# Patient Record
Sex: Male | Born: 1940 | Race: White | Hispanic: No | Marital: Single | Smoking: Former smoker
Health system: Southern US, Community
[De-identification: ages and names within clinical notes are randomized; demographics above are authoritative.]

## PROBLEM LIST (undated history)

## (undated) DIAGNOSIS — I219 Acute myocardial infarction, unspecified: Secondary | ICD-10-CM

## (undated) DIAGNOSIS — L814 Other melanin hyperpigmentation: Secondary | ICD-10-CM

## (undated) DIAGNOSIS — J189 Pneumonia, unspecified organism: Secondary | ICD-10-CM

## (undated) DIAGNOSIS — F32A Depression, unspecified: Secondary | ICD-10-CM

## (undated) DIAGNOSIS — I6529 Occlusion and stenosis of unspecified carotid artery: Secondary | ICD-10-CM

## (undated) DIAGNOSIS — I517 Cardiomegaly: Secondary | ICD-10-CM

## (undated) DIAGNOSIS — F329 Major depressive disorder, single episode, unspecified: Secondary | ICD-10-CM

## (undated) DIAGNOSIS — E785 Hyperlipidemia, unspecified: Secondary | ICD-10-CM

## (undated) DIAGNOSIS — I2510 Atherosclerotic heart disease of native coronary artery without angina pectoris: Secondary | ICD-10-CM

## (undated) DIAGNOSIS — D649 Anemia, unspecified: Secondary | ICD-10-CM

## (undated) DIAGNOSIS — R29898 Other symptoms and signs involving the musculoskeletal system: Secondary | ICD-10-CM

## (undated) HISTORY — PX: ABDOMINAL AORTAGRAM: SHX5706

## (undated) HISTORY — PX: COLONOSCOPY: SHX174

## (undated) HISTORY — DX: Occlusion and stenosis of unspecified carotid artery: I65.29

## (undated) HISTORY — DX: Hyperlipidemia, unspecified: E78.5

## (undated) HISTORY — DX: Acute myocardial infarction, unspecified: I21.9

---

## 1997-09-07 ENCOUNTER — Inpatient Hospital Stay (HOSPITAL_COMMUNITY): Admission: EM | Admit: 1997-09-07 | Discharge: 1997-09-11 | Payer: Self-pay | Admitting: Emergency Medicine

## 1997-09-19 ENCOUNTER — Ambulatory Visit (HOSPITAL_COMMUNITY): Admission: RE | Admit: 1997-09-19 | Discharge: 1997-09-19 | Payer: Self-pay | Admitting: Gastroenterology

## 1998-11-09 ENCOUNTER — Ambulatory Visit: Admission: RE | Admit: 1998-11-09 | Discharge: 1998-11-09 | Payer: Self-pay | Admitting: Pulmonary Disease

## 1998-11-14 ENCOUNTER — Ambulatory Visit (HOSPITAL_COMMUNITY): Admission: RE | Admit: 1998-11-14 | Discharge: 1998-11-14 | Payer: Self-pay | Admitting: Cardiology

## 1998-11-14 ENCOUNTER — Encounter: Payer: Self-pay | Admitting: Cardiology

## 1999-01-19 ENCOUNTER — Ambulatory Visit (HOSPITAL_COMMUNITY): Admission: RE | Admit: 1999-01-19 | Discharge: 1999-01-19 | Payer: Self-pay | Admitting: Neurosurgery

## 1999-01-19 ENCOUNTER — Encounter: Payer: Self-pay | Admitting: Specialist

## 1999-10-14 ENCOUNTER — Ambulatory Visit (HOSPITAL_COMMUNITY): Admission: RE | Admit: 1999-10-14 | Discharge: 1999-10-14 | Payer: Self-pay | Admitting: Urology

## 2006-12-09 ENCOUNTER — Emergency Department (HOSPITAL_COMMUNITY): Admission: EM | Admit: 2006-12-09 | Discharge: 2006-12-09 | Payer: Self-pay | Admitting: Emergency Medicine

## 2010-03-18 ENCOUNTER — Encounter: Payer: Self-pay | Admitting: Internal Medicine

## 2010-07-12 NOTE — Op Note (Signed)
[place]  Patient:    Daniel Tyler, Daniel Tyler                       MRN: [id number] Proc. Date: 10/14/99 Adm. Date:  [id number] Disc. Date: [id number] Attending:  Liborio Nixon                           Operative Report  PREOPERATIVE DIAGNOSIS:  Phimosis.  POSTOPERATIVE DIAGNOSIS:  Phimosis.  PROCEDURE:  Circumcision.  SURGEON:  Bertram Millard. Dahlstedt, [name]  ANESTHESIA:  MAC with local.  BRIEF HISTORY:  70 year old male with recurrent balanitis and resultant phimosis.  He cannot retract his foreskin now and has significant difficulty with pain and irritation.  The patient was referred to my office and I recommended circumcision as primary treatment for his phimosis and recurrent balanitis.  Risks and complications as well as alternatives to this procedure were discussed.  The patient desires to proceed.  DESCRIPTION OF PROCEDURE:  The patient was administered IV sedation and dorsal penile block using 20 cc of 50/50 solution of 1% Plain Lidocaine and .5% Plain Marcaine was introduced.  An excellent block was achieved.  A dorsal slit was performed to be able to retract his foreskin.  Proximal and distal circumcising incisions were made on the patients foreskin and the foreskin removed.  Small bleeders were taken care of with electrocautery.  A U-stitch was placed in the ventral aspect of the circumcision reapproximating the ventral incision.  Quadrant sutures of 4-0 chromic were also placed in simple interrupted fashion.  Between these, the 4-0 chromic was run in a simple running fashion to reapproximate the incision.  Adequate hemostasis was achieved.  A dressing of Vaseline gauze, Kerlix and Coban was placed.  The patient tolerated the procedure well.  Estimated blood loss was 10 cc.  He was taken to the post anesthesia care unit in stable condition. DD:  10/14/99 TD:  10/14/99 Job: [id number]

## 2010-08-20 ENCOUNTER — Other Ambulatory Visit: Payer: Self-pay | Admitting: Family Medicine

## 2010-08-20 DIAGNOSIS — R55 Syncope and collapse: Secondary | ICD-10-CM

## 2010-12-04 LAB — DIFFERENTIAL
Basophils Absolute: 0.1
Basophils Relative: 1
Eosinophils Absolute: 0.2
Eosinophils Relative: 2
Monocytes Absolute: 0.8 — ABNORMAL HIGH
Neutro Abs: 9.9 — ABNORMAL HIGH

## 2010-12-04 LAB — I-STAT 8, (EC8 V) (CONVERTED LAB)
Bicarbonate: 21.5
Glucose, Bld: 218 — ABNORMAL HIGH
Hemoglobin: 15
Operator id: 270111
Sodium: 136
TCO2: 23

## 2010-12-04 LAB — POCT I-STAT CREATININE
Creatinine, Ser: 0.8
Operator id: 270111

## 2010-12-04 LAB — CBC
Hemoglobin: 13.7
MCHC: 34.4
RDW: 13.9

## 2011-02-25 DIAGNOSIS — I219 Acute myocardial infarction, unspecified: Secondary | ICD-10-CM

## 2011-02-25 DIAGNOSIS — J189 Pneumonia, unspecified organism: Secondary | ICD-10-CM

## 2011-02-25 HISTORY — PX: CARDIAC CATHETERIZATION: SHX172

## 2011-02-25 HISTORY — DX: Pneumonia, unspecified organism: J18.9

## 2011-02-25 HISTORY — DX: Acute myocardial infarction, unspecified: I21.9

## 2011-04-14 ENCOUNTER — Other Ambulatory Visit: Payer: Self-pay

## 2011-04-14 ENCOUNTER — Encounter (HOSPITAL_COMMUNITY): Payer: Self-pay

## 2011-04-14 ENCOUNTER — Emergency Department (HOSPITAL_COMMUNITY): Payer: Medicare Other

## 2011-04-14 ENCOUNTER — Inpatient Hospital Stay (HOSPITAL_COMMUNITY)
Admission: EM | Admit: 2011-04-14 | Discharge: 2011-04-26 | DRG: 871 | Disposition: A | Discharge status: Skilled Nursing Facility | Payer: Medicare Other | Attending: Family Medicine | Admitting: Family Medicine

## 2011-04-14 DIAGNOSIS — E46 Unspecified protein-calorie malnutrition: Secondary | ICD-10-CM | POA: Diagnosis present

## 2011-04-14 DIAGNOSIS — R197 Diarrhea, unspecified: Secondary | ICD-10-CM | POA: Diagnosis present

## 2011-04-14 DIAGNOSIS — R296 Repeated falls: Secondary | ICD-10-CM | POA: Diagnosis present

## 2011-04-14 DIAGNOSIS — E876 Hypokalemia: Secondary | ICD-10-CM | POA: Diagnosis present

## 2011-04-14 DIAGNOSIS — I214 Non-ST elevation (NSTEMI) myocardial infarction: Secondary | ICD-10-CM | POA: Diagnosis not present

## 2011-04-14 DIAGNOSIS — I2510 Atherosclerotic heart disease of native coronary artery without angina pectoris: Secondary | ICD-10-CM | POA: Diagnosis present

## 2011-04-14 DIAGNOSIS — Z794 Long term (current) use of insulin: Secondary | ICD-10-CM

## 2011-04-14 DIAGNOSIS — J4489 Other specified chronic obstructive pulmonary disease: Secondary | ICD-10-CM | POA: Diagnosis present

## 2011-04-14 DIAGNOSIS — E119 Type 2 diabetes mellitus without complications: Secondary | ICD-10-CM | POA: Diagnosis present

## 2011-04-14 DIAGNOSIS — Y92009 Unspecified place in unspecified non-institutional (private) residence as the place of occurrence of the external cause: Secondary | ICD-10-CM

## 2011-04-14 DIAGNOSIS — I509 Heart failure, unspecified: Secondary | ICD-10-CM | POA: Diagnosis present

## 2011-04-14 DIAGNOSIS — J690 Pneumonitis due to inhalation of food and vomit: Secondary | ICD-10-CM | POA: Diagnosis present

## 2011-04-14 DIAGNOSIS — D649 Anemia, unspecified: Secondary | ICD-10-CM | POA: Diagnosis present

## 2011-04-14 DIAGNOSIS — J449 Chronic obstructive pulmonary disease, unspecified: Secondary | ICD-10-CM | POA: Diagnosis present

## 2011-04-14 DIAGNOSIS — A419 Sepsis, unspecified organism: Principal | ICD-10-CM | POA: Diagnosis present

## 2011-04-14 DIAGNOSIS — E118 Type 2 diabetes mellitus with unspecified complications: Secondary | ICD-10-CM

## 2011-04-14 DIAGNOSIS — R5381 Other malaise: Secondary | ICD-10-CM | POA: Diagnosis present

## 2011-04-14 DIAGNOSIS — M6282 Rhabdomyolysis: Secondary | ICD-10-CM

## 2011-04-14 DIAGNOSIS — J189 Pneumonia, unspecified organism: Secondary | ICD-10-CM | POA: Diagnosis present

## 2011-04-14 DIAGNOSIS — R531 Weakness: Secondary | ICD-10-CM

## 2011-04-14 DIAGNOSIS — Y998 Other external cause status: Secondary | ICD-10-CM

## 2011-04-14 DIAGNOSIS — R739 Hyperglycemia, unspecified: Secondary | ICD-10-CM

## 2011-04-14 DIAGNOSIS — I2589 Other forms of chronic ischemic heart disease: Secondary | ICD-10-CM | POA: Diagnosis present

## 2011-04-14 DIAGNOSIS — I498 Other specified cardiac arrhythmias: Secondary | ICD-10-CM | POA: Diagnosis present

## 2011-04-14 DIAGNOSIS — J9600 Acute respiratory failure, unspecified whether with hypoxia or hypercapnia: Secondary | ICD-10-CM | POA: Diagnosis present

## 2011-04-14 DIAGNOSIS — R748 Abnormal levels of other serum enzymes: Secondary | ICD-10-CM

## 2011-04-14 DIAGNOSIS — M542 Cervicalgia: Secondary | ICD-10-CM | POA: Diagnosis present

## 2011-04-14 HISTORY — DX: Cardiomegaly: I51.7

## 2011-04-14 LAB — INFLUENZA PANEL BY PCR (TYPE A & B)
H1N1 flu by pcr: NOT DETECTED
Influenza A By PCR: NEGATIVE
Influenza B By PCR: NEGATIVE

## 2011-04-14 LAB — CARDIAC PANEL(CRET KIN+CKTOT+MB+TROPI)
CK, MB: 4.7 ng/mL — ABNORMAL HIGH (ref 0.3–4.0)
Relative Index: 0.4 (ref 0.0–2.5)
Relative Index: 0.3 (ref 0.0–2.5)
Total CK: 1466 U/L — ABNORMAL HIGH (ref 7–232)
Troponin I: 2.15 ng/mL

## 2011-04-14 LAB — BASIC METABOLIC PANEL
BUN: 24 mg/dL — ABNORMAL HIGH (ref 6–23)
CO2: 20 mEq/L (ref 19–32)
Chloride: 90 mEq/L — ABNORMAL LOW (ref 96–112)
Creatinine, Ser: 0.99 mg/dL (ref 0.50–1.35)

## 2011-04-14 LAB — CBC
HCT: 38.9 % — ABNORMAL LOW (ref 39.0–52.0)
HCT: 34.1 % — ABNORMAL LOW (ref 39.0–52.0)
Hemoglobin: 11.5 g/dL — ABNORMAL LOW (ref 13.0–17.0)
MCH: 29.2 pg (ref 26.0–34.0)
MCHC: 33.7 g/dL (ref 30.0–36.0)
MCV: 86.8 fL (ref 78.0–100.0)
MCV: 86.5 fL (ref 78.0–100.0)
Platelets: 266 K/uL (ref 150–400)
RBC: 4.48 MIL/uL (ref 4.22–5.81)
RBC: 3.94 MIL/uL — ABNORMAL LOW (ref 4.22–5.81)
RDW: 13.9 % (ref 11.5–15.5)
WBC: 10.6 10*3/uL — ABNORMAL HIGH (ref 4.0–10.5)
WBC: 9 K/uL (ref 4.0–10.5)

## 2011-04-14 LAB — CREATININE, SERUM
Creatinine, Ser: 0.86 mg/dL (ref 0.50–1.35)
GFR calc Af Amer: >90 mL/min
GFR calc non Af Amer: 86 mL/min — ABNORMAL LOW

## 2011-04-14 LAB — POCT I-STAT 3, ART BLOOD GAS (G3+)
Bicarbonate: 17.7 mEq/L — ABNORMAL LOW (ref 20.0–24.0)
TCO2: 18 mmol/L (ref 0–100)
pCO2 arterial: 27.5 mmHg — ABNORMAL LOW (ref 35.0–45.0)
pH, Arterial: 7.416 (ref 7.350–7.450)
pO2, Arterial: 80 mmHg (ref 80.0–100.0)

## 2011-04-14 LAB — GLUCOSE, CAPILLARY: Glucose-Capillary: 479 mg/dL — ABNORMAL HIGH (ref 70–99)

## 2011-04-14 LAB — URINE MICROSCOPIC-ADD ON

## 2011-04-14 LAB — URINALYSIS, ROUTINE W REFLEX MICROSCOPIC
Glucose, UA: >1000 mg/dL — AB
Protein, ur: 100 mg/dL — AB

## 2011-04-14 LAB — PROCALCITONIN: Procalcitonin: 29.92 ng/mL

## 2011-04-14 MED ORDER — INSULIN GLARGINE 100 UNIT/ML ~~LOC~~ SOLN
5.0000 [IU] | Freq: Every day | SUBCUTANEOUS | Status: DC
  Administered 2011-04-14: 5 [IU] via SUBCUTANEOUS
  Filled 2011-04-14: qty 1

## 2011-04-14 MED ORDER — PIPERACILLIN-TAZOBACTAM 3.375 G IVPB
3.3750 g | Freq: Three times a day (TID) | INTRAVENOUS | Status: DC
  Administered 2011-04-15 – 2011-04-19 (×13): 3.375 g via INTRAVENOUS
  Filled 2011-04-14 (×16): qty 50

## 2011-04-14 MED ORDER — VANCOMYCIN HCL IN DEXTROSE 1-5 GM/200ML-% IV SOLN
1000.0000 mg | Freq: Once | INTRAVENOUS | Status: AC
  Administered 2011-04-14: 1000 mg via INTRAVENOUS
  Filled 2011-04-14: qty 200

## 2011-04-14 MED ORDER — ACETAMINOPHEN 650 MG RE SUPP: 650.0000 mg | Freq: Four times a day (QID) | RECTAL | Status: DC | PRN

## 2011-04-14 MED ORDER — ALBUTEROL SULFATE (5 MG/ML) 0.5% IN NEBU
2.5000 mg | INHALATION_SOLUTION | RESPIRATORY_TRACT | Status: DC | PRN
  Administered 2011-04-15 – 2011-04-21 (×3): 2.5 mg via RESPIRATORY_TRACT
  Filled 2011-04-14 (×3): qty 0.5

## 2011-04-14 MED ORDER — HEPARIN SODIUM (PORCINE) 5000 UNIT/ML IJ SOLN
5000.0000 [IU] | Freq: Three times a day (TID) | INTRAMUSCULAR | Status: DC
  Administered 2011-04-14: 5000 [IU] via SUBCUTANEOUS
  Filled 2011-04-14 (×2): qty 1

## 2011-04-14 MED ORDER — GUAIFENESIN ER 600 MG PO TB12
600.0000 mg | ORAL_TABLET | Freq: Two times a day (BID) | ORAL | Status: DC
  Filled 2011-04-14 (×4): qty 1

## 2011-04-14 MED ORDER — ACETAMINOPHEN 500 MG PO TABS
1000.0000 mg | ORAL_TABLET | ORAL | Status: AC
  Administered 2011-04-14: 1000 mg via ORAL
  Filled 2011-04-14: qty 2

## 2011-04-14 MED ORDER — SODIUM CHLORIDE 0.9 % IV BOLUS (SEPSIS)
1000.0000 mL | Freq: Once | INTRAVENOUS | Status: AC
  Administered 2011-04-14: 1000 mL via INTRAVENOUS

## 2011-04-14 MED ORDER — VANCOMYCIN HCL IN DEXTROSE 1-5 GM/200ML-% IV SOLN
1000.0000 mg | INTRAVENOUS | Status: DC
  Filled 2011-04-14: qty 200

## 2011-04-14 MED ORDER — INSULIN ASPART 100 UNIT/ML ~~LOC~~ SOLN
0.0000 [IU] | Freq: Every day | SUBCUTANEOUS | Status: DC
  Administered 2011-04-14: 9 [IU] via SUBCUTANEOUS
  Filled 2011-04-14: qty 1

## 2011-04-14 MED ORDER — ASPIRIN EC 81 MG PO TBEC
81.0000 mg | DELAYED_RELEASE_TABLET | Freq: Every day | ORAL | Status: DC
  Administered 2011-04-15: 81 mg via ORAL
  Filled 2011-04-14 (×3): qty 1

## 2011-04-14 MED ORDER — ACETAMINOPHEN 325 MG PO TABS: 650.0000 mg | ORAL_TABLET | Freq: Four times a day (QID) | ORAL | Status: DC | PRN

## 2011-04-14 MED ORDER — INSULIN ASPART 100 UNIT/ML ~~LOC~~ SOLN: 0.0000 [IU] | Freq: Three times a day (TID) | SUBCUTANEOUS | Status: DC

## 2011-04-14 MED ORDER — SODIUM CHLORIDE 0.9 % IV SOLN
INTRAVENOUS | Status: DC
  Administered 2011-04-14: 22:00:00 via INTRAVENOUS

## 2011-04-14 MED ORDER — BIOTENE DRY MOUTH MT LIQD
15.0000 mL | Freq: Two times a day (BID) | OROMUCOSAL | Status: DC
  Administered 2011-04-15: 15 mL via OROMUCOSAL

## 2011-04-14 MED ORDER — PIPERACILLIN-TAZOBACTAM 3.375 G IVPB
3.3750 g | Freq: Once | INTRAVENOUS | Status: AC
  Administered 2011-04-14: 3.375 g via INTRAVENOUS
  Filled 2011-04-14: qty 50

## 2011-04-14 NOTE — ED Notes (Signed)
Assumed care for lunch break at this time 

## 2011-04-14 NOTE — Consult Note (Signed)
Cardiology IP Consult  Reason for Consult: elevated troponin Referring Physician: Arline Asp  HPI: Daniel Tyler is a 71 y.o.male with history of DM on oral medications and insulin therapy, COPD noted on CXR, and a history of an "enlarged heart" for unclear reasons but Pt denies history of myocardial infarction, admissions for congestive heart failure, prior cardiac catheterization, or cardiac surgery who presents to the ER with malaise and productive cough with CC of "I have pneumonia."  Pt reports 4-5 days of progressively feeling poorly, +malaise, decreased PO intake/appetite, worsening productive cough of thick yellowish sputum, shortness of breath, and a mechanical fall today 2/2 weakness with inability to get up. Pt denies any recent or remote history of chest pain, no syncope, no palpitations, no PND or LEE.  He reports some mild nausea and episode of emesis.  He reports independence in his activities of daily living but does not appear to be very active at home but does deny any exertional symptoms.  Pt was evaluated in the ER, was tachycardic with borderline low blood pressures concerning for sepsis physiology and was initiated antibiotics for presumed pneumonia and IVF resuscitation with planned admission to step down unit by internal medicine. Cardiology was consulted for mildly elevated troponin.    Past Medical History  Diagnosis Date  . Diabetes mellitus   . Enlarged heart     Family History: no premature CAD  Social History: very remote history of tobacco use (quit smoking 30+ years ago), no alcohol, lives alone  Allergies: No Known Allergies  Current Facility-Administered Medications  Medication Dose Route Frequency Provider Last Rate Last Dose  . 0.9 %  sodium chloride infusion   Intravenous Continuous Clementeen Graham, MD 150 mL/hr at 04/14/11 2157    . acetaminophen (TYLENOL) tablet 650 mg  650 mg Oral Q6H PRN Clementeen Graham, MD       Or  . acetaminophen (TYLENOL) suppository 650 mg   650 mg Rectal Q6H PRN Clementeen Graham, MD      . acetaminophen (TYLENOL) tablet 1,000 mg  1,000 mg Oral To Major Ethelda Chick, MD   1,000 mg at 04/14/11 1824  . albuterol (PROVENTIL) (5 MG/ML) 0.5% nebulizer solution 2.5 mg  2.5 mg Nebulization Q2H PRN Clementeen Graham, MD      . aspirin EC tablet 81 mg  81 mg Oral Daily Clementeen Graham, MD      . guaiFENesin (MUCINEX) 12 hr tablet 600 mg  600 mg Oral BID Clementeen Graham, MD      . heparin injection 5,000 Units  5,000 Units Subcutaneous Q8H Clementeen Graham, MD      . insulin aspart (novoLOG) injection 0-5 Units  0-5 Units Subcutaneous QHS Clementeen Graham, MD      . insulin aspart (novoLOG) injection 0-9 Units  0-9 Units Subcutaneous TID WC Clementeen Graham, MD      . insulin glargine (LANTUS) injection 5 Units  5 Units Subcutaneous QHS Clementeen Graham, MD      . piperacillin-tazobactam (ZOSYN) IVPB 3.375 g  3.375 g Intravenous Once Ethelda Chick, MD   3.375 g at 04/14/11 1825  . sodium chloride 0.9 % bolus 1,000 mL  1,000 mL Intravenous Once Ethelda Chick, MD   1,000 mL at 04/14/11 1813  . sodium chloride 0.9 % bolus 1,000 mL  1,000 mL Intravenous Once Ethelda Chick, MD   1,000 mL at 04/14/11 1943  . vancomycin (VANCOCIN) IVPB 1000 mg/200 mL premix  1,000 mg Intravenous Once Ethelda Chick, MD  1,000 mg at 04/14/11 1903   Current Outpatient Prescriptions  Medication Sig Dispense Refill  . acetaminophen (TYLENOL) 500 MG tablet Take 1,000 mg by mouth every 6 (six) hours as needed. For pain      . aspirin 325 MG tablet Take 975 mg by mouth every 6 (six) hours as needed. For pain      . glipiZIDE (GLUCOTROL) 10 MG tablet Take 10 mg by mouth 2 (two) times daily before a meal.      . insulin glargine (LANTUS) 100 UNIT/ML injection Inject 15 Units into the skin at bedtime.      . metFORMIN (GLUCOPHAGE) 1000 MG tablet Take 1,000 mg by mouth 2 (two) times daily with a meal.        ROS: A full review of systems is obtained and is negative except as noted in the HPI.  Physical  Exam: Blood pressure 108/74, pulse 99, temperature 97.6 F (36.4 C), temperature source Axillary, resp. rate 24, height 5\' 9"  (1.753 m), weight 160 lb (72.576 kg), SpO2 97.00%.  GENERAL: very mild respiratory distress, disheveled WM EYES: Extra ocular movements are intact. There is no lid lag. Sclera is anicteric.  ENT: Oropharynx is clear, edentulous, MMM  NECK: Supple, no evidence of JVD, no carotid bruits  HEART: RRR with frequent ectopy, distant S1, S2, very soft I/VI SM, no S3 or S4, PMI not palpable LUNGS: coarse BS B/L, crackles R>L, + wheezes, dullness to percussion of RLL with decreased tactile fremitus, prolonged expiratory phase and diffusely decreased BS ABDOMEN: Soft, non-tender, and non-distended with hypoactive bowel sounds. There is no hepatosplenomegaly.  EXTREMITIES: No clubbing, cyanosis, or edema; B/L LE muscle atrophy PULSES: Carotids were +2 and equal bilaterally with no bruits. Equal peripheral pulses SKIN: Warm, dry, and intact.  NEUROLOGIC: The patient was oriented to person, place, and time. No overt neurologic deficits were detected.  PSYCH: Normal judgment and insight, mood is appropriate.   Results: Results for orders placed during the hospital encounter of 04/14/11 (from the past 24 hour(s))  CBC     Status: Abnormal   Collection Time   04/14/11  5:48 PM      Component Value Range   WBC 10.6 (*) 4.0 - 10.5 (K/uL)   RBC 4.48  4.22 - 5.81 (MIL/uL)   Hemoglobin 13.6  13.0 - 17.0 (g/dL)   HCT 16.1 (*) 09.6 - 52.0 (%)   MCV 86.8  78.0 - 100.0 (fL)   MCH 30.4  26.0 - 34.0 (pg)   MCHC 35.0  30.0 - 36.0 (g/dL)   RDW 04.5  40.9 - 81.1 (%)   Platelets 316  150 - 400 (K/uL)  BASIC METABOLIC PANEL     Status: Abnormal   Collection Time   04/14/11  5:48 PM      Component Value Range   Sodium 130 (*) 135 - 145 (mEq/L)   Potassium 4.4  3.5 - 5.1 (mEq/L)   Chloride 90 (*) 96 - 112 (mEq/L)   CO2 20  19 - 32 (mEq/L)   Glucose, Bld 485 (*) 70 - 99 (mg/dL)   BUN 24 (*)  6 - 23 (mg/dL)   Creatinine, Ser 9.14  0.50 - 1.35 (mg/dL)   Calcium 78.2  8.4 - 10.5 (mg/dL)   GFR calc non Af Amer 81 (*) >90 (mL/min)   GFR calc Af Amer >90  >90 (mL/min)  LACTIC ACID, PLASMA     Status: Abnormal   Collection Time   04/14/11  6:10 PM  Component Value Range   Lactic Acid, Venous 4.5 (*) 0.5 - 2.2 (mmol/L)  PROCALCITONIN     Status: Normal   Collection Time   04/14/11  6:10 PM      Component Value Range   Procalcitonin 29.92    CARDIAC PANEL(CRET KIN+CKTOT+MB+TROPI)     Status: Abnormal   Collection Time   04/14/11  6:10 PM      Component Value Range   Total CK 1282 (*) 7 - 232 (U/L)   CK, MB 5.5 (*) 0.3 - 4.0 (ng/mL)   Troponin I 1.74 (*) <0.30 (ng/mL)   Relative Index 0.4  0.0 - 2.5   URINALYSIS, ROUTINE W REFLEX MICROSCOPIC     Status: Abnormal   Collection Time   04/14/11  6:33 PM      Component Value Range   Color, Urine YELLOW  YELLOW    APPearance CLEAR  CLEAR    Specific Gravity, Urine 1.030  1.005 - 1.030    pH 5.0  5.0 - 8.0    Glucose, UA >1000 (*) NEGATIVE (mg/dL)   Hgb urine dipstick LARGE (*) NEGATIVE    Bilirubin Urine NEGATIVE  NEGATIVE    Ketones, ur 15 (*) NEGATIVE (mg/dL)   Protein, ur 409 (*) NEGATIVE (mg/dL)   Urobilinogen, UA 0.2  0.0 - 1.0 (mg/dL)   Nitrite NEGATIVE  NEGATIVE    Leukocytes, UA NEGATIVE  NEGATIVE   URINE MICROSCOPIC-ADD ON     Status: Normal   Collection Time   04/14/11  6:33 PM      Component Value Range   Squamous Epithelial / LPF RARE  RARE    RBC / HPF 0-2  <3 (RBC/hpf)   Bacteria, UA RARE  RARE   INFLUENZA PANEL BY PCR     Status: Normal   Collection Time   04/14/11  6:48 PM      Component Value Range   Influenza A By PCR NEGATIVE  NEGATIVE    Influenza B By PCR NEGATIVE  NEGATIVE    H1N1 flu by pcr NOT DETECTED  NOT DETECTED   POCT I-STAT 3, BLOOD GAS (G3+)     Status: Abnormal   Collection Time   04/14/11  9:58 PM      Component Value Range   pH, Arterial 7.416  7.350 - 7.450    pCO2 arterial 27.5  (*) 35.0 - 45.0 (mmHg)   pO2, Arterial 80.0  80.0 - 100.0 (mmHg)   Bicarbonate 17.7 (*) 20.0 - 24.0 (mEq/L)   TCO2 18  0 - 100 (mmol/L)   O2 Saturation 96.0     Acid-base deficit 6.0 (*) 0.0 - 2.0 (mmol/L)   Collection site RADIAL, ALLEN'S TEST ACCEPTABLE     Drawn by RT     Sample type ARTERIAL      CXR: COPD. Right lower lobe infiltrate, suspicious for pneumonia.  EKG: technically poor quality ECG, probable sinus tachycardia with frequent PACs (vs multifocal atrial tachycardia), poor R wave progression (likely 2/2 COPD), nonspecific ST-T changes  Assessment/Plan: 70 y.o.male with history of DM on oral medications and insulin therapy, COPD noted on CXR, and a history of an "enlarged heart" for unclear reasons but Pt denies history of myocardial infarction, admissions for congestive heart failure, prior cardiac catheterization, or cardiac surgery admitted with clinical history of malaise, weakness, decreased PO intake, productive cough of thick yellow sputum, fever of 101.8, CXR with RLL infiltrate consistent with pneumonia with a mild troponin elevation of 1.74 in the absence  of specific ECG changes or chest pain which is NOT consistent with acute coronary syndrome.  Troponin elevation is only indicative of myocardial cell necrosis and is not specific for cardiac ischemia and in this clinical scenario is representative of demand ischemia compounded by mild rhabdomyolysis.  --there is no indication for therapeutic heparin anticoagulation at this time  --reasonable to trend cardiac markers until peak   --would risk stratify with lipid panel and HgbA1C  --repeat ECG to exclude multifocal atrial tachycardia  --would monitor on telemetry during acute pneumonia tx  --would obtain TTE after improvement from pneumonia  --would initiate aspirin 81mg  daily for primary prevention  --agree with step-down unit and medicine admission  --will defer treatment of PNA and noncardiac medical problems to  primary team  --appreciate consult, please call with questions  --please call if clinical status changes    Isahia Hollerbach 04/14/2011, 10:13 PM

## 2011-04-14 NOTE — ED Notes (Signed)
[id number] Ready 

## 2011-04-14 NOTE — ED Notes (Signed)
Pts CBG 479

## 2011-04-14 NOTE — ED Provider Notes (Signed)
History     CSN: [id number]  Arrival date & time 04/14/11  1723   First MD Initiated Contact with Patient 04/14/11 1728      Chief Complaint  Patient presents with  . Weakness    Pt has felt weak, chills, coughing up yellow sputum, fell, down for 4 hours no LOC, pt told EMS he did not have energy to get up.  Pt had CBG of 475, pt has not taken meds today.  Pt was 80% with EMS, severe sore throat, pt does not wear home O2.    (Consider location/radiation/quality/duration/timing/severity/associated sxs/prior treatment) HPI A LEVEL 5 CAVEAT PERTAINS DUE TO URGENT NEED FOR INTERVENTION Patient presents with generalized weakness as well as cough productive of thick sputum, fever and some shortness of breath. Symptoms began approximately 4-5 days ago and has been worsening. He has not been eating and drinking as much as his usual. He fell earlier today while he was trying to put on his pants and states that he felt generally weak causing him to fall. He denies striking his head. He states he has chronic back pain that is no worse after the fall. He does state that he was on the floor at his house for several hours prior to family member arriving and prompting EMS transport to the ED period  There are no other associated systemic symptoms.  Exertion makes symptoms worse, there is nothing that makes them better.   Of note, history is limited due to patient being a poor historian  Past Medical History  Diagnosis Date  . Diabetes mellitus   . Enlarged heart     History reviewed. No pertinent past surgical history.  History reviewed. No pertinent family history.  History  Substance Use Topics  . Smoking status: Never Smoker   . Smokeless tobacco: Not on file  . Alcohol Use: No      Review of Systems ROS reviewed and otherwise negative except for mentioned in HPI  Allergies  Review of patient's allergies indicates no known allergies.  Home Medications  No current outpatient  prescriptions on file.  BP 139/70  Pulse 107  Temp(Src) 98.2 F (36.8 C) (Oral)  Resp 27  Ht 5\' 9"  (1.753 m)  Wt 143 lb 11.8 oz (65.2 kg)  BMI 21.23 kg/m2  SpO2 95% Vitals reviewed Physical Exam Physical Examination: General appearance - alert, ill appearing, and in no acute distress Mental status - alert, oriented to person, place, and time Mouth - MM dry, OP without lesions Chest - coarse rhonchi throughout bilateral lung fields, no increased work of breathing, breath sounds symmetric Heart - normal rate, regular rhythm, normal S1, S2, no murmurs, rubs, clicks or gallops Abdomen - soft, nontender, nondistended, no masses or organomegaly Neurological - alert, oriented, normal speech, moving all extremities well, sensation intact Musculoskeletal - no joint tenderness, deformity or swelling, FROM without pain Back- no midline tenderness of lumbar spine, mild ttp over cervical spine- c-collar in place Extremities - peripheral pulses normal, no pedal edema, no clubbing or cyanosis Skin - normal coloration and turgor, no rashes  ED Course  Procedures (including critical care time)  7:54 PM d/w PCCM, Dr. Delford Field- he will send ICU consultant to evaluate patient in the ED  8:07 PM pt now has a second IV line, 2nd bolus of fluids is running.  Critical care is at bedside now  8:45 PM Critical care has evaluated patient, they state he is stable enough for step down bed- I have discussed  with family practice resident who will see patient for admission.  Pt and family updated at bedside about results and plan for admission   Date: 04/14/2011  Rate: 112  Rhythm: sinus tachycardia  QRS Axis: normal  Intervals: normal borderline prolonged QT  ST/T Wave abnormalities: normal  Conduction Disutrbances:none  Narrative Interpretation:   Old EKG Reviewed: none available  CRITICAL CARE Performed by: Ethelda Chick   Total critical care time: 45  Critical care time was exclusive of  separately billable procedures and treating other patients.  Critical care was necessary to treat or prevent imminent or life-threatening deterioration.  Critical care was time spent personally by me on the following activities: development of treatment plan with patient and/or surrogate as well as nursing, discussions with consultants, evaluation of patient's response to treatment, examination of patient, obtaining history from patient or surrogate, ordering and performing treatments and interventions, ordering and review of laboratory studies, ordering and review of radiographic studies, pulse oximetry and re-evaluation of patient's condition.  Labs Reviewed  CBC - Abnormal; Notable for the following:    WBC 10.6 (*)    HCT 38.9 (*)    All other components within normal limits  BASIC METABOLIC PANEL - Abnormal; Notable for the following:    Sodium 130 (*)    Chloride 90 (*)    Glucose, Bld 485 (*)    BUN 24 (*)    GFR calc non Af Amer 81 (*)    All other components within normal limits  URINALYSIS, ROUTINE W REFLEX MICROSCOPIC - Abnormal; Notable for the following:    Glucose, UA >1000 (*)    Hgb urine dipstick LARGE (*)    Ketones, ur 15 (*)    Protein, ur 100 (*)    All other components within normal limits  LACTIC ACID, PLASMA - Abnormal; Notable for the following:    Lactic Acid, Venous 4.5 (*)    All other components within normal limits  CARDIAC PANEL(CRET KIN+CKTOT+MB+TROPI) - Abnormal; Notable for the following:    Total CK 1282 (*)    CK, MB 5.5 (*)    Troponin I 1.74 (*)    All other components within normal limits  CBC - Abnormal; Notable for the following:    RBC 3.94 (*)    Hemoglobin 11.5 (*)    HCT 34.1 (*)    All other components within normal limits  CREATININE, SERUM - Abnormal; Notable for the following:    GFR calc non Af Amer 86 (*)    All other components within normal limits  CARDIAC PANEL(CRET KIN+CKTOT+MB+TROPI) - Abnormal; Notable for the following:     Total CK 1466 (*)    CK, MB 4.7 (*)    Troponin I 2.15 (*)    All other components within normal limits  POCT I-STAT 3, BLOOD GAS (G3+) - Abnormal; Notable for the following:    pCO2 arterial 27.5 (*)    Bicarbonate 17.7 (*)    Acid-base deficit 6.0 (*)    All other components within normal limits  GLUCOSE, CAPILLARY - Abnormal; Notable for the following:    Glucose-Capillary 479 (*)    All other components within normal limits  PROCALCITONIN  INFLUENZA PANEL BY PCR  URINE MICROSCOPIC-ADD ON  CULTURE, BLOOD (ROUTINE X 2)  CULTURE, BLOOD (ROUTINE X 2)  URINE CULTURE  BLOOD GAS, ARTERIAL  CBC  BASIC METABOLIC PANEL  CARDIAC PANEL(CRET KIN+CKTOT+MB+TROPI)  CARDIAC PANEL(CRET KIN+CKTOT+MB+TROPI)  HEMOGLOBIN A1C  MRSA PCR SCREENING   Ct Head  Wo Contrast  04/14/2011  *RADIOLOGY REPORT*  Clinical Data:  Weakness.  Fall  CT HEAD WITHOUT CONTRAST CT CERVICAL SPINE WITHOUT CONTRAST  Technique:  Multidetector CT imaging of the head and cervical spine was performed following the standard protocol without intravenous contrast.  Multiplanar CT image reconstructions of the cervical spine were also generated.  Comparison:   None  CT HEAD  Findings: Age appropriate atrophy.  Negative for hemorrhage. Negative for acute infarct or mass.  Chronic sinusitis with mucosal thickening in the paranasal sinuses. Calvarium is intact.  IMPRESSION: No acute intracranial abnormality.  Sinusitis.  CT CERVICAL SPINE  Findings: Negative for fracture.  Cervical disc degeneration and moderate to advanced spondylosis at C3-4, C4-5, and C5-6.  Milder spondylosis at C6-7.  Carotid atherosclerotic calcification is present.  IMPRESSION: Negative for fracture.  Original Report Authenticated By: Camelia Phenes, [name]   Ct Cervical Spine Wo Contrast  04/14/2011  *RADIOLOGY REPORT*  Clinical Data:  Weakness.  Fall  CT HEAD WITHOUT CONTRAST CT CERVICAL SPINE WITHOUT CONTRAST  Technique:  Multidetector CT imaging of the head and  cervical spine was performed following the standard protocol without intravenous contrast.  Multiplanar CT image reconstructions of the cervical spine were also generated.  Comparison:   None  CT HEAD  Findings: Age appropriate atrophy.  Negative for hemorrhage. Negative for acute infarct or mass.  Chronic sinusitis with mucosal thickening in the paranasal sinuses. Calvarium is intact.  IMPRESSION: No acute intracranial abnormality.  Sinusitis.  CT CERVICAL SPINE  Findings: Negative for fracture.  Cervical disc degeneration and moderate to advanced spondylosis at C3-4, C4-5, and C5-6.  Milder spondylosis at C6-7.  Carotid atherosclerotic calcification is present.  IMPRESSION: Negative for fracture.  Original Report Authenticated By: Camelia Phenes, [name]   Dg Chest Port 1 View  04/14/2011  *RADIOLOGY REPORT*  Clinical Data: Right-sided chest pain  PORTABLE CHEST - 1 VIEW  Comparison: None.  Findings: Right lower lobe airspace disease, possible pneumonia.  COPD with hyperinflation.  Apical emphysema.  No pleural effusion. Vascularity is normal.  IMPRESSION: COPD.  Right lower lobe infiltrate, suspicious for pneumonia.  Original Report Authenticated By: Camelia Phenes, [name]     1. Pneumonia   2. Severe sepsis   3. Rhabdomyolysis   4. Hyperglycemia   5. Weakness       MDM  Patient presenting with complaint of cough and increasing generalized weakness over the past 4-5 days. He and family number state that symptoms have been gradually worsening. On evaluation today in the ED he was found to be mildly hypoxic and placed on oxygen. An IV was initiated and patient was placed on the monitor. Chest x-ray showed signs of infiltrate consistent with pneumonia. He is mildly elevated white blood cell count. His percussive tenderness and lactate were both significantly elevated. He was started on broad-spectrum antibiotics after blood and urine cultures were obtained. Initially blood pressure was normal however  after observation in the ED began to decrease to a low point systolic blood pressure 95. Pulmonary critical care medicine was consult to do to concern for severe sepsis and they evaluated the patient in the ED period they agreed with management up to this point and the patient was stable enough for step down admission to medicine. Patient was discussed with family practice resident on call who evaluated the patient in the ED and are admitting him for further management.  Pt also had mild increase in CK and other cardiac markers which I  believe is c/w history of fall today with several hour downtime, could also be due to end organ damage from severe sepsis as well.         Ethelda Chick, MD 04/16/11 [phone]

## 2011-04-14 NOTE — ED Notes (Signed)
MD notified about Triponin levels.

## 2011-04-14 NOTE — ED Notes (Signed)
Attempted to call report; RN unable to take report at this time.

## 2011-04-14 NOTE — Progress Notes (Signed)
ANTIBIOTIC CONSULT NOTE - INITIAL  Pharmacy Consult for Vancomycin/Zosyn Indication: pneumonia and rule out sepsis  No Known Allergies  Patient Measurements: Height: 5\' 9"  (175.3 cm) Weight: 160 lb (72.576 kg) IBW/kg (Calculated) : 66.2   Vital Signs: Temp: 98 F (36.7 C) (02/18 2224) Temp src: Oral (02/18 2224) BP: 110/70 mmHg (02/18 2224) Pulse Rate: 97  (02/18 2224) Intake/Output from previous day:   Intake/Output from this shift:    Labs:  [facility] 04/14/11 1748  WBC 10.6*  HGB 13.6  PLT 316  LABCREA --  CREATININE 0.99   Estimated Creatinine Clearance: 69.4 ml/min (by C-G formula based on Cr of 0.99). No results found for this basename: VANCOTROUGH:2,VANCOPEAK:2,VANCORANDOM:2,GENTTROUGH:2,GENTPEAK:2,GENTRANDOM:2,TOBRATROUGH:2,TOBRAPEAK:2,TOBRARND:2,AMIKACINPEAK:2,AMIKACINTROU:2,AMIKACIN:2, in the last 72 hours   Microbiology: No results found for this or any previous visit (from the past 720 hour(s)).  Medical History: Past Medical History  Diagnosis Date  . Diabetes mellitus   . Enlarged heart     Medications:   (Not in a hospital admission) Assessment: 72 y/o male patient admitted with sob/cough/weakness requiring broad spectrum antibiotics for pneumonia and r/o sepsis. Received 1g vanc and zosyn 3.375g in ED.  Goal of Therapy:  Vancomycin trough level 15-20 mcg/ml  Plan:  Vancomycin 1g IV q18h, zosyn 3.375g IV q8h and will monitor renal fxn. Measure antibiotic drug levels at steady state Follow up culture results  Verlene Mayer, PharmD, BCPS Pager [phone] 04/14/2011,10:34 PM

## 2011-04-14 NOTE — H&P (Signed)
Daniel Tyler is an 71 y.o. male.   Chief Complaint: Fall HPI: Patient has experienced 4 days of progressively worsening cough and shortness of breath. Today he fell over at home and was unable to get up. He was able to call a friend who arrived and called the ambulance.  Daniel Tyler notes cough shortness of breath, weakness, vomiting, and fever. He denies any chest pain palpitations edema.  In the emergency room he was found to have a pneumonia and sepsis.  Fluid resuscitation vancomycin and Zosyn were initiated. Critical care was consult at who determined that he would best be served in stepdown with a medicine admission.    Past Medical History  Diagnosis Date  . Diabetes mellitus   . Enlarged heart    otherwise healthy  Past surgical history: Denies  Family history: Noncontributory Social History: Lives alone. His friend Daniel Tyler [phone]) is his medical decision maker should he worsen.  Additionally his neighbor Daniel Tyler [phone]) is also involved in his care. No smoking alcohol or drugs  Allergies: No Known Allergies  Medications Prior to Admission  Medication Dose Route Frequency Provider Last Rate Last Dose  . 0.9 %  sodium chloride infusion   Intravenous Continuous Clementeen Graham, MD      . acetaminophen (TYLENOL) tablet 650 mg  650 mg Oral Q6H PRN Clementeen Graham, MD       Or  . acetaminophen (TYLENOL) suppository 650 mg  650 mg Rectal Q6H PRN Clementeen Graham, MD      . acetaminophen (TYLENOL) tablet 1,000 mg  1,000 mg Oral To Major Ethelda Chick, MD   1,000 mg at 04/14/11 1824  . albuterol (PROVENTIL) (5 MG/ML) 0.5% nebulizer solution 2.5 mg  2.5 mg Nebulization Q2H PRN Clementeen Graham, MD      . aspirin EC tablet 81 mg  81 mg Oral Daily Clementeen Graham, MD      . guaiFENesin (MUCINEX) 12 hr tablet 600 mg  600 mg Oral BID Clementeen Graham, MD      . heparin injection 5,000 Units  5,000 Units Subcutaneous Q8H Clementeen Graham, MD      . piperacillin-tazobactam (ZOSYN) IVPB 3.375 g  3.375 g Intravenous Once Ethelda Chick, MD   3.375 g at 04/14/11 1825  . sodium chloride 0.9 % bolus 1,000 mL  1,000 mL Intravenous Once Ethelda Chick, MD   1,000 mL at 04/14/11 1813  . sodium chloride 0.9 % bolus 1,000 mL  1,000 mL Intravenous Once Ethelda Chick, MD   1,000 mL at 04/14/11 1943  . vancomycin (VANCOCIN) IVPB 1000 mg/200 mL premix  1,000 mg Intravenous Once Ethelda Chick, MD   1,000 mg at 04/14/11 1903   No current outpatient prescriptions on file as of 04/14/2011.     ROS reviewed please see HPI for further details.   Exam:  Blood pressure 125/62, pulse 94, temperature 99.2 F (37.3 C), temperature source Oral, resp. rate 28, height 5\' 9"  (1.753 m), weight 160 lb (72.576 kg), SpO2 98.00%. Gen: Well NAD, short of breath appearing HEENT: EOMI,  MMM Lungs: Increased worker breathing and tachypnea. Rhonchi noted bilaterally Heart: Tachycardic but regular no MRG Abd: NABS, NT, ND Exts: Non edematous BL  LE, warm and well perfused.    Results for orders placed during the hospital encounter of 04/14/11 (from the past 48 hour(s))  CBC     Status: Abnormal   Collection Time   04/14/11  5:48 PM      Component  Value Range Comment   WBC 10.6 (*) 4.0 - 10.5 (K/uL)    RBC 4.48  4.22 - 5.81 (MIL/uL)    Hemoglobin 13.6  13.0 - 17.0 (g/dL)    HCT 40.9 (*) 81.1 - 52.0 (%)    MCV 86.8  78.0 - 100.0 (fL)    MCH 30.4  26.0 - 34.0 (pg)    MCHC 35.0  30.0 - 36.0 (g/dL)    RDW 91.4  78.2 - 95.6 (%)    Platelets 316  150 - 400 (K/uL)   BASIC METABOLIC PANEL     Status: Abnormal   Collection Time   04/14/11  5:48 PM      Component Value Range Comment   Sodium 130 (*) 135 - 145 (mEq/L)    Potassium 4.4  3.5 - 5.1 (mEq/L)    Chloride 90 (*) 96 - 112 (mEq/L)    CO2 20  19 - 32 (mEq/L)    Glucose, Bld 485 (*) 70 - 99 (mg/dL)    BUN 24 (*) 6 - 23 (mg/dL)    Creatinine, Ser 2.13  0.50 - 1.35 (mg/dL)    Calcium 08.6  8.4 - 10.5 (mg/dL)    GFR calc non Af Amer 81 (*) >90 (mL/min)    GFR calc Af Amer >90  >90  (mL/min)   LACTIC ACID, PLASMA     Status: Abnormal   Collection Time   04/14/11  6:10 PM      Component Value Range Comment   Lactic Acid, Venous 4.5 (*) 0.5 - 2.2 (mmol/L)   PROCALCITONIN     Status: Normal   Collection Time   04/14/11  6:10 PM      Component Value Range Comment   Procalcitonin 29.92     CARDIAC PANEL(CRET KIN+CKTOT+MB+TROPI)     Status: Abnormal   Collection Time   04/14/11  6:10 PM      Component Value Range Comment   Total CK 1282 (*) 7 - 232 (U/L)    CK, MB 5.5 (*) 0.3 - 4.0 (ng/mL)    Troponin I 1.74 (*) <0.30 (ng/mL)    Relative Index 0.4  0.0 - 2.5    URINALYSIS, ROUTINE W REFLEX MICROSCOPIC     Status: Abnormal   Collection Time   04/14/11  6:33 PM      Component Value Range Comment   Color, Urine YELLOW  YELLOW     APPearance CLEAR  CLEAR     Specific Gravity, Urine 1.030  1.005 - 1.030     pH 5.0  5.0 - 8.0     Glucose, UA >1000 (*) NEGATIVE (mg/dL)    Hgb urine dipstick LARGE (*) NEGATIVE     Bilirubin Urine NEGATIVE  NEGATIVE     Ketones, ur 15 (*) NEGATIVE (mg/dL)    Protein, ur 578 (*) NEGATIVE (mg/dL)    Urobilinogen, UA 0.2  0.0 - 1.0 (mg/dL)    Nitrite NEGATIVE  NEGATIVE     Leukocytes, UA NEGATIVE  NEGATIVE    URINE MICROSCOPIC-ADD ON     Status: Normal   Collection Time   04/14/11  6:33 PM      Component Value Range Comment   Squamous Epithelial / LPF RARE  RARE     RBC / HPF 0-2  <3 (RBC/hpf)    Bacteria, UA RARE  RARE    INFLUENZA PANEL BY PCR     Status: Normal   Collection Time   04/14/11  6:48 PM  Component Value Range Comment   Influenza A By PCR NEGATIVE  NEGATIVE     Influenza B By PCR NEGATIVE  NEGATIVE     H1N1 flu by pcr NOT DETECTED  NOT DETECTED     Ct Head Wo Contrast No acute intracranial abnormality.  Sinusitis.    Ct Cervical Spine Wo Contrast .  IMPRESSION: Negative for fracture  Dg Chest Port 1 View .  IMPRESSION: COPD.  Right lower lobe infiltrate, suspicious for pneumonia.  Original Report Authenticated  By: Camelia Phenes, [name]    Assessment/Plan 71 year old male with pneumonia and sepsis. 1) sepsis but not septic shock and his blood pressure has normalized with small fluid resuscitation.  Blood and urine cultures obtained and patient started empirically on vancomycin and Zosyn.    Plan to admit to step down status with fluids at a rate of 150 mL per hour while continuing vancomycin and Zosyn.   We'll watch for signs of fluid overload and follow closely tonight.  2) pneumonia: On 4 L nasal cannula with some increased work of breathing and tachypnea.    Plan to obtain arterial blood gas, and continue nasal cannula.  We'll admit to step down as above, well adding   incentive spirometer.  Antibiotics as above  3) elevated troponin at 1.74 with a CK of 1200 and a CK-MB of 5.5.  This is likely noncardiogenic in nature as CK-MB to CK ratio is very low and patient is septic. However I did discuss this with cardiologist who would like to formalize the  consult.    Plan to cycle enzymes tonight and if continues to elevate would consider heparin infusion. We'll follow  4) diabetes: On metformin, glipizide, Lantus 15 units. Plan to hold glipizide and metformin Will decrease Lantus to 5 units. Additionally will add sliding scale insulin.  5) diet: N.p.o. initially as this patient has a moderate risk for requiring intubation tonight. Last meal around 9:00 PM.   6) DVT prophylaxis: Heparin subcutaneously  7) weakness: Likely due to sepsis. Consult physical therapy for evaluation of the morning.  8) CODE STATUS: Full code  Lashawnta Burgert 04/14/2011, 9:39 PM

## 2011-04-14 NOTE — ED Notes (Signed)
Lab and x-ray to bedside.  Tech at bedside for CCUS.

## 2011-04-15 ENCOUNTER — Inpatient Hospital Stay (HOSPITAL_COMMUNITY): Payer: Medicare Other

## 2011-04-15 ENCOUNTER — Other Ambulatory Visit: Payer: Self-pay

## 2011-04-15 ENCOUNTER — Encounter (HOSPITAL_COMMUNITY): Payer: Self-pay | Admitting: *Deleted

## 2011-04-15 DIAGNOSIS — I219 Acute myocardial infarction, unspecified: Secondary | ICD-10-CM

## 2011-04-15 DIAGNOSIS — E1165 Type 2 diabetes mellitus with hyperglycemia: Secondary | ICD-10-CM

## 2011-04-15 DIAGNOSIS — J159 Unspecified bacterial pneumonia: Secondary | ICD-10-CM

## 2011-04-15 DIAGNOSIS — I200 Unstable angina: Secondary | ICD-10-CM

## 2011-04-15 DIAGNOSIS — I5023 Acute on chronic systolic (congestive) heart failure: Secondary | ICD-10-CM

## 2011-04-15 DIAGNOSIS — J9600 Acute respiratory failure, unspecified whether with hypoxia or hypercapnia: Secondary | ICD-10-CM

## 2011-04-15 LAB — HEPATIC FUNCTION PANEL
Bilirubin, Direct: 0.1 mg/dL (ref 0.0–0.3)
Indirect Bilirubin: 0.3 mg/dL (ref 0.3–0.9)
Total Bilirubin: 0.4 mg/dL (ref 0.3–1.2)

## 2011-04-15 LAB — CBC
HCT: 34.9 % — ABNORMAL LOW (ref 39.0–52.0)
MCV: 86.4 fL (ref 78.0–100.0)
RDW: 13.9 % (ref 11.5–15.5)
WBC: 10.8 10*3/uL — ABNORMAL HIGH (ref 4.0–10.5)

## 2011-04-15 LAB — BASIC METABOLIC PANEL
BUN: 22 mg/dL (ref 6–23)
Chloride: 100 mEq/L (ref 96–112)
Creatinine, Ser: 0.94 mg/dL (ref 0.50–1.35)
GFR calc Af Amer: >90 mL/min (ref >90)
Glucose, Bld: 368 mg/dL — ABNORMAL HIGH (ref 70–99)

## 2011-04-15 LAB — CARDIAC PANEL(CRET KIN+CKTOT+MB+TROPI)
CK, MB: 14.6 ng/mL (ref 0.3–4.0)
Relative Index: 0.7 (ref 0.0–2.5)
Total CK: 1988 U/L — ABNORMAL HIGH (ref 7–232)
Troponin I: 6 ng/mL (ref <0.30)
Troponin I: 22.75 ng/mL (ref <0.30)

## 2011-04-15 LAB — POCT I-STAT 3, ART BLOOD GAS (G3+)
TCO2: 19 mmol/L (ref 0–100)
pCO2 arterial: 35.4 mmHg (ref 35.0–45.0)
pH, Arterial: 7.33 — ABNORMAL LOW (ref 7.350–7.450)
pO2, Arterial: 176 mmHg — ABNORMAL HIGH (ref 80.0–100.0)

## 2011-04-15 LAB — GLUCOSE, CAPILLARY
Glucose-Capillary: 349 mg/dL — ABNORMAL HIGH (ref 70–99)
Glucose-Capillary: 140 mg/dL — ABNORMAL HIGH (ref 70–99)

## 2011-04-15 LAB — BLOOD GAS, ARTERIAL
Acid-base deficit: 5.2 mmol/L — ABNORMAL HIGH (ref 0.0–2.0)
O2 Content: 4 L/min
O2 Saturation: 92.5 %
TCO2: 19.8 mmol/L (ref 0–100)
pCO2 arterial: 32.1 mmHg — ABNORMAL LOW (ref 35.0–45.0)

## 2011-04-15 LAB — LIPASE, BLOOD: Lipase: 9 U/L — ABNORMAL LOW (ref 11–59)

## 2011-04-15 LAB — HEPARIN LEVEL (UNFRACTIONATED): Heparin Unfractionated: 0.42 IU/mL (ref 0.30–0.70)

## 2011-04-15 LAB — URINE CULTURE
Colony Count: NO GROWTH
Culture: NO GROWTH

## 2011-04-15 LAB — MRSA PCR SCREENING: MRSA by PCR: NEGATIVE

## 2011-04-15 MED ORDER — SODIUM CHLORIDE 0.9 % IJ SOLN
INTRAMUSCULAR | Status: AC
  Filled 2011-04-15: qty 40

## 2011-04-15 MED ORDER — SODIUM CHLORIDE 0.9 % IV SOLN
25.0000 ug/h | INTRAVENOUS | Status: DC
  Administered 2011-04-15: 100 ug/h via INTRAVENOUS
  Administered 2011-04-16: 75 ug/h via INTRAVENOUS
  Filled 2011-04-15 (×2): qty 50

## 2011-04-15 MED ORDER — PANTOPRAZOLE SODIUM 40 MG PO PACK
40.0000 mg | PACK | ORAL | Status: DC
  Administered 2011-04-16 – 2011-04-19 (×5): 40 mg
  Filled 2011-04-15 (×7): qty 20

## 2011-04-15 MED ORDER — CIPROFLOXACIN IN D5W 400 MG/200ML IV SOLN
400.0000 mg | Freq: Two times a day (BID) | INTRAVENOUS | Status: DC
  Administered 2011-04-15 – 2011-04-16 (×3): 400 mg via INTRAVENOUS
  Filled 2011-04-15 (×4): qty 200

## 2011-04-15 MED ORDER — ASPIRIN 325 MG PO TABS: 162.5000 mg | ORAL_TABLET | Freq: Every day | ORAL | Status: DC

## 2011-04-15 MED ORDER — BIOTENE DRY MOUTH MT LIQD
15.0000 mL | Freq: Four times a day (QID) | OROMUCOSAL | Status: DC
  Administered 2011-04-15 – 2011-04-16 (×5): 15 mL via OROMUCOSAL

## 2011-04-15 MED ORDER — CHLORHEXIDINE GLUCONATE 0.12 % MT SOLN
15.0000 mL | Freq: Two times a day (BID) | OROMUCOSAL | Status: DC
  Administered 2011-04-15: 15 mL via OROMUCOSAL
  Filled 2011-04-15: qty 15

## 2011-04-15 MED ORDER — FUROSEMIDE 10 MG/ML IJ SOLN
80.0000 mg | Freq: Once | INTRAMUSCULAR | Status: AC
  Administered 2011-04-15: 80 mg via INTRAVENOUS
  Filled 2011-04-15: qty 8

## 2011-04-15 MED ORDER — ETOMIDATE 2 MG/ML IV SOLN
20.0000 mg | INTRAVENOUS | Status: AC
  Administered 2011-04-15: 20 mg via INTRAVENOUS

## 2011-04-15 MED ORDER — CHLORHEXIDINE GLUCONATE 0.12 % MT SOLN
15.0000 mL | Freq: Two times a day (BID) | OROMUCOSAL | Status: DC
  Administered 2011-04-15 – 2011-04-16 (×3): 15 mL via OROMUCOSAL
  Filled 2011-04-15 (×2): qty 15

## 2011-04-15 MED ORDER — METOPROLOL TARTRATE 1 MG/ML IV SOLN
5.0000 mg | Freq: Four times a day (QID) | INTRAVENOUS | Status: DC
  Filled 2011-04-15 (×4): qty 5

## 2011-04-15 MED ORDER — VANCOMYCIN HCL IN DEXTROSE 1-5 GM/200ML-% IV SOLN
1000.0000 mg | Freq: Two times a day (BID) | INTRAVENOUS | Status: DC
  Administered 2011-04-15 – 2011-04-17 (×4): 1000 mg via INTRAVENOUS
  Filled 2011-04-15 (×6): qty 200

## 2011-04-15 MED ORDER — MIDAZOLAM HCL 5 MG/ML IJ SOLN
1.0000 mg/h | INTRAMUSCULAR | Status: DC
  Administered 2011-04-15: 1 mg/h via INTRAVENOUS
  Filled 2011-04-15: qty 10

## 2011-04-15 MED ORDER — FUROSEMIDE 10 MG/ML IJ SOLN
20.0000 mg | INTRAMUSCULAR | Status: AC
  Administered 2011-04-15: 20 mg via INTRAVENOUS
  Filled 2011-04-15: qty 2

## 2011-04-15 MED ORDER — ASPIRIN 81 MG PO CHEW
162.0000 mg | CHEWABLE_TABLET | Freq: Every day | ORAL | Status: DC
  Administered 2011-04-16 – 2011-04-20 (×5): 162 mg
  Filled 2011-04-15: qty 2
  Filled 2011-04-15 (×2): qty 1
  Filled 2011-04-15: qty 2
  Filled 2011-04-15 (×2): qty 1
  Filled 2011-04-15: qty 2

## 2011-04-15 MED ORDER — PROPOFOL 10 MG/ML IV EMUL: 5.0000 ug/kg/min | INTRAVENOUS | Status: DC

## 2011-04-15 MED ORDER — HEPARIN SOD (PORCINE) IN D5W 100 UNIT/ML IV SOLN
1700.0000 [IU]/h | INTRAVENOUS | Status: DC
  Administered 2011-04-15 – 2011-04-16 (×2): 900 [IU]/h via INTRAVENOUS
  Administered 2011-04-17: 1450 [IU]/h via INTRAVENOUS
  Administered 2011-04-17 – 2011-04-19 (×3): 1600 [IU]/h via INTRAVENOUS
  Administered 2011-04-19: 1700 [IU]/h via INTRAVENOUS
  Filled 2011-04-15 (×14): qty 250

## 2011-04-15 MED ORDER — PANTOPRAZOLE SODIUM 40 MG IV SOLR
40.0000 mg | INTRAVENOUS | Status: DC
  Administered 2011-04-15: 40 mg via INTRAVENOUS
  Filled 2011-04-15 (×2): qty 40

## 2011-04-15 MED ORDER — PROPOFOL 10 MG/ML IV EMUL
INTRAVENOUS | Status: AC
  Administered 2011-04-15: 1000 mg
  Filled 2011-04-15: qty 100

## 2011-04-15 MED ORDER — SODIUM CHLORIDE 0.9 % IJ SOLN
INTRAMUSCULAR | Status: AC
  Administered 2011-04-15: 40 mL
  Filled 2011-04-15: qty 60

## 2011-04-15 MED ORDER — SODIUM CHLORIDE 0.9 % IJ SOLN
INTRAMUSCULAR | Status: AC
  Filled 2011-04-15: qty 10

## 2011-04-15 MED ORDER — METOPROLOL TARTRATE 25 MG/10 ML ORAL SUSPENSION
25.0000 mg | Freq: Two times a day (BID) | ORAL | Status: DC
  Administered 2011-04-15 – 2011-04-20 (×8): 25 mg
  Filled 2011-04-15 (×12): qty 10

## 2011-04-15 MED ORDER — GLUCERNA 1.2 CAL PO LIQD
1000.0000 mL | ORAL | Status: DC
  Administered 2011-04-15: 1000 mL
  Filled 2011-04-15 (×4): qty 1000

## 2011-04-15 MED ORDER — INSULIN ASPART 100 UNIT/ML ~~LOC~~ SOLN
0.0000 [IU] | SUBCUTANEOUS | Status: DC
  Administered 2011-04-15 (×2): 2 [IU] via SUBCUTANEOUS
  Administered 2011-04-15: 3 [IU] via SUBCUTANEOUS
  Administered 2011-04-15: 15 [IU] via SUBCUTANEOUS
  Administered 2011-04-15: 8 [IU] via SUBCUTANEOUS
  Administered 2011-04-16: 3 [IU] via SUBCUTANEOUS
  Administered 2011-04-16: 11 [IU] via SUBCUTANEOUS
  Administered 2011-04-16: 5 [IU] via SUBCUTANEOUS
  Administered 2011-04-16: 8 [IU] via SUBCUTANEOUS
  Administered 2011-04-16: 3 [IU] via SUBCUTANEOUS
  Administered 2011-04-16: 8 [IU] via SUBCUTANEOUS
  Administered 2011-04-17: 5 [IU] via SUBCUTANEOUS
  Administered 2011-04-17 (×2): 3 [IU] via SUBCUTANEOUS
  Administered 2011-04-17: 8 [IU] via SUBCUTANEOUS
  Administered 2011-04-18 (×2): 5 [IU] via SUBCUTANEOUS
  Administered 2011-04-18: 11 [IU] via SUBCUTANEOUS
  Administered 2011-04-18: 3 [IU] via SUBCUTANEOUS
  Administered 2011-04-18: 5 [IU] via SUBCUTANEOUS
  Administered 2011-04-18: 8 [IU] via SUBCUTANEOUS
  Administered 2011-04-19 (×3): 3 [IU] via SUBCUTANEOUS
  Administered 2011-04-19: 2 [IU] via SUBCUTANEOUS
  Administered 2011-04-19: 15 [IU] via SUBCUTANEOUS
  Filled 2011-04-15: qty 3

## 2011-04-15 MED ORDER — SODIUM CHLORIDE 0.9 % IV SOLN
INTRAVENOUS | Status: DC
  Administered 2011-04-15: 20 mL/h via INTRAVENOUS
  Administered 2011-04-23: 08:00:00 via INTRAVENOUS

## 2011-04-15 MED ORDER — ACETAMINOPHEN 325 MG PO TABS
650.0000 mg | ORAL_TABLET | Freq: Four times a day (QID) | ORAL | Status: DC | PRN
  Administered 2011-04-17 – 2011-04-19 (×5): 650 mg
  Filled 2011-04-15 (×2): qty 2
  Filled 2011-04-15: qty 1
  Filled 2011-04-15 (×2): qty 2

## 2011-04-15 MED ORDER — SODIUM CHLORIDE 0.9 % IJ SOLN
INTRAMUSCULAR | Status: AC
  Filled 2011-04-15: qty 20

## 2011-04-15 MED ORDER — SIMVASTATIN 40 MG PO TABS
40.0000 mg | ORAL_TABLET | Freq: Every day | ORAL | Status: DC
  Administered 2011-04-15 – 2011-04-19 (×5): 40 mg
  Filled 2011-04-15 (×6): qty 1

## 2011-04-15 MED ORDER — HEPARIN BOLUS VIA INFUSION
3000.0000 [IU] | Freq: Once | INTRAVENOUS | Status: AC
  Administered 2011-04-15: 3000 [IU] via INTRAVENOUS
  Filled 2011-04-15: qty 3000

## 2011-04-15 NOTE — H&P (Signed)
Family Medicine Teaching Service Attending Note  I interviewed and examined patient Daniel Tyler and reviewed their tests and x-rays.  I discussed with Dr. Denyse Amass and reviewed their note for today.  I agree with their assessment and plan.     Additionally  Patient respiratory status deteriorated overnight and is now intubated and managed by CCM

## 2011-04-15 NOTE — Progress Notes (Signed)
Inpatient Diabetes Program Recommendations  AACE/ADA: New Consensus Statement on Inpatient Glycemic Control (2009)  Target Ranges:  Prepandial:   less than 140 mg/dL      Peak postprandial:   less than 180 mg/dL (1-2 hours)      Critically ill patients:  140 - 180 mg/dL    Results for SHERLOCK, NANCARROW (MRN [id number]) as of 04/15/2011 12:53  Ref. Range 04/15/2011 02:42 04/15/2011 08:37 04/15/2011 12:16  Glucose-Capillary Latest Range: 70-99 mg/dL 409 (H) 811 (H) 914 (H)    Inpatient Diabetes Program Recommendations Insulin - Basal: Please start ICU Hyperglycemia Protocol- CBGs today: 349/ 273/ 188 mg/dl  Note: Will follow. Ambrose Finland RN, MSN, CDE Diabetes Coordinator Inpatient Diabetes Program [phone]

## 2011-04-15 NOTE — Progress Notes (Signed)
Daniel Tyler is a 71 y.o. male admitted on 04/14/2011 with cough and dyspnea, and fall.  He had emesis, and fever in ED with low BP.  He was initially DNR/DNI.  He developed worsening respiratory distress and found to have elevated cardiac enzymes, and later reversed code status.  He was intubated and transferred to ICU. PMHx DM, Cardiomegaly  Line/tube: 2/19 ETT>> 2/19 Rt [place] CVL>>  Cultures: Sputum 2/18> Urine 2/18>> Blood 2/18>>  Antibiotics: Vancomycin 2/18>> Zosyn 2/18>> Cipro 2/18>>  Tests/events: 2/18 CT head>>no acute process 2/18 CT neck>>no neck fracture 2/19 Echo>>  SUBJECTIVE: Tolerating pressure support.  OBJECTIVE:  Blood pressure 94/58, pulse 89, temperature 99.2 F (37.3 C), temperature source Oral, resp. rate 15, height 5\' 9"  (1.753 m), weight 141 lb 12.1 oz (64.3 kg), SpO2 98.00%. Wt Readings from Last 3 Encounters:  04/15/11 141 lb 12.1 oz (64.3 kg)   Body mass index is 20.93 kg/(m^2).  I/O last 3 completed shifts: In: 683 [I.V.:413; IV Piggyback:270] Out: 1411 [Urine:1410; Stool:1]  Vent Mode:  [-] PRVC FiO2 (%):  [40 %-100 %] 40 % Set Rate:  [15 bmp] 15 bmp Vt Set:  [550 mL] 550 mL PEEP:  [5 cmH20] 5 cmH20 Plateau Pressure:  [12 cmH20-14 cmH20] 13 cmH20  General - no distress HEENT - ETT in place Cardiac - s1s2 regular Chest - basilar rales Abd - soft, nontender Ext - no edema Neuro - sedated, agitated at times  CBC    Component Value Date/Time   WBC 10.8* 04/15/2011 0400   RBC 4.04* 04/15/2011 0400   HGB 12.1* 04/15/2011 0400   HCT 34.9* 04/15/2011 0400   PLT 295 04/15/2011 0400   MCV 86.4 04/15/2011 0400   MCH 30.0 04/15/2011 0400   MCHC 34.7 04/15/2011 0400   RDW 13.9 04/15/2011 0400    BMET    Component Value Date/Time   NA 134* 04/15/2011 0400   K 3.8 04/15/2011 0400   CL 100 04/15/2011 0400   CO2 21 04/15/2011 0400   GLUCOSE 368* 04/15/2011 0400   BUN 22 04/15/2011 0400   CREATININE 0.94 04/15/2011 0400   CALCIUM 8.9 04/15/2011 0400    GFRNONAA 83* 04/15/2011 0400   GFRAA >90 04/15/2011 0400   ABG    Component Value Date/Time   PHART 7.330* 04/15/2011 0330   PCO2ART 35.4 04/15/2011 0330   PO2ART 176.0* 04/15/2011 0330   HCO3 18.5* 04/15/2011 0330   TCO2 19 04/15/2011 0330   ACIDBASEDEF 6.0* 04/15/2011 0330   O2SAT 99.0 04/15/2011 0330    Ct Head Wo Contrast  04/14/2011  *RADIOLOGY REPORT*  Clinical Data:  Weakness.  Fall  CT HEAD WITHOUT CONTRAST CT CERVICAL SPINE WITHOUT CONTRAST  Technique:  Multidetector CT imaging of the head and cervical spine was performed following the standard protocol without intravenous contrast.  Multiplanar CT image reconstructions of the cervical spine were also generated.  Comparison:   None  CT HEAD  Findings: Age appropriate atrophy.  Negative for hemorrhage. Negative for acute infarct or mass.  Chronic sinusitis with mucosal thickening in the paranasal sinuses. Calvarium is intact.  IMPRESSION: No acute intracranial abnormality.  Sinusitis.  CT CERVICAL SPINE  Findings: Negative for fracture.  Cervical disc degeneration and moderate to advanced spondylosis at C3-4, C4-5, and C5-6.  Milder spondylosis at C6-7.  Carotid atherosclerotic calcification is present.  IMPRESSION: Negative for fracture.  Original Report Authenticated By: Camelia Phenes, [name]   Ct Cervical Spine Wo Contrast  04/14/2011  *RADIOLOGY REPORT*  Clinical  Data:  Weakness.  Fall  CT HEAD WITHOUT CONTRAST CT CERVICAL SPINE WITHOUT CONTRAST  Technique:  Multidetector CT imaging of the head and cervical spine was performed following the standard protocol without intravenous contrast.  Multiplanar CT image reconstructions of the cervical spine were also generated.  Comparison:   None  CT HEAD  Findings: Age appropriate atrophy.  Negative for hemorrhage. Negative for acute infarct or mass.  Chronic sinusitis with mucosal thickening in the paranasal sinuses. Calvarium is intact.  IMPRESSION: No acute intracranial abnormality.  Sinusitis.  CT  CERVICAL SPINE  Findings: Negative for fracture.  Cervical disc degeneration and moderate to advanced spondylosis at C3-4, C4-5, and C5-6.  Milder spondylosis at C6-7.  Carotid atherosclerotic calcification is present.  IMPRESSION: Negative for fracture.  Original Report Authenticated By: Camelia Phenes, [name]   Dg Chest Port 1 View  04/15/2011  *RADIOLOGY REPORT*  Clinical Data: Central line placement.  PORTABLE CHEST - 1 VIEW  Comparison: Chest radiograph performed earlier today at 01:09 a.m.  Findings: The patient's right subclavian line is noted ending about the mid to distal SVC.  The endotracheal tube is seen ending 5-6 cm above the carina.  An enteric tube is noted extending below the diaphragm.  The lungs are well-aerated.  There is worsened right basilar airspace opacification, and slightly worsened mild left basilar airspace opacity, concerning for multifocal pneumonia.  There is no evidence of pleural effusion or pneumothorax.  The heart is normal in size; the mediastinal contour is within normal limits.  No acute osseous abnormalities are seen.  IMPRESSION:  1.  Right subclavian line noted ending about the mid to distal SVC. 2.  Worsening multifocal pneumonia, particularly at the right lung base. 3.  Endotracheal tube ends 5-6 cm above the carina.  Original Report Authenticated By: Tonia Ghent, [name]   Dg Chest Port 1 View  04/15/2011  *RADIOLOGY REPORT*  Clinical Data: Shortness of breath.  Clinical concern for fluid overload.  PORTABLE CHEST - 1 VIEW  Comparison: 04/14/2011.  Findings: Significant increase in right lower lobe airspace opacity with interval mild left basilar airspace opacity.  Normal sized heart.  No pleural fluid.  Unremarkable bones.  IMPRESSION:  1.  Worsening right lower lobe pneumonia. 2.  Interval probable pneumonia at the left lung base.  Original Report Authenticated By: Darrol Angel, [name]   Dg Chest Port 1 View  04/14/2011  *RADIOLOGY REPORT*  Clinical Data:  Right-sided chest pain  PORTABLE CHEST - 1 VIEW  Comparison: None.  Findings: Right lower lobe airspace disease, possible pneumonia.  COPD with hyperinflation.  Apical emphysema.  No pleural effusion. Vascularity is normal.  IMPRESSION: COPD.  Right lower lobe infiltrate, suspicious for pneumonia.  Original Report Authenticated By: Camelia Phenes, [name]    ASSESSMENT/PLAN:  Acute respiratory failure 2nd to pneumonia -pressure support wean as tolerated>>no ready for extubation -f/u CXR -prn albuterol  NSTEMI vs demand ischemia -cardiology following -f/u Echo -continue lopressor, ASA, zocor -heparin gtt per cardiology  Pneumonia with concern for aspiration -D2/x zoxyn, vancomycin, cipro  DM -SSI  Protein calorie malnutrition -will have nutrition recommend tube feeds  Sedation -sedation protocol while on vent  Best practice -Protonix for SUP  Critical care time 35 minutes.  Sakiya Stepka Pager:  [phone] 04/15/2011, 10:06 AM

## 2011-04-15 NOTE — Progress Notes (Signed)
Interim progress note.  Called to bedside for nurse due to worsening respiratory distress in the last 30 mins or so.  Rapid response called.    Tachypnea to 40 w/ tachycardia to 120. SBP 150  Exam.  Gen: Resp Distress Lungs: Tachypnea with loud ronchi bl. Not able to appreciate crackles.  Exts  No edema.  CXR: Bedside eval looks to me to show edema BL with lobar pneumonia. Radiology read pending.   ABG: 7.38/32/62/18.9 on 4L [place]  Assessment: Likely volume overload with pneumonia and sepsis.  Plan: Treat oxygenation deficit with additional o2 via [place] to mask. Will reserve Bipap for worsening resp distress.  Additionally treating pulmonary edema with cessation of IV fluids and lasix 20ml iv x1.  Will follow closely.   Tijuan Dantes 1:13 AM

## 2011-04-15 NOTE — Progress Notes (Addendum)
CRITICAL VALUE ALERT  Critical value received:  CKMB 14.6  Trop 6.0  Date of notification:  04/15/11  Time of notification:  0600  Critical value read back:yes  Nurse who received alert:  A.Canary Brim RN  MD notified (1st page):  McGill MD (resident with PCCM)  Time of first page:  [phone]  MD notified (2nd page): Deterding MD  Time of second page: [phone]  Responding MD:  Talked with Loraine Leriche RN at [facility]  Time MD responded:  [phone]  MCGills MD (resident PCCM) responded on the unit at [phone]

## 2011-04-15 NOTE — Consult Note (Signed)
Name: Daniel Tyler MRN: [id number] DOB: [birth year]    LOS: 1  PCCM CONSULTATION NOTE  History of Present Illness: 71 yo with cough and dyspnea for 4 days.  On the day of admission fell at home and was unable to get up.  Upon arrival to [facility] ED also reported vomiting and fever.  In ED blood pressure responded to fluid resuscitation, antibiotics were given and he was admitted to step down DNI / DNR status.  Apparently deteriorated on the floor due to possible aspiration / pulmonary edema.  Requested everything done.  Was intubated and transferred to ICU for farther management.  Lines / Drains: 2/19  OETT 2/19  OGT 2/19  R [place] TLC 2/19  Foley  Cultures: 2/19  Blood 2/19  Sputum 2/19  Urine  Antibiotics: 2/19  Vancomycin (empirical, MRSA) 2/19  Zosyn (emprirical G neg) 2/19  Cipro (empirical G neg, atypical)  Tests / Events: 2/19  PCXR>>>Bibasilar airspace disease, hardware in good position  The patient is sedated, intubated and unable to provide history, which was obtained for available medical records.    Past Medical History  Diagnosis Date  . Diabetes mellitus   . Enlarged heart    History reviewed. No pertinent past surgical history. Prior to Admission medications   Medication Sig Start Date End Date Taking? Authorizing Provider  acetaminophen (TYLENOL) 500 MG tablet Take 1,000 mg by mouth every 6 (six) hours as needed. For pain   Yes Historical Provider, MD  aspirin 325 MG tablet Take 975 mg by mouth every 6 (six) hours as needed. For pain   Yes Historical Provider, MD  glipiZIDE (GLUCOTROL) 10 MG tablet Take 10 mg by mouth 2 (two) times daily before a meal.   Yes Historical Provider, MD  insulin glargine (LANTUS) 100 UNIT/ML injection Inject 15 Units into the skin at bedtime.   Yes Historical Provider, MD  metFORMIN (GLUCOPHAGE) 1000 MG tablet Take 1,000 mg by mouth 2 (two) times daily with a meal.   Yes Historical Provider, MD   Allergies No Known Allergies  Family  History History reviewed. No pertinent family history.  Social History  reports that he has never smoked. He does not have any smokeless tobacco history on file. He reports that he does not drink alcohol or use illicit drugs.  Review Of Systems  Patient unable to provide  Vital Signs: Temp:  [97.6 F (36.4 C)-101.8 F (38.8 C)] 100.5 F (38.1 C) (02/19 0245) Pulse Rate:  [92-136] 130  (02/19 0217) Resp:  [20-38] 26  (02/19 0217) BP: (95-177)/(52-94) 177/94 mmHg (02/19 0217) SpO2:  [85 %-99 %] 98 % (02/19 0200) FiO2 (%):  [50 %-100 %] 100 % (02/19 0217) Weight:  [65.2 kg (143 lb 11.8 oz)-72.576 kg (160 lb)] 65.2 kg (143 lb 11.8 oz) (02/18 2330)    Physical Examination: General:  Intubated, mechanically ventilated, intermittently agitated Neuro:  Synchronous, nonfocal HEENT:  PERRL, pink conjunctivae, moist membranes Neck:  Supple, somewhat elevated JVP   Cardiovascular:  RRR, no M/R/G Lungs:  Bilateral coarse rhonchi Abdomen:  Soft, nontender, nondistended, bowel sounds present Musculoskeletal:  Moves all extremities, no pedal edema Skin:  No rash  Ventilator settings: Vent Mode:  [-] PRVC FiO2 (%):  [50 %-100 %] 100 % Vt Set:  [550 mL] 550 mL PEEP:  [5 cmH20] 5 cmH20 Plateau Pressure:  [14 cmH20] 14 cmH20  Labs and Imaging:  Reviewed.  Please refer to the Assessment and Plan section for relevant results.  ASSESSMENT AND PLAN  NEUROLOGIC A:  Encephalopathy, metabolic P: -->  Propofol gtt to goal RASS 0 to -1 -->  Daily WUA  PULMONARY  Lab 04/15/11 0051 04/14/11 2158  PHART 7.387 7.416  PCO2ART 32.1* 27.5*  PO2ART 62.7* 80.0  HCO3 18.9* 17.7*  O2SAT 92.5 96.0   A:  Acute hypoxemic respiratory failure secondary to CAP vs acute pulmonary edema. P: -->  Full mechanical support -->  Goal pH>7.30, goal SpO2>92 -->  Follow up ABG -->  Follow up CXR -->  Bronchodilators -->  Daily SBT -->  Antibiotics and cultures per ID section  CARDIOVASCULAR  Lab  04/14/11 2136 04/14/11 1810  TROPONINI 2.15* 1.74*  LATICACIDVEN -- 4.5*  PROBNP -- --   A:  NSTEMI vs demand ischemia.  EKG>>>St depression lateral leads.  Hypotension resolved, now hypertensive. P: -->  Trend cardiac enzymes -->  Heparin gtt per ACS protocol x 48h -->  ASA, Metoprolol with holding parameters, Simvastatin -->  Cardiology consultation -->  TTE  RENAL  Lab 04/14/11 2136 04/14/11 1748  NA -- 130*  K -- 4.4  CL -- 90*  CO2 -- 20  BUN -- 24*  CREATININE 0.86 0.99  CALCIUM -- 10.2  MG -- --  PHOS -- --   A:  Hyponatremia.  Hypovolemia. P: -->  BMP, Mg, Phos in AM -->  Fluid resuscitated in ED  GASTROINTESTINAL No results found for this basename: AST:5,ALT:5,ALKPHOS:5,BILITOT:5,PROT:5,ALBUMIN:5 in the last 168 hours A:  History of nausea preadmission. P: -->  Check LFT, amylase, lipase.  HEMATOLOGIC  Lab 04/14/11 2136 04/14/11 1748  HGB 11.5* 13.6  HCT 34.1* 38.9*  PLT 266 316  INR -- --  APTT -- --   A:  Anemia.  Hemodilution. P: -->  CBC in AM  INFECTIOUS  Lab 04/14/11 2136 04/14/11 1810 04/14/11 1748  WBC 9.0 -- 10.6*  PROCALCITON -- 29.92 --   A:  Pneumonia (CAP) P: -->  Zosyn, Vancomycin, Ciprofloxacin -->  Follow cultures -->  Trend WBC  ENDOCRINE  Lab 04/15/11 0242 04/14/11 2218  GLUCAP 349* 479*   A:  Diabetes.  Hyperglycemia. P: -->  CBG checks / SSI -->  D/c Lantus, Glipizide, Metformin.  BEST PRACTICE / DISPOSITION -->  ICU status under PCCM -->  Full code -->  NPO -->  DVT Px is not indicated (on therapeutic Heparin) -->  Protonix IV for GI Px -->  Ventilator bundle -->  Family is not available.  The patient is critically ill with multiple organ systems failure and requires high complexity decision making for assessment and support, frequent evaluation and titration of therapies, application of advanced monitoring technologies and extensive interpretation of multiple databases. Critical Care Time devoted to  patient care services described in this note is 45 minutes.  Orlean Bradford, [name] Pulmonary and Critical Care Medicine [facility] Cell: [phone] Pager: [phone]  04/15/2011, 3:09 AM

## 2011-04-15 NOTE — Progress Notes (Signed)
ANTICOAGULATION CONSULT NOTE - Initial Consult  Pharmacy Consult for heparin and cipro  Indication: chest pain/ACS  No Known Allergies  Patient Measurements: Height: 5\' 9"  (175.3 cm) Weight: 141 lb 12.1 oz (64.3 kg) IBW/kg (Calculated) : 70.7  Heparin Dosing Weight: 64kg  Vital Signs: Temp: 100.5 F (38.1 C) (02/19 0245) Temp src: Oral (02/19 0245) BP: 177/94 mmHg (02/19 0217) Pulse Rate: 130  (02/19 0217)  Labs:  Basename 04/14/11 2136 04/14/11 1810 04/14/11 1748  HGB 11.5* -- 13.6  HCT 34.1* -- 38.9*  PLT 266 -- 316  APTT -- -- --  LABPROT -- -- --  INR -- -- --  HEPARINUNFRC -- -- --  CREATININE 0.86 -- 0.99  CKTOTAL 1466* 1282* --  CKMB 4.7* 5.5* --  TROPONINI 2.15* 1.74* --   Estimated Creatinine Clearance: 72.7 ml/min (by C-G formula based on Cr of 0.86).  Medical History: Past Medical History  Diagnosis Date  . Diabetes mellitus   . Enlarged heart     Medications:  Prescriptions prior to admission  Medication Sig Dispense Refill  . acetaminophen (TYLENOL) 500 MG tablet Take 1,000 mg by mouth every 6 (six) hours as needed. For pain      . aspirin 325 MG tablet Take 975 mg by mouth every 6 (six) hours as needed. For pain      . glipiZIDE (GLUCOTROL) 10 MG tablet Take 10 mg by mouth 2 (two) times daily before a meal.      . insulin glargine (LANTUS) 100 UNIT/ML injection Inject 15 Units into the skin at bedtime.      . metFORMIN (GLUCOPHAGE) 1000 MG tablet Take 1,000 mg by mouth 2 (two) times daily with a meal.        Assessment: Acute hypoxic RF in setting of CAP or acute pulmonary edema Goal of Therapy:  Heparin level 0.3-0.7 units/ml   Plan: heparin 3000x1 then 900 units/hr. Check 8 hour HL then daily HL and cbc to start 2-20  cipro 400 mg iv q12h   Janice Coffin 04/15/2011,3:50 AM

## 2011-04-15 NOTE — Progress Notes (Signed)
PT Cancellation Note  Treatment cancelled today due to medical issues with patient which prohibited therapy. Pt with elevated cardiac enzymes and now intubated. Will hold PT eval today. MD please clarify if you would like physical therapy to continue to follow this patient given his medical decline. Thanks!  [facility] HELEN 04/15/2011, 7:58 AM [phone]

## 2011-04-15 NOTE — Progress Notes (Signed)
  Echocardiogram 2D Echocardiogram has been performed.  Daniel Tyler, Real Cons 04/15/2011, 3:29 PM

## 2011-04-15 NOTE — Progress Notes (Signed)
Pt in resp distress, hr and resp elevated, rhonchi and wheezes bilat all lung fields, rapid response and respiratory therapy notified at approx 0030, md cory notified at 0045 and came to floor, abg obtained by rt, port cxr obtained, lasix given and ivf stopped, pt to go on bipap md still here await abg results. [facility] [employer]

## 2011-04-15 NOTE — Progress Notes (Signed)
Subjective:  Intubated and sedated, resting comfortably.  Objective:  Vital Signs in the last 24 hours: BP 83/52  Pulse 70  Temp(Src) 98.7 F (37.1 C) (Oral)  Resp 11  Ht 5\' 9"  (1.753 m)  Wt 64.3 kg (141 lb 12.1 oz)  BMI 20.93 kg/m2  SpO2 98%  Physical Exam: Sedated on vent Lungs:  Clear to A&P Cardiac:  Regular rhythm, normal S1 and S2,  Extremities:  No edema present  Intake/Output from previous day: 02/18 0701 - 02/19 0700 In: 683 [I.V.:413; IV Piggyback:270] Out: 1411 [Urine:1410; Stool:1] Weight change:   Lab Results: Basic Metabolic Panel:  Basename 04/15/11 0400 04/14/11 2136 04/14/11 1748  NA 134* -- 130*  K 3.8 -- 4.4  CL 100 -- 90*  CO2 21 -- 20  GLUCOSE 368* -- 485*  BUN 22 -- 24*  CREATININE 0.94 0.86 --   CBC:  Basename 04/15/11 0400 04/14/11 2136  WBC 10.8* 9.0  NEUTROABS -- --  HGB 12.1* 11.5*  HCT 34.9* 34.1*  MCV 86.4 86.5  PLT 295 266   Cardiac Enzymes:  Basename 04/15/11 1000 04/15/11 0335 04/14/11 2136  CKTOTAL 1417* 1988* 1466*  CKMB 32.4* 14.6* 4.7*  CKMBINDEX -- -- --  TROPONINI 22.75* 6.00* 2.15*   Telemetry: Sinus tachycardia  EKG ST depression laterally new from admission  Assessment/Plan:  1.  Pattern of enzyme rise looks more like a non STEMI probably due to demand ischemia.  Even with total CPK coming down, MB is increasing. 2. DM  REC:  Continue heparin and see what sort of shape he is in after sepsis and pneumonia is treated.  Check ECHO.     Darden Palmer.  MD [facility] 04/15/2011, 1:45 PM

## 2011-04-15 NOTE — Progress Notes (Signed)
ANTICOAGULATION CONSULT NOTE - Follow Up Consult  Pharmacy Consult for heparin  Indication: chest pain/ACS  Vital Signs: Temp: 98.7 F (37.1 C) (02/19 1218) Temp src: Oral (02/19 1218) BP: 83/52 mmHg (02/19 1300) Pulse Rate: 70  (02/19 1300)  Labs:  Basename 04/15/11 1145 04/15/11 1000 04/15/11 0400 04/15/11 0335 04/14/11 2136 04/14/11 1748  HGB -- -- 12.1* -- 11.5* --  HCT -- -- 34.9* -- 34.1* 38.9*  PLT -- -- 295 -- 266 316  APTT -- -- -- -- -- --  LABPROT -- -- -- -- -- --  INR -- -- -- -- -- --  HEPARINUNFRC 0.42 -- -- -- -- --  CREATININE -- -- 0.94 -- 0.86 0.99  CKTOTAL -- 1417* -- 1988* 1466* --  CKMB -- 32.4* -- 14.6* 4.7* --  TROPONINI -- 22.75* -- 6.00* 2.15* --   Estimated Creatinine Clearance: 66.5 ml/min (by C-G formula based on Cr of 0.94).   Medications:  Infusions:    . sodium chloride 20 mL/hr (04/15/11 0930)  . feeding supplement (GLUCERNA 1.2 CAL) 1,000 mL (04/15/11 1253)  . fentaNYL infusion INTRAVENOUS 75 mcg/hr (04/15/11 0535)  . heparin 900 Units/hr (04/15/11 0535)  . midazolam (VERSED) infusion Stopped (04/15/11 0617)  . DISCONTD: sodium chloride 150 mL/hr at 04/14/11 2157  . DISCONTD: propofol Stopped (04/15/11 0500)    Assessment: 70 yom on IV heparin for CP/ACS and elevated troponins. Heparin level is therapeutic at 0.42. No bleeding noted and CBC is stable.   Goal of Therapy:  Heparin level 0.3-0.7 units/ml   Plan:  1. Continue heparin gtt at 900units/hr 2. F/u AM heparin level 3. F/u duration and plan for IV heparin  Carneshia Raker, [name] 04/15/2011,1:15 PM

## 2011-04-15 NOTE — Procedures (Signed)
Name:  [name] MRN:  [id number] DOB:  1940/11/26  PROCEDURE NOTE  Procedure:  Central venous catheter placement.  Indications:  Need for intravenous access and hemodynamic monitoring.  Consent:  Consent was implied due to the emergency nature of the procedure.  Anesthesia:  A total of 10 mL of 1% Lidocaine was used for local infiltration anesthesia.  Procedure summary:  Appropriate equipment was assembled.  The patient was identified as Daniel Tyler and safety timeout was performed. The patient was placed in Trendelenburg position.  Sterile technique was used. The patient's right anterior chest wall was prepped using chlorhexidine / alcohol scrub and the field was draped in usual sterile fashion with full body drape. After the adequate anesthesia was achieved, the right subclavian vein was cannulated with the introducer needle without difficulty. A guide wire was advanced through the introducer needle, which was then withdrawn. A small skin incision was made at the point of wire entry, the dilator was inserted over the guide wire and appropriate dilation was obtained. The dilator was removed and a triple-lumen catheter was advanced over the guide wire, which was then removed.  All ports were aspirated and flushed with normal saline without difficulty. The catheter was secured into place. Antibiotic patch was placed and sterile dressing was applied. Post-procedure chest x-ray was ordered.  Complications:  No immediate complications were noted.  Hemodynamic parameters and oxygenation remained stable throughout the procedure.  Estimated blood loss:  Less then 5 mL.  Orlean Bradford, [name] Pulmonary and Critical Care Medicine [facility] Cell: [phone] Pager: [phone]    04/15/2011, 3:48 AM

## 2011-04-15 NOTE — Progress Notes (Signed)
Called to assist with care of patient in acute respiratory distress. Admission history was reviewed with unit RN. Patient was in moderate distress unable to speak complete sentences. Skin warm warm and slightly diaphoretic. Lungs with audible rhonchi. Heart sounds regular but tachycardic at rate of 120's. On-call residents were made aware of patients condition. Dr. Denyse Amass responded quickly to patients bedside. Orders were given to administer Lasix 20mg  IVP, ABG and Portable chest x-ray. Patient condition continue to deteriorate. Dr. Denyse Amass then consulted CCM. Dr. De Burrs came to bedside to assist with care. Patient required immediate intubation. Patient was given Amidate 20mg  IVP and intubated using the glide-o-scope by Dr. De Burrs without difficulty. After placement of ETT was performed, patient was placed on ventilator by respiratory therapy. Patient was then taken to 2114.

## 2011-04-15 NOTE — Procedures (Signed)
Name: Daniel Tyler MRN: [id number] DOB: 08-Mar-1940   PROCEDURE NOTE  Procedure:  Endotracheal intubation.  Indication:  Acute respiratory failure  Consent:  Consent was implied due to the emergency nature of the procedure.  Anesthesia:  A total of 10 mg of Etomidate was given intravenously.  Procedure summary:  Appropriate equipment was assembled. The patient was identified as Daniel Tyler and safety timeout was performed. The patient was placed supine, with head in sniffing position. After adequate level of anesthesia was achieved, a GlydeScope #3 blade was inserted into the oropharynx and the vocal cords were visualized. A 8.0 endotracheal tube was inserted without difficulty and visualized going through the vocal cords. The stylette was removed and cuff inflated. Colorimetric change was noted on the CO2 meter. Breath sounds were heard over both lung fields equally. Post procedure chest xray was ordered.  Complications:  No immediate complications were noted.  Hemodynamic parameters and oxygenation remained stable throughout the procedure.    Orlean Bradford, [name] Pulmonary and Critical Care Medicine [facility] Cell: [phone] Pager: [phone]  04/15/2011, 3:46 AM

## 2011-04-15 NOTE — Progress Notes (Signed)
PT WAS INTUBATED,OG PLACED BY MD AND FOLEY CATH INSERTED PER VERBAL ORDER AND PLACED ON VENTILATOR AND TRANSFERRED TO 2114, REPORT CALLED TO DAVIS ON UNIT 2100 WHILE RT MD AND RAPID RESPONSE TRANSFERRED PT TO 2114.Falon Huesca RN

## 2011-04-15 NOTE — Progress Notes (Signed)
Update: Pt worsened on Veni mask. CCM called who agreed for transfer and intubated the patient. Discussed the case with the CCM team.   Daray Polgar 2:08 AM

## 2011-04-15 NOTE — Progress Notes (Signed)
INITIAL ADULT NUTRITION ASSESSMENT Date: 04/15/2011   Time: 11:07 AM  Reason for Assessment: MD Consult for enteral nutrition initiation and management  ASSESSMENT: Male 71 y.o.  Dx: cough, dyspnea, fall; found to have PNA; developed worsening respiratory distress with elevated cardiac enzymes; intubated early this morning.  Hx:  Past Medical History  Diagnosis Date  . Diabetes mellitus   . Enlarged heart    Related Meds:  Scheduled Meds:   . acetaminophen  1,000 mg Oral To Major  . antiseptic oral rinse  15 mL Mouth Rinse QID  . aspirin  162 mg Per Tube Daily  . chlorhexidine  15 mL Mouth/Throat BID  . chlorhexidine  15 mL Mouth Rinse BID  . ciprofloxacin  400 mg Intravenous Q12H  . etomidate  20 mg Intravenous STAT  . furosemide  20 mg Intravenous STAT  . furosemide  80 mg Intravenous Once  . heparin  3,000 Units Intravenous Once  . insulin aspart  0-15 Units Subcutaneous Q4H  . metoprolol tartrate  25 mg Per Tube Q12H  . pantoprazole sodium  40 mg Per Tube Q24H  . piperacillin-tazobactam (ZOSYN)  IV  3.375 g Intravenous Once  . piperacillin-tazobactam (ZOSYN)  IV  3.375 g Intravenous Q8H  . propofol      . simvastatin  40 mg Per Tube q1800  . sodium chloride  1,000 mL Intravenous Once  . sodium chloride  1,000 mL Intravenous Once  . sodium chloride      . sodium chloride      . sodium chloride      . vancomycin  1,000 mg Intravenous Once  . vancomycin  1,000 mg Intravenous Q12H  . DISCONTD: antiseptic oral rinse  15 mL Mouth Rinse BID  . DISCONTD: aspirin EC  81 mg Oral Daily  . DISCONTD: aspirin  162.5 mg Per Tube Daily  . DISCONTD: guaiFENesin  600 mg Oral BID  . DISCONTD: heparin  5,000 Units Subcutaneous Q8H  . DISCONTD: insulin aspart  0-5 Units Subcutaneous QHS  . DISCONTD: insulin aspart  0-9 Units Subcutaneous TID WC  . DISCONTD: insulin glargine  5 Units Subcutaneous QHS  . DISCONTD: metoprolol  5 mg Intravenous Q6H  . DISCONTD: pantoprazole (PROTONIX)  IV  40 mg Intravenous Q24H  . DISCONTD: vancomycin  1,000 mg Intravenous Q18H   Continuous Infusions:   . sodium chloride 20 mL/hr (04/15/11 0930)  . fentaNYL infusion INTRAVENOUS 75 mcg/hr (04/15/11 0535)  . heparin 900 Units/hr (04/15/11 0535)  . midazolam (VERSED) infusion Stopped (04/15/11 0617)  . DISCONTD: sodium chloride 150 mL/hr at 04/14/11 2157  . DISCONTD: propofol Stopped (04/15/11 0500)   PRN Meds:.acetaminophen, albuterol, DISCONTD: acetaminophen, DISCONTD: acetaminophen   Ht: 5\' 9"  (175.3 cm)  Wt: 141 lb 12.1 oz (64.3 kg)  Ideal Wt: 72.7 kg % Ideal Wt: 88%  Wt Readings from Last 3 Encounters:  04/15/11 141 lb 12.1 oz (64.3 kg)   Usual Wt: unknown  Body mass index is 20.93 kg/(m^2). Normal weight.  Food/Nutrition Related Hx: no nutrition problems identified on admission nutrition screening  Labs:  CMP     Component Value Date/Time   NA 134* 04/15/2011 0400   K 3.8 04/15/2011 0400   CL 100 04/15/2011 0400   CO2 21 04/15/2011 0400   GLUCOSE 368* 04/15/2011 0400   BUN 22 04/15/2011 0400   CREATININE 0.94 04/15/2011 0400   CALCIUM 8.9 04/15/2011 0400   PROT 7.0 04/15/2011 0400   ALBUMIN 2.8* 04/15/2011 0400   AST  50* 04/15/2011 0400   ALT 15 04/15/2011 0400   ALKPHOS 39 04/15/2011 0400   BILITOT 0.4 04/15/2011 0400   GFRNONAA 83* 04/15/2011 0400   GFRAA >90 04/15/2011 0400   CBG (last 3)   Basename 04/15/11 0837 04/15/11 0242 04/14/11 2218  GLUCAP 273* 349* 479*    Intake/Output Summary (Last 24 hours) at 04/15/11 1113 Last data filed at 04/15/11 0905  Gross per 24 hour  Intake    752 ml  Output   1511 ml  Net   -759 ml   Diet Order: NPO  IVF:    sodium chloride Last Rate: 20 mL/hr (04/15/11 0930)  fentaNYL infusion INTRAVENOUS Last Rate: 75 mcg/hr (04/15/11 0535)  heparin Last Rate: 900 Units/hr (04/15/11 0535)  midazolam (VERSED) infusion Last Rate: Stopped (04/15/11 0617)  DISCONTD: sodium chloride Last Rate: 150 mL/hr at 04/14/11 2157  DISCONTD:  propofol Last Rate: Stopped (04/15/11 0500)    Estimated Nutritional Needs:   Kcal: 1785  Protein: 80-95 grams  Fluid: 1.8-2 liters  Propofol has been discontinued.  OG tube in place.  Received consult for enteral nutrition initiation and management.  NUTRITION DIAGNOSIS: -Inadequate oral intake (NI-2.1).  Status: Ongoing  RELATED TO: inability to eat  AS EVIDENCED BY: NPO status  MONITORING/EVALUATION(Goals): Goal:  TF to meet 90-100% of estimated needs.  Monitor:  TF tolerance/adequacy, labs, weight trend.  EDUCATION NEEDS: -Education not appropriate at this time  INTERVENTION: Initiate TF via OG tube with Glucerna 1.2 at 20 ml/h, increase by 10 ml every 4 hours to goal of 60 ml/h to provide 1728 kcals, 86 grams protein, 1159 ml free water daily.  Dietitian #:  [phone]  DOCUMENTATION CODES Per approved criteria  -Not Applicable    Hettie Holstein 04/15/2011, 11:07 AM

## 2011-04-16 ENCOUNTER — Inpatient Hospital Stay (HOSPITAL_COMMUNITY): Payer: Medicare Other

## 2011-04-16 LAB — GLUCOSE, CAPILLARY
Glucose-Capillary: 192 mg/dL — ABNORMAL HIGH (ref 70–99)
Glucose-Capillary: 196 mg/dL — ABNORMAL HIGH (ref 70–99)
Glucose-Capillary: 251 mg/dL — ABNORMAL HIGH (ref 70–99)
Glucose-Capillary: 345 mg/dL — ABNORMAL HIGH (ref 70–99)

## 2011-04-16 LAB — BASIC METABOLIC PANEL
CO2: 23 mEq/L (ref 19–32)
Chloride: 100 mEq/L (ref 96–112)
Creatinine, Ser: 0.81 mg/dL (ref 0.50–1.35)
GFR calc Af Amer: >90 mL/min (ref >90)
Potassium: 4.2 mEq/L (ref 3.5–5.1)
Sodium: 133 mEq/L — ABNORMAL LOW (ref 135–145)

## 2011-04-16 LAB — HEPARIN LEVEL (UNFRACTIONATED)
Heparin Unfractionated: <0.1 IU/mL — ABNORMAL LOW (ref 0.30–0.70)
Heparin Unfractionated: 0.29 IU/mL — ABNORMAL LOW (ref 0.30–0.70)

## 2011-04-16 LAB — CBC
HCT: 30.3 % — ABNORMAL LOW (ref 39.0–52.0)
Hemoglobin: 10 g/dL — ABNORMAL LOW (ref 13.0–17.0)
MCV: 88.1 fL (ref 78.0–100.0)
RBC: 3.44 MIL/uL — ABNORMAL LOW (ref 4.22–5.81)
RDW: 14.5 % (ref 11.5–15.5)
WBC: 9.8 10*3/uL (ref 4.0–10.5)

## 2011-04-16 MED ORDER — FUROSEMIDE 10 MG/ML IJ SOLN
INTRAMUSCULAR | Status: AC
  Administered 2011-04-16: 80 mg
  Filled 2011-04-16: qty 8

## 2011-04-16 MED ORDER — HEPARIN BOLUS VIA INFUSION
1000.0000 [IU] | Freq: Once | INTRAVENOUS | Status: AC
  Administered 2011-04-16: 1000 [IU] via INTRAVENOUS
  Filled 2011-04-16: qty 1000

## 2011-04-16 MED ORDER — MIDAZOLAM HCL 2 MG/2ML IJ SOLN
INTRAMUSCULAR | Status: AC
  Filled 2011-04-16: qty 4

## 2011-04-16 MED ORDER — HEPARIN BOLUS VIA INFUSION
2000.0000 [IU] | Freq: Once | INTRAVENOUS | Status: AC
  Administered 2011-04-16: 2000 [IU] via INTRAVENOUS
  Filled 2011-04-16: qty 2000

## 2011-04-16 MED ORDER — FENTANYL CITRATE 0.05 MG/ML IJ SOLN
INTRAMUSCULAR | Status: AC
  Filled 2011-04-16: qty 4

## 2011-04-16 MED ORDER — SODIUM CHLORIDE 0.9 % IJ SOLN
INTRAMUSCULAR | Status: AC
  Administered 2011-04-16: 10 mL
  Filled 2011-04-16: qty 10

## 2011-04-16 MED ORDER — ETOMIDATE 2 MG/ML IV SOLN
INTRAVENOUS | Status: AC
  Filled 2011-04-16: qty 10

## 2011-04-16 NOTE — Progress Notes (Signed)
Inpatient Diabetes Program Recommendations  AACE/ADA: New Consensus Statement on Inpatient Glycemic Control (2009)  Target Ranges:  Prepandial:   less than 140 mg/dL      Peak postprandial:   less than 180 mg/dL (1-2 hours)      Critically ill patients:  140 - 180 mg/dL   Results for RENEL, ENDE (MRN [id number]) as of 04/16/2011 13:24  Ref. Range 04/16/2011 00:31 04/16/2011 04:12 04/16/2011 08:18  Glucose-Capillary Latest Range: 70-99 mg/dL 147 (H) 829 (H) 562 (H)   Noted patient extubated today.  Inpatient Diabetes Program Recommendations Insulin - Basal: Please start patient's home Lantus dose- Lantus 15 units QHS.  Note: Will follow. Ambrose Finland RN, MSN, CDE Diabetes Coordinator Inpatient Diabetes Program [phone]

## 2011-04-16 NOTE — Progress Notes (Signed)
ANTICOAGULATION CONSULT NOTE - Follow Up Consult  Pharmacy Consult for heparin Indication: chest pain/ACS, NSTEMI  Vital Signs: Temp: 98.5 F (36.9 C) (02/20 2000) Temp src: Oral (02/20 2000) BP: 148/70 mmHg (02/20 2230) Pulse Rate: 106  (02/20 2230)  Labs:  Basename 04/16/11 2230 04/16/11 1317 04/16/11 0500 04/15/11 1000 04/15/11 0400 04/15/11 0335 04/14/11 2136  HGB -- -- 10.0* -- 12.1* -- --  HCT -- -- 30.3* -- 34.9* -- 34.1*  PLT -- -- 278 -- 295 -- 266  APTT -- -- -- -- -- -- --  LABPROT -- -- -- -- -- -- --  INR -- -- -- -- -- -- --  HEPARINUNFRC 0.29* 0.20* <0.10* -- -- -- --  CREATININE -- -- 0.81 -- 0.94 -- 0.86  CKTOTAL -- -- -- 1417* -- 1988* 1466*  CKMB -- -- -- 32.4* -- 14.6* 4.7*  TROPONINI -- -- -- 22.75* -- 6.00* 2.15*   Estimated Creatinine Clearance: 80.1 ml/min (by C-G formula based on Cr of 0.81).   Medications:  Infusions:     . sodium chloride 20 mL/hr (04/15/11 0930)  . heparin 1,350 Units/hr (04/16/11 1430)  . DISCONTD: feeding supplement (GLUCERNA 1.2 CAL) 1,000 mL (04/15/11 1253)  . DISCONTD: fentaNYL infusion INTRAVENOUS Stopped (04/16/11 0900)  . DISCONTD: midazolam (VERSED) infusion Stopped (04/15/11 0617)    Assessment: [name]. Heparin level (0.29) is slightly below-goal on 1350 units/hr. No problem with line per RN.   Goal of Therapy:  Heparin level 0.3-0.7 units/ml   Plan:  1. Increase IV heparin infusion to 1450 units/hr.  2. Follow-up AM heparin level, CBC.   Daniel Tyler 04/16/2011,11:54 PM

## 2011-04-16 NOTE — Progress Notes (Signed)
Physical Therapy Discharge Note  Noted decline in medical status from the time of PT consult. Will sign off at this time. RN aware. Please reorder when pt more appropriate for therapy services.  Ivonne Andrew PT, DPT  [phone]

## 2011-04-16 NOTE — Procedures (Signed)
Extubation Procedure Note  Patient Details:   Name: Daniel Tyler DOB: November 29, 1940 MRN: [id number]   Airway Documentation:  Airway 8 mm (Active)  Secured at (cm) 24 cm 04/16/2011  8:45 AM  Measured From Lips 04/16/2011  8:45 AM  Secured Location Center 04/16/2011  8:45 AM  Secured By [employer] 04/16/2011  8:45 AM  Tube Holder Repositioned Yes 04/16/2011  8:45 AM  Cuff Pressure (cm H2O) 22 cm H2O 04/16/2011  8:45 AM  Site Condition Dry 04/16/2011  8:45 AM    Evaluation  O2 sats: stable throughout Complications: No apparent complications Patient did tolerate procedure well. Bilateral Breath Sounds: Clear Suctioning: Airway Yes Pt vocalizing well with no stridor noted at this time.   Fara Olden 04/16/2011, 1005

## 2011-04-16 NOTE — Progress Notes (Signed)
Daniel Tyler is a 71 y.o. male admitted on 04/14/2011 with cough and dyspnea, and fall.  He had emesis, and fever in ED with low BP.  He was initially DNR/DNI.  He developed worsening respiratory distress and found to have elevated cardiac enzymes, and later reversed code status.  He was intubated and transferred to ICU. PMHx DM, Cardiomegaly  Line/tube: 2/19 ETT>> 2/19 Rt [place] CVL>>  Cultures: Urine 2/18>> negative. Blood 2/18>> NGTD >>>  Antibiotics: Vancomycin 2/18>> Zosyn 2/18>> Cipro 2/18>> 2/20  Tests/events: 2/18 CT head>>no acute process 2/18 CT neck>>no neck fracture 2/19 Echo>> EF 30-35%, moderate diffuse hypokinesis, severe hypokinesis of inferior myocardium; mild to mod mitral valve regurg  SUBJECTIVE: Tolerating pressure support. Awake, shakes head "no" to questions about pain.  Appears ready for extubation.   OBJECTIVE:  Blood pressure 115/66, pulse 87, temperature 99.8 F (37.7 C), temperature source Oral, resp. rate 15, height 5\' 9"  (1.753 m), weight 147 lb 0.8 oz (66.7 kg), SpO2 100.00%. Wt Readings from Last 3 Encounters:  04/16/11 147 lb 0.8 oz (66.7 kg)   Body mass index is 21.72 kg/(m^2).  I/O last 3 completed shifts: In: 3115 [I.V.:1252.5; NG/GT:680; IV Piggyback:1182.5] Out: 2178 [Urine:2177; Stool:1]  Vent Mode:  [-] PRVC FiO2 (%):  [39.6 %-50 %] 40.3 % Set Rate:  [15 bmp] 15 bmp Vt Set:  [550 mL] 550 mL PEEP:  [4.7 cmH20-5 cmH20] 5 cmH20 Pressure Support:  [8 cmH20] 8 cmH20 Plateau Pressure:  [12 cmH20-22 cmH20] 14 cmH20  General - no distress HEENT - ETT in place Cardiac - s1s2 regular Chest - basilar rales Abd - soft, nontender Ext - no edema Neuro - awake, alert  CBC    Component Value Date/Time   WBC 9.8 04/16/2011 0500   RBC 3.44* 04/16/2011 0500   HGB 10.0* 04/16/2011 0500   HCT 30.3* 04/16/2011 0500   PLT 278 04/16/2011 0500   MCV 88.1 04/16/2011 0500   MCH 29.1 04/16/2011 0500   MCHC 33.0 04/16/2011 0500   RDW 14.5 04/16/2011 0500      BMET    Component Value Date/Time   NA 133* 04/16/2011 0500   K 4.2 04/16/2011 0500   CL 100 04/16/2011 0500   CO2 23 04/16/2011 0500   GLUCOSE 296* 04/16/2011 0500   BUN 29* 04/16/2011 0500   CREATININE 0.81 04/16/2011 0500   CALCIUM 8.7 04/16/2011 0500   GFRNONAA 88* 04/16/2011 0500   GFRAA >90 04/16/2011 0500   ABG    Component Value Date/Time   PHART 7.330* 04/15/2011 0330   PCO2ART 35.4 04/15/2011 0330   PO2ART 176.0* 04/15/2011 0330   HCO3 18.5* 04/15/2011 0330   TCO2 19 04/15/2011 0330   ACIDBASEDEF 6.0* 04/15/2011 0330   O2SAT 99.0 04/15/2011 0330    Ct Head Wo Contrast  04/14/2011  *RADIOLOGY REPORT*  Clinical Data:  Weakness.  Fall  CT HEAD WITHOUT CONTRAST CT CERVICAL SPINE WITHOUT CONTRAST  Technique:  Multidetector CT imaging of the head and cervical spine was performed following the standard protocol without intravenous contrast.  Multiplanar CT image reconstructions of the cervical spine were also generated.  Comparison:   None  CT HEAD  Findings: Age appropriate atrophy.  Negative for hemorrhage. Negative for acute infarct or mass.  Chronic sinusitis with mucosal thickening in the paranasal sinuses. Calvarium is intact.  IMPRESSION: No acute intracranial abnormality.  Sinusitis.  CT CERVICAL SPINE  Findings: Negative for fracture.  Cervical disc degeneration and moderate to advanced spondylosis at C3-4, C4-5, and  C5-6.  Milder spondylosis at C6-7.  Carotid atherosclerotic calcification is present.  IMPRESSION: Negative for fracture.  Original Report Authenticated By: Camelia Phenes, [name]   Ct Cervical Spine Wo Contrast  04/14/2011  *RADIOLOGY REPORT*  Clinical Data:  Weakness.  Fall  CT HEAD WITHOUT CONTRAST CT CERVICAL SPINE WITHOUT CONTRAST  Technique:  Multidetector CT imaging of the head and cervical spine was performed following the standard protocol without intravenous contrast.  Multiplanar CT image reconstructions of the cervical spine were also generated.  Comparison:    None  CT HEAD  Findings: Age appropriate atrophy.  Negative for hemorrhage. Negative for acute infarct or mass.  Chronic sinusitis with mucosal thickening in the paranasal sinuses. Calvarium is intact.  IMPRESSION: No acute intracranial abnormality.  Sinusitis.  CT CERVICAL SPINE  Findings: Negative for fracture.  Cervical disc degeneration and moderate to advanced spondylosis at C3-4, C4-5, and C5-6.  Milder spondylosis at C6-7.  Carotid atherosclerotic calcification is present.  IMPRESSION: Negative for fracture.  Original Report Authenticated By: Camelia Phenes, [name]   Dg Chest Port 1 View  04/15/2011  *RADIOLOGY REPORT*  Clinical Data: Central line placement.  PORTABLE CHEST - 1 VIEW  Comparison: Chest radiograph performed earlier today at 01:09 a.m.  Findings: The patient's right subclavian line is noted ending about the mid to distal SVC.  The endotracheal tube is seen ending 5-6 cm above the carina.  An enteric tube is noted extending below the diaphragm.  The lungs are well-aerated.  There is worsened right basilar airspace opacification, and slightly worsened mild left basilar airspace opacity, concerning for multifocal pneumonia.  There is no evidence of pleural effusion or pneumothorax.  The heart is normal in size; the mediastinal contour is within normal limits.  No acute osseous abnormalities are seen.  IMPRESSION:  1.  Right subclavian line noted ending about the mid to distal SVC. 2.  Worsening multifocal pneumonia, particularly at the right lung base. 3.  Endotracheal tube ends 5-6 cm above the carina.  Original Report Authenticated By: Tonia Ghent, [name]   Dg Chest Port 1 View  04/15/2011  *RADIOLOGY REPORT*  Clinical Data: Shortness of breath.  Clinical concern for fluid overload.  PORTABLE CHEST - 1 VIEW  Comparison: 04/14/2011.  Findings: Significant increase in right lower lobe airspace opacity with interval mild left basilar airspace opacity.  Normal sized heart.  No pleural fluid.   Unremarkable bones.  IMPRESSION:  1.  Worsening right lower lobe pneumonia. 2.  Interval probable pneumonia at the left lung base.  Original Report Authenticated By: Darrol Angel, [name]   Dg Chest Port 1 View  04/14/2011  *RADIOLOGY REPORT*  Clinical Data: Right-sided chest pain  PORTABLE CHEST - 1 VIEW  Comparison: None.  Findings: Right lower lobe airspace disease, possible pneumonia.  COPD with hyperinflation.  Apical emphysema.  No pleural effusion. Vascularity is normal.  IMPRESSION: COPD.  Right lower lobe infiltrate, suspicious for pneumonia.  Original Report Authenticated By: Camelia Phenes, [name]    ASSESSMENT/PLAN:  1. Acute respiratory failure: 2nd to pneumonia -tolerating PS well, mental status improved, could be ready for extubation today -f/u CXR -albuterol PRN  2. Elevated troponins: NSTEMI vs demand ischemia -cardiology following, appreciate input  -f/u Echo --> EF 30-35%, diffuse mild hypokinesis with severe hypokinesis in inferior myocardium  -continue lopressor, ASA, zocor -heparin gtt per cardiology  3. Pneumonia with concern for aspiration -D3/x zoxyn, vancomycin, cipro -D/c cipro, unlikely to be pseudomonas so no need for  double coverage -WBC improved   Lab 04/16/11 0500 04/15/11 0400 04/14/11 2136 04/14/11 1748  WBC 9.8 10.8* 9.0 10.6*    4. DM -SSI  5. Protein calorie malnutrition -will likely be able to start taking clears today, and full po tomorrow if mental status remains improved   6. Sedation -sedation protocol while on vent -wakes up well, good mental status  Best practice -Protonix for SUP -Hep gtt for SVT ppx -No family available for update (patient doesn't have family)  MCGILL,JACQUELYN 04/16/2011, 8:14 AM   Reviewed above, examined pt and agree with assessment/plan.  Successfully extubated this morning.  Continue Abx for PNA.  Defer to cardiology when he may need further assessment of his coronaries.  Coralyn Helling, MD 04/16/2011,  12:26 PM Pager:  [phone]

## 2011-04-16 NOTE — Procedures (Signed)
Name: Rudolpho Claxton MRN: [id number] DOB: June 14, 1940   PROCEDURE NOTE  Procedure:  Endotracheal intubation.  Indication:  Acute respiratory failure  Consent:  Consent was implied due to the emergency nature of the procedure.  Anesthesia:  A total of 10 mg of Etomidate was given intravenously.  Procedure summary:  Appropriate equipment was assembled. The patient was identified as Daniel Tyler and safety timeout was performed. The patient was placed supine, with head in sniffing position. After adequate level of anesthesia was achieved, a Mac 3 blade was inserted into the oropharynx and the vocal cords were visualized. A 8.0 endotracheal tube was inserted without difficulty and visualized going through the vocal cords. The stylette was removed and cuff inflated. Colorimetric change was noted on the CO2 meter. Breath sounds were heard over both lung fields equally. Post procedure chest xray was ordered.  Complications:  No immediate complications were noted.  Hemodynamic parameters and oxygenation remained stable throughout the procedure.    Orlean Bradford, [name] Pulmonary and Critical Care Medicine [facility] Cell: [phone] Pager: [phone]  04/16/2011, 10:53 PM

## 2011-04-16 NOTE — Progress Notes (Signed)
Subjective:  Pt. Is more alert today.  Able to answer questions.  Not SOB.  C/o thirst, wants ET tube out.  No chest pain.  Objective:  Vital Signs in the last 24 hours: BP 123/64  Pulse 93  Temp(Src) 99.6 F (37.6 C) (Oral)  Resp 14  Ht 5\' 9"  (1.753 m)  Wt 66.7 kg (147 lb 0.8 oz)  BMI 21.72 kg/m2  SpO2 100%  Physical Exam: Elderly WM alert comfortable Lungs:  Decreased BS Cardiac:  Regular rhythm, normal S1 and S2, no S3, 1-2 6 systolic murmur Extremities:  No edema present  Intake/Output from previous day: 02/19 0701 - 02/20 0700 In: 2527 [I.V.:874.5; NG/GT:740; IV Piggyback:912.5] Out: 767 [Urine:767] Weight change: -5.875 kg (-12 lb 15.3 oz)  Lab Results: Basic Metabolic Panel:  Basename 04/16/11 0500 04/15/11 0400  NA 133* 134*  K 4.2 3.8  CL 100 100  CO2 23 21  GLUCOSE 296* 368*  BUN 29* 22  CREATININE 0.81 0.94   CBC:  Basename 04/16/11 0500 04/15/11 0400  WBC 9.8 10.8*  NEUTROABS -- --  HGB 10.0* 12.1*  HCT 30.3* 34.9*  MCV 88.1 86.4  PLT 278 295   Cardiac Enzymes:  Basename 04/15/11 1000 04/15/11 0335 04/14/11 2136  CKTOTAL 1417* 1988* 1466*  CKMB 32.4* 14.6* 4.7*  CKMBINDEX -- -- --  TROPONINI 22.75* 6.00* 2.15*   Telemetry: Sinus rhythm No sig arrhythmias    Assessment/Plan:  1. NSTEMI  Enzyme pattern and EKG are suggestive of this.  May be related to sepsis and stress but is at high risk of underlying CAD b/o diabetes.  W/u in future will depend on clinical course and functional status 2. Diabetes  REC:  Continue heparin.  Beta blocker.  Begin ACE when stable.  Once extubated will determine what w/u needed. Check BNP.  Darden Palmer.  MD [facility] 04/16/2011, 9:27 AM

## 2011-04-16 NOTE — Progress Notes (Signed)
ANTICOAGULATION CONSULT NOTE - Follow Up Consult  Pharmacy Consult for heparin Indication: chest pain/ACS, NSTEMI  Vital Signs: Temp: 98 F (36.7 C) (02/20 1202) Temp src: Oral (02/20 1202) BP: 132/67 mmHg (02/20 1400) Pulse Rate: 98  (02/20 1400)  Labs:  Basename 04/16/11 1317 04/16/11 0500 04/15/11 1145 04/15/11 1000 04/15/11 0400 04/15/11 0335 04/14/11 2136  HGB -- 10.0* -- -- 12.1* -- --  HCT -- 30.3* -- -- 34.9* -- 34.1*  PLT -- 278 -- -- 295 -- 266  APTT -- -- -- -- -- -- --  LABPROT -- -- -- -- -- -- --  INR -- -- -- -- -- -- --  HEPARINUNFRC 0.20* <0.10* 0.42 -- -- -- --  CREATININE -- 0.81 -- -- 0.94 -- 0.86  CKTOTAL -- -- -- 1417* -- 1988* 1466*  CKMB -- -- -- 32.4* -- 14.6* 4.7*  TROPONINI -- -- -- 22.75* -- 6.00* 2.15*   Estimated Creatinine Clearance: 80.1 ml/min (by C-G formula based on Cr of 0.81).   Medications:  Infusions:    . sodium chloride 20 mL/hr (04/15/11 0930)  . heparin 1,200 Units/hr (04/16/11 0734)  . DISCONTD: feeding supplement (GLUCERNA 1.2 CAL) 1,000 mL (04/15/11 1253)  . DISCONTD: fentaNYL infusion INTRAVENOUS Stopped (04/16/11 0900)  . DISCONTD: midazolam (VERSED) infusion Stopped (04/15/11 0617)    Assessment: [name]. Heparin level remains subtherapeutic at 0.2. Cardiology following and wants to continue heparin gtt for now.   Goal of Therapy:  Heparin level 0.3-0.7 units/ml   Plan:  1. Heparin bolus 1000units IV x 1 2. Increase heparin gtt to 1350 units/hr 3. Check 8 hour heparin level  Takisha Pelle, [name] 04/16/2011,2:30 PM

## 2011-04-16 NOTE — Progress Notes (Signed)
ANTICOAGULATION CONSULT NOTE - Follow Up Consult  Pharmacy Consult for heparin Indication: chest pain/ACS/?NSTEMI  Labs:  Basename 04/16/11 0500 04/15/11 1145 04/15/11 1000 04/15/11 0400 04/15/11 0335 04/14/11 2136  HGB 10.0* -- -- 12.1* -- --  HCT 30.3* -- -- 34.9* -- 34.1*  PLT 278 -- -- 295 -- 266  APTT -- -- -- -- -- --  LABPROT -- -- -- -- -- --  INR -- -- -- -- -- --  HEPARINUNFRC <0.10* 0.42 -- -- -- --  CREATININE 0.81 -- -- 0.94 -- 0.86  CKTOTAL -- -- 1417* -- 1988* 1466*  CKMB -- -- 32.4* -- 14.6* 4.7*  TROPONINI -- -- 22.75* -- 6.00* 2.15*   Assessment: 71yo male now undetectable on heparin after one therapeutic level (may have been due to bolus effect);  thought to be NSTEMI on presentation, cards will be reassessing as sepsis/PNA improves.  Goal of Therapy:  Heparin level 0.3-0.7 units/ml   Plan:  Will give heparin bolus of 2000 units and increase gtt by 4 units/kg/hr to 1200 units/hr and check level in 6-8hr.  Colleen Can PharmD BCPS 04/16/2011,6:30 AM

## 2011-04-17 ENCOUNTER — Inpatient Hospital Stay (HOSPITAL_COMMUNITY): Payer: Medicare Other

## 2011-04-17 DIAGNOSIS — E1165 Type 2 diabetes mellitus with hyperglycemia: Secondary | ICD-10-CM

## 2011-04-17 DIAGNOSIS — J159 Unspecified bacterial pneumonia: Secondary | ICD-10-CM

## 2011-04-17 DIAGNOSIS — J9600 Acute respiratory failure, unspecified whether with hypoxia or hypercapnia: Secondary | ICD-10-CM

## 2011-04-17 DIAGNOSIS — I200 Unstable angina: Secondary | ICD-10-CM

## 2011-04-17 LAB — CBC
MCHC: 34.4 g/dL (ref 30.0–36.0)
Platelets: 323 10*3/uL (ref 150–400)
RDW: 13.9 % (ref 11.5–15.5)
WBC: 13.6 10*3/uL — ABNORMAL HIGH (ref 4.0–10.5)

## 2011-04-17 LAB — HEPARIN LEVEL (UNFRACTIONATED)
Heparin Unfractionated: 0.35 IU/mL (ref 0.30–0.70)
Heparin Unfractionated: 0.26 IU/mL — ABNORMAL LOW (ref 0.30–0.70)
Heparin Unfractionated: 0.39 IU/mL (ref 0.30–0.70)

## 2011-04-17 LAB — POCT I-STAT 3, ART BLOOD GAS (G3+)
Patient temperature: 98.4
TCO2: 24 mmol/L (ref 0–100)
pH, Arterial: 7.409 (ref 7.350–7.450)

## 2011-04-17 LAB — GLUCOSE, CAPILLARY
Glucose-Capillary: 167 mg/dL — ABNORMAL HIGH (ref 70–99)
Glucose-Capillary: 182 mg/dL — ABNORMAL HIGH (ref 70–99)
Glucose-Capillary: 202 mg/dL — ABNORMAL HIGH (ref 70–99)
Glucose-Capillary: 296 mg/dL — ABNORMAL HIGH (ref 70–99)
Glucose-Capillary: 83 mg/dL (ref 70–99)

## 2011-04-17 LAB — BASIC METABOLIC PANEL
Chloride: 98 mEq/L (ref 96–112)
Creatinine, Ser: 0.67 mg/dL (ref 0.50–1.35)
GFR calc Af Amer: >90 mL/min (ref >90)
GFR calc non Af Amer: >90 mL/min (ref >90)
Potassium: 3 mEq/L — ABNORMAL LOW (ref 3.5–5.1)

## 2011-04-17 MED ORDER — PROPOFOL 10 MG/ML IV EMUL
5.0000 ug/kg/min | INTRAVENOUS | Status: DC
  Administered 2011-04-17: 15 ug/kg/min via INTRAVENOUS
  Administered 2011-04-17: 5 ug/kg/min via INTRAVENOUS
  Filled 2011-04-17 (×2): qty 100

## 2011-04-17 MED ORDER — SODIUM CHLORIDE 0.9 % IJ SOLN
INTRAMUSCULAR | Status: AC
  Administered 2011-04-17: 22:00:00
  Filled 2011-04-17: qty 40

## 2011-04-17 MED ORDER — BIOTENE DRY MOUTH MT LIQD
15.0000 mL | Freq: Four times a day (QID) | OROMUCOSAL | Status: DC
  Administered 2011-04-17 – 2011-04-21 (×10): 15 mL via OROMUCOSAL

## 2011-04-17 MED ORDER — POTASSIUM CHLORIDE 20 MEQ/15ML (10%) PO LIQD
40.0000 meq | Freq: Three times a day (TID) | ORAL | Status: AC
  Administered 2011-04-17 (×2): 40 meq
  Filled 2011-04-17 (×2): qty 30

## 2011-04-17 MED ORDER — CHLORHEXIDINE GLUCONATE 0.12 % MT SOLN
15.0000 mL | Freq: Two times a day (BID) | OROMUCOSAL | Status: DC
  Administered 2011-04-17 – 2011-04-21 (×8): 15 mL via OROMUCOSAL
  Filled 2011-04-17 (×10): qty 15

## 2011-04-17 MED ORDER — GLUCERNA 1.2 CAL PO LIQD
1000.0000 mL | ORAL | Status: DC
  Administered 2011-04-17: 1000 mL
  Filled 2011-04-17 (×4): qty 1000

## 2011-04-17 MED ORDER — POTASSIUM CHLORIDE 20 MEQ/15ML (10%) PO LIQD
ORAL | Status: AC
  Administered 2011-04-17: 40 meq
  Filled 2011-04-17: qty 30

## 2011-04-17 MED ORDER — FENTANYL CITRATE 0.05 MG/ML IJ SOLN
25.0000 ug | INTRAMUSCULAR | Status: DC | PRN
  Administered 2011-04-17 (×2): 100 ug via INTRAVENOUS
  Filled 2011-04-17: qty 2

## 2011-04-17 MED ORDER — FENTANYL CITRATE 0.05 MG/ML IJ SOLN
INTRAMUSCULAR | Status: AC
  Filled 2011-04-17: qty 2

## 2011-04-17 NOTE — Significant Event (Signed)
Pt. found lethargic sats 70's  on 40% venti mask. MD called in to RM. Pt reintubated

## 2011-04-17 NOTE — Progress Notes (Signed)
Daniel Tyler is a 71 y.o. male admitted on 04/14/2011 with cough and dyspnea, and fall.  He had emesis, and fever in ED with low BP.  He was initially DNR/DNI.  He developed worsening respiratory distress and found to have elevated cardiac enzymes, and later reversed code status.  He was intubated and transferred to ICU. PMHx DM, Cardiomegaly  Line/tube: 2/19 ETT >> 2/20; 2/20PM  >> 2/19 Rt [place] CVL >> 2/20 OGT >>>  Cultures: Urine 2/18>> negative. Blood 2/18>> NGTD >>  Antibiotics: Vancomycin 2/18 >> 2/21 Zosyn 2/18 >>  Cipro 2/18 >> 2/20  Tests/events: 2/18 CT head>>no acute process 2/18 CT neck>>no neck fracture 2/19 Echo>> EF 30-35%, moderate diffuse hypokinesis, severe hypokinesis of inferior myocardium; mild to mod mitral valve regurg  SUBJECTIVE: Was extubated and doing well until 2/20PM. Need for emergent reintubation for lethargy/decreased mental status.    OBJECTIVE:  Blood pressure 103/60, pulse 84, temperature 98.6 F (37 C), temperature source Oral, resp. rate 16, height 5\' 9"  (1.753 m), weight 142 lb 6.7 oz (64.6 kg), SpO2 100.00%. Wt Readings from Last 3 Encounters:  04/17/11 142 lb 6.7 oz (64.6 kg)   Body mass index is 21.03 kg/(m^2).  I/O last 3 completed shifts: In: 3459.5 [P.O.:540; I.V.:1284.5; NG/GT:660; IV Piggyback:975] Out: 2847 [Urine:2847]  Vent Mode:  [-] PRVC FiO2 (%):  [2 %-40.1 %] 39.9 % Set Rate:  [15 bmp] 15 bmp Vt Set:  [550 mL] 550 mL PEEP:  [4.5 cmH20-5 cmH20] 5 cmH20 Pressure Support:  [5 cmH20] 5 cmH20 Plateau Pressure:  [13 cmH20-20 cmH20] 13 cmH20  General - no distress HEENT - ETT in place Cardiac - s1s2 regular Chest - basilar rales, R worse than L Abd - soft, nontender Ext - no edema Neuro - sedated, getting ready for WUA  CBC    Component Value Date/Time   WBC 13.6* 04/17/2011 0500   RBC 3.37* 04/17/2011 0500   HGB 9.9* 04/17/2011 0500   HCT 28.8* 04/17/2011 0500   PLT 323 04/17/2011 0500   MCV 85.5 04/17/2011 0500   MCH  29.4 04/17/2011 0500   MCHC 34.4 04/17/2011 0500   RDW 13.9 04/17/2011 0500   BMET    Component Value Date/Time   NA 133* 04/16/2011 0500   K 4.2 04/16/2011 0500   CL 100 04/16/2011 0500   CO2 23 04/16/2011 0500   GLUCOSE 296* 04/16/2011 0500   BUN 29* 04/16/2011 0500   CREATININE 0.81 04/16/2011 0500   CALCIUM 8.7 04/16/2011 0500   GFRNONAA 88* 04/16/2011 0500   GFRAA >90 04/16/2011 0500   ABG    Component Value Date/Time   PHART 7.409 04/17/2011 0059   PCO2ART 36.9 04/17/2011 0059   PO2ART 75.0* 04/17/2011 0059   HCO3 23.3 04/17/2011 0059   TCO2 24 04/17/2011 0059   ACIDBASEDEF 1.0 04/17/2011 0059   O2SAT 95.0 04/17/2011 0059    Dg Chest Port 1 View  04/16/2011  *RADIOLOGY REPORT*  Clinical Data: Intubated.  Shortness of breath.  PORTABLE CHEST - 1 VIEW  Comparison: Earlier today.  Findings: Interval endotracheal tube in satisfactory position. Stable right subclavian catheter and nasogastric tube.  Normal sized heart.  Significant increase in patchy airspace opacity in the right mid and lower lung zones.  Increased airspace opacity in the left lower lung zone.  The overall lung volumes remain hyperexpanded.  Interval probable small right pleural effusion.  IMPRESSION:  1.  Endotracheal tube in satisfactory position. 2.  Worsening bilateral pneumonia.  Original Report Authenticated By:  Darrol Angel, [name]   Dg Chest Port 1 View  04/16/2011  *RADIOLOGY REPORT*  Clinical Data: Follow up pneumonia  PORTABLE CHEST - 1 VIEW  Comparison: 04/15/2011  Findings: Cardiomediastinal silhouette is stable.  Stable endotracheal and NG tube position.  Right subclavian catheter is stable in position.  No convincing pulmonary edema.  Hazy infiltrate/pneumonia again noted bilateral basilar right greater than left.  IMPRESSION: Stable support apparatus.  No significant change.  Persistent infiltrate/pneumonia bilateral basilar right greater than left.  Original Report Authenticated By: Natasha Mead, [name]     ASSESSMENT/PLAN:  1. Acute respiratory failure: 2nd to RLL pneumonia -was tolerating PS well with mental status improved early 2/20 --> extubated 2/20 and reintubated 2/20 PM for lethargy, inability to maintain airway -f/u CXR -albuterol PRN, continue abx -retry PSV  Lab 04/17/11 0059 04/15/11 0330 04/15/11 0051 04/14/11 2158  PHART 7.409 7.330* 7.387 7.416  PCO2ART 36.9 35.4 32.1* 27.5*  PO2ART 75.0* 176.0* 62.7* 80.0    2. Elevated troponins: NSTEMI vs demand ischemia -cardiology following, appreciate input  -f/u Echo --> EF 30-35%, diffuse mild hypokinesis with severe hypokinesis in inferior myocardium  -continue lopressor, ASA, zocor -heparin gtt per cardiology   Lab 04/15/11 1000 04/15/11 0335 04/14/11 2136 04/14/11 1810  TROPONINI 22.75* 6.00* 2.15* 1.74*   3. Pneumonia with concern for aspiration -D4/x zoxyn, vancomycin; d/c vanco on 2/21 -D/c'ed cipro 2/20, unlikely to be pseudomonas so no need for double coverage -WBC now increasing  Lab 04/17/11 0500 04/16/11 0500 04/15/11 0400 04/14/11 2136 04/14/11 1748  WBC 13.6* 9.8 10.8* 9.0 10.6*    4. DM -SSI  Lab 04/17/11 0421 04/17/11 0008 04/16/11 2017 04/16/11 1607 04/16/11 1200  GLUCAP 202* 296* 345* 251* 192*   5. Protein calorie malnutrition -Will start tube feeds today if no extubation  6. Sedation -sedation protocol while on vent -daily WUA  Best practice -Protonix for SUP -Hep gtt for SVT ppx -No family available for update (patient doesn't have family)  MCGILL,JACQUELYN 04/17/2011, 7:02 AM   Levy Pupa, MD, PhD 04/17/2011, 10:06 AM [place] [facility] [phone] or if no answer [phone]

## 2011-04-17 NOTE — Progress Notes (Signed)
ANTICOAGULATION CONSULT NOTE - Follow Up Consult  Pharmacy Consult for heparin Indication: chest pain/ACS and NSTEMI  Vital Signs: Temp: 98.5 F (36.9 C) (02/21 1600) Temp src: Oral (02/21 1600) BP: 103/82 mmHg (02/21 2006) Pulse Rate: 77  (02/21 2006)  Labs:  Basename 04/17/11 2030 04/17/11 1130 04/17/11 0500 04/16/11 0500 04/15/11 1000 04/15/11 0400 04/15/11 0335 04/14/11 2136  HGB -- -- 9.9* 10.0* -- -- -- --  HCT -- -- 28.8* 30.3* -- 34.9* -- --  PLT -- -- 323 278 -- 295 -- --  APTT -- -- -- -- -- -- -- --  LABPROT -- -- -- -- -- -- -- --  INR -- -- -- -- -- -- -- --  HEPARINUNFRC 0.39 0.26* 0.35 -- -- -- -- --  CREATININE -- -- 0.67 0.81 -- 0.94 -- --  CKTOTAL -- -- -- -- 1417* -- 1988* 1466*  CKMB -- -- -- -- 32.4* -- 14.6* 4.7*  TROPONINI -- -- -- -- 22.75* -- 6.00* 2.15*   Estimated Creatinine Clearance: 78.5 ml/min (by C-G formula based on Cr of 0.67).   Medications:  Infusions:     . sodium chloride 20 mL/hr (04/15/11 0930)  . feeding supplement (GLUCERNA 1.2 CAL) 1,000 mL (04/17/11 1604)  . heparin 1,600 Units/hr (04/17/11 1722)  . propofol 5 mcg/kg/min (04/17/11 1432)    Assessment: [name]. Heparin level is therapeutic on 1600 units/hr.  No bleeding or complications notes per chart notes.  Goal of Therapy:  Heparin level 0.3-0.7 units/ml   Plan:  1. Continue IV heparin at current rate. 2.  F/U AM heparin level and CBC.  Gardner Candle 04/17/2011,9:11 PM

## 2011-04-17 NOTE — Progress Notes (Signed)
ANTICOAGULATION CONSULT NOTE - Follow Up Consult  Pharmacy Consult for heparin Indication: chest pain/ACS and NSTEMI  No Known Allergies  Patient Measurements: Height: 5\' 9"  (175.3 cm) Weight: 142 lb 6.7 oz (64.6 kg) IBW/kg (Calculated) : 70.7   Vital Signs: Temp: 98.6 F (37 C) (02/21 0434) Temp src: Oral (02/21 0434) BP: 105/60 mmHg (02/21 0700) Pulse Rate: 84  (02/21 0700)  Labs:  Basename 04/17/11 0500 04/16/11 2230 04/16/11 1317 04/16/11 0500 04/15/11 1000 04/15/11 0400 04/15/11 0335 04/14/11 2136  HGB 9.9* -- -- 10.0* -- -- -- --  HCT 28.8* -- -- 30.3* -- 34.9* -- --  PLT 323 -- -- 278 -- 295 -- --  APTT -- -- -- -- -- -- -- --  LABPROT -- -- -- -- -- -- -- --  INR -- -- -- -- -- -- -- --  HEPARINUNFRC 0.35 0.29* 0.20* -- -- -- -- --  CREATININE -- -- -- 0.81 -- 0.94 -- 0.86  CKTOTAL -- -- -- -- 1417* -- 1988* 1466*  CKMB -- -- -- -- 32.4* -- 14.6* 4.7*  TROPONINI -- -- -- -- 22.75* -- 6.00* 2.15*   Estimated Creatinine Clearance: 77.5 ml/min (by C-G formula based on Cr of 0.81).  Medications:  Infusions:    . sodium chloride 20 mL/hr (04/15/11 0930)  . heparin 1,450 Units/hr (04/17/11 0131)  . propofol 15 mcg/kg/min (04/17/11 0125)  . DISCONTD: feeding supplement (GLUCERNA 1.2 CAL) 1,000 mL (04/15/11 1253)  . DISCONTD: fentaNYL infusion INTRAVENOUS Stopped (04/16/11 0900)  . DISCONTD: midazolam (VERSED) infusion Stopped (04/15/11 0617)    Assessment: [name]. Heparin level this AM is at goal at 0.35. No bleeding noted and CBC is stable. Unclear plan at this point for duration of heparin therapy or further intervention. Cardiology following.  Goal of Therapy:  Heparin level 0.3-0.7 units/ml   Plan:  1. Continue heparin gtt at 1450 units/hr 2. Check a heparin level at 1130 today to confirm it remains therapeutic 3. F/u plan for heparin  Daniel Tyler, Daniel Tyler 04/17/2011,7:24 AM

## 2011-04-17 NOTE — Progress Notes (Signed)
Nutrition Follow-up  Patient was extubated yesterday morning; TF was stopped.  Patient had to be re-intubated yesterday evening.  Received consult for TF initiation and management.  Previous TF:  Glucerna 1.2 at 60 ml/h (1728 kcals, 86 grams protein, 1159 ml free water daily).  Diet Order:  NPO  Meds: Scheduled Meds:   . aspirin  162 mg Per Tube Daily  . furosemide      . heparin  1,000 Units Intravenous Once  . insulin aspart  0-15 Units Subcutaneous Q4H  . metoprolol tartrate  25 mg Per Tube Q12H  . pantoprazole sodium  40 mg Per Tube Q24H  . piperacillin-tazobactam (ZOSYN)  IV  3.375 g Intravenous Q8H  . simvastatin  40 mg Per Tube q1800  . vancomycin  1,000 mg Intravenous Q12H  . DISCONTD: antiseptic oral rinse  15 mL Mouth Rinse QID  . DISCONTD: chlorhexidine  15 mL Mouth Rinse BID  . DISCONTD: ciprofloxacin  400 mg Intravenous Q12H   Continuous Infusions:   . sodium chloride 20 mL/hr (04/15/11 0930)  . heparin 1,450 Units/hr (04/17/11 0131)  . propofol 15 mcg/kg/min (04/17/11 0125)  . DISCONTD: feeding supplement (GLUCERNA 1.2 CAL) 1,000 mL (04/15/11 1253)  . DISCONTD: fentaNYL infusion INTRAVENOUS Stopped (04/16/11 0900)  . DISCONTD: midazolam (VERSED) infusion Stopped (04/15/11 0617)   PRN Meds:.acetaminophen, albuterol  Labs:  CMP     Component Value Date/Time   NA 136 04/17/2011 0500   K 3.0* 04/17/2011 0500   CL 98 04/17/2011 0500   CO2 26 04/17/2011 0500   GLUCOSE 140* 04/17/2011 0500   BUN 21 04/17/2011 0500   CREATININE 0.67 04/17/2011 0500   CALCIUM 8.4 04/17/2011 0500   PROT 7.0 04/15/2011 0400   ALBUMIN 2.8* 04/15/2011 0400   AST 50* 04/15/2011 0400   ALT 15 04/15/2011 0400   ALKPHOS 39 04/15/2011 0400   BILITOT 0.4 04/15/2011 0400   GFRNONAA >90 04/17/2011 0500   GFRAA >90 04/17/2011 0500   CBG (last 3)   Basename 04/17/11 0755 04/17/11 0421 04/17/11 0008  GLUCAP 106* 202* 296*     Intake/Output Summary (Last 24 hours) at 04/17/11 0956 Last data filed  at 04/17/11 0900  Gross per 24 hour  Intake   1934 ml  Output   2390 ml  Net   -456 ml    Weight Status:  64.6 kg (stable)  BMI=21  Re-estimated needs:  1765 kcals, 80-95 grams protein, 1.8-2 liters fluid daily  Propofol is currently off.  Nutrition Dx:  Inadequate oral intake, ongoing.  Goal:  TF to meet 90-100% of estimated needs, unmet at this time.  Intervention:  Initiate TF via OG tube with Glucerna 1.2 at 20 ml/h, increase by 10 ml every 4 hours to goal of 60 ml/h to provide 1728 kcals, 86 grams protein, 1159 ml free water daily.  Monitor:  TF tolerance, labs, weight trend.   Hettie Holstein Pager #:  [phone]

## 2011-04-17 NOTE — Progress Notes (Signed)
ANTICOAGULATION CONSULT NOTE - Follow Up Consult  Pharmacy Consult for heparin Indication: chest pain/ACS and NSTEMI  Vital Signs: Temp: 98.9 F (37.2 C) (02/21 1150) Temp src: Oral (02/21 1150) BP: 111/63 mmHg (02/21 1200) Pulse Rate: 76  (02/21 1200)  Labs:  Basename 04/17/11 1130 04/17/11 0500 04/16/11 2230 04/16/11 0500 04/15/11 1000 04/15/11 0400 04/15/11 0335 04/14/11 2136  HGB -- 9.9* -- 10.0* -- -- -- --  HCT -- 28.8* -- 30.3* -- 34.9* -- --  PLT -- 323 -- 278 -- 295 -- --  APTT -- -- -- -- -- -- -- --  LABPROT -- -- -- -- -- -- -- --  INR -- -- -- -- -- -- -- --  HEPARINUNFRC 0.26* 0.35 0.29* -- -- -- -- --  CREATININE -- 0.67 -- 0.81 -- 0.94 -- --  CKTOTAL -- -- -- -- 1417* -- 1988* 1466*  CKMB -- -- -- -- 32.4* -- 14.6* 4.7*  TROPONINI -- -- -- -- 22.75* -- 6.00* 2.15*   Estimated Creatinine Clearance: 78.5 ml/min (by C-G formula based on Cr of 0.67).   Medications:  Infusions:    . sodium chloride 20 mL/hr (04/15/11 0930)  . feeding supplement (GLUCERNA 1.2 CAL)    . heparin 1,450 Units/hr (04/17/11 0131)  . propofol 15 mcg/kg/min (04/17/11 0125)  . DISCONTD: feeding supplement (GLUCERNA 1.2 CAL) 1,000 mL (04/15/11 1253)  . DISCONTD: fentaNYL infusion INTRAVENOUS Stopped (04/16/11 0900)  . DISCONTD: midazolam (VERSED) infusion Stopped (04/15/11 0617)    Assessment: [name]. Heparin level had been therapeutic this AM but now slightly subtherapeutic again. CBC is stable without evidence of bleeding.   Goal of Therapy:  Heparin level 0.3-0.7 units/ml   Plan:  1. Increase heparin to 1600 units/hr 2. Check an 8 hour heparin level 3. F/u cardiology plan for heparin  4. Consider adding an ACEI when BP is stable  Daniel Tyler, Daniel Tyler 04/17/2011,12:14 PM

## 2011-04-17 NOTE — Progress Notes (Signed)
Subjective:  Extubated yesterday, but reintubated with resp distress and remains on vent now. Is alert tonight and wants to get tube out again.  Not SOB, no chest pain. Friend at bedside and says no real family, reports significant noncompliance at home.   Objective:  Vital Signs in the last 24 hours: BP 116/60  Pulse 75  Temp(Src) 98.5 F (36.9 C) (Oral)  Resp 16  Ht 5\' 9"  (1.753 m)  Wt 64.6 kg (142 lb 6.7 oz)  BMI 21.03 kg/m2  SpO2 99%  Physical Exam: Elderly WM on vent alert and motions with hands.  Lungs:  Decreased BS bilat with rales Cardiac:  Regular rhythm, normal S1 and S2, no S3, 1-2 6 systolic murmur Abdomen: negative Extremities:  No edema present  Intake/Output from previous day: 02/20 0701 - 02/21 0700 In: 2098.5 [P.O.:540; I.V.:888.5; NG/GT:120; IV Piggyback:550] Out: 2515 [Urine:2515] Weight change: -2.1 kg (-4 lb 10.1 oz)  Lab Results: Basic Metabolic Panel:  Basename 04/17/11 0500 04/16/11 0500  NA 136 133*  K 3.0* 4.2  CL 98 100  CO2 26 23  GLUCOSE 140* 296*  BUN 21 29*  CREATININE 0.67 0.81   CBC:  Basename 04/17/11 0500 04/16/11 0500  WBC 13.6* 9.8  NEUTROABS -- --  HGB 9.9* 10.0*  HCT 28.8* 30.3*  MCV 85.5 88.1  PLT 323 278   Cardiac Enzymes:  Basename 04/15/11 1000 04/15/11 0335 04/14/11 2136  CKTOTAL 1417* 1988* 1466*  CKMB 32.4* 14.6* 4.7*  CKMBINDEX -- -- --  TROPONINI 22.75* 6.00* 2.15*   BNP    Component Value Date/Time   PROBNP 1087.0* 04/16/2011 1031    Telemetry: Sinus rhythm No sig arrhythmias    Assessment/Plan:  1. Respiatory distress and pneumonia 2. NonSTEMI with decreased LV and mild CHF  REC:  Continue heparin.  Begin ACE when stable. May diuerse some if BP will allow   W. Ashley Royalty.  MD [facility] 04/17/2011, 5:11 PM

## 2011-04-18 ENCOUNTER — Inpatient Hospital Stay (HOSPITAL_COMMUNITY): Payer: Medicare Other

## 2011-04-18 LAB — POCT I-STAT 3, ART BLOOD GAS (G3+)
Acid-Base Excess: 3 mmol/L — ABNORMAL HIGH (ref 0.0–2.0)
Bicarbonate: 25.3 mEq/L — ABNORMAL HIGH (ref 20.0–24.0)
Patient temperature: 99.7
TCO2: 26 mmol/L (ref 0–100)
pH, Arterial: 7.503 — ABNORMAL HIGH (ref 7.350–7.450)

## 2011-04-18 LAB — BASIC METABOLIC PANEL
BUN: 23 mg/dL (ref 6–23)
Chloride: 97 mEq/L (ref 96–112)
GFR calc Af Amer: >90 mL/min (ref >90)
GFR calc non Af Amer: >90 mL/min (ref >90)
Potassium: 3.7 mEq/L (ref 3.5–5.1)

## 2011-04-18 LAB — HEPARIN LEVEL (UNFRACTIONATED): Heparin Unfractionated: 0.35 IU/mL (ref 0.30–0.70)

## 2011-04-18 LAB — GLUCOSE, CAPILLARY
Glucose-Capillary: 262 mg/dL — ABNORMAL HIGH (ref 70–99)
Glucose-Capillary: 218 mg/dL — ABNORMAL HIGH (ref 70–99)
Glucose-Capillary: 171 mg/dL — ABNORMAL HIGH (ref 70–99)
Glucose-Capillary: 326 mg/dL — ABNORMAL HIGH (ref 70–99)

## 2011-04-18 LAB — CBC
MCHC: 34.4 g/dL (ref 30.0–36.0)
Platelets: 314 10*3/uL (ref 150–400)
RDW: 14.1 % (ref 11.5–15.5)
WBC: 9.9 10*3/uL (ref 4.0–10.5)

## 2011-04-18 MED ORDER — PHENOL 1.4 % MT LIQD
1.0000 | OROMUCOSAL | Status: DC | PRN
  Administered 2011-04-20 – 2011-04-23 (×2): 1 via OROMUCOSAL
  Filled 2011-04-18 (×3): qty 177

## 2011-04-18 MED ORDER — SODIUM CHLORIDE 0.9 % IJ SOLN
INTRAMUSCULAR | Status: AC
  Administered 2011-04-18: 10 mL via INTRAVENOUS
  Filled 2011-04-18: qty 10

## 2011-04-18 MED ORDER — FUROSEMIDE 10 MG/ML IJ SOLN
40.0000 mg | Freq: Once | INTRAMUSCULAR | Status: AC
  Administered 2011-04-18: 40 mg via INTRAVENOUS

## 2011-04-18 NOTE — Progress Notes (Signed)
ANTICOAGULATION/ANTIBIOTIC CONSULT NOTE - Follow Up Consult  Pharmacy Consult for heparin and zosyn Indication: NSTEMI and aspiration PNA  Vital Signs: Temp: 99.7 F (37.6 C) (02/22 0843) Temp src: Oral (02/22 0843) BP: 129/56 mmHg (02/22 0911) Pulse Rate: 81  (02/22 0911)  Labs:  Basename 04/18/11 0505 04/17/11 2030 04/17/11 1130 04/17/11 0500 04/16/11 0500  HGB 11.4* -- -- 9.9* --  HCT 33.1* -- -- 28.8* 30.3*  PLT 314 -- -- 323 278  APTT -- -- -- -- --  LABPROT -- -- -- -- --  INR -- -- -- -- --  HEPARINUNFRC 0.35 0.39 0.26* -- --  CREATININE 0.68 -- -- 0.67 0.81  CKTOTAL -- -- -- -- --  CKMB -- -- -- -- --  TROPONINI -- -- -- -- --   Estimated Creatinine Clearance: 80.8 ml/min (by C-G formula based on Cr of 0.68).   Assessment: Anticoagulation: Heparin gtt for NSTEMI:  HL 0.35-therapeutic on gtt at 1600 units/hr. No bleeding noted. CBC stable.  Infectious Disease: Zosyn D#5 for aspiration PNA. Tmax 99.7, WBC back to wnl. Creat remains stable. UOP 1.2 ml/kg/hr.  Vanc 2/18>>2/21 Zosyn 2/18>> Cipro 2/19>>2/20  2/18 Sputum - pend 2/18 MRSA PCR - negative 2/18 Urine - NG 2/18 Blood - NGTD  Goal of Therapy:  Heparin level 0.3-0.7 units/ml; Eradication of infection   Plan:  1. Continue heparin at 1600units/hr. F/u a.m. Level. 2. Continue Zosyn 3.375 gm IV q8h. F/u LOT.  Lavonia Dana, PharmD, BCPS Clinical pharmacist, pager [phone] 04/18/2011,10:35 AM

## 2011-04-18 NOTE — Progress Notes (Signed)
eLink Physician-Brief Progress Note Patient Name: Daniel Tyler DOB: 10/11/1940 MRN: [id number]  Date of Service  04/18/2011   HPI/Events of Note   Crackles per RN  eICU Interventions  Lasix 40 IV   Intervention Category Major Interventions: Hypoxemia - evaluation and management  Lynnda Wiersma V. 04/18/2011, 1:41 AM

## 2011-04-18 NOTE — Progress Notes (Signed)
Patient alert and following commands. Patient extubated around 11 am. No signs of stridor or respiratory distress. Will continue to monitor

## 2011-04-18 NOTE — Procedures (Signed)
Extubation Procedure Note  Patient Details:   Name: Daniel Tyler DOB: [birth year] MRN: [id number]   Airway Documentation:  Airway 8 mm (Active)  Secured at (cm) 24 cm 04/16/2011  8:45 AM  Measured From Lips 04/16/2011  8:45 AM  Secured Location Center 04/16/2011  8:45 AM  Secured By [employer] 04/16/2011  8:45 AM  Tube Holder Repositioned Yes 04/16/2011  8:45 AM  Cuff Pressure (cm H2O) 22 cm H2O 04/16/2011  8:45 AM  Site Condition Dry 04/16/2011  8:45 AM     Airway 8 mm (Active)  Secured at (cm) 24 cm 04/18/2011  9:11 AM  Measured From Lips 04/18/2011  9:11 AM  Secured Location Left 04/18/2011  9:11 AM  Secured By [employer] 04/18/2011  9:11 AM  Tube Holder Repositioned Yes 04/18/2011  9:11 AM  Cuff Pressure (cm H2O) 26 cm H2O 04/17/2011  8:06 PM  Site Condition Dry 04/17/2011  8:06 PM    Evaluation  O2 sats: stable throughout Complications: No apparent complications Patient did tolerate procedure well. Suctioning: Airway Yes pt able to speak Extubated to 4L [name] tolerating well spo2 97% BBS crs strong cough able to clear secretions, no stridor at this time will continue to monitor Filed Vitals:   04/18/11 0911  BP: 129/56  Pulse: 81  Temp:   Resp: 15     Natallie Ravenscroft B Ralph Leyden 04/18/2011, 11:07 AM

## 2011-04-18 NOTE — Progress Notes (Signed)
Daniel Tyler is a 71 y.o. male admitted on 04/14/2011 with cough and dyspnea, and fall.  He had emesis, and fever in ED with low BP.  He was initially DNR/DNI.  He developed worsening respiratory distress and found to have elevated cardiac enzymes, and later reversed code status.  He was intubated and transferred to ICU. PMHx DM, Cardiomegaly  Line/tube: 2/19 ETT >> 2/20; 2/20PM  >> 2/19 Rt [place] CVL >> 2/20 OGT >>>  Cultures: Urine 2/18>> negative. Blood 2/18>> NGTD >>  Antibiotics: Vancomycin 2/18 >> 2/21 Zosyn 2/18 >>  Cipro 2/18 >> 2/20  Tests/events: 2/18 CT head>>no acute process 2/18 CT neck>>no neck fracture 2/19 Echo>> EF 30-35%, moderate diffuse hypokinesis, severe hypokinesis of inferior myocardium; mild to mod mitral valve regurg  SUBJECTIVE: Points to throat when asked if having any pain.  Doing well on pressure support.  Still having large amount of thick secretions.     OBJECTIVE:  Blood pressure 110/60, pulse 83, temperature 99.7 F (37.6 C), temperature source Oral, resp. rate 15, height 5\' 9"  (1.753 m), weight 146 lb 9.7 oz (66.5 kg), SpO2 97.00%. Wt Readings from Last 3 Encounters:  04/18/11 146 lb 9.7 oz (66.5 kg)   Body mass index is 21.65 kg/(m^2).  I/O last 3 completed shifts: In: 2420 [I.V.:1265; Other:10; NG/GT:720; IV Piggyback:425] Out: 3745 [Urine:3745]  Vent Mode:  [-] PRVC FiO2 (%):  [29.8 %-40.1 %] 40 % Set Rate:  [15 bmp] 15 bmp Vt Set:  [550 mL] 550 mL PEEP:  [5 cmH20] 5 cmH20 Pressure Support:  [5 cmH20] 5 cmH20 Plateau Pressure:  [9 cmH20-16 cmH20] 9 cmH20  General - no distress, awake HEENT - ETT in place Cardiac - s1s2 regular Chest - basilar rales, diffuse rhonchi Abd - soft, nontender Ext - no edema Neuro - alert, answers questions  CBC    Component Value Date/Time   WBC 9.9 04/18/2011 0505   RBC 3.84* 04/18/2011 0505   HGB 11.4* 04/18/2011 0505   HCT 33.1* 04/18/2011 0505   PLT 314 04/18/2011 0505   MCV 86.2 04/18/2011 0505     MCH 29.7 04/18/2011 0505   MCHC 34.4 04/18/2011 0505   RDW 14.1 04/18/2011 0505   BMET    Component Value Date/Time   NA 134* 04/18/2011 0505   K 3.7 04/18/2011 0505   CL 97 04/18/2011 0505   CO2 26 04/18/2011 0505   GLUCOSE 272* 04/18/2011 0505   BUN 23 04/18/2011 0505   CREATININE 0.68 04/18/2011 0505   CALCIUM 8.8 04/18/2011 0505   GFRNONAA >90 04/18/2011 0505   GFRAA >90 04/18/2011 0505   ABG    Component Value Date/Time   PHART 7.503* 04/18/2011 0438   PCO2ART 32.4* 04/18/2011 0438   PO2ART 56.0* 04/18/2011 0438   HCO3 25.3* 04/18/2011 0438   TCO2 26 04/18/2011 0438   ACIDBASEDEF 1.0 04/17/2011 0059   O2SAT 91.0 04/18/2011 0438    Portable Chest Xray In Am  04/17/2011  *RADIOLOGY REPORT*  Clinical Data: Reintubated.  PORTABLE CHEST - 1 VIEW  Comparison: 04/16/2011.  Findings: Basilar air space disease greater on the right.  Findings suspicious for infectious infiltrate.  Asymmetric pulmonary edema is a secondary less likely consideration.  Endotracheal tube tip 6 cm above the carina.  Right central line tip mid superior vena cava level.  No gross pneumothorax. Nasogastric tube courses below the diaphragm.  The tip is not included on this exam.  Heart size within normal limits.  Calcified aorta.  IMPRESSION: Basilar air  space disease greater on the right.  Findings suspicious for infectious infiltrate. Appearance without significant change.  Original Report Authenticated By: Fuller Canada, [name]   Dg Chest Port 1 View  04/16/2011  *RADIOLOGY REPORT*  Clinical Data: Intubated.  Shortness of breath.  PORTABLE CHEST - 1 VIEW  Comparison: Earlier today.  Findings: Interval endotracheal tube in satisfactory position. Stable right subclavian catheter and nasogastric tube.  Normal sized heart.  Significant increase in patchy airspace opacity in the right mid and lower lung zones.  Increased airspace opacity in the left lower lung zone.  The overall lung volumes remain hyperexpanded.  Interval probable  small right pleural effusion.  IMPRESSION:  1.  Endotracheal tube in satisfactory position. 2.  Worsening bilateral pneumonia.  Original Report Authenticated By: Darrol Angel, [name]    ASSESSMENT/PLAN:  1. Acute respiratory failure: 2nd to RLL pneumonia -was tolerating PS well with mental status improved early 2/20 --> extubated 2/20 and reintubated 2/20 PM for lethargy, inability to maintain airway -f/u CXR -albuterol PRN, continue abx (Zosyn) -doing well with PS, could re-try extubation today with low threshold for bipap overnight   Lab 04/18/11 0438 04/17/11 0059 04/15/11 0330 04/15/11 0051 04/14/11 2158  PHART 7.503* 7.409 7.330* 7.387 7.416  PCO2ART 32.4* 36.9 35.4 32.1* 27.5*  PO2ART 56.0* 75.0* 176.0* 62.7* 80.0    2. Elevated troponins: NSTEMI due to demand ischemia -cardiology following, appreciate input  -f/u Echo --> EF 30-35%, diffuse mild hypokinesis with severe hypokinesis in inferior myocardium  -continue lopressor, ASA, zocor -heparin gtt per cardiology -will add ACE-i per cards once patient is able to tolerate  Lab 04/15/11 1000 04/15/11 0335 04/14/11 2136 04/14/11 1810  TROPONINI 22.75* 6.00* 2.15* 1.74*    3. Pneumonia with concern for aspiration -D5/x zosyn; d/c'ed vanco on 2/21 -D/c'ed cipro 2/20, unlikely to be pseudomonas so no need for double coverage -WBC improved  Lab 04/18/11 0505 04/17/11 0500 04/16/11 0500 04/15/11 0400 04/14/11 2136  WBC 9.9 13.6* 9.8 10.8* 9.0    4. DM -SSI  Lab 04/18/11 0415 04/18/11 0019 04/17/11 2017 04/17/11 1555 04/17/11 1127  GLUCAP 262* 210* 182* 167* 83    5. Protein calorie malnutrition -Tube feeds started 2/21 -transition to PO once extubated   6. Sedation -sedation protocol while on vent -daily WUA   Best practice -Protonix for SUP -Hep gtt for SVT ppx -No family available for update (patient doesn't have family)  MCGILL,JACQUELYN 04/18/2011, 7:46 AM   Levy Pupa, MD, PhD 04/18/2011, 10:26  AM [facility] [phone] or if no answer [phone]

## 2011-04-18 NOTE — Progress Notes (Signed)
Subjective:  Patient extubated and is stable on O2 now.  Denies chest pain or SOB.  Says active prior to admit.  No real ongoing medical care.  Uses Urgent Care but has not been real compliant and sees them as needed.  Objective:  Vital Signs in the last 24 hours: BP 108/54  Pulse 68  Temp(Src) 98.2 F (36.8 C) (Oral)  Resp 19  Ht 5\' 9"  (1.753 m)  Wt 66.5 kg (146 lb 9.7 oz)  BMI 21.65 kg/m2  SpO2 97%  Physical Exam: Elderly WM alert and in NAD Lungs:  Decreased BS bilat with rales and wheezing Cardiac:  Regular rhythm, normal S1 and S2, no S3, 1-2 6 systolic murmur Abdomen: negative Extremities:  No edema present  Intake/Output from previous day: 02/21 0701 - 02/22 0700 In: 1814.5 [I.V.:844.5; NG/GT:810; IV Piggyback:150] Out: 1840 [Urine:1840] Weight change: 1.9 kg (4 lb 3 oz)  Lab Results: Basic Metabolic Panel:  Basename 04/18/11 0505 04/17/11 0500  NA 134* 136  K 3.7 3.0*  CL 97 98  CO2 26 26  GLUCOSE 272* 140*  BUN 23 21  CREATININE 0.68 0.67   CBC:  Basename 04/18/11 0505 04/17/11 0500  WBC 9.9 13.6*  NEUTROABS -- --  HGB 11.4* 9.9*  HCT 33.1* 28.8*  MCV 86.2 85.5  PLT 314 323   BNP    Component Value Date/Time   PROBNP 1087.0* 04/16/2011 1031    Telemetry: Sinus rhythm No sig arrhythmias    Assessment/Plan:  1.Bilateral pneumonia and sepsis 2. NonSTEMI with decreased LV and mild CHF  REC:  Continue heparin.  Begin ACE when stable. DId not broach further cardiac w/u today, but when progresses from pneumonia may explore.  He has no family.  Darden Palmer.  MD [facility] 04/18/2011, 1:11 PM

## 2011-04-19 ENCOUNTER — Inpatient Hospital Stay (HOSPITAL_COMMUNITY): Payer: Medicare Other

## 2011-04-19 LAB — CBC
Hemoglobin: 9.5 g/dL — ABNORMAL LOW (ref 13.0–17.0)
Hemoglobin: 8.5 g/dL — ABNORMAL LOW (ref 13.0–17.0)
MCH: 29.7 pg (ref 26.0–34.0)
MCH: 29.2 pg (ref 26.0–34.0)
MCHC: 33.3 g/dL (ref 30.0–36.0)
Platelets: 406 10*3/uL — ABNORMAL HIGH (ref 150–400)
Platelets: 354 10*3/uL (ref 150–400)
RBC: 3.2 MIL/uL — ABNORMAL LOW (ref 4.22–5.81)

## 2011-04-19 LAB — BASIC METABOLIC PANEL
BUN: 10 mg/dL (ref 6–23)
CO2: 26 mEq/L (ref 19–32)
Calcium: 8.6 mg/dL (ref 8.4–10.5)
Calcium: 6.8 mg/dL — ABNORMAL LOW (ref 8.4–10.5)
GFR calc Af Amer: >90 mL/min (ref >90)
GFR calc non Af Amer: >90 mL/min (ref >90)
GFR calc non Af Amer: >90 mL/min (ref >90)
Glucose, Bld: 205 mg/dL — ABNORMAL HIGH (ref 70–99)
Potassium: 3.1 mEq/L — ABNORMAL LOW (ref 3.5–5.1)
Potassium: 2.5 mEq/L — CL (ref 3.5–5.1)
Sodium: 135 mEq/L (ref 135–145)
Sodium: 137 mEq/L (ref 135–145)

## 2011-04-19 LAB — GLUCOSE, CAPILLARY
Glucose-Capillary: 124 mg/dL — ABNORMAL HIGH (ref 70–99)
Glucose-Capillary: 155 mg/dL — ABNORMAL HIGH (ref 70–99)
Glucose-Capillary: 170 mg/dL — ABNORMAL HIGH (ref 70–99)
Glucose-Capillary: 380 mg/dL — ABNORMAL HIGH (ref 70–99)

## 2011-04-19 LAB — HEPARIN LEVEL (UNFRACTIONATED): Heparin Unfractionated: 0.26 IU/mL — ABNORMAL LOW (ref 0.30–0.70)

## 2011-04-19 LAB — POCT I-STAT 3, ART BLOOD GAS (G3+)
Acid-Base Excess: 2 mmol/L (ref 0.0–2.0)
O2 Saturation: 91 %
TCO2: 27 mmol/L (ref 0–100)
pCO2 arterial: 35.7 mmHg (ref 35.0–45.0)
pO2, Arterial: 55 mmHg — ABNORMAL LOW (ref 80.0–100.0)

## 2011-04-19 MED ORDER — GUAIFENESIN ER 600 MG PO TB12
600.0000 mg | ORAL_TABLET | Freq: Two times a day (BID) | ORAL | Status: DC
  Administered 2011-04-20 – 2011-04-21 (×4): 600 mg via ORAL
  Filled 2011-04-19 (×5): qty 1

## 2011-04-19 MED ORDER — POTASSIUM CHLORIDE CRYS ER 20 MEQ PO TBCR
40.0000 meq | EXTENDED_RELEASE_TABLET | Freq: Once | ORAL | Status: AC
  Administered 2011-04-19: 40 meq via ORAL
  Filled 2011-04-19: qty 2

## 2011-04-19 MED ORDER — ACETAMINOPHEN 325 MG PO TABS
975.0000 mg | ORAL_TABLET | Freq: Once | ORAL | Status: AC
  Administered 2011-04-19: 975 mg via ORAL
  Filled 2011-04-19: qty 3

## 2011-04-19 MED ORDER — POTASSIUM CHLORIDE CRYS ER 20 MEQ PO TBCR
40.0000 meq | EXTENDED_RELEASE_TABLET | Freq: Once | ORAL | Status: AC
  Administered 2011-04-19: 40 meq via ORAL

## 2011-04-19 MED ORDER — POTASSIUM CHLORIDE CRYS ER 20 MEQ PO TBCR
EXTENDED_RELEASE_TABLET | ORAL | Status: AC
  Filled 2011-04-19: qty 2

## 2011-04-19 MED ORDER — ENALAPRIL MALEATE 5 MG PO TABS
5.0000 mg | ORAL_TABLET | Freq: Every day | ORAL | Status: DC
  Administered 2011-04-19 – 2011-04-20 (×2): 5 mg via ORAL
  Filled 2011-04-19 (×3): qty 1

## 2011-04-19 MED ORDER — LEVOFLOXACIN 500 MG PO TABS
500.0000 mg | ORAL_TABLET | Freq: Every day | ORAL | Status: DC
  Administered 2011-04-19 – 2011-04-20 (×2): 500 mg via ORAL
  Filled 2011-04-19 (×2): qty 1

## 2011-04-19 NOTE — Progress Notes (Signed)
New order was for CVC to be discontinued today but no peripheral site was observed.  Pt is on heparin through a right wrist IV that has no redness or soreness so site is not being changed.  CVC is being used to draw heparin levels so CVC left in and ports flushed.

## 2011-04-19 NOTE — Progress Notes (Signed)
Daniel Tyler is a 71 y.o. male admitted on 04/14/2011 with cough and dyspnea, and fall.  He had emesis, and fever in ED with low BP.  He was initially DNR/DNI.  He developed worsening respiratory distress and found to have elevated cardiac enzymes, and later reversed code status.  He was intubated and transferred to ICU. PMHx DM, Cardiomegaly  Line/tube: 2/19 ETT >> 2/20; 2/20PM  >> 2/22 2/19 Rt [place] CVL >> 2/20 OGT >>> 2/22 [place]>>>2/23  Cultures: Urine 2/18>> negative. Blood 2/18>> NGTD >>  Antibiotics: Vancomycin 2/18 >> 2/21 Zosyn 2/18 (HCAP+/- aspiration)>> 2/23 Cipro 2/18 >> 2/20 levaquin 2/23 >> (planned stop 2/25)   Tests/events: 2/18 CT head>>no acute process 2/18 CT neck>>no neck fracture 2/19 Echo>> EF 30-35%, moderate diffuse hypokinesis, severe hypokinesis of inferior myocardium; mild to mod mitral valve regurg  SUBJECTIVE: Extubated 2/22. Comfortable on [place]  OBJECTIVE:  Blood pressure 139/66, pulse 77, temperature 98.3 F (36.8 C), temperature source Oral, resp. rate 18, height 5\' 9"  (1.753 m), weight 146 lb 2.6 oz (66.3 kg), SpO2 97.00%. Wt Readings from Last 3 Encounters:  04/19/11 146 lb 2.6 oz (66.3 kg)   Body mass index is 21.58 kg/(m^2).  I/O last 3 completed shifts: In: 2610 [P.O.:480; I.V.:1215; Other:10; NG/GT:730; IV Piggyback:175] Out: 2745 [Urine:2745]  Vent Mode:  [-] CPAP;PSV FiO2 (%):  [39.8 %-40.3 %] 39.8 % Vt Set:  [550 mL] 550 mL PEEP:  [5 cmH20] 5 cmH20 Pressure Support:  [5 cmH20] 5 cmH20  General - no distress, awake Cardiac - s1s2 regular Chest - decreased breath sounds RLL with coarse breath sounds, NI WOB, fair aeration Abd - soft, nontender Ext - no edema Neuro - alert, appropriate to questions  CBC    Component Value Date/Time   WBC 15.1* 04/19/2011 0546   RBC 3.20* 04/19/2011 0546   HGB 9.5* 04/19/2011 0546   HCT 27.6* 04/19/2011 0546   PLT 406* 04/19/2011 0546   MCV 86.3 04/19/2011 0546   MCH 29.7 04/19/2011 0546   MCHC  34.4 04/19/2011 0546   RDW 13.8 04/19/2011 0546   BMET    Component Value Date/Time   NA 135 04/19/2011 0546   K 3.1* 04/19/2011 0546   CL 99 04/19/2011 0546   CO2 26 04/19/2011 0546   GLUCOSE 147* 04/19/2011 0546   BUN 13 04/19/2011 0546   CREATININE 0.58 04/19/2011 0546   CALCIUM 8.6 04/19/2011 0546   GFRNONAA >90 04/19/2011 0546   GFRAA >90 04/19/2011 0546   ABG    Component Value Date/Time   PHART 7.466* 04/19/2011 0520   PCO2ART 35.7 04/19/2011 0520   PO2ART 55.0* 04/19/2011 0520   HCO3 25.8* 04/19/2011 0520   TCO2 27 04/19/2011 0520   ACIDBASEDEF 1.0 04/17/2011 0059   O2SAT 91.0 04/19/2011 0520    Dg Chest Port 1 View  04/19/2011  *RADIOLOGY REPORT*  Clinical Data: Extubation.  PORTABLE CHEST - 1 VIEW  Comparison: 04/18/2011  Findings: Endotracheal tube has been removed as has the NG tube. Right central line is unchanged.  The There is hyperinflation of the lungs compatible with COPD.  Bilateral lower lobe opacities, right greater than left again noted, not significantly changed given differences in technique.  Right lower lobe opacities suspicious for pneumonia.  IMPRESSION: Interval extubation.  Otherwise no significant change.  Original Report Authenticated By: Cyndie Chime, [name]   Dg Chest Port 1 View  04/18/2011  *RADIOLOGY REPORT*  Clinical Data: Evaluate endotracheal tube position  PORTABLE CHEST - 1 VIEW  Comparison: Portable chest x-ray of 04/17/2011  Findings: The tip of the endotracheal tube is approximally 5.7 cm above the carina.  A right IJ central venous line remains with the tip in the mid lower SVC.  No pneumothorax is seen. Opacities remain at the lung bases right greater than left most consistent with pneumonia.  Heart size is stable.  IMPRESSION:  1.  Bibasilar opacities right greater than right suspicious for pneumonia. 2.  Endotracheal tip 5.2 cm above the carina.  Original Report Authenticated By: Juline Patch, [name]    ASSESSMENT/PLAN:  1. Acute respiratory failure:  2nd to RLL pneumonia Extubated 2/20 and reintubated 2/20 PM for lethargy, inability to maintain airway Extubated 02/22. -Doing well. Small elevation in WBC but afebrile and respiratory status stable.  -albuterol PRN, continue abx (Zosyn) x 7 days   Lab 04/19/11 0520 04/18/11 0438 04/17/11 0059 04/15/11 0330 04/15/11 0051  PHART 7.466* 7.503* 7.409 7.330* 7.387  PCO2ART 35.7 32.4* 36.9 35.4 32.1*  PO2ART 55.0* 56.0* 75.0* 176.0* 62.7*    2. Elevated troponins: NSTEMI due to demand ischemia Echo --> EF 30-35%, diffuse mild hypokinesis with severe hypokinesis in inferior myocardium  -cardiology following, appreciate input  -continue lopressor 25 bid, ASA, zocor. Will add ACE-i 2/23 -heparin gtt per cardiology, ? duration  Lab 04/15/11 1000 04/15/11 0335 04/14/11 2136 04/14/11 1810  TROPONINI 22.75* 6.00* 2.15* 1.74*    3. Pneumonia with concern for aspiration -D6/x zosyn; d/c'ed vanco on 2/21 -D/c'ed cipro 2/20, unlikely to be pseudomonas so no need for double coverage -transition to levaquin 2/23 to complete 8 days total abx  Lab 04/19/11 0546 04/18/11 0505 04/17/11 0500 04/16/11 0500 04/15/11 0400  WBC 15.1* 9.9 13.6* 9.8 10.8*    4. DM -SSI -Home medications: glipizide 10 bid, Lantus 15 qhs, metformin 1000 bid >> restart once taking good PO  Lab 04/19/11 0742 04/19/11 0422 04/19/11 0039 04/18/11 2042 04/18/11 1619  GLUCAP 183* 124* 170* 326* 171*    5. Protein calorie malnutrition -Carbohydrate modified   Best practice -Protonix for SUP -Hep gtt for SVT ppx -No family available for update (patient doesn't have family) -Disposition: will transfer to floor bed with telemetry and back to FMTS. Orders in for PT/OT.   OH PARK, ANGELA 04/19/2011, 8:40 AM  Levy Pupa, MD, PhD 04/19/2011, 9:45 AM [facility] [phone] or if no answer [phone]

## 2011-04-19 NOTE — Progress Notes (Signed)
SUBJECTIVE:  Frustrated that he cannot sleep due to noise.    OBJECTIVE:   Vitals:   Filed Vitals:   04/19/11 0700 04/19/11 0748 04/19/11 0800 04/19/11 0900  BP: 132/58  139/66 121/57  Pulse: 73  77 77  Temp:  98.3 F (36.8 C)    TempSrc:  Oral    Resp: 21  18 15   Height:      Weight:      SpO2: 97%  97% 94%   I&O's:   Intake/Output Summary (Last 24 hours) at 04/19/11 1003 Last data filed at 04/19/11 0800  Gross per 24 hour  Intake   1297 ml  Output   1111 ml  Net    186 ml   TELEMETRY: Reviewed telemetry pt in NSR:     PHYSICAL EXAM General: Well developed, well nourished, in no acute distress Head: Eyes PERRLA, No xanthomas.   Normal cephalic and atramatic  Lungs:   Clear bilaterally to auscultation and percussion. Heart:   HRRR S1 S2 Pulses are 2+ & equal.. Abdomen: Bowel sounds are positive, abdomen soft and non-tender without masses Extremities:   No clubbing, cyanosis or edema.  DP +1 Neuro: Alert and oriented X 3. Psych:  Good affect, responds appropriately   LABS: Basic Metabolic Panel:  Basename 04/19/11 0546 04/18/11 0505  NA 135 134*  K 3.1* 3.7  CL 99 97  CO2 26 26  GLUCOSE 147* 272*  BUN 13 23  CREATININE 0.58 0.68  CALCIUM 8.6 8.8  MG -- --  PHOS -- --   CBC:  Basename 04/19/11 0930 04/19/11 0546  WBC 12.9* 15.1*  NEUTROABS -- --  HGB 8.5* 9.5*  HCT 25.5* 27.6*  MCV 87.6 86.3  PLT 354 406*     RADIOLOGY: Ct Head Wo Contrast  04/14/2011  *RADIOLOGY REPORT*  Clinical Data:  Weakness.  Fall  CT HEAD WITHOUT CONTRAST CT CERVICAL SPINE WITHOUT CONTRAST  Technique:  Multidetector CT imaging of the head and cervical spine was performed following the standard protocol without intravenous contrast.  Multiplanar CT image reconstructions of the cervical spine were also generated.  Comparison:   None  CT HEAD  Findings: Age appropriate atrophy.  Negative for hemorrhage. Negative for acute infarct or mass.  Chronic sinusitis with mucosal thickening  in the paranasal sinuses. Calvarium is intact.  IMPRESSION: No acute intracranial abnormality.  Sinusitis.  CT CERVICAL SPINE  Findings: Negative for fracture.  Cervical disc degeneration and moderate to advanced spondylosis at C3-4, C4-5, and C5-6.  Milder spondylosis at C6-7.  Carotid atherosclerotic calcification is present.  IMPRESSION: Negative for fracture.  Original Report Authenticated By: Camelia Phenes, [name]   Ct Cervical Spine Wo Contrast  04/14/2011  *RADIOLOGY REPORT*  Clinical Data:  Weakness.  Fall  CT HEAD WITHOUT CONTRAST CT CERVICAL SPINE WITHOUT CONTRAST  Technique:  Multidetector CT imaging of the head and cervical spine was performed following the standard protocol without intravenous contrast.  Multiplanar CT image reconstructions of the cervical spine were also generated.  Comparison:   None  CT HEAD  Findings: Age appropriate atrophy.  Negative for hemorrhage. Negative for acute infarct or mass.  Chronic sinusitis with mucosal thickening in the paranasal sinuses. Calvarium is intact.  IMPRESSION: No acute intracranial abnormality.  Sinusitis.  CT CERVICAL SPINE  Findings: Negative for fracture.  Cervical disc degeneration and moderate to advanced spondylosis at C3-4, C4-5, and C5-6.  Milder spondylosis at C6-7.  Carotid atherosclerotic calcification is present.  IMPRESSION: Negative for  fracture.  Original Report Authenticated By: Camelia Phenes, [name]   Dg Chest Port 1 View  04/19/2011  *RADIOLOGY REPORT*  Clinical Data: Extubation.  PORTABLE CHEST - 1 VIEW  Comparison: 04/18/2011  Findings: Endotracheal tube has been removed as has the NG tube. Right central line is unchanged.  The There is hyperinflation of the lungs compatible with COPD.  Bilateral lower lobe opacities, right greater than left again noted, not significantly changed given differences in technique.  Right lower lobe opacities suspicious for pneumonia.  IMPRESSION: Interval extubation.  Otherwise no significant change.   Original Report Authenticated By: Cyndie Chime, [name]   Dg Chest Port 1 View  04/18/2011  *RADIOLOGY REPORT*  Clinical Data: Evaluate endotracheal tube position  PORTABLE CHEST - 1 VIEW  Comparison: Portable chest x-ray of 04/17/2011  Findings: The tip of the endotracheal tube is approximally 5.7 cm above the carina.  A right IJ central venous line remains with the tip in the mid lower SVC.  No pneumothorax is seen. Opacities remain at the lung bases right greater than left most consistent with pneumonia.  Heart size is stable.  IMPRESSION:  1.  Bibasilar opacities right greater than right suspicious for pneumonia. 2.  Endotracheal tip 5.2 cm above the carina.  Original Report Authenticated By: Juline Patch, [name]   Portable Chest Xray In Am  04/17/2011  *RADIOLOGY REPORT*  Clinical Data: Reintubated.  PORTABLE CHEST - 1 VIEW  Comparison: 04/16/2011.  Findings: Basilar air space disease greater on the right.  Findings suspicious for infectious infiltrate.  Asymmetric pulmonary edema is a secondary less likely consideration.  Endotracheal tube tip 6 cm above the carina.  Right central line tip mid superior vena cava level.  No gross pneumothorax. Nasogastric tube courses below the diaphragm.  The tip is not included on this exam.  Heart size within normal limits.  Calcified aorta.  IMPRESSION: Basilar air space disease greater on the right.  Findings suspicious for infectious infiltrate. Appearance without significant change.  Original Report Authenticated By: Fuller Canada, [name]   Dg Chest Port 1 View  04/16/2011  *RADIOLOGY REPORT*  Clinical Data: Intubated.  Shortness of breath.  PORTABLE CHEST - 1 VIEW  Comparison: Earlier today.  Findings: Interval endotracheal tube in satisfactory position. Stable right subclavian catheter and nasogastric tube.  Normal sized heart.  Significant increase in patchy airspace opacity in the right mid and lower lung zones.  Increased airspace opacity in the left lower  lung zone.  The overall lung volumes remain hyperexpanded.  Interval probable small right pleural effusion.  IMPRESSION:  1.  Endotracheal tube in satisfactory position. 2.  Worsening bilateral pneumonia.  Original Report Authenticated By: Darrol Angel, [name]   Dg Chest Port 1 View  04/16/2011  *RADIOLOGY REPORT*  Clinical Data: Follow up pneumonia  PORTABLE CHEST - 1 VIEW  Comparison: 04/15/2011  Findings: Cardiomediastinal silhouette is stable.  Stable endotracheal and NG tube position.  Right subclavian catheter is stable in position.  No convincing pulmonary edema.  Hazy infiltrate/pneumonia again noted bilateral basilar right greater than left.  IMPRESSION: Stable support apparatus.  No significant change.  Persistent infiltrate/pneumonia bilateral basilar right greater than left.  Original Report Authenticated By: Natasha Mead, [name]   Dg Chest Port 1 View  04/15/2011  *RADIOLOGY REPORT*  Clinical Data: Central line placement.  PORTABLE CHEST - 1 VIEW  Comparison: Chest radiograph performed earlier today at 01:09 a.m.  Findings: The patient's right subclavian line is noted  ending about the mid to distal SVC.  The endotracheal tube is seen ending 5-6 cm above the carina.  An enteric tube is noted extending below the diaphragm.  The lungs are well-aerated.  There is worsened right basilar airspace opacification, and slightly worsened mild left basilar airspace opacity, concerning for multifocal pneumonia.  There is no evidence of pleural effusion or pneumothorax.  The heart is normal in size; the mediastinal contour is within normal limits.  No acute osseous abnormalities are seen.  IMPRESSION:  1.  Right subclavian line noted ending about the mid to distal SVC. 2.  Worsening multifocal pneumonia, particularly at the right lung base. 3.  Endotracheal tube ends 5-6 cm above the carina.  Original Report Authenticated By: Tonia Ghent, [name]   Dg Chest Port 1 View  04/15/2011  *RADIOLOGY REPORT*  Clinical  Data: Shortness of breath.  Clinical concern for fluid overload.  PORTABLE CHEST - 1 VIEW  Comparison: 04/14/2011.  Findings: Significant increase in right lower lobe airspace opacity with interval mild left basilar airspace opacity.  Normal sized heart.  No pleural fluid.  Unremarkable bones.  IMPRESSION:  1.  Worsening right lower lobe pneumonia. 2.  Interval probable pneumonia at the left lung base.  Original Report Authenticated By: Darrol Angel, [name]   Dg Chest Port 1 View  04/14/2011  *RADIOLOGY REPORT*  Clinical Data: Right-sided chest pain  PORTABLE CHEST - 1 VIEW  Comparison: None.  Findings: Right lower lobe airspace disease, possible pneumonia.  COPD with hyperinflation.  Apical emphysema.  No pleural effusion. Vascularity is normal.  IMPRESSION: COPD.  Right lower lobe infiltrate, suspicious for pneumonia.  Original Report Authenticated By: Camelia Phenes, [name]      ASSESSMENT:  1.  Bilateral pneumonia and sepsis 2.  NSTEMI 3.  Mild CHF 4.  Hypokalemia  PLAN:   Replete potassium per primary MD Continue ASA/ACE I/beta blocker/statin Continue IV Heparin gtt for NSTEMI   Quintella Reichert, MD  04/19/2011  10:03 AM

## 2011-04-19 NOTE — Progress Notes (Signed)
ANTICOAGULATION CONSULT NOTE - Follow Up Consult  Pharmacy Consult for heparin Indication: NSTEMI  Vital Signs: Temp: 98.3 F (36.8 C) (02/23 0748) Temp src: Oral (02/23 0748) BP: 104/49 mmHg (02/23 1013) Pulse Rate: 82  (02/23 1011)  Labs:  Basename 04/19/11 0930 04/19/11 0546 04/18/11 0505 04/17/11 2030  HGB 8.5* 9.5* -- --  HCT 25.5* 27.6* 33.1* --  PLT 354 406* 314 --  APTT -- -- -- --  LABPROT -- -- -- --  INR -- -- -- --  HEPARINUNFRC -- 0.26* 0.35 0.39  CREATININE 0.45* 0.58 0.68 --  CKTOTAL -- -- -- --  CKMB -- -- -- --  TROPONINI -- -- -- --   Estimated Creatinine Clearance: 80.6 ml/min (by C-G formula based on Cr of 0.45).   Medications:  Infusions:    . sodium chloride 20 mL/hr (04/15/11 0930)  . heparin 1,600 Units/hr (04/19/11 0335)  . DISCONTD: feeding supplement (GLUCERNA 1.2 CAL) 1,000 mL (04/17/11 1604)  . DISCONTD: propofol 15 mcg/kg/min (04/17/11 2100)    Assessment: 70 yom on IV heparin for treatment of NSTEMI. Heparin level this AM is slightly subtherapeutic. No bleeding or other problems noted. CBC has been slightly trending downward.   Goal of Therapy:  Heparin level 0.3-0.7 units/ml   Plan:  1. Increase heparin to 1700units/hr 2. F/u AM heparin level  Demita Tobia, [name] 04/19/2011,10:37 AM

## 2011-04-20 ENCOUNTER — Inpatient Hospital Stay (HOSPITAL_COMMUNITY): Payer: Medicare Other

## 2011-04-20 LAB — HEPARIN LEVEL (UNFRACTIONATED): Heparin Unfractionated: 0.36 IU/mL (ref 0.30–0.70)

## 2011-04-20 LAB — BASIC METABOLIC PANEL
BUN: 12 mg/dL (ref 6–23)
CO2: 21 mEq/L (ref 19–32)
Chloride: 100 mEq/L (ref 96–112)
Creatinine, Ser: 0.65 mg/dL (ref 0.50–1.35)
Glucose, Bld: 312 mg/dL — ABNORMAL HIGH (ref 70–99)
Potassium: 5 mEq/L (ref 3.5–5.1)

## 2011-04-20 LAB — GLUCOSE, CAPILLARY
Glucose-Capillary: 188 mg/dL — ABNORMAL HIGH (ref 70–99)
Glucose-Capillary: 238 mg/dL — ABNORMAL HIGH (ref 70–99)

## 2011-04-20 LAB — URINALYSIS, MICROSCOPIC ONLY
Glucose, UA: >1000 mg/dL — AB
Leukocytes, UA: NEGATIVE
pH: 6 (ref 5.0–8.0)

## 2011-04-20 LAB — CBC
HCT: 33.9 % — ABNORMAL LOW (ref 39.0–52.0)
Hemoglobin: 11.5 g/dL — ABNORMAL LOW (ref 13.0–17.0)
MCHC: 31.4 g/dL (ref 30.0–36.0)
MCV: 86.7 fL (ref 78.0–100.0)
Platelets: 548 10*3/uL — ABNORMAL HIGH (ref 150–400)
RBC: 3.91 MIL/uL — ABNORMAL LOW (ref 4.22–5.81)
RDW: 14 % (ref 11.5–15.5)
WBC: 15.2 10*3/uL — ABNORMAL HIGH (ref 4.0–10.5)
WBC: 18.3 10*3/uL — ABNORMAL HIGH (ref 4.0–10.5)

## 2011-04-20 LAB — IRON AND TIBC: Iron: 18 ug/dL — ABNORMAL LOW (ref 42–135)

## 2011-04-20 MED ORDER — PSYLLIUM 95 % PO PACK
1.0000 | PACK | Freq: Every day | ORAL | Status: DC
  Filled 2011-04-20 (×3): qty 1

## 2011-04-20 MED ORDER — FUROSEMIDE 10 MG/ML IJ SOLN
40.0000 mg | Freq: Every day | INTRAMUSCULAR | Status: DC
  Administered 2011-04-20: 40 mg via INTRAVENOUS
  Filled 2011-04-20 (×2): qty 4

## 2011-04-20 MED ORDER — LOPERAMIDE HCL 2 MG PO CAPS
2.0000 mg | ORAL_CAPSULE | Freq: Two times a day (BID) | ORAL | Status: DC
  Administered 2011-04-20 – 2011-04-26 (×13): 2 mg via ORAL
  Filled 2011-04-20 (×14): qty 1

## 2011-04-20 MED ORDER — TRAMADOL HCL 50 MG PO TABS
50.0000 mg | ORAL_TABLET | Freq: Four times a day (QID) | ORAL | Status: DC | PRN
  Administered 2011-04-20 – 2011-04-24 (×8): 50 mg via ORAL
  Filled 2011-04-20 (×5): qty 1

## 2011-04-20 MED ORDER — PIPERACILLIN-TAZOBACTAM 3.375 G IVPB
3.3750 g | Freq: Three times a day (TID) | INTRAVENOUS | Status: DC
  Administered 2011-04-20 – 2011-04-21 (×3): 3.375 g via INTRAVENOUS
  Filled 2011-04-20 (×5): qty 50

## 2011-04-20 MED ORDER — SIMVASTATIN 40 MG PO TABS
40.0000 mg | ORAL_TABLET | Freq: Every day | ORAL | Status: DC
  Administered 2011-04-20 – 2011-04-25 (×6): 40 mg via ORAL
  Filled 2011-04-20 (×7): qty 1

## 2011-04-20 MED ORDER — ACETAMINOPHEN 325 MG PO TABS
650.0000 mg | ORAL_TABLET | ORAL | Status: AC
  Administered 2011-04-20: 650 mg via ORAL
  Filled 2011-04-20: qty 2

## 2011-04-20 MED ORDER — INSULIN ASPART 100 UNIT/ML ~~LOC~~ SOLN
0.0000 [IU] | Freq: Three times a day (TID) | SUBCUTANEOUS | Status: DC
  Administered 2011-04-20: 8 [IU] via SUBCUTANEOUS
  Administered 2011-04-20: 3 [IU] via SUBCUTANEOUS
  Administered 2011-04-21: 5 [IU] via SUBCUTANEOUS
  Administered 2011-04-21: 11 [IU] via SUBCUTANEOUS
  Administered 2011-04-21: 8 [IU] via SUBCUTANEOUS
  Administered 2011-04-22: 11 [IU] via SUBCUTANEOUS
  Administered 2011-04-22: 3 [IU] via SUBCUTANEOUS
  Administered 2011-04-22 – 2011-04-23 (×2): 8 [IU] via SUBCUTANEOUS
  Administered 2011-04-23 (×2): 3 [IU] via SUBCUTANEOUS
  Administered 2011-04-24 (×2): 2 [IU] via SUBCUTANEOUS
  Administered 2011-04-24: 5 [IU] via SUBCUTANEOUS
  Administered 2011-04-25 (×2): 3 [IU] via SUBCUTANEOUS
  Administered 2011-04-25: 2 [IU] via SUBCUTANEOUS
  Administered 2011-04-26: 3 [IU] via SUBCUTANEOUS
  Filled 2011-04-20 (×2): qty 3

## 2011-04-20 MED ORDER — RISAQUAD PO CAPS
1.0000 | ORAL_CAPSULE | Freq: Every day | ORAL | Status: DC
  Administered 2011-04-20 – 2011-04-26 (×7): 1 via ORAL
  Filled 2011-04-20 (×7): qty 1

## 2011-04-20 MED ORDER — MUSCLE RUB 10-15 % EX CREA
TOPICAL_CREAM | CUTANEOUS | Status: DC | PRN
Start: 2011-04-20 — End: 2011-04-26
  Administered 2011-04-20 – 2011-04-23 (×4): via TOPICAL
  Filled 2011-04-20: qty 85

## 2011-04-20 MED ORDER — PANTOPRAZOLE SODIUM 40 MG PO PACK
40.0000 mg | PACK | ORAL | Status: DC
  Administered 2011-04-20 – 2011-04-23 (×4): 40 mg via ORAL
  Filled 2011-04-20 (×5): qty 20

## 2011-04-20 MED ORDER — ACETAMINOPHEN 325 MG PO TABS
650.0000 mg | ORAL_TABLET | Freq: Four times a day (QID) | ORAL | Status: DC | PRN
  Administered 2011-04-22 – 2011-04-23 (×2): 650 mg via ORAL
  Filled 2011-04-20 (×2): qty 2

## 2011-04-20 MED ORDER — TRAMADOL HCL 50 MG PO TABS
50.0000 mg | ORAL_TABLET | Freq: Four times a day (QID) | ORAL | Status: DC | PRN
  Filled 2011-04-20 (×3): qty 1

## 2011-04-20 MED ORDER — ASPIRIN 81 MG PO CHEW
162.0000 mg | CHEWABLE_TABLET | Freq: Every day | ORAL | Status: DC
  Administered 2011-04-21 – 2011-04-23 (×3): 162 mg via ORAL
  Filled 2011-04-20 (×3): qty 2

## 2011-04-20 MED ORDER — METOPROLOL TARTRATE 25 MG/10 ML ORAL SUSPENSION
25.0000 mg | Freq: Two times a day (BID) | ORAL | Status: DC
  Administered 2011-04-20 – 2011-04-24 (×7): 25 mg via ORAL
  Filled 2011-04-20 (×10): qty 10

## 2011-04-20 NOTE — Progress Notes (Signed)
Pt O2 sat decreased to 88% non-sustained multiple times but pt w/ no complaints of difficulty breathing or tachypnea.  Pt O2 sat decreased to 78% with activity to [facility]  O2 increased to 6L via [place].  O2 sat increased to 94% on 6 L.  Will continue to monitor patient.

## 2011-04-20 NOTE — H&P (Signed)
Worsening CXR, worsening WBC, now with chills, some crackles BL bases: plan to restart Zosyn for broader coverage.  He is elderly and may not mount a good febrile response.  Nurse also relates he has increasing urinary urgency with high urinary output (no hesitancy) so will obtain U/A.    Also of note, BNP >1000 on 2/20, with pleural effusions on CXR and crackles, known CHF with EF 30%.  He is fluid overloaded.   Being treated with Beta blocker, ACE.  Plan to add diuretic.    Grey Rakestraw,JEFF. 4:55 PM 04/20/2011

## 2011-04-20 NOTE — Progress Notes (Signed)
Family Medicine Teaching Service [facility] Progress Note  Patient name: Daniel Tyler Medical record number: [id number] Date of birth: Jul 28, 1940 Age: 71 y.o. Gender: male    LOS: 6 days   Primary Care Provider: Dois Davenport., MD, MD  Overnight Events:  Desaturation overnight to 70% for several minutes but auto recovered. Doing well this morning. Complaining of neck pain, and difficulty sleeping last night due to pain and frequent BMs. Taking PO w/o difficulty  Objective: Vital signs in last 24 hours: Temp:  [97 F (36.1 C)-98.6 F (37 C)] 98 F (36.7 C) (02/24 0500) Pulse Rate:  [69-83] 72  (02/24 0500) Resp:  [14-22] 20  (02/24 0500) BP: (104-148)/(49-77) 137/77 mmHg (02/24 0500) SpO2:  [90 %-98 %] 98 % (02/24 0500) Weight:  [145 lb (65.772 kg)] 145 lb (65.772 kg) (02/23 1721)  Wt Readings from Last 3 Encounters:  04/19/11 145 lb (65.772 kg)     Current Facility-Administered Medications  Medication Dose Route Frequency Provider Last Rate Last Dose  . 0.9 %  sodium chloride infusion   Intravenous Continuous Coralyn Helling, MD 20 mL/hr at 04/15/11 0930 20 mL/hr at 04/15/11 0930  . acetaminophen (TYLENOL) tablet 650 mg  650 mg Per Tube Q6H PRN Coralyn Helling, MD   650 mg at 04/19/11 1805  . acetaminophen (TYLENOL) tablet 650 mg  650 mg Oral NOW Marena Chancy, MD   650 mg at 04/20/11 0400  . acetaminophen (TYLENOL) tablet 975 mg  975 mg Oral Once Dessa Phi, MD   975 mg at 04/19/11 2313  . albuterol (PROVENTIL) (5 MG/ML) 0.5% nebulizer solution 2.5 mg  2.5 mg Nebulization Q2H PRN Clementeen Graham, MD   2.5 mg at 04/19/11 2331  . antiseptic oral rinse (BIOTENE) solution 15 mL  15 mL Mouth Rinse QID Leslye Peer, MD   15 mL at 04/19/11 0400  . aspirin chewable tablet 162 mg  162 mg Per Tube Daily Coralyn Helling, MD   162 mg at 04/19/11 1023  . chlorhexidine (PERIDEX) 0.12 % solution 15 mL  15 mL Mouth Rinse BID Leslye Peer, MD   15 mL at 04/19/11 0751  . enalapril (VASOTEC) tablet 5  mg  5 mg Oral Daily Lucianne Muss [place], MD   5 mg at 04/19/11 1013  . fentaNYL (SUBLIMAZE) injection 25-100 mcg  25-100 mcg Intravenous Q2H PRN Billy Fischer, MD   100 mcg at 04/17/11 1937  . guaiFENesin (MUCINEX) 12 hr tablet 600 mg  600 mg Oral BID Josalyn Funches, MD   600 mg at 04/20/11 0023  . heparin ADULT infusion 100 units/ml (25000 units/250 ml)  1,700 Units/hr Intravenous Continuous [name] Rumbarger, PHARMD 17 mL/hr at 04/19/11 1100 1,700 Units/hr at 04/19/11 1100  . insulin aspart (novoLOG) injection 0-15 Units  0-15 Units Subcutaneous TID WC Marena Chancy, MD      . levofloxacin (LEVAQUIN) tablet 500 mg  500 mg Oral Daily Lucianne Muss [place], MD   500 mg at 04/19/11 1012  . metoprolol tartrate (LOPRESSOR) 25 mg/10 mL oral suspension 25 mg  25 mg Per Tube Q12H Coralyn Helling, MD   25 mg at 04/19/11 2313  . Muscle Rub CREA   Topical PRN Marena Chancy, MD      . pantoprazole sodium (PROTONIX) 40 mg/20 mL oral suspension 40 mg  40 mg Per Tube Q24H Coralyn Helling, MD   40 mg at 04/19/11 2313  . phenol (CHLORASEPTIC) mouth spray 1 spray  1 spray Mouth/Throat PRN Demetria Pore, MD      .  potassium chloride SA (K-DUR,KLOR-CON) 20 MEQ CR tablet           . potassium chloride SA (K-DUR,KLOR-CON) CR tablet 40 mEq  40 mEq Oral Once Leslye Peer, MD   40 mEq at 04/19/11 1019  . potassium chloride SA (K-DUR,KLOR-CON) CR tablet 40 mEq  40 mEq Oral Once Leslye Peer, MD   40 mEq at 04/19/11 1532  . potassium chloride SA (K-DUR,KLOR-CON) CR tablet 40 mEq  40 mEq Oral Once Dessa Phi, MD   40 mEq at 04/19/11 2314  . simvastatin (ZOCOR) tablet 40 mg  40 mg Per Tube q1800 Lonia Farber, MD   40 mg at 04/19/11 1805  . DISCONTD: insulin aspart (novoLOG) injection 0-15 Units  0-15 Units Subcutaneous Q4H Lonia Farber, MD   3 Units at 04/19/11 1532  . DISCONTD: piperacillin-tazobactam (ZOSYN) IVPB 3.375 g  3.375 g Intravenous Q8H Ethelda Chick, MD   3.375 g at 04/19/11 0313      PE: Gen:NAD HEENT: MMM CV: RRR, faint heart sounds. No appreciable murmur Res: Diffuse ronchi and wheezes. Poor air movement. On 6L [place]. Mild egophany L>R Ext/Musc: No edema. 2+ pulses, no neck stiffness or rigidity. Muscle point tenderness along the L trapezius muscle Neuro:CN grossly intact. AAO3  Labs/Studies:  CBG (last 3)   Basename 04/19/11 2152 04/19/11 1716 04/19/11 1527  GLUCAP 184* 124* 155*    Cdiff: Negative  CBC:    Component Value Date/Time   WBC 15.2* 04/20/2011 0955   HGB 10.5* 04/20/2011 0955   HCT 33.4* 04/20/2011 0955   PLT 515* 04/20/2011 0955   MCV 88.1 04/20/2011 0955     Basic Metabolic Panel:    Component Value Date/Time   NA 134* 04/20/2011 0955   K 5.0 04/20/2011 0955   CL 100 04/20/2011 0955   CO2 21 04/20/2011 0955   BUN 12 04/20/2011 0955   CREATININE 0.65 04/20/2011 0955   GLUCOSE 312* 04/20/2011 0955   CALCIUM 9.2 04/20/2011 0955     BMET, CBC, Heparin: pending  Assessment/Plan:  71yo male w/ severe pneumonia and sepsis requiring 6 day stay in ICU including intubation, sent to floor yesterday.  1. Pneumonia: Extubated yesterday. Overnight hypoxemia w/ self recovery. Respiratory status poor this am. Not sure of baseline.  - CXR - Continue O2 support - incentive spirometer - Continue Levaquin 500 Qday for a total of 8 days of ABX (Day 6) - PT/OT  2. NSTEMI: Cardiology following. Appreciated input. Secondary to demand ischemia. On heparin. -Continue w/ Heparin gtt per cards.  - Continue lopressor 25BID, ASA, Zocor  3. DM: Carb mod diet. Well controlled. Consider restarting home glipizide, lantus, and metformin - continue SSI  4. Prophylaxis: - Heparin - Protonix  5. Neck pain: likely Musculoskeletal, no nuchal rigidity or AMS. Muscular point tenderness on exam.  - will monitor  FEN/GI: complaining of persistent diarrhea since DC from ICU. C.Diff negative. Likely diarrhea induced.  - Imodium, metamucil, and probiotics -  KVO - BMET in am  6. Disposition: pending improvement  Signed: Shelly Flatten, MD Family Medicine Resident PGY-1 [phone] 04/20/2011 9:40 AM

## 2011-04-20 NOTE — Evaluation (Signed)
Elmon Shader [place],PT Acute Rehabilitation [phone] (pager)  

## 2011-04-20 NOTE — Evaluation (Signed)
Physical Therapy Evaluation Patient Details Name: Daniel Tyler MRN: [id number] DOB: 09-May-1940 Today's Date: 04/20/2011  Problem List: There is no problem list on file for this patient.   Past Medical History:  Past Medical History  Diagnosis Date  . Diabetes mellitus   . Enlarged heart    Past Surgical History: History reviewed. No pertinent past surgical history.  PT Assessment/Plan/Recommendation PT Assessment Clinical Impression Statement: Pt admitted with SOB, cough and pneumonia. Pt on 6/L of supplemental O2 on nasal canula. SPO2 stayed above 95% during whole treatment. Pt needing little assistance with bed mobility and transfers today. Unable to walk with pt secondary to pt needing a chest xray done, will walk and further assess pts balance next visit. PT recommend HHPT for follow up therapy at D/C. PT Recommendation/Assessment: Patient will need skilled PT in the acute care venue PT Problem List: Decreased strength;Decreased range of motion;Decreased activity tolerance;Decreased balance;Decreased mobility;Decreased coordination;Decreased knowledge of use of DME;Decreased safety awareness;Decreased knowledge of precautions Barriers to Discharge: Decreased caregiver support PT Therapy Diagnosis : Generalized weakness PT Plan PT Frequency: Min 3X/week PT Treatment/Interventions: Gait training;Stair training;Functional mobility training;Therapeutic activities;Therapeutic exercise;Balance training;DME instruction;Patient/family education PT Recommendation Follow Up Recommendations: Home health PT Equipment Recommended: None recommended by PT (has RW at home) PT Goals  Acute Rehab PT Goals PT Goal Formulation: With patient Time For Goal Achievement: 7 days Pt will go Sit to Stand: Independently PT Goal: Sit to Stand - Progress: Goal set today Pt will go Stand to Sit: Independently PT Goal: Stand to Sit - Progress: Goal set today Pt will Transfer Bed to Chair/Chair to Bed:  Independently PT Transfer Goal: Bed to Chair/Chair to Bed - Progress: Goal set today Pt will Ambulate: >150 feet;with least restrictive assistive device;Independently PT Goal: Ambulate - Progress: Goal set today Pt will Go Up / Down Stairs: 3-5 stairs;Independently;with least restrictive assistive device PT Goal: Up/Down Stairs - Progress: Goal set today Pt will Perform Home Exercise Program: Independently PT Goal: Perform Home Exercise Program - Progress: Goal set today  PT Evaluation Precautions/Restrictions  Precautions Precautions: Fall Prior Functioning  Home Living Lives With: Alone Receives Help From: Family Type of Home: Mobile home Home Layout: One level Home Access: Stairs to enter Entrance Stairs-Rails: Left Entrance Stairs-Number of Steps: 4-5 Bathroom Shower/Tub: Engineer, manufacturing systems: Standard Bathroom Accessibility: Yes How Accessible: Accessible via walker Home Adaptive Equipment: Walker - rolling Prior Function Level of Independence: Independent with basic ADLs;Independent with transfers;Independent with homemaking with ambulation;Independent with gait Driving: Yes Vocation: Retired Producer, television/film/video: Awake/alert Overall Cognitive Status: Appears within functional limits for tasks assessed Orientation Level: Oriented X4 Sensation/Coordination Sensation Light Touch: Appears Intact Stereognosis: Not tested Hot/Cold: Not tested Proprioception: Not tested Coordination Gross Motor Movements are Fluid and Coordinated: Yes Fine Motor Movements are Fluid and Coordinated: Yes Extremity Assessment RUE Assessment RUE Assessment: Within Functional Limits LUE Assessment LUE Assessment: Within Functional Limits RLE Assessment RLE Assessment: Within Functional Limits LLE Assessment LLE Assessment: Within Functional Limits Mobility (including Balance) Bed Mobility Bed Mobility: Yes Supine to Sit: 5: Supervision Supine to Sit  Details (indicate cue type and reason): needing cueing on hand placement/progression and extended amount of time to complete transfer Sitting - Scoot to Edge of Bed: 5: Supervision Sitting - Scoot to [place] of Bed Details (indicate cue type and reason): needing cueing on progression Sit to Supine: 5: Supervision Sit to Supine - Details (indicate cue type and reason): needing cueing on progression Transfers Transfers: Yes Sit to Stand:  4: Min assist;From toilet;From bed Sit to Stand Details (indicate cue type and reason): pt needing cueing to push up from the bed and then grab onto the RW; x2 Stand to Sit: 5: Supervision;To bed;To toilet Stand to Sit Details: needing cueing on progression and to slow down transfer; x2 Stand Pivot Transfers: 4: Min assist Stand Pivot Transfer Details (indicate cue type and reason): pt used RW to piviot to [facility] and then back to bed; needed cueing on on progression/hand placement  Ambulation/Gait Ambulation/Gait: No Stairs: No Wheelchair Mobility Wheelchair Mobility: No  Posture/Postural Control Posture/Postural Control: No significant limitations Balance Balance Assessed: No End of Session PT - End of Session Equipment Utilized During Treatment: Gait belt Activity Tolerance: Patient tolerated treatment well Patient left: in bed;with call bell in reach Nurse Communication: Mobility status for transfers General Behavior During Session: [facility] for tasks performed Cognition: [facility] for tasks performed  Elvera Bicker 04/20/2011, 12:31 PM

## 2011-04-20 NOTE — Progress Notes (Signed)
Pt tachypneic at 32 respirations/minute with O2 sat decreasing to 87% non-sustain.   Pt maintaining O2 sat of 89-90% on 4 L [place].  Pt.'s Oxygen increased to 5 L via [place].  O2 sat increasing to 92% with 5L [place]. Will continue to monitor.

## 2011-04-20 NOTE — Progress Notes (Signed)
SUBJECTIVE:  Had significant problems with O2 desats last PM but feeling better today  OBJECTIVE:   Vitals:   Filed Vitals:   04/19/11 1721 04/19/11 2100 04/20/11 0500 04/20/11 0947  BP: 121/71 135/76 137/77 138/87  Pulse: 78 79 72   Temp: 97.2 F (36.2 C) 97 F (36.1 C) 98 F (36.7 C)   TempSrc: Oral     Resp: 20 20 20    Height: 5\' 9"  (1.753 m)     Weight: 65.772 kg (145 lb)     SpO2: 93% 90% 98%    I&O's:   Intake/Output Summary (Last 24 hours) at 04/20/11 0948 Last data filed at 04/20/11 0900  Gross per 24 hour  Intake    971 ml  Output   1400 ml  Net   -429 ml   TELEMETRY: Reviewed telemetry pt in NSR     PHYSICAL EXAM General: Well developed, well nourished, in no acute distress Head: Eyes PERRLA, No xanthomas.   Normal cephalic and atramatic  Lungs:   Few scattered ronchi Heart:   HRRR S1 S2 Pulses are 2+ & equal. Abdomen: Bowel sounds are positive, abdomen soft and non-tender without masses  Extremities:   No clubbing, cyanosis or edema.  DP +1 Neuro: Alert and oriented X 3. Psych:  Good affect, responds appropriately   LABS: Basic Metabolic Panel:  Basename 04/19/11 0930 04/19/11 0546  NA 137 135  K 2.5* 3.1*  CL 106 99  CO2 21 26  GLUCOSE 205* 147*  BUN 10 13  CREATININE 0.45* 0.58  CALCIUM 6.8* 8.6  MG -- --  PHOS -- --   CBC:  Basename 04/19/11 0930 04/19/11 0546  WBC 12.9* 15.1*  NEUTROABS -- --  HGB 8.5* 9.5*  HCT 25.5* 27.6*  MCV 87.6 86.3  PLT 354 406*     RADIOLOGY: Ct Head Wo Contrast  04/14/2011  *RADIOLOGY REPORT*  Clinical Data:  Weakness.  Fall  CT HEAD WITHOUT CONTRAST CT CERVICAL SPINE WITHOUT CONTRAST  Technique:  Multidetector CT imaging of the head and cervical spine was performed following the standard protocol without intravenous contrast.  Multiplanar CT image reconstructions of the cervical spine were also generated.  Comparison:   None  CT HEAD  Findings: Age appropriate atrophy.  Negative for hemorrhage. Negative  for acute infarct or mass.  Chronic sinusitis with mucosal thickening in the paranasal sinuses. Calvarium is intact.  IMPRESSION: No acute intracranial abnormality.  Sinusitis.  CT CERVICAL SPINE  Findings: Negative for fracture.  Cervical disc degeneration and moderate to advanced spondylosis at C3-4, C4-5, and C5-6.  Milder spondylosis at C6-7.  Carotid atherosclerotic calcification is present.  IMPRESSION: Negative for fracture.  Original Report Authenticated By: Camelia Phenes, [name]   Ct Cervical Spine Wo Contrast  04/14/2011  *RADIOLOGY REPORT*  Clinical Data:  Weakness.  Fall  CT HEAD WITHOUT CONTRAST CT CERVICAL SPINE WITHOUT CONTRAST  Technique:  Multidetector CT imaging of the head and cervical spine was performed following the standard protocol without intravenous contrast.  Multiplanar CT image reconstructions of the cervical spine were also generated.  Comparison:   None  CT HEAD  Findings: Age appropriate atrophy.  Negative for hemorrhage. Negative for acute infarct or mass.  Chronic sinusitis with mucosal thickening in the paranasal sinuses. Calvarium is intact.  IMPRESSION: No acute intracranial abnormality.  Sinusitis.  CT CERVICAL SPINE  Findings: Negative for fracture.  Cervical disc degeneration and moderate to advanced spondylosis at C3-4, C4-5, and C5-6.  Milder spondylosis  at C6-7.  Carotid atherosclerotic calcification is present.  IMPRESSION: Negative for fracture.  Original Report Authenticated By: Camelia Phenes, [name]   Dg Chest Port 1 View  04/19/2011  *RADIOLOGY REPORT*  Clinical Data: Extubation.  PORTABLE CHEST - 1 VIEW  Comparison: 04/18/2011  Findings: Endotracheal tube has been removed as has the NG tube. Right central line is unchanged.  The There is hyperinflation of the lungs compatible with COPD.  Bilateral lower lobe opacities, right greater than left again noted, not significantly changed given differences in technique.  Right lower lobe opacities suspicious for  pneumonia.  IMPRESSION: Interval extubation.  Otherwise no significant change.  Original Report Authenticated By: Cyndie Chime, [name]   Dg Chest Port 1 View  04/18/2011  *RADIOLOGY REPORT*  Clinical Data: Evaluate endotracheal tube position  PORTABLE CHEST - 1 VIEW  Comparison: Portable chest x-ray of 04/17/2011  Findings: The tip of the endotracheal tube is approximally 5.7 cm above the carina.  A right IJ central venous line remains with the tip in the mid lower SVC.  No pneumothorax is seen. Opacities remain at the lung bases right greater than left most consistent with pneumonia.  Heart size is stable.  IMPRESSION:  1.  Bibasilar opacities right greater than right suspicious for pneumonia. 2.  Endotracheal tip 5.2 cm above the carina.  Original Report Authenticated By: Juline Patch, [name]   Portable Chest Xray In Am  04/17/2011  *RADIOLOGY REPORT*  Clinical Data: Reintubated.  PORTABLE CHEST - 1 VIEW  Comparison: 04/16/2011.  Findings: Basilar air space disease greater on the right.  Findings suspicious for infectious infiltrate.  Asymmetric pulmonary edema is a secondary less likely consideration.  Endotracheal tube tip 6 cm above the carina.  Right central line tip mid superior vena cava level.  No gross pneumothorax. Nasogastric tube courses below the diaphragm.  The tip is not included on this exam.  Heart size within normal limits.  Calcified aorta.  IMPRESSION: Basilar air space disease greater on the right.  Findings suspicious for infectious infiltrate. Appearance without significant change.  Original Report Authenticated By: Fuller Canada, [name]   Dg Chest Port 1 View  04/16/2011  *RADIOLOGY REPORT*  Clinical Data: Intubated.  Shortness of breath.  PORTABLE CHEST - 1 VIEW  Comparison: Earlier today.  Findings: Interval endotracheal tube in satisfactory position. Stable right subclavian catheter and nasogastric tube.  Normal sized heart.  Significant increase in patchy airspace opacity in the  right mid and lower lung zones.  Increased airspace opacity in the left lower lung zone.  The overall lung volumes remain hyperexpanded.  Interval probable small right pleural effusion.  IMPRESSION:  1.  Endotracheal tube in satisfactory position. 2.  Worsening bilateral pneumonia.  Original Report Authenticated By: Darrol Angel, [name]   Dg Chest Port 1 View  04/16/2011  *RADIOLOGY REPORT*  Clinical Data: Follow up pneumonia  PORTABLE CHEST - 1 VIEW  Comparison: 04/15/2011  Findings: Cardiomediastinal silhouette is stable.  Stable endotracheal and NG tube position.  Right subclavian catheter is stable in position.  No convincing pulmonary edema.  Hazy infiltrate/pneumonia again noted bilateral basilar right greater than left.  IMPRESSION: Stable support apparatus.  No significant change.  Persistent infiltrate/pneumonia bilateral basilar right greater than left.  Original Report Authenticated By: Natasha Mead, [name]   Dg Chest Port 1 View  04/15/2011  *RADIOLOGY REPORT*  Clinical Data: Central line placement.  PORTABLE CHEST - 1 VIEW  Comparison: Chest radiograph performed earlier today  at 01:09 a.m.  Findings: The patient's right subclavian line is noted ending about the mid to distal SVC.  The endotracheal tube is seen ending 5-6 cm above the carina.  An enteric tube is noted extending below the diaphragm.  The lungs are well-aerated.  There is worsened right basilar airspace opacification, and slightly worsened mild left basilar airspace opacity, concerning for multifocal pneumonia.  There is no evidence of pleural effusion or pneumothorax.  The heart is normal in size; the mediastinal contour is within normal limits.  No acute osseous abnormalities are seen.  IMPRESSION:  1.  Right subclavian line noted ending about the mid to distal SVC. 2.  Worsening multifocal pneumonia, particularly at the right lung base. 3.  Endotracheal tube ends 5-6 cm above the carina.  Original Report Authenticated By: Tonia Ghent, [name]   Dg Chest Port 1 View  04/15/2011  *RADIOLOGY REPORT*  Clinical Data: Shortness of breath.  Clinical concern for fluid overload.  PORTABLE CHEST - 1 VIEW  Comparison: 04/14/2011.  Findings: Significant increase in right lower lobe airspace opacity with interval mild left basilar airspace opacity.  Normal sized heart.  No pleural fluid.  Unremarkable bones.  IMPRESSION:  1.  Worsening right lower lobe pneumonia. 2.  Interval probable pneumonia at the left lung base.  Original Report Authenticated By: Darrol Angel, [name]   Dg Chest Port 1 View  04/14/2011  *RADIOLOGY REPORT*  Clinical Data: Right-sided chest pain  PORTABLE CHEST - 1 VIEW  Comparison: None.  Findings: Right lower lobe airspace disease, possible pneumonia.  COPD with hyperinflation.  Apical emphysema.  No pleural effusion. Vascularity is normal.  IMPRESSION: COPD.  Right lower lobe infiltrate, suspicious for pneumonia.  Original Report Authenticated By: Camelia Phenes, [name]     ASSESSMENT:  1. Bilateral pneumonia and sepsis  2. NSTEMI  3. Mild CHF  4. Hypokalemia 5. Anemia with Hbg now 8.5 6.  Hypocalcemia - ? Secondary to low albumin   PLAN:   1.  Replete potassium - no BMET back today so will check stat BMET to assess potassium before further repletion 2.  Continue ACE I/ASA/beta blocker/statin 3.  D/C IV heparin due to anemia 4.  Heme check stools and send off iron panel 5.  Check ionized calcium and albumin, magnesium  Quintella Reichert, MD  04/20/2011  9:48 AM

## 2011-04-20 NOTE — Progress Notes (Signed)
ANTIBIOTIC CONSULT NOTE - INITIAL  Pharmacy Consult for zosyn Indication: rule out pneumonia  No Known Allergies  Patient Measurements: Height: 5\' 9"  (175.3 cm) Weight: 145 lb (65.772 kg) IBW/kg (Calculated) : 70.7   Vital Signs: Temp: 97.9 F (36.6 C) (02/24 1400) Temp src: Oral (02/24 1400) BP: 157/78 mmHg (02/24 1400) Pulse Rate: 82  (02/24 1400) Intake/Output from previous day: 02/23 0701 - 02/24 0700 In: 1283 [P.O.:820; I.V.:463] Out: 1556 [Urine:1056; Stool:500] Intake/Output from this shift: Total I/O In: 300 [P.O.:300] Out: 1200 [Urine:1200]  Labs:  [facility] 04/20/11 1150 04/20/11 0955 04/19/11 0930 04/19/11 0546  WBC 18.3* 15.2* 12.9* --  HGB 11.5* 10.5* 8.5* --  PLT 548* 515* 354 --  LABCREA -- -- -- --  CREATININE -- 0.65 0.45* 0.58   Estimated Creatinine Clearance: 80 ml/min (by C-G formula based on Cr of 0.65). No results found for this basename: VANCOTROUGH:2,VANCOPEAK:2,VANCORANDOM:2,GENTTROUGH:2,GENTPEAK:2,GENTRANDOM:2,TOBRATROUGH:2,TOBRAPEAK:2,TOBRARND:2,AMIKACINPEAK:2,AMIKACINTROU:2,AMIKACIN:2, in the last 72 hours   Microbiology: Recent Results (from the past 720 hour(s))  CULTURE, BLOOD (ROUTINE X 2)     Status: Normal (Preliminary result)   Collection Time   04/14/11  6:00 PM      Component Value Range Status Comment   Specimen Description BLOOD ARM RIGHT   Final    Special Requests BOTTLES DRAWN AEROBIC AND ANAEROBIC 10CC   Final    Culture  Setup Time [id number]   Final    Culture     Final    Value:        BLOOD CULTURE RECEIVED NO GROWTH TO DATE CULTURE WILL BE HELD FOR 5 DAYS BEFORE ISSUING A FINAL NEGATIVE REPORT   Report Status PENDING   Incomplete   CULTURE, BLOOD (ROUTINE X 2)     Status: Normal (Preliminary result)   Collection Time   04/14/11  6:15 PM      Component Value Range Status Comment   Specimen Description BLOOD HAND RIGHT   Final    Special Requests BOTTLES DRAWN AEROBIC AND ANAEROBIC 10CC   Final    Culture  Setup  Time [id number]   Final    Culture     Final    Value:        BLOOD CULTURE RECEIVED NO GROWTH TO DATE CULTURE WILL BE HELD FOR 5 DAYS BEFORE ISSUING A FINAL NEGATIVE REPORT   Report Status PENDING   Incomplete   URINE CULTURE     Status: Normal   Collection Time   04/14/11  6:33 PM      Component Value Range Status Comment   Specimen Description URINE, CLEAN CATCH   Final    Special Requests NONE   Final    Culture  Setup Time [id number]   Final    Colony Count NO GROWTH   Final    Culture NO GROWTH   Final    Report Status 04/15/2011 FINAL   Final   MRSA PCR SCREENING     Status: Normal   Collection Time   04/14/11 11:36 PM      Component Value Range Status Comment   MRSA by PCR NEGATIVE  NEGATIVE  Final   CLOSTRIDIUM DIFFICILE BY PCR     Status: Normal   Collection Time   04/19/11 11:45 PM      Component Value Range Status Comment   C difficile by pcr NEGATIVE  NEGATIVE  Final     Medical History: Past Medical History  Diagnosis Date  . Diabetes mellitus   . Enlarged heart  Medications:  Anti-infectives     Start     Dose/Rate Route Frequency Ordered Stop   04/20/11 1800  piperacillin-tazobactam (ZOSYN) IVPB 3.375 g       3.375 g 12.5 mL/hr over 240 Minutes Intravenous Every 8 hours 04/20/11 1658     04/19/11 1100   levofloxacin (LEVAQUIN) tablet 500 mg  Status:  Discontinued        500 mg Oral Daily 04/19/11 0943 04/20/11 1657   04/15/11 1200   vancomycin (VANCOCIN) IVPB 1000 mg/200 mL premix  Status:  Discontinued        1,000 mg 200 mL/hr over 60 Minutes Intravenous Every 18 hours 04/14/11 2239 04/15/11 1026   04/15/11 1200   vancomycin (VANCOCIN) IVPB 1000 mg/200 mL premix  Status:  Discontinued        1,000 mg 200 mL/hr over 60 Minutes Intravenous Every 12 hours 04/15/11 1026 04/17/11 1007   04/15/11 0400   ciprofloxacin (CIPRO) IVPB 400 mg  Status:  Discontinued        400 mg 200 mL/hr over 60 Minutes Intravenous Every 12 hours 04/15/11 0356 04/16/11  1001   04/15/11 0230   piperacillin-tazobactam (ZOSYN) IVPB 3.375 g  Status:  Discontinued        3.375 g 12.5 mL/hr over 240 Minutes Intravenous Every 8 hours 04/14/11 2239 04/19/11 0943   04/14/11 1745   vancomycin (VANCOCIN) IVPB 1000 mg/200 mL premix        1,000 mg 200 mL/hr over 60 Minutes Intravenous  Once 04/14/11 1745 04/14/11 2003   04/14/11 1745  piperacillin-tazobactam (ZOSYN) IVPB 3.375 g       3.375 g 12.5 mL/hr over 240 Minutes Intravenous  Once 04/14/11 1746 04/14/11 1855         Assessment: 70 yom completed corse of vanc + zosyn and levaquin. Now restarting zosyn d/t worsening CXR, increasing WBC and chills. Pt has good renal function. Previous cultures all negative.   Goal of Therapy:  Infection treatment  Plan:  Follow up culture results Zosyn 3.375gm IV Q8H (4 hour infusion)  Elynore Dolinski, [name] 04/20/2011,4:58 PM

## 2011-04-21 LAB — GLUCOSE, CAPILLARY
Glucose-Capillary: 214 mg/dL — ABNORMAL HIGH (ref 70–99)
Glucose-Capillary: 252 mg/dL — ABNORMAL HIGH (ref 70–99)
Glucose-Capillary: 302 mg/dL — ABNORMAL HIGH (ref 70–99)

## 2011-04-21 LAB — BASIC METABOLIC PANEL
BUN: 13 mg/dL (ref 6–23)
CO2: 25 mEq/L (ref 19–32)
Calcium: 8.9 mg/dL (ref 8.4–10.5)
Chloride: 99 mEq/L (ref 96–112)
Creatinine, Ser: 0.61 mg/dL (ref 0.50–1.35)
GFR calc Af Amer: >90 mL/min (ref >90)
GFR calc non Af Amer: >90 mL/min (ref >90)
Glucose, Bld: 229 mg/dL — ABNORMAL HIGH (ref 70–99)
Potassium: 3.9 mEq/L (ref 3.5–5.1)
Sodium: 135 mEq/L (ref 135–145)

## 2011-04-21 LAB — CULTURE, BLOOD (ROUTINE X 2)
Culture  Setup Time: 201302190158
Culture: NO GROWTH

## 2011-04-21 LAB — CBC
Platelets: 553 10*3/uL — ABNORMAL HIGH (ref 150–400)
RDW: 13.9 % (ref 11.5–15.5)
WBC: 14.7 10*3/uL — ABNORMAL HIGH (ref 4.0–10.5)

## 2011-04-21 MED ORDER — FUROSEMIDE 10 MG/ML IJ SOLN
20.0000 mg | Freq: Every day | INTRAMUSCULAR | Status: DC
  Administered 2011-04-21 – 2011-04-22 (×2): 20 mg via INTRAVENOUS
  Filled 2011-04-21: qty 2

## 2011-04-21 MED ORDER — TIOTROPIUM BROMIDE MONOHYDRATE 18 MCG IN CAPS
18.0000 ug | ORAL_CAPSULE | Freq: Every day | RESPIRATORY_TRACT | Status: DC
  Administered 2011-04-22 – 2011-04-26 (×5): 18 ug via RESPIRATORY_TRACT
  Filled 2011-04-21 (×2): qty 5

## 2011-04-21 MED ORDER — GUAIFENESIN ER 600 MG PO TB12
1200.0000 mg | ORAL_TABLET | Freq: Two times a day (BID) | ORAL | Status: DC
  Administered 2011-04-21 – 2011-04-26 (×10): 1200 mg via ORAL
  Filled 2011-04-21 (×11): qty 2

## 2011-04-21 MED ORDER — LEVOFLOXACIN 500 MG PO TABS
500.0000 mg | ORAL_TABLET | Freq: Every day | ORAL | Status: DC
  Administered 2011-04-21 – 2011-04-23 (×3): 500 mg via ORAL
  Filled 2011-04-21 (×3): qty 1

## 2011-04-21 MED ORDER — FUROSEMIDE 10 MG/ML IJ SOLN: 20.0000 mg | Freq: Every day | INTRAMUSCULAR | Status: DC

## 2011-04-21 MED ORDER — ENOXAPARIN SODIUM 40 MG/0.4ML ~~LOC~~ SOLN
40.0000 mg | SUBCUTANEOUS | Status: DC
  Administered 2011-04-21 – 2011-04-23 (×3): 40 mg via SUBCUTANEOUS
  Filled 2011-04-21 (×4): qty 0.4

## 2011-04-21 MED ORDER — BIOTENE DRY MOUTH MT LIQD
15.0000 mL | Freq: Two times a day (BID) | OROMUCOSAL | Status: DC
  Administered 2011-04-21 – 2011-04-26 (×9): 15 mL via OROMUCOSAL

## 2011-04-21 MED ORDER — ENALAPRIL MALEATE 10 MG PO TABS
10.0000 mg | ORAL_TABLET | Freq: Every day | ORAL | Status: DC
  Administered 2011-04-21 – 2011-04-26 (×6): 10 mg via ORAL
  Filled 2011-04-21 (×6): qty 1

## 2011-04-21 MED ORDER — KETOROLAC TROMETHAMINE 30 MG/ML IJ SOLN
30.0000 mg | Freq: Four times a day (QID) | INTRAMUSCULAR | Status: DC | PRN
  Administered 2011-04-21 – 2011-04-22 (×2): 30 mg via INTRAVENOUS
  Filled 2011-04-21 (×2): qty 1

## 2011-04-21 MED ORDER — ALBUTEROL SULFATE HFA 108 (90 BASE) MCG/ACT IN AERS
2.0000 | INHALATION_SPRAY | Freq: Four times a day (QID) | RESPIRATORY_TRACT | Status: DC | PRN
  Filled 2011-04-21: qty 6.7

## 2011-04-21 NOTE — Progress Notes (Signed)
Family Medicine Teaching Service Attending Note  On 2-24 I interviewed and examined patient Daniel Tyler and reviewed their tests and x-rays.  I discussed with Dr. Konrad Dolores and reviewed their note for today.  I agree with their assessment and plan.     Additionally  Complains of left neck pain No shortness of breath or chest pain. Continue antibiotics  Analgesics and compresses for neck pain Imodium for diarrhea Phys Therapy evaluation

## 2011-04-21 NOTE — Progress Notes (Signed)
Family Medicine Teaching Service [facility] Progress Note  Patient name: Daniel Tyler Medical record number: [id number] Date of birth: Aug 29, 1940 Age: 71 y.o. Gender: male    LOS: 7 days   Primary Care Provider: Dois Davenport., MD, MD  Overnight Events:  Some desats overnight to the 78-82% range. Self recovered. Feels well this morning. Contniues to complain of "neck" pain, and diarrhea, though improving. Tolerating regular diet.    Objective: Vital signs in last 24 hours: Temp:  [97.1 F (36.2 C)-98.4 F (36.9 C)] 98.4 F (36.9 C) (02/25 0500) Pulse Rate:  [81-93] 81  (02/25 0500) Resp:  [18-20] 18  (02/25 0500) BP: (111-157)/(64-87) 111/64 mmHg (02/25 0500) SpO2:  [94 %-100 %] 100 % (02/25 0500)  Wt Readings from Last 3 Encounters:  04/19/11 145 lb (65.772 kg)     Current Facility-Administered Medications  Medication Dose Route Frequency Provider Last Rate Last Dose  . 0.9 %  sodium chloride infusion   Intravenous Continuous Coralyn Helling, MD   20 mL/hr at 04/15/11 0930  . acetaminophen (TYLENOL) tablet 650 mg  650 mg Oral Q6H PRN Renold Don, MD      . acidophilus (RISAQUAD) capsule 1 capsule  1 capsule Oral Daily Shelly Flatten, MD   1 capsule at 04/20/11 1324  . albuterol (PROVENTIL) (5 MG/ML) 0.5% nebulizer solution 2.5 mg  2.5 mg Nebulization Q2H PRN Clementeen Graham, MD   2.5 mg at 04/21/11 0203  . antiseptic oral rinse (BIOTENE) solution 15 mL  15 mL Mouth Rinse QID Leslye Peer, MD   15 mL at 04/20/11 1500  . aspirin chewable tablet 162 mg  162 mg Oral Daily Renold Don, MD      . chlorhexidine (PERIDEX) 0.12 % solution 15 mL  15 mL Mouth Rinse BID Leslye Peer, MD   15 mL at 04/20/11 2146  . enalapril (VASOTEC) tablet 5 mg  5 mg Oral Daily Priscella Mann, MD   5 mg at 04/20/11 0947  . fentaNYL (SUBLIMAZE) injection 25-100 mcg  25-100 mcg Intravenous Q2H PRN Billy Fischer, MD   100 mcg at 04/17/11 1937  . furosemide (LASIX) injection 40 mg  40 mg Intravenous Daily Renold Don, MD   40 mg at 04/20/11 1759  . guaiFENesin (MUCINEX) 12 hr tablet 600 mg  600 mg Oral BID Dessa Phi, MD   600 mg at 04/20/11 2146  . insulin aspart (novoLOG) injection 0-15 Units  0-15 Units Subcutaneous TID WC Marena Chancy, MD   5 Units at 04/21/11 0805  . loperamide (IMODIUM) capsule 2 mg  2 mg Oral BID Shelly Flatten, MD   2 mg at 04/20/11 2146  . metoprolol tartrate (LOPRESSOR) 25 mg/10 mL oral suspension 25 mg  25 mg Oral Q12H Renold Don, MD   25 mg at 04/20/11 2146  . Muscle Rub CREA   Topical PRN Marena Chancy, MD      . pantoprazole sodium (PROTONIX) 40 mg/20 mL oral suspension 40 mg  40 mg Oral Q24H Renold Don, MD   40 mg at 04/20/11 1758  . phenol (CHLORASEPTIC) mouth spray 1 spray  1 spray Mouth/Throat PRN Demetria Pore, MD   1 spray at 04/20/11 0949  . piperacillin-tazobactam (ZOSYN) IVPB 3.375 g  3.375 g Intravenous Q8H [name] Rumbarger, PHARMD   3.375 g at 04/21/11 0156  . psyllium (HYDROCIL/METAMUCIL) packet 1 packet  1 packet Oral Daily Shelly Flatten, MD      . simvastatin (ZOCOR) tablet 40 mg  40  mg Oral q1800 Renold Don, MD   40 mg at 04/20/11 1759  . traMADol (ULTRAM) tablet 50 mg  50 mg Oral Q6H PRN Carney Living, MD   50 mg at 04/21/11 0344  . traMADol (ULTRAM) tablet 50 mg  50 mg Oral Q6H PRN Marena Chancy, MD      . DISCONTD: acetaminophen (TYLENOL) tablet 650 mg  650 mg Per Tube Q6H PRN Coralyn Helling, MD   650 mg at 04/19/11 1805  . DISCONTD: aspirin chewable tablet 162 mg  162 mg Per Tube Daily Coralyn Helling, MD   162 mg at 04/20/11 0948  . DISCONTD: heparin ADULT infusion 100 units/ml (25000 units/250 ml)  1,700 Units/hr Intravenous Continuous [name] Rumbarger, PHARMD   1,700 Units/hr at 04/19/11 1100  . DISCONTD: levofloxacin (LEVAQUIN) tablet 500 mg  500 mg Oral Daily Priscella Mann, MD   500 mg at 04/20/11 0948  . DISCONTD: metoprolol tartrate (LOPRESSOR) 25 mg/10 mL oral suspension 25 mg  25 mg Per Tube Q12H Coralyn Helling, MD   25 mg at  04/20/11 0953  . DISCONTD: pantoprazole sodium (PROTONIX) 40 mg/20 mL oral suspension 40 mg  40 mg Per Tube Q24H Coralyn Helling, MD   40 mg at 04/19/11 2313  . DISCONTD: simvastatin (ZOCOR) tablet 40 mg  40 mg Per Tube q1800 Lonia Farber, MD   40 mg at 04/19/11 1805     PE: Gen: NAD HEENT: MMM CV: RRR, no m/r/g UJW:JXBJ Ronchi and wheezes heard throughout, normal effort. okn 6L  YNW:GNFA non-tender Ext/Musc:no edema 2+ pulses, point tenderness over L trapezius muscle, no nuchal rigidity GU: Condom cath in place Neuro: CN grossly intact  Labs/Studies:  CBG (last 3)   Basename 04/21/11 0743 04/20/11 2140 04/20/11 1653  GLUCAP 214* 238* 188*    CBC:    Component Value Date/Time   WBC 14.7* 04/21/2011 0630   HGB 10.1* 04/21/2011 0630   HCT 30.0* 04/21/2011 0630   PLT 553* 04/21/2011 0630   MCV 86.0 04/21/2011 0630     Basic Metabolic Panel:    Component Value Date/Time   NA 135 04/21/2011 0630   K 3.9 04/21/2011 0630   CL 99 04/21/2011 0630   CO2 25 04/21/2011 0630   BUN 13 04/21/2011 0630   CREATININE 0.61 04/21/2011 0630   GLUCOSE 229* 04/21/2011 0630   CALCIUM 8.9 04/21/2011 0630      Assessment/Plan:  71yo male w/ severe pneumonia and sepsis requiring 6 day stay in ICU including intubation, sent to floor yesterday.   1. Pneumonia: Extubated Saturday. Overnight hypoxemia w/ self recovery. Respiratory status improved this am after diuresis and change in ABX to Zosyn.  Good response to lasix as pt diuresed yesterday  - Decreased to Lasix 40 Qday - CXR today per cards - Continue O2 support  - incentive spirometer  - Will change back to Levaquin for a total of 8 days of ABX (Day 7) per CCM - PT/OT   2. NSTEMI: Cardiology following. Appreciated input. Secondary to demand ischemia. Heparin has been stopped.   -- Continue lopressor 25BID, ASA, Zocor  - increase Enalapirl to 10mg  Qday - f/u orders by Cardiology for CXR, CMET, Pro BNP  3. DM: Carb mod  diet. Well controlled. Consider restarting home glipizide, lantus, and metformin  - continue SSI   4. Prophylaxis:  - SCD  - Lovenox - Protonix   5. Neck pain: likely Musculoskeletal, no nuchal rigidity or AMS. Muscular point tenderness on exam.  Some improvement w/ Tramadol.  - will give Toradol 30mg  IV Q6 PRN  - Heating pad - will monitor   FEN/GI: Diarrhea improving. C.Diff negative. Likely antibiotic induced.  - Continue Imodium, metamucil, and probiotics  - KVO  - BMET in am   6. Disposition: pending improvement   Signed: Shelly Flatten, MD Family Medicine Resident PGY-1 [phone] 04/21/2011 8:21 AM

## 2011-04-21 NOTE — Progress Notes (Addendum)
Inpatient Diabetes Program Recommendations  AACE/ADA: New Consensus Statement on Inpatient Glycemic Control (2009)  Target Ranges:  Prepandial:   less than 140 mg/dL      Peak postprandial:   less than 180 mg/dL (1-2 hours)      Critically ill patients:  140 - 180 mg/dL   Results for JAYDIS, DUCHENE (MRN [id number]) as of 04/21/2011 12:30  Ref. Range 04/21/2011 07:43 04/21/2011 11:37  Glucose-Capillary Latest Range: 70-99 mg/dL 086 (H) 578 (H)    Inpatient Diabetes Program Recommendations Insulin - Basal: Please start patient's home Lantus dose- Lantus 15 units QHS.  Note: Will follow. Ambrose Finland RN, MSN, CDE Diabetes Coordinator Inpatient Diabetes Program [phone]

## 2011-04-21 NOTE — Progress Notes (Signed)
ANTICOAGULATION CONSULT NOTE - Initial Consult  Pharmacy Consult for lovenox Indication: VTE prophylaxis  No Known Allergies  Patient Measurements: Height: 5\' 9"  (175.3 cm) Weight: 145 lb (65.772 kg) IBW/kg (Calculated) : 70.7  Heparin Dosing Weight:   Vital Signs: Temp: 98.4 F (36.9 C) (02/25 0500) BP: 132/69 mmHg (02/25 1012) Pulse Rate: 81  (02/25 0939)  Labs:  Basename 04/21/11 0630 04/20/11 1150 04/20/11 0955 04/19/11 0930 04/19/11 0546  HGB 10.1* 11.5* -- -- --  HCT 30.0* 33.9* 33.4* -- --  PLT 553* 548* 515* -- --  APTT -- -- -- -- --  LABPROT -- -- -- -- --  INR -- -- -- -- --  HEPARINUNFRC -- -- 0.36 -- 0.26*  CREATININE 0.61 -- 0.65 0.45* --  CKTOTAL -- -- -- -- --  CKMB -- -- -- -- --  TROPONINI -- -- -- -- --   Estimated Creatinine Clearance: 80 ml/min (by C-G formula based on Cr of 0.61).  Medical History: Past Medical History  Diagnosis Date  . Diabetes mellitus   . Enlarged heart     Assessment: Patient is a 71 y.o M known to pharmacy from antibiotic consult.  To start lovenox for VTE prophylaxis  Goal of Therapy:  Anti-Xa level 0.3-0.6   Plan:  1) lovenox 40mg  SQ q24h 2) pharmacy will sign off on Lovenox  Reshma Hoey P 04/21/2011,12:31 PM

## 2011-04-21 NOTE — Progress Notes (Signed)
[name],PT Acute Rehabilitation [phone] (pager)  

## 2011-04-21 NOTE — Progress Notes (Signed)
Met with pt re HH needs, pt states that his neighbors are supportive and will assist him, however these neigtbors were not available when pt recently fell. This CM concerned that pt would not have appropriate support. Will continue to follow. Also aware that pt will need to wean from current O2  Flow level. Johny Shock RN MPH Case Manager  [phone]

## 2011-04-21 NOTE — H&P (Signed)
Family Medicine Teaching Service Attending Note  I discussed patient Daniel Tyler  with Dr. Gwendolyn Grant and reviewed their note for today.  I agree with their assessment and plan.

## 2011-04-21 NOTE — Progress Notes (Signed)
Physical Therapy Treatment Patient Details Name: Mearl Olver MRN: [id number] DOB: 08-Jan-1941 Today's Date: 04/21/2011  PT Assessment/Plan  PT - Assessment/Plan Comments on Treatment Session: Pt with SOB. Pt was able to walk 48 feet today on 6 L/O2. While walking with supplemental O2 his SPO2 decreased to 88%.  He needed a lot of cueing on safety, hand placement and progression of exercise while walking. Talked to pt about if he thinks he can be safe using home oxygen and educated pt on that process.  PT Plan: Discharge plan remains appropriate;Frequency remains appropriate PT Frequency: Min 3X/week Follow Up Recommendations: Home health PT Equipment Recommended: None recommended by PT PT Goals  Acute Rehab PT Goals PT Goal Formulation: With patient PT Goal: Sit to Stand - Progress: Progressing toward goal PT Goal: Stand to Sit - Progress: Progressing toward goal PT Goal: Ambulate - Progress: Progressing toward goal PT Goal: Perform Home Exercise Program - Progress: Progressing toward goal  PT Treatment Precautions/Restrictions  Precautions Precautions: Fall Restrictions Weight Bearing Restrictions: No Mobility (including Balance) Bed Mobility Bed Mobility: Yes Supine to Sit: 5: Supervision Supine to Sit Details (indicate cue type and reason): needing cueing on hand placement  Sitting - Scoot to Edge of Bed: 5: Supervision Sit to Supine: 5: Supervision Sit to Supine - Details (indicate cue type and reason): needing cueing on progression Transfers Transfers: Yes Sit to Stand: 4: Min assist;From bed Sit to Stand Details (indicate cue type and reason): needing cueing on hand placement  Stand to Sit: 5: Supervision;To bed Stand to Sit Details: needing cueing to be all the way back against the bed and reach back before sitting Ambulation/Gait Ambulation/Gait: Yes Ambulation/Gait Assistance: 5: Supervision Ambulation/Gait Assistance Details (indicate cue type and reason): pt  needing cueing to stay close to walker and on progression of exercise; pt has decreased safety awareness Ambulation Distance (Feet): 48 Feet Assistive device: Rolling walker Gait Pattern: Trunk flexed;Decreased stride length;Decreased step length - right;Decreased step length - left Stairs: No Wheelchair Mobility Wheelchair Mobility: No  Posture/Postural Control Posture/Postural Control: No significant limitations Balance Balance Assessed: No End of Session PT - End of Session Equipment Utilized During Treatment: Gait belt Activity Tolerance: Patient tolerated treatment well Patient left: in bed;with call bell in reach Nurse Communication: Mobility status for transfers;Mobility status for ambulation General Behavior During Session: [facility] for tasks performed Cognition: [facility] for tasks performed  Elvera Bicker 04/21/2011, 3:50 PM

## 2011-04-21 NOTE — Progress Notes (Signed)
Occupational Therapy Evaluation Patient Details Name: Daniel Tyler MRN: [id number] DOB: [birth year] Today's Date: 04/21/2011  Problem List: There is no problem list on file for this patient.   Past Medical History:  Past Medical History  Diagnosis Date  . Diabetes mellitus   . Enlarged heart    Past Surgical History: History reviewed. No pertinent past surgical history.  OT Assessment/Plan/Recommendation OT Assessment Clinical Impression Statement: Pt presents to OT with minimal decreased I with ADL activity.  Pt will benefit from skilled OT to increase I and return to PLOF  OT Recommendation/Assessment: Patient will need skilled OT in the acute care venue OT Problem List: Decreased strength;Decreased activity tolerance Barriers to Discharge: Decreased caregiver support OT Plan OT Treatment/Interventions: Self-care/ADL training;DME and/or AE instruction;Patient/family education OT Recommendation Follow Up Recommendations: Home health OT Individuals Consulted Consulted and Agree with Results and Recommendations: Patient     OT Evaluation Precautions/Restrictions  Precautions Precautions: Fall Restrictions Weight Bearing Restrictions: No Prior Functioning Home Living Lives With: Alone Receives Help From: Family Type of Home: Mobile home Home Layout: One level Home Access: Stairs to enter Entrance Stairs-Rails: Left Entrance Stairs-Number of Steps: 4-5 Bathroom Shower/Tub: Engineer, manufacturing systems: Standard Bathroom Accessibility: Yes How Accessible: Accessible via walker Home Adaptive Equipment: Walker - rolling Prior Function Level of Independence: Independent with basic ADLs;Independent with transfers;Independent with homemaking with ambulation;Independent with gait Driving: Yes Vocation: Retired ADL ADL Grooming: Performed;Supervision/safety Where Assessed - Grooming: Other (comment) (standing EOB) Where Assessed - Upper Body Dressing: Sitting,  bed;Supported Lower Body Dressing: Supervision/safety Where Assessed - Lower Body Dressing: Sitting, bed;Unsupported Toilet Transfer: Minimal assistance Toilet Transfer Equipment: Raised toilet seat with arms (or 3-in-1 over toilet) Toileting - Clothing Manipulation: Supervision/safety;Performed Where Assessed - Toileting Clothing Manipulation: Standing Where Assessed - Toileting Hygiene: Standing Tub/Shower Transfer: Simulated;Supervision/safety Vision/Perception  Vision - History Baseline Vision: No visual deficits Cognition Cognition Arousal/Alertness: Awake/alert Overall Cognitive Status: Appears within functional limits for tasks assessed Orientation Level: Oriented X4    Mobility  Bed Mobility Bed Mobility: Yes Supine to Sit: 5: Supervision Sitting - Scoot to Edge of Bed: 5: Supervision Sit to Supine: 5: Supervision Transfers Sit to Stand: 4: Min assist;From toilet;From bed Stand to Sit: 5: Supervision;To bed;To toilet    End of Session OT - End of Session Activity Tolerance: Patient tolerated treatment well Patient left: in bed General Behavior During Session: [facility] for tasks performed Cognition: [facility] for tasks performed   Lalana Wachter, [place] Kung 04/21/2011, 3:18 PM

## 2011-04-21 NOTE — Progress Notes (Addendum)
Pt. Oxygen saturation per continuous pulse ox on finger reading 78-82% sustained on 6 L O2.  Pt. in no acute distress-denies difficulty breathing or shortness of breath, pt sitting up talking without difficulty.  Respiratory rate 22 and unlabored.  Rhonchi heard bilaterally.   O2 sat probe placed on forehead with oxygen saturation reading 86-88% sustained on 6 L O2.    PRN breathing treatment given and patient O2 sat increased to 100% on 6 L O2 via [place].  Will continue to monitor patient.

## 2011-04-21 NOTE — Progress Notes (Signed)
Subjective:  C/o pain in left neck and post back for 2 days, moderate oxygen desat ovenight.  No chest pain, No PND.  Objective:  Vital Signs in the last 24 hours: BP 111/64  Pulse 81  Temp(Src) 98.4 F (36.9 C) (Oral)  Resp 18  Ht 5\' 9"  (1.753 m)  Wt 65.772 kg (145 lb)  BMI 21.41 kg/m2  SpO2 100%  Physical Exam: Elderly WM alert and in NAD Lungs:  Decreased BS bilat with rales and wheezing and rhonchi bilat Cardiac:  Regular rhythm, normal S1 and S2, no S3, 1-2 6 systolic murmur Abdomen: negative Extremities:  No edema present  Intake/Output from previous day: 02/24 0701 - 02/25 0700 In: 1000 [P.O.:780; I.V.:120; IV Piggyback:100] Out: 3400 [Urine:3400]  Lab Results: Basic Metabolic Panel:  Basename 04/21/11 0630 04/20/11 0955  NA 135 134*  K 3.9 5.0  CL 99 100  CO2 25 21  GLUCOSE 229* 312*  BUN 13 12  CREATININE 0.61 0.65   CBC:  Basename 04/21/11 0630 04/20/11 1150  WBC 14.7* 18.3*  NEUTROABS -- --  HGB 10.1* 11.5*  HCT 30.0* 33.9*  MCV 86.0 86.7  PLT 553* 548*   BNP    Component Value Date/Time   PROBNP 1087.0* 04/16/2011 1031    Telemetry: Sinus rhythm No sig arrhythmias    Assessment/Plan:  1.Bilateral pneumonia and sepsis 2. NonSTEMI with decreased LV and mild CHF 3. anemia  REC:  He has continued oxygen desats at night and during day.  May need to get pulmonary reinvolved. Suspect most of this is pulmonary rather than cardiac.  Heparin was d/c due to anemia.  Titrate ACE up.  Check EKG and repeat CXR.   l W. Ashley Royalty.  MD [facility] 04/21/2011, 9:18 AM

## 2011-04-21 NOTE — Progress Notes (Signed)
I have seen and examined this patient. I have discussed with Dr Konrad Dolores.  I agree with their findings and plans as documented in their progress note for today.  Acute Issues  Pneumonia - intermittent hypoxemia at rest.  Suspect mucus plugging.  - changing to levaquin  Check pulse Ox with ambulation (ordered).  May need home Oxygen at discharge.  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  Would try trial of Spriva (ordered) with rescue beta agonist(ordered). -Plan to send Spiriva and Albuterol MDIs home with patient   2. NSTEMI Stable. On metoprolol and Aspirin.  Monitor BP.   If tolerates ACEI and beta blocker, consider addition of aldosterone antagonist.     3. Left Ventricular Systolic Failure, ischemic cardiomyopathy.  Started on ACEI. Lasix decreased to 20 mg IV daily. Monitor BP, Cr and K+. CXR in morning

## 2011-04-22 ENCOUNTER — Other Ambulatory Visit: Payer: Self-pay

## 2011-04-22 ENCOUNTER — Inpatient Hospital Stay (HOSPITAL_COMMUNITY): Payer: Medicare Other

## 2011-04-22 LAB — CBC
HCT: 30.4 % — ABNORMAL LOW (ref 39.0–52.0)
Hemoglobin: 10 g/dL — ABNORMAL LOW (ref 13.0–17.0)
MCHC: 32.9 g/dL (ref 30.0–36.0)

## 2011-04-22 LAB — COMPREHENSIVE METABOLIC PANEL
ALT: 20 U/L (ref 0–53)
Calcium: 9.2 mg/dL (ref 8.4–10.5)
GFR calc Af Amer: >90 mL/min (ref >90)
Glucose, Bld: 196 mg/dL — ABNORMAL HIGH (ref 70–99)
Sodium: 132 mEq/L — ABNORMAL LOW (ref 135–145)
Total Protein: 6.9 g/dL (ref 6.0–8.3)

## 2011-04-22 LAB — URINALYSIS, ROUTINE W REFLEX MICROSCOPIC
Bilirubin Urine: NEGATIVE
Glucose, UA: 500 mg/dL — AB
Hgb urine dipstick: NEGATIVE
Protein, ur: NEGATIVE mg/dL
Specific Gravity, Urine: 1.01 (ref 1.005–1.030)

## 2011-04-22 LAB — GLUCOSE, CAPILLARY
Glucose-Capillary: 181 mg/dL — ABNORMAL HIGH (ref 70–99)
Glucose-Capillary: 265 mg/dL — ABNORMAL HIGH (ref 70–99)
Glucose-Capillary: 316 mg/dL — ABNORMAL HIGH (ref 70–99)

## 2011-04-22 LAB — PROTEIN, URINE, RANDOM: Total Protein, Urine: 5.8 mg/dL

## 2011-04-22 MED ORDER — PSYLLIUM 95 % PO PACK
1.0000 | PACK | ORAL | Status: DC | PRN
  Filled 2011-04-22: qty 1

## 2011-04-22 MED ORDER — INSULIN GLARGINE 100 UNIT/ML ~~LOC~~ SOLN
15.0000 [IU] | Freq: Every day | SUBCUTANEOUS | Status: DC
  Administered 2011-04-22 – 2011-04-25 (×4): 15 [IU] via SUBCUTANEOUS
  Filled 2011-04-22: qty 3

## 2011-04-22 MED ORDER — FUROSEMIDE 20 MG PO TABS
20.0000 mg | ORAL_TABLET | Freq: Every day | ORAL | Status: DC
  Administered 2011-04-23 – 2011-04-26 (×4): 20 mg via ORAL
  Filled 2011-04-22 (×4): qty 1

## 2011-04-22 MED ORDER — GLUCERNA SHAKE PO LIQD
237.0000 mL | Freq: Two times a day (BID) | ORAL | Status: DC
  Administered 2011-04-22 – 2011-04-26 (×3): 237 mL via ORAL

## 2011-04-22 NOTE — Progress Notes (Signed)
Nutrition Follow-up  Diet Order:  Carb Modified:  Medium 1600-2000 kcal  Patient was extubated on 2/22. Has had some intermittent hypoxemia during rest (2/24 and 2/25) per MD note.  2/25: Was seen by DM coordinator r/lt hyperglycemia (CBG's were 214 mg/dL and 161 mg/dL).   Respiratory status is improving with continued diuresis and ABX. Plan to wean off oxygen.  Diarrhea is being treated with imodium and probiotics; most likely antibiotic-induced per MD note 2/26. Hyponatermia r/t to diuresis  PO intake is 70-100% per doc flowsheets. Patient reported eating about 50%, and sending the remaining food back. He has not enjoyed the food, and stated only being able to tolerate the applesauce and pudding. Patient may benefit from Glucerna BID to ensure adequate protein intake  Meds: Scheduled Meds:   . acidophilus  1 capsule Oral Daily  . antiseptic oral rinse  15 mL Mouth Rinse BID  . aspirin  162 mg Oral Daily  . enalapril  10 mg Oral Daily  . enoxaparin (LOVENOX) injection  40 mg Subcutaneous Q24H  . furosemide  20 mg Intravenous Daily  . guaiFENesin  1,200 mg Oral BID  . insulin aspart  0-15 Units Subcutaneous TID WC  . insulin glargine  15 Units Subcutaneous QHS  . levofloxacin  500 mg Oral Daily  . loperamide  2 mg Oral BID  . metoprolol tartrate  25 mg Oral Q12H  . pantoprazole sodium  40 mg Oral Q24H  . simvastatin  40 mg Oral q1800  . tiotropium  18 mcg Inhalation Daily  . DISCONTD: antiseptic oral rinse  15 mL Mouth Rinse QID  . DISCONTD: chlorhexidine  15 mL Mouth Rinse BID  . DISCONTD: furosemide  20 mg Intravenous Daily  . DISCONTD: furosemide  40 mg Intravenous Daily  . DISCONTD: guaiFENesin  600 mg Oral BID  . DISCONTD: piperacillin-tazobactam (ZOSYN)  IV  3.375 g Intravenous Q8H  . DISCONTD: psyllium  1 packet Oral Daily   Continuous Infusions:   . sodium chloride Stopped (04/20/11 1029)   PRN Meds:.acetaminophen, albuterol, fentaNYL, ketorolac, Muscle Rub, phenol,  psyllium, traMADol, traMADol, DISCONTD: albuterol  Labs:  CMP     Component Value Date/Time   NA 132* 04/22/2011 0549   K 4.6 04/22/2011 0549   CL 95* 04/22/2011 0549   CO2 28 04/22/2011 0549   GLUCOSE 196* 04/22/2011 0549   BUN 14 04/22/2011 0549   CREATININE 0.73 04/22/2011 0549   CALCIUM 9.2 04/22/2011 0549   PROT 6.9 04/22/2011 0549   ALBUMIN 2.2* 04/22/2011 0549   AST 20 04/22/2011 0549   ALT 20 04/22/2011 0549   ALKPHOS 58 04/22/2011 0549   BILITOT 0.2* 04/22/2011 0549   GFRNONAA >90 04/22/2011 0549   GFRAA >90 04/22/2011 0549   CBG (last 3)   Basename 04/22/11 0751 04/21/11 2101 04/21/11 1634  GLUCAP 181* 164* 302*     Intake/Output Summary (Last 24 hours) at 04/22/11 0946 Last data filed at 04/22/11 0900  Gross per 24 hour  Intake    840 ml  Output   1850 ml  Net  -1010 ml    Weight Status:  Wt is 147 lb (67 kg). Continues to trend up, and has increased 5 lbs since previous RD note on 2/21.  Re-estimated needs:1720-1920 kcal, 77-87 gm protein    Nutrition Dx:  Inadequate oral intake, improved  Goal:  TF to meet 90-100% of estimated needs, not applicable  New Goal: Consume >/= 90% of estimated needs, unmet  Intervention:  Supplement with Glucerna BID to provide additional 440 kcal, 20 gram protein, 400 ml water  RD to follow nutrition care plan  Monitor:  Weights, PO intake, labs, supplement tolerance  Alger Memos Pager #: [phone]  Lloyd Huger Pager #:  [phone]

## 2011-04-22 NOTE — Progress Notes (Signed)
Family Medicine Teaching Service [facility] Progress Note  Patient name: Daniel Tyler Medical record number: [id number] Date of birth: 03-26-1940 Age: 71 y.o. Gender: male    LOS: 8 days   Primary Care Provider: Dois Davenport., MD, MD  Overnight Events:  NAEO   Objective: Vital signs in last 24 hours: Temp:  [96.9 F (36.1 C)-99.3 F (37.4 C)] 99.3 F (37.4 C) (02/26 0500) Pulse Rate:  [70-83] 79  (02/26 0500) Resp:  [16-18] 18  (02/26 0500) BP: (91-132)/(57-69) 132/62 mmHg (02/26 0500) SpO2:  [99 %-100 %] 100 % (02/26 0500) Weight:  [147 lb 11.2 oz (66.996 kg)] 147 lb 11.2 oz (66.996 kg) (02/26 0500)  Wt Readings from Last 3 Encounters:  04/22/11 147 lb 11.2 oz (66.996 kg)     Current Facility-Administered Medications  Medication Dose Route Frequency Provider Last Rate Last Dose  . 0.9 %  sodium chloride infusion   Intravenous Continuous Coralyn Helling, MD   20 mL/hr at 04/15/11 0930  . acetaminophen (TYLENOL) tablet 650 mg  650 mg Oral Q6H PRN Renold Don, MD      . acidophilus (RISAQUAD) capsule 1 capsule  1 capsule Oral Daily Shelly Flatten, MD   1 capsule at 04/21/11 0940  . albuterol (PROVENTIL HFA;VENTOLIN HFA) 108 (90 BASE) MCG/ACT inhaler 2 puff  2 puff Inhalation Q6H PRN Leighton Roach McDiarmid, MD      . antiseptic oral rinse (BIOTENE) solution 15 mL  15 mL Mouth Rinse BID Carney Living, MD   15 mL at 04/21/11 2200  . aspirin chewable tablet 162 mg  162 mg Oral Daily Renold Don, MD   162 mg at 04/21/11 1003  . enalapril (VASOTEC) tablet 10 mg  10 mg Oral Daily Shelly Flatten, MD   10 mg at 04/21/11 1033  . enoxaparin (LOVENOX) injection 40 mg  40 mg Subcutaneous Q24H Anh P Pham, PHARMD   40 mg at 04/21/11 1315  . fentaNYL (SUBLIMAZE) injection 25-100 mcg  25-100 mcg Intravenous Q2H PRN Billy Fischer, MD   100 mcg at 04/17/11 1937  . furosemide (LASIX) injection 20 mg  20 mg Intravenous Daily Leighton Roach McDiarmid, MD   20 mg at 04/21/11 1015  . guaiFENesin (MUCINEX) 12 hr  tablet 1,200 mg  1,200 mg Oral BID Leighton Roach McDiarmid, MD   1,200 mg at 04/21/11 2157  . insulin aspart (novoLOG) injection 0-15 Units  0-15 Units Subcutaneous TID WC Marena Chancy, MD   3 Units at 04/22/11 [phone]  . ketorolac (TORADOL) 30 MG/ML injection 30 mg  30 mg Intravenous Q6H PRN Shelly Flatten, MD   30 mg at 04/21/11 1251  . levofloxacin (LEVAQUIN) tablet 500 mg  500 mg Oral Daily Shelly Flatten, MD   500 mg at 04/21/11 1316  . loperamide (IMODIUM) capsule 2 mg  2 mg Oral BID Shelly Flatten, MD   2 mg at 04/21/11 2157  . metoprolol tartrate (LOPRESSOR) 25 mg/10 mL oral suspension 25 mg  25 mg Oral Q12H Renold Don, MD   25 mg at 04/21/11 2157  . Muscle Rub CREA   Topical PRN Marena Chancy, MD      . pantoprazole sodium (PROTONIX) 40 mg/20 mL oral suspension 40 mg  40 mg Oral Q24H Renold Don, MD   40 mg at 04/21/11 1703  . phenol (CHLORASEPTIC) mouth spray 1 spray  1 spray Mouth/Throat PRN Demetria Pore, MD   1 spray at 04/20/11 0949  . psyllium (HYDROCIL/METAMUCIL) packet 1 packet  1 packet Oral  Daily Shelly Flatten, MD      . simvastatin (ZOCOR) tablet 40 mg  40 mg Oral q1800 Renold Don, MD   40 mg at 04/21/11 1703  . tiotropium (SPIRIVA) inhalation capsule 18 mcg  18 mcg Inhalation Daily Todd D McDiarmid, MD      . traMADol Janean Sark) tablet 50 mg  50 mg Oral Q6H PRN Carney Living, MD   50 mg at 04/22/11 0129  . traMADol (ULTRAM) tablet 50 mg  50 mg Oral Q6H PRN Marena Chancy, MD      . DISCONTD: albuterol (PROVENTIL) (5 MG/ML) 0.5% nebulizer solution 2.5 mg  2.5 mg Nebulization Q2H PRN Clementeen Graham, MD   2.5 mg at 04/21/11 0203  . DISCONTD: antiseptic oral rinse (BIOTENE) solution 15 mL  15 mL Mouth Rinse QID Leslye Peer, MD   15 mL at 04/21/11 1159  . DISCONTD: chlorhexidine (PERIDEX) 0.12 % solution 15 mL  15 mL Mouth Rinse BID Leslye Peer, MD   15 mL at 04/21/11 0844  . DISCONTD: enalapril (VASOTEC) tablet 5 mg  5 mg Oral Daily Priscella Mann, MD   5 mg at 04/20/11 0947  .  DISCONTD: furosemide (LASIX) injection 20 mg  20 mg Intravenous Daily Shelly Flatten, MD      . DISCONTD: furosemide (LASIX) injection 40 mg  40 mg Intravenous Daily Renold Don, MD   40 mg at 04/20/11 1759  . DISCONTD: guaiFENesin (MUCINEX) 12 hr tablet 600 mg  600 mg Oral BID Dessa Phi, MD   600 mg at 04/21/11 0940  . DISCONTD: piperacillin-tazobactam (ZOSYN) IVPB 3.375 g  3.375 g Intravenous Q8H [name] Rumbarger, PHARMD   3.375 g at 04/21/11 1610     PE: Gen: NAD  HEENT: MMM  CV: RRR, no m/r/g  RUE:AVWU Ronchi heard throughout, breath sounds improved. normal effort. on 6L [place]  JWJ:XBJY non-tender  Ext/Musc:no edema 2+ pulses, point tenderness over L trapezius muscle, no nuchal rigidity  GU: Condom cath in place  Neuro: CN grossly intact   Labs/Studies:  Comprehensive Metabolic Panel:    Component Value Date/Time   NA 132* 04/22/2011 0549   K 4.6 04/22/2011 0549   CL 95* 04/22/2011 0549   CO2 28 04/22/2011 0549   BUN 14 04/22/2011 0549   CREATININE 0.73 04/22/2011 0549   GLUCOSE 196* 04/22/2011 0549   CALCIUM 9.2 04/22/2011 0549   AST 20 04/22/2011 0549   ALT 20 04/22/2011 0549   ALKPHOS 58 04/22/2011 0549   BILITOT 0.2* 04/22/2011 0549   PROT 6.9 04/22/2011 0549   ALBUMIN 2.2* 04/22/2011 0549    CBG (last 3)   Basename 04/22/11 0751 04/21/11 2101 04/21/11 1634  GLUCAP 181* 164* 302*   CBC:    Component Value Date/Time   WBC 14.4* 04/22/2011 0549   HGB 10.0* 04/22/2011 0549   HCT 30.4* 04/22/2011 0549   PLT 577* 04/22/2011 0549   MCV 86.9 04/22/2011 0549           Assessment/Plan: 71yo male w/ severe pneumonia and sepsis requiring 6 day stay in ICU including intubation, sent to floor yesterday.   1. Pneumonia: Extubated Saturday. No overnight hypoxemia which is an improvement from the prior two nights. Respiratory status improved this am after continued diuresis and ABX. Net output of 1.1L yesterday (2.6L since admission).  WBC improving  - Continue Lasix 20  Qday  - F/u output - Wean O2 as tolerated   - continue incentive spirometer  - Continue  Levaquin. Will discuss length of treatment w/ care team. Today is day 9 of ABX - will start flutter valve and mask per Resp Rec.   2. NSTEMI: No CP complaints. No events since transfer to telemetry. Cardiology following. Appreciated input. Secondary to demand ischemia. Heparin has been stopped.  - Continue lopressor 25BID, ASA, Zocor  - increase Enalapirl to 10mg  Qday  -    3. DM: Carb mod diet. Well controlled. Consider restarting home glipizide, lantus, and metformin  - continue SSI  - Restart home Lantus 15 QHS  4. Prophylaxis:  - SCD  - Lovenox  - Protonix   5. Musculoskeletal: Continues to complain of neck/shoulder pain. Pain localized in his trapezius, no nuchal rigidity or AMS. Some improvement w/ Toradol, heat/ice.   - PT/OT Will evaluate for home needs and shoulder pain - Continue Toradol 30mg  IV Q6 PRN  - Heating pad / Ice pack PRN - CXR today, will ask Rads to evaluate for any shoulder abnormality. No need for additional films unless something comes back unusual.   6. FEN/GI: Diarrhea improving. C.Diff negative. Likely antibiotic induced. Hyponatremia likely from diuresis. If worsens tomorrow will need to DC lasix. Low Albumin noted since admission. Malnutrition vs urinary loss.  - Continue Imodium, and probiotics  - KVO  - Urine protein level - f/u Nutrition recs - BMET in am    7. Social: Pt wanting to go home and states his neighbor can help take care of him. Informed of need to have more support at home. Case mgt and CSW following. OT/PT following and also recommending home nursing/PT needs. - Continue CSW, Case Mgt - PT/OT  7. Disposition: pending improvement     DM restart home lantus 15 Needs HH Wean O2 as tolerated     Signed: Shelly Flatten, MD Family Medicine Resident PGY-1 [phone] 04/22/2011 8:37 AM

## 2011-04-22 NOTE — Progress Notes (Signed)
Physical Therapy Treatment Patient Details Name: Daniel Tyler MRN: [id number] DOB: [birth year] Today's Date: 04/22/2011  PT Assessment/Plan  PT - Assessment/Plan Comments on Treatment Session: PT with SOB. Pt reports pain in L shoulder but pain is not affecting his ROM or strength in his L UE. Pt reports that he cold packs are helping with that pain. While walking today with 5 L/O2 pt stats went down to 84% but pt was not SOB; stats increased back to 90% with one minute of rest. While walking pt needed a lot of cueing to stay close to walker and about the progression of exercise. Talked with pt about his decreased endurance and his need to be on O2 at home. Since pt has 4 steps to enter home, lives alone, has decreased safety awareness and has decreased endurance/strength PT is updating D/C plan and is recommeneds pt go to a SNF for follow up therapy to work on endurance and safety when going home; pt is open to the idea. Adding pt to mobility team, see shadow chart for those notes.  PT Plan: Discharge plan needs to be updated;Frequency remains appropriate PT Frequency: Min 3X/week Follow Up Recommendations: Skilled nursing facility Equipment Recommended: Defer to next venue PT Goals  Acute Rehab PT Goals PT Goal: Sit to Stand - Progress: Progressing toward goal PT Goal: Stand to Sit - Progress: Progressing toward goal PT Goal: Ambulate - Progress: Progressing toward goal  PT Treatment Precautions/Restrictions  Precautions Precautions: Fall Restrictions Weight Bearing Restrictions: No Mobility (including Balance) Bed Mobility Bed Mobility: Yes Supine to Sit: 5: Supervision Supine to Sit Details (indicate cue type and reason): needing cueing on hand placement and extended amount of time Sitting - Scoot to Edge of Bed: 5: Supervision Sit to Supine: 5: Supervision Transfers Transfers: Yes Sit to Stand: 4: Min assist;From bed Sit to Stand Details (indicate cue type and reason): needing  cueing to stand using hand to push self off bed then grab onto walker Stand to Sit: 5: Supervision Stand to Sit Details: needing cueing on hand placement Ambulation/Gait Ambulation/Gait: Yes Ambulation/Gait Assistance: 5: Supervision Ambulation/Gait Assistance Details (indicate cue type and reason): pt stands back far from walker and is unwilling to step closer to walker but did not have any LOB while walking; pt needs cueing about progression of exercise  Ambulation Distance (Feet): 150 Feet Assistive device: Rolling walker Gait Pattern: Trunk flexed;Decreased stride length;Decreased step length - right;Decreased step length - left Stairs: No Wheelchair Mobility Wheelchair Mobility: No  Posture/Postural Control Posture/Postural Control: No significant limitations Balance Balance Assessed: No End of Session PT - End of Session Equipment Utilized During Treatment: Gait belt Activity Tolerance: Patient tolerated treatment well Patient left: in bed;with call bell in reach Nurse Communication: Mobility status for transfers;Mobility status for ambulation General Behavior During Session: [facility] for tasks performed Cognition: [facility] for tasks performed  Elvera Bicker 04/22/2011, 1:06 PM

## 2011-04-22 NOTE — Progress Notes (Signed)
Daniel Tyler,PT Acute Rehabilitation [phone] (pager)  

## 2011-04-22 NOTE — Progress Notes (Signed)
Subjective:  Mild shoulder pain at present.  Mild SOB with walking.  Breathing better  Objective:  Vital Signs in the last 24 hours: BP 132/62  Pulse 79  Temp(Src) 99.3 F (37.4 C) (Oral)  Resp 18  Ht 5\' 9"  (1.753 m)  Wt 66.996 kg (147 lb 11.2 oz)  BMI 21.81 kg/m2  SpO2 88%  Physical Exam: Elderly WM alert and in NAD Lungs:  Decreased BS bilat with rales and wheezing and rhonchi bilat Cardiac:  Regular rhythm, normal S1 and S2, no S3, 1-2 6 systolic murmur Abdomen: negative Extremities:  No edema present  Intake/Output from previous day: 02/25 0701 - 02/26 0700 In: 840 [P.O.:720; I.V.:120] Out: 750 [Urine:750]  Lab Results: Basic Metabolic Panel:  Basename 04/22/11 0549 04/21/11 0630  NA 132* 135  K 4.6 3.9  CL 95* 99  CO2 28 25  GLUCOSE 196* 229*  BUN 14 13  CREATININE 0.73 0.61   CBC:  Basename 04/22/11 0549 04/21/11 0630  WBC 14.4* 14.7*  NEUTROABS -- --  HGB 10.0* 10.1*  HCT 30.4* 30.0*  MCV 86.9 86.0  PLT 577* 553*   BNP    Component Value Date/Time   PROBNP 1087.0* 04/16/2011 1031    Telemetry: Sinus rhythm No sig arrhythmias    Assessment/Plan:  1.Bilateral pneumonia and sepsis 2. NonSTEMI with decreased LV and mild CHF 3. anemia  REC:  Oxygenation is better.  More alert and sounds active.  With reduced LV function and diabetes will need assessment of coronary status prior to d/c.  He is not ready to go out yet but once over pneumonia would like to consider coronary angios prior to d/c ? Later in week.   Darden Palmer.  MD [facility] 04/22/2011, 1:43 PM

## 2011-04-22 NOTE — Progress Notes (Signed)
I have seen and examined this patient. I have discussed with Dr Konrad Dolores.  I agree with their findings and plans as documented in their progress note for today.  Acute Issues 1. Pneumonia, community acquired  - completed Antibiotic therapy.   2. Questionable curvilinear density versus superimposed artifact lower  left chest - Will need 4-6 week follow up CXR to ensure density resolves  3. NSTEMI - Stable. Dr Donnie Aho planning coronary catheterization towards end of week.  4.  COPD, emphysematous - Ambulation SaO2 to 84%, returned to 90% at rest with O2 supplement. - Tolerating Spiriva - Will plan on arranging home O2 at discharge.    5. Left Ventricular systolic failure, ischemic cardiomyopathy Tolerating ACEI Change to PO Lasix  6.  Disposition - Daniel Tyler is declining SNF.  He is planning to return home after this hospitalization.  - Patient is oriented to person, place and time.  He has insight into his illness and condition. He believes he will have adequate support from neighbors and friends to support his return home. He understands that he is at risk of falling without having immediate assistance at home.   - Daniel Tyler appears to have capacity to make medical decisions.

## 2011-04-22 NOTE — Progress Notes (Signed)
Clinical Child psychotherapist (CSW) completed psychosocial assessment which can be found in pt shadow chart. CSW informed that pt will no longer be going home with [facility] as he needs skilled nursing facility placement. CSW spoke with pt who states he is agreeable to placement near his home. CSW will fax pt information to facilities in [facility] in pt shadow chart to be signed by MD.  Theresia Bough, MSW, Theresia Majors [phone]

## 2011-04-23 LAB — BASIC METABOLIC PANEL
BUN: 13 mg/dL (ref 6–23)
CO2: 25 mEq/L (ref 19–32)
Chloride: 99 mEq/L (ref 96–112)
Creatinine, Ser: 0.65 mg/dL (ref 0.50–1.35)
Glucose, Bld: 187 mg/dL — ABNORMAL HIGH (ref 70–99)
Potassium: 3.8 mEq/L (ref 3.5–5.1)

## 2011-04-23 LAB — GLUCOSE, CAPILLARY
Glucose-Capillary: 165 mg/dL — ABNORMAL HIGH (ref 70–99)
Glucose-Capillary: 271 mg/dL — ABNORMAL HIGH (ref 70–99)
Glucose-Capillary: 197 mg/dL — ABNORMAL HIGH (ref 70–99)

## 2011-04-23 MED ORDER — ASPIRIN 81 MG PO CHEW
162.0000 mg | CHEWABLE_TABLET | Freq: Every day | ORAL | Status: DC
  Administered 2011-04-25: 162 mg via ORAL
  Filled 2011-04-23: qty 2

## 2011-04-23 MED ORDER — ASPIRIN 81 MG PO CHEW
324.0000 mg | CHEWABLE_TABLET | ORAL | Status: AC
  Administered 2011-04-24: 324 mg via ORAL
  Filled 2011-04-23: qty 4

## 2011-04-23 MED ORDER — SODIUM CHLORIDE 0.9 % IV SOLN
INTRAVENOUS | Status: DC
  Administered 2011-04-24: 01:00:00 via INTRAVENOUS

## 2011-04-23 MED ORDER — DIAZEPAM 5 MG PO TABS
5.0000 mg | ORAL_TABLET | ORAL | Status: AC
  Administered 2011-04-24: 5 mg via ORAL
  Filled 2011-04-23: qty 1

## 2011-04-23 NOTE — Progress Notes (Signed)
Subjective:  Had recurrent shoulder pain relieved with pain meds.  EKG shows poor R wave progression.  Not SOB, O2 sats are better. Sleepy at present  Objective:  Vital Signs in the last 24 hours: BP 103/67  Pulse 87  Temp(Src) 97.9 F (36.6 C) (Oral)  Resp 18  Ht 5\' 9"  (1.753 m)  Wt 65.091 kg (143 lb 8 oz)  BMI 21.19 kg/m2  SpO2 92%  Physical Exam: Elderly WM lethargic Lungs:  Decreased BS bilat  Cardiac:  Regular rhythm, normal S1 and S2, no S3, 1-2 6 systolic murmur Abdomen: negative Extremities:  No edema present  Intake/Output from previous day: 02/26 0701 - 02/27 0700 In: 840 [P.O.:720; I.V.:120] Out: 3750 [Urine:3750]  Lab Results: Basic Metabolic Panel:  Basename 04/23/11 0555 04/22/11 0549  NA 136 132*  K 3.8 4.6  CL 99 95*  CO2 25 28  GLUCOSE 187* 196*  BUN 13 14  CREATININE 0.65 0.73   CBC:  Basename 04/22/11 0549 04/21/11 0630  WBC 14.4* 14.7*  NEUTROABS -- --  HGB 10.0* 10.1*  HCT 30.4* 30.0*  MCV 86.9 86.0  PLT 577* 553*   BNP    Component Value Date/Time   PROBNP 1087.0* 04/16/2011 1031    Telemetry: Sinus rhythm No sig arrhythmias    Assessment/Plan:  1.Bilateral pneumonia and sepsis better 2. NonSTEMI with decreased LV and mild CHF 3. anemia  REC:  Plan cardiac cath tomorrow if stable overnight.  Unclear if shoulder pain is an ischemic equivalent.  Cardiac catheterization was discussed with the patient fully including risks of myocardial infarction, death, stroke, bleeding, arrhythmia, dye allergy, renal insufficiency or bleeding.  The patient understands and is willing to proceed.    Darden Palmer.  MD [facility] 04/23/2011, 1:40 PM

## 2011-04-23 NOTE — Progress Notes (Signed)
I discussed with Dr Merrell.  I agree with their plans documented in their  Note for today.  

## 2011-04-23 NOTE — Progress Notes (Signed)
Family Medicine Teaching Service [facility] Progress Note  Patient name: Daniel Tyler Medical record number: [id number] Date of birth: 08/14/1940 Age: 71 y.o. Gender: male    LOS: 9 days   Primary Care Provider: Dois Davenport., MD, MD  Overnight Events:  NAEO. No Desats. BM normal. Feeling much better today. Feeling ready to go home. Shoulder/neck pain totally relieved w/ ice packs.   Objective: Vital signs in last 24 hours: Temp:  [97.9 F (36.6 C)-98.9 F (37.2 C)] 97.9 F (36.6 C) (02/27 0500) Pulse Rate:  [80-83] 83  (02/27 0500) Resp:  [18] 18  (02/27 0500) BP: (91-128)/(56-73) 123/73 mmHg (02/27 0500) SpO2:  [88 %-96 %] 92 % (02/27 0500) Weight:  [143 lb 8 oz (65.091 kg)] 143 lb 8 oz (65.091 kg) (02/27 0500)  Wt Readings from Last 3 Encounters:  04/23/11 143 lb 8 oz (65.091 kg)     Current Facility-Administered Medications  Medication Dose Route Frequency Provider Last Rate Last Dose  . 0.9 %  sodium chloride infusion   Intravenous Continuous Coralyn Helling, MD   20 mL/hr at 04/15/11 0930  . acetaminophen (TYLENOL) tablet 650 mg  650 mg Oral Q6H PRN Renold Don, MD   650 mg at 04/22/11 1714  . acidophilus (RISAQUAD) capsule 1 capsule  1 capsule Oral Daily Shelly Flatten, MD   1 capsule at 04/22/11 0957  . albuterol (PROVENTIL HFA;VENTOLIN HFA) 108 (90 BASE) MCG/ACT inhaler 2 puff  2 puff Inhalation Q6H PRN Leighton Roach McDiarmid, MD      . antiseptic oral rinse (BIOTENE) solution 15 mL  15 mL Mouth Rinse BID Carney Living, MD   15 mL at 04/22/11 2059  . aspirin chewable tablet 162 mg  162 mg Oral Daily Renold Don, MD   162 mg at 04/22/11 1042  . enalapril (VASOTEC) tablet 10 mg  10 mg Oral Daily Shelly Flatten, MD   10 mg at 04/22/11 0958  . enoxaparin (LOVENOX) injection 40 mg  40 mg Subcutaneous Q24H Anh P Pham, PHARMD   40 mg at 04/22/11 1401  . feeding supplement (GLUCERNA SHAKE) liquid 237 mL  237 mL Oral BID BM Waylan Boga [place], RD   237 mL at 04/22/11 1500    . fentaNYL (SUBLIMAZE) injection 25-100 mcg  25-100 mcg Intravenous Q2H PRN Billy Fischer, MD   100 mcg at 04/17/11 1308  . furosemide (LASIX) tablet 20 mg  20 mg Oral Daily Shelly Flatten, MD      . guaiFENesin [place]) 12 hr tablet 1,200 mg  1,200 mg Oral BID Leighton Roach McDiarmid, MD   1,200 mg at 04/22/11 2105  . insulin aspart (novoLOG) injection 0-15 Units  0-15 Units Subcutaneous TID WC Marena Chancy, MD   11 Units at 04/22/11 1714  . insulin glargine (LANTUS) injection 15 Units  15 Units Subcutaneous QHS Shelly Flatten, MD   15 Units at 04/22/11 2105  . ketorolac (TORADOL) 30 MG/ML injection 30 mg  30 mg Intravenous Q6H PRN Shelly Flatten, MD   30 mg at 04/22/11 1330  . levofloxacin (LEVAQUIN) tablet 500 mg  500 mg Oral Daily Shelly Flatten, MD   500 mg at 04/22/11 0957  . loperamide (IMODIUM) capsule 2 mg  2 mg Oral BID Shelly Flatten, MD   2 mg at 04/22/11 2105  . metoprolol tartrate (LOPRESSOR) 25 mg/10 mL oral suspension 25 mg  25 mg Oral Q12H Renold Don, MD   25 mg at 04/22/11 0958  . Muscle Rub CREA   Topical  PRN Marena Chancy, MD      . pantoprazole sodium (PROTONIX) 40 mg/20 mL oral suspension 40 mg  40 mg Oral Q24H Renold Don, MD   40 mg at 04/22/11 1714  . phenol (CHLORASEPTIC) mouth spray 1 spray  1 spray Mouth/Throat PRN Demetria Pore, MD   1 spray at 04/23/11 0156  . psyllium (HYDROCIL/METAMUCIL) packet 1 packet  1 packet Oral PRN Shelly Flatten, MD      . simvastatin (ZOCOR) tablet 40 mg  40 mg Oral q1800 Renold Don, MD   40 mg at 04/22/11 1714  . tiotropium (SPIRIVA) inhalation capsule 18 mcg  18 mcg Inhalation Daily Leighton Roach McDiarmid, MD   18 mcg at 04/22/11 [phone]  . traMADol (ULTRAM) tablet 50 mg  50 mg Oral Q6H PRN Carney Living, MD   50 mg at 04/23/11 0156  . traMADol (ULTRAM) tablet 50 mg  50 mg Oral Q6H PRN Marena Chancy, MD      . DISCONTD: furosemide (LASIX) injection 20 mg  20 mg Intravenous Daily Leighton Roach McDiarmid, MD   20 mg at 04/22/11 0958  . DISCONTD:  psyllium (HYDROCIL/METAMUCIL) packet 1 packet  1 packet Oral Daily Shelly Flatten, MD         PE: Gen: NAD  HEENT: MMM  CV: RRR, no m/r/g  JXB:JYNW Ronchi heard throughout, breath sounds improved. normal effort. on 6L [place]  GNF:AOZH non-tender  Neuro: CN grossly intact   Labs/Studies:  Basic Metabolic Panel:    Component Value Date/Time   NA 136 04/23/2011 0555   K 3.8 04/23/2011 0555   CL 99 04/23/2011 0555   CO2 25 04/23/2011 0555   BUN 13 04/23/2011 0555   CREATININE 0.65 04/23/2011 0555   GLUCOSE 187* 04/23/2011 0555   CALCIUM 8.8 04/23/2011 0555   CBG (last 3)   Basename 04/23/11 0736 04/22/11 2006 04/22/11 1615  GLUCAP 165* 93 316*    Total urine protein: 5.8  Assessment/Plan:  71yo male w/ severe pneumonia and sepsis requiring 6 day stay in ICU including intubation, sent to floor yesterday.   1. Pneumonia: Extubated Saturday. Still on 6L [place]. No overnight hypoxemia which is an improvement from the prior two nights. Respiratory status improved this am after continued diuresis and ABX. Net output of 2.9L yesterday (4.6L since admission). Levaquin stopped yesterday. Recommend f/u film in approximately 4 wks after DC for pneumonia and previously noted curvlinear density noted.  - Continue Lasix 20 Qday  - F/u output  - Wean O2 as tolerated  - continue incentive spirometer   2. NSTEMI: No CP complaints. No events since transfer to telemetry. Cardiology following. Appreciated input. Secondary to demand ischemia. Cardiology recommending coronary angio prior to DC. - Continue lopressor 25BID, ASA, Zocor  - Enalapirl to 10mg  Qday  - Will schedule for angio  3. DM: Carb mod diet. Well controlled. Consider restarting home glipizide, lantus, and metformin  - continue SSI  - Restart home Lantus 15 QHS   4. Prophylaxis:  - SCD  - Lovenox  - Protonix   5. Musculoskeletal: Neck/shoulder pain virtually gone today. Pt reports significant relief w/ icepacks.  - Continue PT/OT  -  Continue Toradol 30mg  IV Q6 PRN  - Heating pad / Ice pack PRN   6. FEN/GI: Protein calorie malnutrition. Nutrition following. Now on Glucerna. Urinary protein as above. Low albumin likely from protein calorie malnutrition. Hyponatremia resolved.   - Continue Imodium, and probiotics  - KVO  - Urine protein level  -  f/u Nutrition recs   7. Social: Pt agreeable to going to SNF for short term rehab. Awaiting placement. FL2 signed. CSW following. OT/PT following  - Continue CSW  - PT/OT   8. Disposition: pending improvement and placement to a SNF for short term rehab      Signed: Shelly Flatten, MD Family Medicine Resident PGY-1 [phone] 04/23/2011 7:49 AM

## 2011-04-23 NOTE — Progress Notes (Signed)
Pt. Was discussed in Length of Stay meeting this morning.  Theresia Bough, MSW, Theresia Majors [phone]

## 2011-04-23 NOTE — Progress Notes (Signed)
Clinical Child psychotherapist (CSW) provided pt with bed offers. Pt stated he would like to go to [facility] when medically stable. CSW contacted [facility] who informed CSW that pt okay to dc tomorrow or when stable. MD aware of update. CSW will facilitate with dc when pt stable.  Theresia Bough, MSW, Theresia Majors [phone]

## 2011-04-23 NOTE — Progress Notes (Signed)
PT Cancellation Note  Treatment cancelled today due to medical issues with patient which prohibited therapy. Pt c/o 10/10 Left shoulder pain. RN notified. Will follow.  Ralene Bathe Kistler 04/23/2011, 12:49 PM [phone]

## 2011-04-23 NOTE — Progress Notes (Signed)
Patient discussed at the Long Length of Stay Daniel Tyler 04/23/2011  

## 2011-04-23 NOTE — Discharge Summary (Addendum)
Family Medicine Resident Discharge Summary  Patient ID: Daniel Tyler [id number] 71 y.o. 1940-03-15  Admit date: 04/14/2011  Discharge date and time: 04/25/11   Admitting Physician: Carney Living, MD   Discharge Physician: Acquanetta Belling, MD  Admission Diagnoses: Severe sepsis [995.92] Rhabdomyolysis [728.88] Pneumonia [486] Weakness [780.79] Hyperglycemia [790.29]  Discharge Diagnoses: Pneumonia, Sepsis  Admission Condition: critical  Discharged Condition: good  Indication for Admission: Severe sepsis and pneumonia w/ hypoxemia  Hospital Course: Admitted w/ 4 day h/o progressively worsening cough and SOB w/ fall and inability to get up. Initially admitted to our service but transferred to the ICU a few hours later.   Sepsis/ CAP: Pt initially given fluid resuscitation and started on antibiotics upon arrival in the ED/floor. Condition deteriorated significantly, and due to concern for pulmonary edema, critical care team called for intubation and admission. Pt managed by critical care team requiring extubation and reintubation due to respiratory failure. Pt again extubated and transferred out to telemetry on the 24th. Pt maintained on [facility] w/ multiple attempts to wean during admission. Pt still requiring supplemental O2 at time of DC. Antibiotics were stopped after 9 days of total treatment. Antibiotics included Levaquin, Vanc, Zosyn, and Cipro. Pt received regular diuresis and supplemental O2 throughout admission due to significant pulmonary congestion, w/ improvement and maintenance of respiratory effort. Pt requiring up to 6L [place] to maintain saturations prior to D/c.  X-Ray on 3/1 revealed  Pt afebrile and vital signs stable for several days prior to discharge.   Non-ST elevation MI: During time in ICU pt noted to have significant Troponin elevation. Cardiology consulted and pt started on heparin drip. Pt symptoms subsided, and heparin was stopped prior to discharge. Pt underwent  cardiac catheterization prior to dishcarge where significant 3 vessel disease was noted. Patient seen by CVTS surgeon Dr. Dorris Fetch who recommends follow-up as an outpatient for potential CABG.   Nutrition: Pt noted to have low albumin w/ normal urine protein and normal total serum protein. Due to protein calorie malnutrition, nutrition was consulted and provided dietary recommendations.   Consults: cardiology and Critical Care medicine, nutrition, PT,OT, CVTS  Discharge physical exam: Gen: NAD  HEENT: AT/[place], MMM  CV: RRR, no m/r/g  Res: Coarse breath sounds bilateral bases L>R, normal effort, sating 90s on 6L  Musc: No tenderness to palpation in the shoulder/upper back/neck region  WUJ:WJXB non-tender  Neuro: CN grossly intact; no focal deficits; moves 4 ext  Significant Diagnostic Studies:  LABS: Hematology:  Lab 05/04/11 1239 04/22/11 0549 04/21/11 0630  WBC 12.7* 14.4* 14.7*  HGB 10.6* 10.0* 10.1*  HCT 31.4* 30.4* 30.0*  PLT 635* 577* 553*  RDW 14.0 14.0 13.9  MCV 85.8 86.9 86.0  MCHC 33.8 32.9 33.7  04/14/11 CBC WBC 10.6   Lab 04/20/11 0955  IRON 18*  TIBC 217  FERRITIN 396*   Metabolic:  Lab 04/25/11 0916 05/04/2011 1239 04/23/11 0555 04/22/11 0549  NA 131* -- 136 132*  K 3.5 -- 3.8 4.6  CL 97 -- 99 95*  CO2 25 -- 25 28  BUN 12 -- 13 14  CREATININE 0.66 0.60 0.65 --  GLUCOSE 185* -- 187* 196*  CALCIUM 8.8 -- 8.8 9.2  MG -- -- -- --  PHOS -- -- -- --   04/14/11 BMET NA 130 K 4.4 Cl 90 CO2 20 Cr 0.99 Ca 10.2  Lab 04/22/11 0549 04/20/11 0955  AST 20 --  ALKPHOS 58 --  BILITOT 0.2* --  BILIDIR -- --  PROT  6.9 --  ALBUMIN 2.2* 2.4*  PREALBUMIN -- --   No results found for this basename: CHOL,TRIG,HDL,LDL in the last 168 hours No results found for this basename: TSH,T3FREE,FREET4 in the last 168 hours  Lab 04/25/11 1127 04/25/11 0733 04/24/11 2001 04/24/11 1644 04/24/11 1132 04/24/11 0723 04/23/11 2103 04/23/11 1627 04/23/11 1151 04/23/11 0736    GLUCAP 185* 141* 182* 207* 146* 130* 209* 197* 271* 165*     Lab 04/24/11 0605  INR 1.05    Lab 04/19/11 0520  PHART 7.466*  PCO2ART 35.7  PO2ART 55.0*  HCO3 25.8*  TCO2 27  ACIDBASEDEF --  O2SAT 91.0    Lab Results  Component Value Date   CKTOTAL 1417* 04/15/2011   CKMB 32.4* 04/15/2011   TROPONINI 22.75* 04/15/2011   CDiff PCR - Negative MRSA PCR - Negative  Blood CX no growth at >5days UCX: No growth  Echo - Left ventricle: The cavity size was normal. Systolic function was moderately to severely reduced. The estimated ejection fraction was in the range of 30% to 35%. Moderate diffuse hypokinesis with regional variations. Severe hypokinesis of the inferior myocardium. - Mitral valve: Mild to moderate regurgitation directed posteriorly.   CT HEAD WITHOUT CONTRAST  CT CERVICAL SPINE WITHOUT CONTRAST  Technique: Multidetector CT imaging of the head and cervical spine  was performed following the standard protocol without intravenous  contrast. Multiplanar CT image reconstructions of the cervical  spine were also generated.  Comparison: None   CT HEAD  Findings: Age appropriate atrophy. Negative for hemorrhage. Negative for acute infarct or mass. Chronic sinusitis with mucosal thickening in the paranasal sinuses. Calvarium is intact.  IMPRESSION: No acute intracranial abnormality. Sinusitis.   CT CERVICAL SPINE  Findings: Negative for fracture. Cervical disc degeneration and  moderate to advanced spondylosis at C3-4, C4-5, and C5-6. Milder  spondylosis at C6-7.  Carotid atherosclerotic calcification is present.  IMPRESSION:  Negative for fracture.  CXR - 3/1:  Findings: The lungs show diffuse interstitial changes, similar in appearance to the previous exam. Right pleural effusion appears smaller. No new consolidation identified. Emphysematous changes are present. Heart size is normal. No definite edema. Visualized osseous structures have a normal appearance.    IMPRESSION:  1. Chronic lung disease.  2. Right pleural effusion is smaller.   Disposition: long term care facility  Vitals: Patient Vitals for the past 24 hrs:  BP Temp Temp src Pulse Resp SpO2 Weight  04/25/11 1048 - - - - - 94 % -  04/25/11 0949 116/66 mmHg - - 81  - - -  04/25/11 0500 107/56 mmHg 99.6 F (37.6 C) Oral 82  18  94 % 136 lb 12.8 oz (62.052 kg)  04/24/11 2100 114/64 mmHg 98.6 F (37 C) Oral 78  18  92 % -  04/24/11 1515 123/62 mmHg - - 74  - 91 % -   Wt Readings from Last 3 Encounters:  04/25/11 136 lb 12.8 oz (62.052 kg)  04/25/11 136 lb 12.8 oz (62.052 kg)    Intake/Output Summary (Last 24 hours) at 04/25/11 1511 Last data filed at 04/25/11 0500  Gross per 24 hour  Intake 604.93 ml  Output   3300 ml  Net -2695.07 ml      Discharge Information    Medication List  As of 04/25/2011  3:11 PM   STOP taking these medications         aspirin 325 MG tablet         TAKE these medications  acetaminophen 500 MG tablet   Commonly known as: TYLENOL   Take 1,000 mg by mouth every 6 (six) hours as needed. For pain      acidophilus Caps   Take 1 capsule by mouth daily.      albuterol 108 (90 BASE) MCG/ACT inhaler   Commonly known as: PROVENTIL HFA;VENTOLIN HFA   Inhale 2 puffs into the lungs every 6 (six) hours as needed for wheezing or shortness of breath (Hypoxemia).      clopidogrel 75 MG tablet   Commonly known as: PLAVIX   Take 1 tablet (75 mg total) by mouth daily with breakfast.      enalapril 10 MG tablet   Commonly known as: VASOTEC   Take 1 tablet (10 mg total) by mouth daily.      feeding supplement Liqd   Take 237 mLs by mouth 2 (two) times daily between meals.      furosemide 20 MG tablet   Commonly known as: LASIX   Take 1 tablet (20 mg total) by mouth daily.      glipiZIDE 10 MG tablet   Commonly known as: GLUCOTROL   Take 10 mg by mouth 2 (two) times daily before a meal.      guaiFENesin 600 MG 12 hr tablet    Commonly known as: MUCINEX   Take 2 tablets (1,200 mg total) by mouth 2 (two) times daily.      insulin glargine 100 UNIT/ML injection   Commonly known as: LANTUS   Inject 15 Units into the skin at bedtime.      loperamide 2 MG capsule   Commonly known as: IMODIUM   Take 1 capsule (2 mg total) by mouth 4 (four) times daily as needed for diarrhea or loose stools.      metFORMIN 1000 MG tablet   Commonly known as: GLUCOPHAGE   Take 1,000 mg by mouth 2 (two) times daily with a meal.      metoprolol tartrate 25 MG tablet   Commonly known as: LOPRESSOR   Take 1 tablet (25 mg total) by mouth 2 (two) times daily.      Muscle Rub 10-15 % Crea   Apply 1 application topically as needed.      psyllium 95 % Pack   Commonly known as: HYDROCIL/METAMUCIL   Take 1 packet by mouth as needed.      simvastatin 40 MG tablet   Commonly known as: ZOCOR   Take 1 tablet (40 mg total) by mouth daily at 6 PM.      tiotropium 18 MCG inhalation capsule   Commonly known as: SPIRIVA   Place 1 capsule (18 mcg total) into inhaler and inhale daily.      traMADol 50 MG tablet   Commonly known as: ULTRAM   Take 1 tablet (50 mg total) by mouth every 6 (six) hours as needed.            Follow Up Issues and Recommendations:    Discharge Orders    Future Appointments: Provider: Department: Dept Phone: Center:   05/22/2011 10:30 AM Loreli Slot, MD [facility] [phone] [facility]     Follow-up Information    Schedule an appointment as soon as possible for a visit with TILLEY JR,W SPENCER, MD. (after get out of nursing home)    Contact information:   [address] [place] [address] [phone]       Follow up with Loreli Slot, MD. (Call after  discharge and schedule an appointment for 3-4 weeks to discuss possible heart surgery)    Contact information:   [address] [place] [address] [phone]          Follow-up Items: 1. Cardiology for Cath, MI, mild CHF 2. Pneumonia - Requiring 6L [place] at time of d/c 3. CVTS - follow up in 3-4 weeks to assess surgical candidacy at that time  Discharge condition: Patient discharged to SNF in stable medical condition.    Signed: Gaspar Bidding, DO Family Medicine Resident PGY-1 [phone] 04/25/2011 3:11 PM  Etta Quill. Madolyn Frieze, MD Family Medicine PGY-2 [phone] 04/26/2011

## 2011-04-24 ENCOUNTER — Encounter (HOSPITAL_COMMUNITY)
Admission: EM | Disposition: A | Discharge status: Skilled Nursing Facility | Payer: Self-pay | Source: Home / Self Care | Attending: Family Medicine

## 2011-04-24 DIAGNOSIS — Z794 Long term (current) use of insulin: Secondary | ICD-10-CM

## 2011-04-24 DIAGNOSIS — E118 Type 2 diabetes mellitus with unspecified complications: Secondary | ICD-10-CM

## 2011-04-24 DIAGNOSIS — I2510 Atherosclerotic heart disease of native coronary artery without angina pectoris: Secondary | ICD-10-CM

## 2011-04-24 DIAGNOSIS — E119 Type 2 diabetes mellitus without complications: Secondary | ICD-10-CM

## 2011-04-24 HISTORY — PX: LEFT HEART CATHETERIZATION WITH CORONARY ANGIOGRAM: SHX5451

## 2011-04-24 LAB — CBC
HCT: 31.4 % — ABNORMAL LOW (ref 39.0–52.0)
MCV: 85.8 fL (ref 78.0–100.0)
Platelets: 635 10*3/uL — ABNORMAL HIGH (ref 150–400)
RBC: 3.66 MIL/uL — ABNORMAL LOW (ref 4.22–5.81)
RDW: 14 % (ref 11.5–15.5)
WBC: 12.7 10*3/uL — ABNORMAL HIGH (ref 4.0–10.5)

## 2011-04-24 LAB — CREATININE, SERUM
GFR calc Af Amer: >90 mL/min (ref >90)
GFR calc non Af Amer: >90 mL/min (ref >90)

## 2011-04-24 LAB — PROTIME-INR: INR: 1.05 (ref 0.00–1.49)

## 2011-04-24 LAB — GLUCOSE, CAPILLARY: Glucose-Capillary: 146 mg/dL — ABNORMAL HIGH (ref 70–99)

## 2011-04-24 SURGERY — LEFT HEART CATHETERIZATION WITH CORONARY ANGIOGRAM
Anesthesia: LOCAL

## 2011-04-24 MED ORDER — ACETAMINOPHEN 325 MG PO TABS: 650.0000 mg | ORAL_TABLET | ORAL | Status: DC | PRN

## 2011-04-24 MED ORDER — PANTOPRAZOLE SODIUM 40 MG PO TBEC
40.0000 mg | DELAYED_RELEASE_TABLET | Freq: Every day | ORAL | Status: DC
  Administered 2011-04-24 – 2011-04-25 (×2): 40 mg via ORAL
  Filled 2011-04-24 (×2): qty 1

## 2011-04-24 MED ORDER — LIDOCAINE HCL (PF) 1 % IJ SOLN
INTRAMUSCULAR | Status: AC
  Filled 2011-04-24: qty 30

## 2011-04-24 MED ORDER — SODIUM CHLORIDE 0.9 % IV SOLN: 1.0000 mL/kg/h | INTRAVENOUS | Status: DC

## 2011-04-24 MED ORDER — ENOXAPARIN SODIUM 40 MG/0.4ML ~~LOC~~ SOLN
40.0000 mg | SUBCUTANEOUS | Status: DC
  Administered 2011-04-24 – 2011-04-25 (×2): 40 mg via SUBCUTANEOUS
  Filled 2011-04-24 (×3): qty 0.4

## 2011-04-24 MED ORDER — HEPARIN (PORCINE) IN NACL 2-0.9 UNIT/ML-% IJ SOLN
INTRAMUSCULAR | Status: AC
  Filled 2011-04-24: qty 2000

## 2011-04-24 MED ORDER — METOPROLOL TARTRATE 25 MG PO TABS
25.0000 mg | ORAL_TABLET | Freq: Two times a day (BID) | ORAL | Status: DC
  Administered 2011-04-24 – 2011-04-26 (×4): 25 mg via ORAL
  Filled 2011-04-24 (×5): qty 1

## 2011-04-24 MED ORDER — NITROGLYCERIN 0.2 MG/ML ON CALL CATH LAB
INTRAVENOUS | Status: AC
  Filled 2011-04-24: qty 1

## 2011-04-24 MED ORDER — FUROSEMIDE 10 MG/ML IJ SOLN
80.0000 mg | Freq: Once | INTRAMUSCULAR | Status: AC
  Administered 2011-04-24: 80 mg via INTRAVENOUS
  Filled 2011-04-24: qty 8

## 2011-04-24 NOTE — Progress Notes (Signed)
PT Cancellation Note  Treatment cancelled today due to medical issues with patient which prohibited therapy.  Patient on bedrest after cath this am.  Will check back tomorrow as able.  Thanks. INGOLD,Valgene Deloatch 04/24/2011, 11:45 AM  [facility] [phone] (pager)

## 2011-04-24 NOTE — Downtime Event Note (Signed)
Called to bedside to evaluate pt due to desats to ~90% on 6L . Pt placed on IVF for cath procedure and continued since time of cath this morning. Ronchi increased from exam this morning w/ crackles at lung bases. Good respiratory effort. In no distress. No [place]. Likely fluid overloaded from IVF throughout the day.   Lasix 80mg  IV x1 Continue O2 PRN to keep O2 sats >90  Shelly Flatten, MD Family Medicine Resident PGY-1 04/24/11 4:03 PM

## 2011-04-24 NOTE — Progress Notes (Signed)
Patient's oxygen saturations post cath 84-86 % room air.  Oxygen reapplied at 3 liters up to 89%, titrated up to 5 liters sating 92-94%.  Patient started to desat to 89% 5 liters, oxygen increased to 6 liters [place], sats sustaining 88-90%.  Dr. Konrad Dolores paged and notified.  Up to see patient.  Orders received and carried out.  IV fluids discontinued per MD order at bedside.  2500 urine output post IV lasix administration.  Oxygen sats up to 94 % 6 liters [place].  Will continue to monitor.  Colman Cater

## 2011-04-24 NOTE — Progress Notes (Signed)
Subjective:  Much less sleepy today and had extensive discussion again about cath and treatment of CAD.  No chest pain or shoulder pain today.    Objective:  Vital Signs in the last 24 hours: BP 153/68  Pulse 82  Temp(Src) 98 F (36.7 C) (Oral)  Resp 18  Ht 5\' 9"  (1.753 m)  Wt 64.411 kg (142 lb)  BMI 20.97 kg/m2  SpO2 90% on 6:L  Physical Exam: Elderly WM NAD Lungs:  Decreased BS bilat  Cardiac:  Regular rhythm, normal S1 and S2, no S3, 1-2 6 systolic murmur Abdomen: negative Extremities:  No edema present  Intake/Output from previous day: 02/27 0701 - 02/28 0700 In: 325 [I.V.:325] Out: 1675 [Urine:1675]  Lab Results: Basic Metabolic Panel:  Basename 04/23/11 0555 04/22/11 0549  NA 136 132*  K 3.8 4.6  CL 99 95*  CO2 25 28  GLUCOSE 187* 196*  BUN 13 14  CREATININE 0.65 0.73   CBC:  Basename 04/22/11 0549  WBC 14.4*  NEUTROABS --  HGB 10.0*  HCT 30.4*  MCV 86.9  PLT 577*   BNP    Component Value Date/Time   PROBNP 1087.0* 04/16/2011 1031    Telemetry: Sinus rhythm No sig arrhythmias    Assessment/Plan:  1.Bilateral pneumonia and sepsis better 2. NonSTEMI with decreased LV and mild CHF  REC:  Plan cardiac cath today.  Cardiac catheterization again was was discussed with the patient fully including risks of myocardial infarction, death, stroke, bleeding, arrhythmia, dye allergy, renal insufficiency or bleeding.  The patient understands and is willing to proceed. Will decide post cath treatment plan.    Darden Palmer.  MD [facility] 04/24/2011, 8:18 AM

## 2011-04-24 NOTE — Progress Notes (Signed)
Family Medicine Teaching Service [facility] Progress Note  Patient name: Daniel Tyler Medical record number: [id number] Date of birth: 28-Aug-1940 Age: 71 y.o. Gender: male    LOS: 10 days   Primary Care Provider: Dois Davenport., MD, MD  Overnight Events:  NAEO. Feeling well this am. Still w/ minimal shoulder/neck pain.   Objective: Vital signs in last 24 hours: Temp:  [97.7 F (36.5 C)-98 F (36.7 C)] 98 F (36.7 C) (02/28 0449) Pulse Rate:  [61-130] 61  (02/28 1115) Resp:  [18-20] 18  (02/28 0449) BP: (124-153)/(53-85) 124/59 mmHg (02/28 1115) SpO2:  [90 %-93 %] 90 % (02/28 0449) Weight:  [142 lb (64.411 kg)] 142 lb (64.411 kg) (02/28 0449)  Wt Readings from Last 3 Encounters:  04/24/11 142 lb (64.411 kg)  04/24/11 142 lb (64.411 kg)     Current Facility-Administered Medications  Medication Dose Route Frequency Provider Last Rate Last Dose  . 0.9 %  sodium chloride infusion   Intravenous Continuous Coralyn Helling, MD 10 mL/hr at 04/23/11 0817    . 0.9 %  sodium chloride infusion  1 mL/kg/hr Intravenous Continuous W Ashley Royalty., MD 64.4 mL/hr at 04/24/11 1000 1 mL/kg/hr at 04/24/11 1000  . acetaminophen (TYLENOL) tablet 650 mg  650 mg Oral Q4H PRN W Ashley Royalty., MD      . acidophilus (RISAQUAD) capsule 1 capsule  1 capsule Oral Daily Shelly Flatten, MD   1 capsule at 04/24/11 1111  . albuterol (PROVENTIL HFA;VENTOLIN HFA) 108 (90 BASE) MCG/ACT inhaler 2 puff  2 puff Inhalation Q6H PRN Leighton Roach McDiarmid, MD      . antiseptic oral rinse (BIOTENE) solution 15 mL  15 mL Mouth Rinse BID Carney Living, MD   15 mL at 04/24/11 0803  . aspirin chewable tablet 162 mg  162 mg Oral Daily Carney Living, MD      . aspirin chewable tablet 324 mg  324 mg Oral Pre-Cath W Ashley Royalty., MD   324 mg at 04/24/11 0750  . diazepam (VALIUM) tablet 5 mg  5 mg Oral On Call W Ashley Royalty., MD   5 mg at 04/24/11 [id number]  . enalapril (VASOTEC) tablet 10 mg  10 mg Oral Daily  Shelly Flatten, MD   10 mg at 04/24/11 1110  . enoxaparin (LOVENOX) injection 40 mg  40 mg Subcutaneous Q24H W Ashley Royalty., MD      . feeding supplement (GLUCERNA SHAKE) liquid 237 mL  237 mL Oral BID BM Waylan Boga [place], RD   237 mL at 04/22/11 1500  . fentaNYL (SUBLIMAZE) injection 25-100 mcg  25-100 mcg Intravenous Q2H PRN Billy Fischer, MD   100 mcg at 04/17/11 1191  . furosemide (LASIX) tablet 20 mg  20 mg Oral Daily Shelly Flatten, MD   20 mg at 04/24/11 1110  . guaiFENesin (MUCINEX) 12 hr tablet 1,200 mg  1,200 mg Oral BID Leighton Roach McDiarmid, MD   1,200 mg at 04/24/11 1110  . heparin 2-0.9 UNIT/ML-% infusion           . insulin aspart (novoLOG) injection 0-15 Units  0-15 Units Subcutaneous TID WC Marena Chancy, MD   2 Units at 04/24/11 1152  . insulin glargine (LANTUS) injection 15 Units  15 Units Subcutaneous QHS Shelly Flatten, MD   15 Units at 04/23/11 2108  . ketorolac (TORADOL) 30 MG/ML injection 30 mg  30 mg Intravenous Q6H PRN Shelly Flatten, MD   30 mg at 04/22/11 1330  .  lidocaine (XYLOCAINE) 1 % injection           . loperamide (IMODIUM) capsule 2 mg  2 mg Oral BID Shelly Flatten, MD   2 mg at 04/24/11 1110  . metoprolol tartrate (LOPRESSOR) tablet 25 mg  25 mg Oral BID Carney Living, MD      . Muscle Rub CREA   Topical PRN Marena Chancy, MD      . nitroGLYCERIN (NTG ON-CALL) 0.2 mg/mL injection           . pantoprazole (PROTONIX) EC tablet 40 mg  40 mg Oral Q1200 Carney Living, MD      . phenol (CHLORASEPTIC) mouth spray 1 spray  1 spray Mouth/Throat PRN Demetria Pore, MD   1 spray at 04/23/11 0156  . psyllium (HYDROCIL/METAMUCIL) packet 1 packet  1 packet Oral PRN Shelly Flatten, MD      . simvastatin (ZOCOR) tablet 40 mg  40 mg Oral q1800 Renold Don, MD   40 mg at 04/23/11 1729  . tiotropium (SPIRIVA) inhalation capsule 18 mcg  18 mcg Inhalation Daily Leighton Roach McDiarmid, MD   18 mcg at 04/24/11 1156  . traMADol (ULTRAM) tablet 50 mg  50 mg Oral  Q6H PRN Carney Living, MD   50 mg at 04/24/11 0111  . traMADol (ULTRAM) tablet 50 mg  50 mg Oral Q6H PRN Marena Chancy, MD      . DISCONTD: 0.9 %  sodium chloride infusion   Intravenous Continuous W Ashley Royalty., MD 50 mL/hr at 04/24/11 0030    . DISCONTD: acetaminophen (TYLENOL) tablet 650 mg  650 mg Oral Q6H PRN Renold Don, MD   650 mg at 04/23/11 1248  . DISCONTD: aspirin chewable tablet 162 mg  162 mg Oral Daily Renold Don, MD   162 mg at 04/23/11 1026  . DISCONTD: enoxaparin (LOVENOX) injection 40 mg  40 mg Subcutaneous Q24H Anh P Pham, PHARMD   40 mg at 04/23/11 1452  . DISCONTD: metoprolol tartrate (LOPRESSOR) 25 mg/10 mL oral suspension 25 mg  25 mg Oral Q12H Renold Don, MD   25 mg at 04/24/11 1109  . DISCONTD: pantoprazole sodium (PROTONIX) 40 mg/20 mL oral suspension 40 mg  40 mg Oral Q24H Renold Don, MD   40 mg at 04/23/11 1729     PE: Gen: NAD  HEENT: MMM  CV: RRR, no m/r/g  YNW:GNFA [place] heard throughout, breath sounds improved. normal effort. on 6L [place]  Musc: No tenderness to palpation in the shoulder/upper back/neck region OZH:YQMV non-tender  Neuro: CN grossly intact   Labs/Studies:  CBG (last 3)   Basename 04/24/11 1132 04/24/11 0723 04/23/11 2103  GLUCAP 146* 130* 209*      Assessment/Plan:  71yo male w/ severe pneumonia and sepsis requiring 6 day stay in ICU including intubation, sent to floor yesterday.  1. Pneumonia: Extubated Saturday. Still on 6L [place]. No overnight hypoxemia which is an improvement from the prior two nights. Respiratory status improved this am after continued diuresis and ABX. Net output of 2.9L yesterday (4.6L since admission). Levaquin stopped yesterday. Recommend f/u film in approximately 4 wks after DC for pneumonia and previously noted curvlinear density noted.  - Continue Lasix 20 Qday  - F/u output  - Wean O2 as tolerated  - continue incentive spirometer   2. NSTEMI: No CP complaints. No events since transfer to  telemetry. Cardiology following. Appreciated input. Secondary to demand ischemia. Cardiology recommending coronary angio prior to DC. Cath today  showed significant 3 vessel disease. Will wait for surgery consult to decide on possible CABG - Continue lopressor 25BID, ASA, Zocor  - Enalapirl to 10mg  Qday  - Consult for future CABG  3. DM: Carb mod diet. Well controlled. Consider restarting home glipizide, lantus, and metformin  - continue SSI  - Restart home Lantus 15 QHS   4. Prophylaxis:  - SCD  - Lovenox  - Protonix   5. Musculoskeletal: Neck/shoulder pain virtually gone today. Pt reports significant relief w/ icepacks.  - Continue PT/OT  - Continue Toradol 30mg  IV Q6 PRN  - Heating pad / Ice pack PRN   6. FEN/GI: Protein calorie malnutrition. Nutrition following. Now on Glucerna. Urinary protein as above. Low albumin likely from protein calorie malnutrition. Hyponatremia resolved.  - Continue Imodium, and probiotics  - KVO  - Urine protein level  - f/u Nutrition recs   7. Social: Pt agreeable to going to SNF for short term rehab. Awaiting placement. FL2 signed. CSW following. OT/PT following  - Continue CSW  - PT/OT   8. Disposition: pending improvement and placement to a SNF for short term rehab           Signed: Shelly Flatten, MD Family Medicine Resident PGY-1 [phone] 04/24/2011 1:31 PM

## 2011-04-24 NOTE — Progress Notes (Signed)
I have seen and examined this patient. I have discussed with Dr Konrad Dolores.  I agree with their findings and plans as documented in their progress note for today. Acute Issues 1. 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  - Awaiting CVTS consultation 2. COPD - Spiriva, prn SABD - 02 supplement.  Will discharge with home O2  3. Disposition - SNF pending CVTS recommendations

## 2011-04-24 NOTE — Progress Notes (Signed)
Received order stating "If PCI, Cardiac Rehab" however pt did not receive a PCI. Needs CABG. Please reorder if CRPI services warranted. Will not f/u. Thx. Ethelda Chick CES, ACSM

## 2011-04-24 NOTE — CV Procedure (Signed)
Cardiac Catheterization Report   Daniel Tyler  [birth year]  [id number]  04/24/2011   PROCEDURE:  Left heart catheterization with selective coronary angiography, left ventriculogram.  INDICATIONS:  Non-ST elevation myocardial infarction in the setting of sepsis  The risks, benefits, and details of the procedure were explained to the patient.  The patient verbalized understanding and wanted to proceed.  Informed written consent was obtained.  PROCEDURE TECHNIQUE:  After Xylocaine anesthesia a 107F sheath was placed in the right femoral artery with a single anterior needle wall stick.   Left coronary angiography was done using a Judkins L4 guide catheter.  The right coronary artery had an anterior takeoff and was selected using an Amplatz left 1 catheter. A 30 cc ventriculogram was performed and the patient was taken to the holding area for sheath removal. He tolerated the procedure well.  CONTRAST:  Total of 80 cc.  COMPLICATIONS:  None.    HEMODYNAMICS:  Aortic post contrast 130/53 LV post contrast 130/3-6 There was no gradient between the left ventricle and aorta.    ANGIOGRAPHIC DATA:    CORONARY ARTERIES:   Arise and distribute normally. Right dominant. Significant coronary calcification is noted.  Left main coronary artery: Mild narrowing  Left anterior descending: Calcified proximally. The diagonal branch has an ostial 90% stenosis. A  branch of the diagonal branch has a severe 95% stenosis. There is a 60% mid LAD stenosis..  Circumflex coronary artery: Calcified. Proximal 60% stenosis. The first marginal branch has a severe segmental 90% stenosis. There is another 90% stenosis in the midportion of the circumflex prior to a second marginal branch.  Right coronary artery: Calcified. Anterior takeoff and best selected with an Amplatz left 1 catheter. Severe 90% proximal and segmental 90% mid vessel stenosis and distal stenosis noted in the artery.  Posterior lateral branch is subtotally occluded and has a severe 80% stenosis prior to the posterolateral branch. Mild diffuse disease involving the posterior descending artery.Marland Kitchen  LEFT VENTRICULOGRAM:  Performed in the 30 RAO projection.  The aortic and mitral valves are normal. The left ventricle is normal in size with severe hypokinesis of the inferior wall. The estimated ejection fraction is 40-45%.  IMPRESSIONS:  1. Significant three-vessel coronary artery disease with significant stenosis involving the circumflex and right coronary artery, ostium of the diagonal branch and mid LAD 2. Abnormal ventricular function with inferior hypokinesis  RECOMMENDATION:    The patient is a long-standing diabetic and would best be served with coronary bypass grafting. He is still recovering from significant sepsis and bilateral pneumonia. I would recommend a thoracic surgery consultation and that the patient could be discharged on maximum medical therapy to include Plavix. Once he is recovered from his pneumonia he could be considered for coronary bypass grafting if he is a candidate from the surgeon's viewpoint.   Darden Palmer MD [facility]

## 2011-04-25 ENCOUNTER — Inpatient Hospital Stay (HOSPITAL_COMMUNITY): Payer: Medicare Other

## 2011-04-25 DIAGNOSIS — I2510 Atherosclerotic heart disease of native coronary artery without angina pectoris: Secondary | ICD-10-CM

## 2011-04-25 LAB — BASIC METABOLIC PANEL
BUN: 12 mg/dL (ref 6–23)
Chloride: 97 mEq/L (ref 96–112)
Creatinine, Ser: 0.66 mg/dL (ref 0.50–1.35)
GFR calc Af Amer: >90 mL/min (ref >90)
Glucose, Bld: 185 mg/dL — ABNORMAL HIGH (ref 70–99)
Potassium: 3.5 mEq/L (ref 3.5–5.1)

## 2011-04-25 MED ORDER — ASPIRIN 81 MG PO CHEW
81.0000 mg | CHEWABLE_TABLET | Freq: Every day | ORAL | Status: DC
  Administered 2011-04-26: 81 mg via ORAL
  Filled 2011-04-25: qty 1

## 2011-04-25 MED ORDER — GUAIFENESIN ER 600 MG PO TB12: 1200.0000 mg | ORAL_TABLET | Freq: Two times a day (BID) | ORAL | Status: DC

## 2011-04-25 MED ORDER — MUSCLE RUB 10-15 % EX CREA: 1.0000 "application " | TOPICAL_CREAM | CUTANEOUS | Status: DC | PRN

## 2011-04-25 MED ORDER — ALBUTEROL SULFATE HFA 108 (90 BASE) MCG/ACT IN AERS: 2.0000 | INHALATION_SPRAY | Freq: Four times a day (QID) | RESPIRATORY_TRACT | Status: DC | PRN

## 2011-04-25 MED ORDER — PSYLLIUM 95 % PO PACK: 1.0000 | PACK | ORAL | Status: DC | PRN

## 2011-04-25 MED ORDER — ENALAPRIL MALEATE 10 MG PO TABS: 10.0000 mg | ORAL_TABLET | Freq: Every day | ORAL | Status: DC

## 2011-04-25 MED ORDER — GLUCERNA SHAKE PO LIQD: 237.0000 mL | Freq: Two times a day (BID) | ORAL | Status: DC

## 2011-04-25 MED ORDER — LOPERAMIDE HCL 2 MG PO CAPS: 2.0000 mg | ORAL_CAPSULE | Freq: Four times a day (QID) | ORAL | Status: AC | PRN

## 2011-04-25 MED ORDER — RISAQUAD PO CAPS: 1.0000 | ORAL_CAPSULE | Freq: Every day | ORAL | Status: DC

## 2011-04-25 MED ORDER — TIOTROPIUM BROMIDE MONOHYDRATE 18 MCG IN CAPS: 18.0000 ug | ORAL_CAPSULE | Freq: Every day | RESPIRATORY_TRACT | Status: DC

## 2011-04-25 MED ORDER — SIMVASTATIN 40 MG PO TABS: 40.0000 mg | ORAL_TABLET | Freq: Every day | ORAL | Status: DC

## 2011-04-25 MED ORDER — TRAMADOL HCL 50 MG PO TABS: 50.0000 mg | ORAL_TABLET | Freq: Four times a day (QID) | ORAL | Status: AC | PRN

## 2011-04-25 MED ORDER — CLOPIDOGREL BISULFATE 75 MG PO TABS
75.0000 mg | ORAL_TABLET | Freq: Every day | ORAL | Status: DC
  Administered 2011-04-25 – 2011-04-26 (×2): 75 mg via ORAL
  Filled 2011-04-25 (×2): qty 1

## 2011-04-25 MED ORDER — FUROSEMIDE 20 MG PO TABS: 20.0000 mg | ORAL_TABLET | Freq: Every day | ORAL | Status: DC

## 2011-04-25 MED ORDER — CLOPIDOGREL BISULFATE 75 MG PO TABS: 75.0000 mg | ORAL_TABLET | Freq: Every day | ORAL | Status: DC

## 2011-04-25 MED ORDER — METOPROLOL TARTRATE 25 MG PO TABS: 25.0000 mg | ORAL_TABLET | Freq: Two times a day (BID) | ORAL | Status: DC

## 2011-04-25 NOTE — Progress Notes (Signed)
Subjective:  Tolerated cath well yesterday.  Had O2 dest and given Lasix yesterday.  Still requring sig oxygen.  I called Cardiac surgeons yesterday to dee and will ask them again.  Objective:  Vital Signs in the last 24 hours: BP 107/56  Pulse 82  Temp(Src) 99.6 F (37.6 C) (Oral)  Resp 18  Ht 5\' 9"  (1.753 m)  Wt 62.052 kg (136 lb 12.8 oz)  BMI 20.20 kg/m2  SpO2 94% on 6:L  Physical Exam: Elderly WM NAD Lungs:  Decreased BS bilat  Cardiac:  Regular rhythm, normal S1 and S2, no S3, 1-2 6 systolic murmur Extremities:  Cath site celan and dry Intake/Output from previous day: 02/28 0701 - 03/01 0700 In: 994.9 [P.O.:480; I.V.:514.9] Out: 3900 [Urine:3900]  Lab Results: Basic Metabolic Panel:  Basename 04/24/11 1239 04/23/11 0555  NA -- 136  K -- 3.8  CL -- 99  CO2 -- 25  GLUCOSE -- 187*  BUN -- 13  CREATININE 0.60 0.65   CBC:  Basename 04/24/11 1239  WBC 12.7*  NEUTROABS --  HGB 10.6*  HCT 31.4*  MCV 85.8  PLT 635*   BNP    Component Value Date/Time   PROBNP 1087.0* 04/16/2011 1031    Telemetry: Sinus rhythm Occasional PAC's   Assessment/Plan:  1. Severe 3VD with reduced LV 2. Prior bilat pneumonia with sepsis still recovering  REC:  Cardiac surgeons to see prior to d/c.  Start Plavix for cardioprotection.  He will need to heal up from pneumonia prior to CABG.     Darden Palmer.  MD [facility] 04/25/2011, 8:53 AM

## 2011-04-25 NOTE — Progress Notes (Signed)
I have seen and examined this patient. I have discussed with Dr Berline Chough.  I agree with their findings and plans as documented in their progress note for today.  1. 3-vessel coronary artery disease, s/p NSTEMI, EF40-45%  - Currently on aspirin, enalapril, lopressor, simvastatin, furosemide 20 mg per oral daily. - Started on clopidogrel by Dr Donnie Aho as antiplatelet therapy - Oxygen saturation improved after dose IV Lasix yesterday.  - CHEST XRAY (2V) today to follow up hypoxemia and bibasilar fine crackles, left > right - Awaiting CVTS consultation  2. COPD  - Spiriva, prn SABD  - 02 supplement. Will discharge with home O2  3. Disposition  - SNF pending CVTS recommendations

## 2011-04-25 NOTE — Progress Notes (Signed)
   CARE MANAGEMENT NOTE 04/25/2011  Patient:  Daniel Tyler, Daniel Tyler   Account Number:  [id number]  Date Initiated:  04/21/2011  Documentation initiated by:  Ronny Flurry  Subjective/Objective Assessment:   DX: bilateral PNA , sepsis and NSTEMI     Action/Plan:   Anticipated DC Date:  04/25/2011   Anticipated DC Plan:  HOME W HOME HEALTH SERVICES         Choice offered to / List presented to:             Status of service:  Completed, signed off Medicare Important Message given?   (If response is "NO", the following Medicare IM given date fields will be blank) Date Medicare IM given:   Date Additional Medicare IM given:    Discharge Disposition:  SKILLED NURSING FACILITY  Per UR Regulation:  Reviewed for med. necessity/level of care/duration of stay  Comments:  04-25-11 [address] Tomi Bamberger, RN,BSN [phone] CM did speak to RN and pt is scheduled for d/c to [facility]. Unsure if plan is for today. CM did call CSW Nelva Bush and she is following the pt.

## 2011-04-25 NOTE — Progress Notes (Signed)
OT goal addendum   Acute Rehab OT Goals OT Goal Formulation: With patient ADL Goals Pt Will Perform Grooming: with set-up;Standing at sink ADL Goal: Grooming - Progress: Goal set today Pt Will Perform Lower Body Dressing: with set-up;Sit to stand from chair ADL Goal: Lower Body Dressing - Progress: Goal set today Pt Will Transfer to Toilet: with supervision ADL Goal: Toilet Transfer - Progress: Goal set today

## 2011-04-25 NOTE — Progress Notes (Signed)
Occupational Therapy Treatment Patient Details Name: Daniel Tyler MRN: [id number] DOB: [birth year] Today's Date: 04/25/2011  OT Assessment/Plan OT Assessment/Plan Comments on Treatment Session: This 71 yo male making steady progress. Will continue to benefit from acute OT and at the SNF. OT Plan: Discharge plan needs to be updated OT Frequency: Min 2X/week Follow Up Recommendations: Skilled nursing facility Equipment Recommended: Defer to next venue OT Goals ADL Goals ADL Goal: Grooming - Progress: Progressing toward goals ADL Goal: Toilet Transfer - Progress: Progressing toward goals  OT Treatment Precautions/Restrictions  Precautions Precautions: Fall Required Braces or Orthoses: No Restrictions Weight Bearing Restrictions: No   ADL ADL Grooming: Performed;Wash/dry face;Brushing hair;Set up;Supervision/safety Where Assessed - Grooming: Unsupported;Standing at sink Toilet Transfer: Simulated;Minimal Dentist Details (indicate cue type and reason): Bed to sink to groom then around bed to recliner Toilet Transfer Method: Ambulating Ambulation Related to ADLs: Min A Mobility  Bed Mobility Bed Mobility: Yes Supine to Sit: 7: Independent;HOB flat Sitting - Scoot to Edge of Bed: 7: Independent Transfers Transfers: Yes Sit to Stand: With upper extremity assist;From bed;Other (comment) (min guard A) Stand to Sit: With armrests;With upper extremity assist;To chair/3-in-1 (Min guard A) Exercises    End of Session OT - End of Session Equipment Utilized During Treatment: Other (comment) (O2 @ 6 liters) Activity Tolerance: Patient tolerated treatment well Patient left: in chair;with call bell in reach General Behavior During Session: [facility] for tasks performed Cognition: [facility] for tasks performed  Evette Georges [phone] 04/25/2011, 3:58 PM

## 2011-04-25 NOTE — Progress Notes (Signed)
Family Medicine Teaching Service [facility] Progress Note  Patient name: Daniel Tyler Medical record number: [id number] Date of birth: 03/04/1940 Age: 71 y.o. Gender: male    LOS: 11 days   Primary Care Provider: Dois Davenport., MD, MD  Overnight Events:  Pt reports feeling much better; denies dyspnea inspite of desaturations; ready to "get out of here"  Objective: Vital signs in last 24 hours: Patient Vitals for the past 24 hrs:  BP Temp Temp src Pulse Resp SpO2 Weight  04/25/11 0949 116/66 mmHg - - 81  - - -  04/25/11 0500 107/56 mmHg 99.6 F (37.6 C) Oral 82  18  94 % 136 lb 12.8 oz (62.052 kg)  04/24/11 2100 114/64 mmHg 98.6 F (37 C) Oral 78  18  92 % -  04/24/11 1515 123/62 mmHg - - 74  - 91 % -  04/24/11 1500 - - - - - 86 % -  04/24/11 1415 132/71 mmHg - - 78  - 91 % -  04/24/11 1400 133/58 mmHg 97.5 F (36.4 C) Oral 61  18  94 % -  04/24/11 1315 120/66 mmHg - - 69  - 91 % -  04/24/11 1215 117/64 mmHg - - 61  - 95 % -  04/24/11 1145 140/72 mmHg - - 64  - 94 % -  04/24/11 1115 124/59 mmHg - - 61  - - -  04/24/11 1109 133/61 mmHg - - 64  - - -  04/24/11 1100 133/61 mmHg - - 64  - - -  04/24/11 1045 127/57 mmHg - - 61  - - -  04/24/11 1030 136/53 mmHg - - 65  - - -    Wt Readings from Last 3 Encounters:  04/25/11 136 lb 12.8 oz (62.052 kg)  04/25/11 136 lb 12.8 oz (62.052 kg)     Current Facility-Administered Medications  Medication Dose Route Frequency Provider Last Rate Last Dose  . 0.9 %  sodium chloride infusion   Intravenous Continuous Coralyn Helling, MD 10 mL/hr at 04/23/11 0817    . acetaminophen (TYLENOL) tablet 650 mg  650 mg Oral Q4H PRN W Ashley Royalty., MD      . acidophilus [facility]) capsule 1 capsule  1 capsule Oral Daily Shelly Flatten, MD   1 capsule at 04/25/11 [phone]  . albuterol (PROVENTIL HFA;VENTOLIN HFA) 108 (90 BASE) MCG/ACT inhaler 2 puff  2 puff Inhalation Q6H PRN Leighton Roach McDiarmid, MD      . antiseptic oral rinse (BIOTENE) solution 15 mL  15  mL Mouth Rinse BID Carney Living, MD   15 mL at 04/25/11 0750  . aspirin chewable tablet 162 mg  162 mg Oral Daily Carney Living, MD   162 mg at 04/25/11 0950  . clopidogrel (PLAVIX) tablet 75 mg  75 mg Oral Q breakfast Gaspar Bidding, DO      . enalapril (VASOTEC) tablet 10 mg  10 mg Oral Daily Shelly Flatten, MD   10 mg at 04/25/11 0950  . enoxaparin (LOVENOX) injection 40 mg  40 mg Subcutaneous Q24H W Ashley Royalty., MD   40 mg at 04/24/11 2301  . feeding supplement (GLUCERNA SHAKE) liquid 237 mL  237 mL Oral BID BM Alger Memos, RD   237 mL at 04/25/11 0952  . fentaNYL (SUBLIMAZE) injection 25-100 mcg  25-100 mcg Intravenous Q2H PRN Billy Fischer, MD   100 mcg at 04/17/11 1937  . furosemide (LASIX) injection 80 mg  80 mg Intravenous Once  Shelly Flatten, MD   80 mg at 04/24/11 1732  . furosemide (LASIX) tablet 20 mg  20 mg Oral Daily Shelly Flatten, MD   20 mg at 04/25/11 0943  . guaiFENesin (MUCINEX) 12 hr tablet 1,200 mg  1,200 mg Oral BID Leighton Roach McDiarmid, MD   1,200 mg at 04/25/11 0943  . insulin aspart (novoLOG) injection 0-15 Units  0-15 Units Subcutaneous TID WC Marena Chancy, MD   2 Units at 04/25/11 0749  . insulin glargine (LANTUS) injection 15 Units  15 Units Subcutaneous QHS Shelly Flatten, MD   15 Units at 04/24/11 2153  . loperamide (IMODIUM) capsule 2 mg  2 mg Oral BID Shelly Flatten, MD   2 mg at 04/25/11 [phone]  . metoprolol tartrate (LOPRESSOR) tablet 25 mg  25 mg Oral BID Carney Living, MD   25 mg at 04/25/11 0949  . Muscle Rub CREA   Topical PRN Marena Chancy, MD      . pantoprazole (PROTONIX) EC tablet 40 mg  40 mg Oral Q1200 Carney Living, MD   40 mg at 04/24/11 1731  . phenol (CHLORASEPTIC) mouth spray 1 spray  1 spray Mouth/Throat PRN Demetria Pore, MD   1 spray at 04/23/11 0156  . psyllium (HYDROCIL/METAMUCIL) packet 1 packet  1 packet Oral PRN Shelly Flatten, MD      . simvastatin (ZOCOR) tablet 40 mg  40 mg Oral q1800 Renold Don, MD   40 mg at 04/24/11 1853  . tiotropium (SPIRIVA) inhalation capsule 18 mcg  18 mcg Inhalation Daily Leighton Roach McDiarmid, MD   18 mcg at 04/24/11 1156  . traMADol (ULTRAM) tablet 50 mg  50 mg Oral Q6H PRN Carney Living, MD   50 mg at 04/24/11 0111  . traMADol (ULTRAM) tablet 50 mg  50 mg Oral Q6H PRN Marena Chancy, MD      . DISCONTD: 0.9 %  sodium chloride infusion   Intravenous Continuous W Ashley Royalty., MD 50 mL/hr at 04/24/11 0030    . DISCONTD: 0.9 %  sodium chloride infusion  1 mL/kg/hr Intravenous Continuous W Ashley Royalty., MD   1 mL/kg/hr at 04/24/11 1000  . DISCONTD: acetaminophen (TYLENOL) tablet 650 mg  650 mg Oral Q6H PRN Renold Don, MD   650 mg at 04/23/11 1248  . DISCONTD: enoxaparin (LOVENOX) injection 40 mg  40 mg Subcutaneous Q24H Anh P Pham, PHARMD   40 mg at 04/23/11 1452  . DISCONTD: ketorolac (TORADOL) 30 MG/ML injection 30 mg  30 mg Intravenous Q6H PRN Shelly Flatten, MD   30 mg at 04/22/11 1330  . DISCONTD: metoprolol tartrate (LOPRESSOR) 25 mg/10 mL oral suspension 25 mg  25 mg Oral Q12H Renold Don, MD   25 mg at 04/24/11 1109  . DISCONTD: pantoprazole sodium (PROTONIX) 40 mg/20 mL oral suspension 40 mg  40 mg Oral Q24H Renold Don, MD   40 mg at 04/23/11 1729    PE: Gen: NAD  HEENT: AT/[place], MMM CV: RRR, no m/r/g  Res: Coarse crackles in posterior L lower lung field; normal effort. on 6L [place]  Musc: No tenderness to palpation in the shoulder/upper back/neck region RUE:AVWU non-tender  Neuro: CN grossly intact  Labs/Studies:  CBG (last 3)   Basename 04/25/11 0733 04/24/11 2001 04/24/11 1644  GLUCAP 141* 182* 207*   Assessment/Plan:  71yo male w/ severe pneumonia and sepsis requiring 6 day stay in ICU including intubation, sent to floor yesterday.  1. Pneumonia: Extubated Saturday. Still  on 6L [place]. No desaturations while on O2 overnight.  Respiratory status improved this am after continued diuresis and ABX. Net output of 2.9L yesterday  (4.6L since admission). Levaquin stopped yesterday. Recommend f/u film in approximately 4 wks after DC for pneumonia and previously noted curvlinear density noted.  - Continue Lasix 20 Qday  - F/u output  - Wean O2 as tolerated  - continue incentive spirometer   2. NSTEMI with 3 vessel CAD: No current CP complaints. No events since transfer to telemetry. Cardiology following; recommend CABG - CVTS consult pending . Appreciated input. Started on Plavix as not great candidate for sx during this hosp - - Continue lopressor 25BID, ASA, Zocor  - Enalapirl to 10mg  Qday  - Consult for future CABG - Plavix 75mg  po q am  3. DM: Carb mod diet. Well controlled. Will start home glipizide, lantus, and metformin at D/c - continue SSI  - Restart home Lantus 15 QHS   4. Prophylaxis:  - SCD  - Lovenox  - Protonix  - IS  5. Musculoskeletal: no reported pain today - Continue PT/OT  - Continue Toradol 30mg  IV Q6 PRN  - Heating pad / Ice pack PRN   6. FEN/GI: Protein calorie malnutrition. Nutrition following. Now on Glucerna. Urinary protein as above. Low albumin likely from protein calorie malnutrition. Hyponatremia resolved.  - Continue Imodium, and probiotics  - KVO  - Urine protein level  - f/u Nutrition recs   7. Social: Pt agreeable to going to SNF for short term rehab. Awaiting placement. FL2 signed. CSW following. OT/PT following  - Continue CSW  - PT/OT   8. Disposition: To SNF pending CVTS consult  Signed: Gaspar Bidding, DO Family Medicine Resident PGY-1 [phone] 04/25/2011 10:08 AM

## 2011-04-25 NOTE — Discharge Summary (Signed)
I have seen and examined this patient. I have discussed with Dr Rigby.  I agree with their findings and plans as documented in their discharge note for today. 

## 2011-04-25 NOTE — Consult Note (Signed)
Reason for Consult: 3 vessel CAD  Referring Physician: Viann Fish, MD  Daniel Tyler is an 71 y.o. male.  HPI: 71 yo WM with history of diabetes and low EF. Poor historian. Admitted with pneumonia/ sepsis 04/14/11. He says he fell and had a "heart attack" and denies CP or SOB prior to fall. Per H&P on admission he had a 4 day history of progressive cough and shortness of breath. He required intubation, but was weaned and extubated. He r/i for NQWMI during acute phase of illness. Yesterday he had cardiac catheterization where he was found to have severe 3 vessel CAD, with impaired LV function (EF 40% with inferior hypokinesis.  Past Medical History  Diagnosis Date  . Diabetes mellitus   . Enlarged heart     History reviewed. No pertinent past surgical history.  History reviewed. No pertinent family history.  Social History:  reports that he has never smoked. He does not have any smokeless tobacco history on file. He reports that he does not drink alcohol or use illicit drugs.  Allergies: No Known Allergies  Medications:  I have reviewed the patient's current medications. Prior to Admission:  Prescriptions prior to admission  Medication Sig Dispense Refill  . acetaminophen (TYLENOL) 500 MG tablet Take 1,000 mg by mouth every 6 (six) hours as needed. For pain      . glipiZIDE (GLUCOTROL) 10 MG tablet Take 10 mg by mouth 2 (two) times daily before a meal.      . insulin glargine (LANTUS) 100 UNIT/ML injection Inject 15 Units into the skin at bedtime.      . metFORMIN (GLUCOPHAGE) 1000 MG tablet Take 1,000 mg by mouth 2 (two) times daily with a meal.      . DISCONTD: aspirin 325 MG tablet Take 975 mg by mouth every 6 (six) hours as needed. For pain        Results for orders placed during the hospital encounter of 04/14/11 (from the past 48 hour(s))  GLUCOSE, CAPILLARY     Status: Abnormal   Collection Time   04/23/11  4:27 PM      Component Value Range Comment   Glucose-Capillary 197  (*) 70 - 99 (mg/dL)   GLUCOSE, CAPILLARY     Status: Abnormal   Collection Time   04/23/11  9:03 PM      Component Value Range Comment   Glucose-Capillary 209 (*) 70 - 99 (mg/dL)   PROTIME-INR     Status: Normal   Collection Time   04/24/11  6:05 AM      Component Value Range Comment   Prothrombin Time 13.9  11.6 - 15.2 (seconds)    INR 1.05  0.00 - 1.49    GLUCOSE, CAPILLARY     Status: Abnormal   Collection Time   04/24/11  7:23 AM      Component Value Range Comment   Glucose-Capillary 130 (*) 70 - 99 (mg/dL)   GLUCOSE, CAPILLARY     Status: Abnormal   Collection Time   04/24/11 11:32 AM      Component Value Range Comment   Glucose-Capillary 146 (*) 70 - 99 (mg/dL)   CBC     Status: Abnormal   Collection Time   04/24/11 12:39 PM      Component Value Range Comment   WBC 12.7 (*) 4.0 - 10.5 (K/uL)    RBC 3.66 (*) 4.22 - 5.81 (MIL/uL)    Hemoglobin 10.6 (*) 13.0 - 17.0 (g/dL)    HCT 16.1 (*)  39.0 - 52.0 (%)    MCV 85.8  78.0 - 100.0 (fL)    MCH 29.0  26.0 - 34.0 (pg)    MCHC 33.8  30.0 - 36.0 (g/dL)    RDW 16.1  09.6 - 04.5 (%)    Platelets 635 (*) 150 - 400 (K/uL)   CREATININE, SERUM     Status: Normal   Collection Time   04/24/11 12:39 PM      Component Value Range Comment   Creatinine, Ser 0.60  0.50 - 1.35 (mg/dL)    GFR calc non Af Amer >90  >90 (mL/min)    GFR calc Af Amer >90  >90 (mL/min)   GLUCOSE, CAPILLARY     Status: Abnormal   Collection Time   04/24/11  4:44 PM      Component Value Range Comment   Glucose-Capillary 207 (*) 70 - 99 (mg/dL)   GLUCOSE, CAPILLARY     Status: Abnormal   Collection Time   04/24/11  8:01 PM      Component Value Range Comment   Glucose-Capillary 182 (*) 70 - 99 (mg/dL)    Comment 1 Notify RN     GLUCOSE, CAPILLARY     Status: Abnormal   Collection Time   04/25/11  7:33 AM      Component Value Range Comment   Glucose-Capillary 141 (*) 70 - 99 (mg/dL)   BASIC METABOLIC PANEL     Status: Abnormal   Collection Time   04/25/11  9:16 AM       Component Value Range Comment   Sodium 131 (*) 135 - 145 (mEq/L)    Potassium 3.5  3.5 - 5.1 (mEq/L)    Chloride 97  96 - 112 (mEq/L)    CO2 25  19 - 32 (mEq/L)    Glucose, Bld 185 (*) 70 - 99 (mg/dL)    BUN 12  6 - 23 (mg/dL)    Creatinine, Ser 4.09  0.50 - 1.35 (mg/dL)    Calcium 8.8  8.4 - 10.5 (mg/dL)    GFR calc non Af Amer >90  >90 (mL/min)    GFR calc Af Amer >90  >90 (mL/min)   GLUCOSE, CAPILLARY     Status: Abnormal   Collection Time   04/25/11 11:27 AM      Component Value Range Comment   Glucose-Capillary 185 (*) 70 - 99 (mg/dL)     Dg Chest 2 View  09/24/1912  *RADIOLOGY REPORT*  Clinical Data: Shortness of breath, weakness.  Effusion.  CHEST - 2 VIEW  Comparison: 04/22/2011  Findings: The lungs show diffuse interstitial changes, similar in appearance to the previous exam.  Right pleural effusion appears smaller.  No new consolidation identified.  Emphysematous changes are present.  Heart size is normal.  No definite edema. Visualized osseous structures have a normal appearance.  IMPRESSION:  1.  Chronic lung disease. 2.  Right pleural effusion is smaller.  Original Report Authenticated By: Patterson Hammersmith, [name]    Review of Systems  HENT: Positive for hearing loss.   Respiratory: Positive for cough, shortness of breath and wheezing.   Cardiovascular: Negative for chest pain and palpitations.  Genitourinary: Negative.   Musculoskeletal: Positive for myalgias and joint pain.       Shoulder pain ?angina  Neurological: Positive for weakness.       Fell, no LOC  Endo/Heme/Allergies: Does not bruise/bleed easily.   Blood pressure 116/66, pulse 81, temperature 99.6 F (37.6 C), temperature source Oral, resp.  rate 18, height 5\' 9"  (1.753 m), weight 136 lb 12.8 oz (62.052 kg), SpO2 94.00%. Physical Exam  Constitutional: He is oriented to person, place, and time. No distress.       thin  HENT:  Head: Normocephalic and atraumatic.  Eyes: EOM are normal. Pupils are  equal, round, and reactive to light.  Neck: Neck supple. No JVD present. No tracheal deviation present.  Cardiovascular: Normal rate and regular rhythm.  Exam reveals no friction rub.   Murmur (2/6 systolic) heard. Respiratory: Effort normal. He has no wheezes.       Rhonchi bilateral  GI: Soft. There is no tenderness.  Lymphadenopathy:    He has no cervical adenopathy.  Neurological: He is alert and oriented to person, place, and time.  Skin: Skin is warm and dry.    Assessment/Plan: 71 yo diabetic with severe 3 vessel CAD and impaired LV function.  He is recovering from pneumonia with sepsis and is not an operative candidate at this time. Agree with plan to treat medically for now. Also needs to improve nutritional status.   I will plan to see him the outpatient setting in 3-4 weeks to assess his status and see if he is an operative candidate  Daniel Tyler C 04/25/2011, 2:45 PM

## 2011-04-25 NOTE — Progress Notes (Signed)
CSW was informed by facility that they cannot accept a dc this late in the day however pt okay to dc tomorrow as long as he arrives at the facility by noon. CSW has faxed dc summary to facility and confirmed receipt. CSW will inform weekend CSW that pt will need to dc in the morning as dc summary has been completed for tomorrow. Pt and RN made aware.  Theresia Bough, MSW, [facility] [phone]

## 2011-04-26 MED ORDER — PHENOL 1.4 % MT LIQD: 1.0000 | OROMUCOSAL | Status: DC | PRN

## 2011-04-26 MED ORDER — PHENOL 1.4 % MT LIQD
1.0000 | OROMUCOSAL | Status: DC | PRN
  Filled 2011-04-26: qty 177

## 2011-04-26 NOTE — Discharge Summary (Signed)
I discussed with Dr Oh [place].  I agree with their plans documented in their  Note for today.  

## 2011-04-26 NOTE — Progress Notes (Signed)
Subjective: (For Dr. Donnie Aho) About to go home. Feels well. No CP.   Objective:  Vital Signs in the last 24 hours: Temp:  [97.5 F (36.4 C)-98.4 F (36.9 C)] 97.5 F (36.4 C) (03/02 0500) Pulse Rate:  [73-87] 78  (03/02 0944) Resp:  [16-20] 18  (03/02 0500) BP: (90-129)/(50-57) 129/57 mmHg (03/02 0939) SpO2:  [92 %-98 %] 96 % (03/02 0907) Weight:  [62.2 kg (137 lb 2 oz)] 62.2 kg (137 lb 2 oz) (03/02 0500)  Intake/Output from previous day: 03/01 0701 - 03/02 0700 In: 480 [P.O.:480] Out: 2500 [Urine:2500]   Physical Exam: General: Well developed, well nourished, in no acute distress. Head:  Normocephalic and atraumatic. Lungs: Clear to auscultation and percussion. Heart: Normal S1 and S2.  2/6 SM, rubs or gallops.  Pulses: Pulses normal in all 4 extremities. Abdomen: soft, non-tender, positive bowel sounds. Extremities: No clubbing or cyanosis. No edema. Neurologic: Alert and oriented x 3.    Lab Results:  Basename 04/24/11 1239  WBC 12.7*  HGB 10.6*  PLT 635*    Basename 04/25/11 0916 04/24/11 1239  NA 131* --  K 3.5 --  CL 97 --  CO2 25 --  GLUCOSE 185* --  BUN 12 --  CREATININE 0.66 0.60   Imaging: Dg Chest 2 View  04/25/2011  *RADIOLOGY REPORT*  Clinical Data: Shortness of breath, weakness.  Effusion.  CHEST - 2 VIEW  Comparison: 04/22/2011  Findings: The lungs show diffuse interstitial changes, similar in appearance to the previous exam.  Right pleural effusion appears smaller.  No new consolidation identified.  Emphysematous changes are present.  Heart size is normal.  No definite edema. Visualized osseous structures have a normal appearance.  IMPRESSION:  1.  Chronic lung disease. 2.  Right pleural effusion is smaller.  Original Report Authenticated By: Patterson Hammersmith, [name]   Telemetry: NSR Personally viewed.   Cardiac Studies:  3 V CAD  Assessment/Plan:   Severe CAD - 3 vessel. Awaiting CABG. Will need to be reevaluated after completion of abx and  pneumonia runs its course. Dr. Dorris Fetch has seen. Dr. Donnie Aho is his cardiologist.   Will need to stop Plavix prior to surgery once surgery is scheduled. Continue for now.   HTN - cont. meds  HL - simvastatin.   Daniel Tyler 04/26/2011, 9:57 AM

## 2011-04-26 NOTE — Progress Notes (Signed)
Patient is stable, d/c to Skilled nursing facility via care link.

## 2011-04-26 NOTE — Progress Notes (Signed)
Pt to transfer to [facility] today via [facility]. Pt, pt's friend [name], and SNF aware of d/c. No other CSW needs reported or noted. CSW signing off.  Dellie Burns, MSW, [facility] [phone] (w/e cover)

## 2011-04-28 LAB — GLUCOSE, CAPILLARY: Glucose-Capillary: 127 mg/dL — ABNORMAL HIGH (ref 70–99)

## 2011-05-22 ENCOUNTER — Encounter: Payer: Medicare Other | Admitting: Thoracic Surgery (Cardiothoracic Vascular Surgery)

## 2011-05-30 ENCOUNTER — Telehealth: Payer: Self-pay

## 2011-05-30 NOTE — Telephone Encounter (Signed)
Elita Boone, [facility] home [facility] RN, called to report pt has been in hosp w/resp failure and MI, then in [facility] rehab for 3 weeks. He has been home w/HH RN since 05/21/11. She is calling bc pt's BS have been consistently running high betw 197-266. He was released from hosp on Glipizide 10 BID, Metformin 1000 BID and 15 units of Lantus Qhs. Pt states he was on the same meds bf hosp, but [facility] reports that at one time Dr Hal Hope had Rxd 15 units of lantus BID bc that is what is on label. Pt states that she had decreased it to Qhs since. Pt plans to see cardiologist on Wed and then to f/up w/Dr Hal Hope, but didn't know if we wanted to increase any DM meds bf then. Please advise.

## 2011-05-30 NOTE — Telephone Encounter (Signed)
Patient can slowly increase his Lantus by 2 units qhs each night until blood sugar controlled, then follow up as directed.  Daniel Tyler

## 2011-05-31 NOTE — Telephone Encounter (Signed)
[facility] notified our advice.

## 2011-06-05 ENCOUNTER — Other Ambulatory Visit: Payer: Self-pay | Admitting: Thoracic Surgery (Cardiothoracic Vascular Surgery)

## 2011-06-05 ENCOUNTER — Ambulatory Visit: Payer: Medicare Other | Admitting: Family Medicine

## 2011-06-05 ENCOUNTER — Encounter: Payer: Self-pay | Admitting: Thoracic Surgery (Cardiothoracic Vascular Surgery)

## 2011-06-05 ENCOUNTER — Ambulatory Visit (INDEPENDENT_AMBULATORY_CARE_PROVIDER_SITE_OTHER): Payer: Medicare Other | Admitting: Thoracic Surgery (Cardiothoracic Vascular Surgery)

## 2011-06-05 VITALS — BP 132/70 | HR 76 | Temp 97.7°F | Resp 18 | Ht 67.5 in | Wt 138.0 lb

## 2011-06-05 VITALS — BP 122/66 | HR 71 | Resp 20 | Ht 67.0 in | Wt 139.0 lb

## 2011-06-05 DIAGNOSIS — I2510 Atherosclerotic heart disease of native coronary artery without angina pectoris: Secondary | ICD-10-CM

## 2011-06-05 NOTE — Progress Notes (Signed)
  Subjective:    Patient ID: Daniel Tyler, male    DOB: 23-May-1940, 71 y.o.   MRN: [id number]  HPI Patient had left when I went in to see him.    Review of Systems     Objective:   Physical Exam        Assessment & Plan:

## 2011-06-05 NOTE — Progress Notes (Signed)
PCP is Dois Davenport., MD, MD Referring Provider is Othella Boyer, MD  Chief Complaint  Patient presents with  . Coronary Artery Disease    I/P consult, Further discuss surgery, referral from Dr. Donnie Aho    HPI: 71 year old white male who recently was found to have three-vessel coronary disease and impaired left ventricular function. Now returns to discuss possible coronary bypass grafting.  Daniel Tyler was hospitalized in late February and early March after a syncopal spell. Daniel Tyler has little recall of the incident. Daniel Tyler was hospitalized with a diagnosis of pneumonia and sepsis. Daniel Tyler did rule in for a non-Q-wave MI during the acute phase of his illness. After Daniel Tyler had recovered to some extent Daniel Tyler underwent cardiac catheterization was found to have severe three-vessel coronary disease and impaired left ventricular function. Daniel Tyler also had an echo during his hospitalization which showed mild to moderate mitral regurgitation.  Daniel Tyler states that since his discharge from the hospital Daniel Tyler has not had any chest pain, tightness, pressure. Daniel Tyler's not had any shortness of breath either at rest or with exertion. His friend who accompanied him reminded him that Daniel Tyler been told previously that Daniel Tyler had an enlarged heart, but Daniel Tyler has no recollection of any prior cardiac events.  Past Medical History  Diagnosis Date  . Diabetes mellitus   . Enlarged heart     No past surgical history on file.  No family history on file.  Social History History  Substance Use Topics  . Smoking status: Former Smoker    Types: Cigarettes    Quit date: 02/24/1978  . Smokeless tobacco: Never Used  . Alcohol Use: No    Current Outpatient Prescriptions  Medication Sig Dispense Refill  . budesonide-formoterol (SYMBICORT) 160-4.5 MCG/ACT inhaler Inhale 2 puffs into the lungs 2 (two) times daily.      . clopidogrel (PLAVIX) 75 MG tablet Take 1 tablet (75 mg total) by mouth daily with breakfast.      . enalapril (VASOTEC) 10 MG  tablet Take 1 tablet (10 mg total) by mouth daily.      . furosemide (LASIX) 20 MG tablet Take 1 tablet (20 mg total) by mouth daily.  30 tablet    . glipiZIDE (GLUCOTROL) 10 MG tablet Take 10 mg by mouth 2 (two) times daily before a meal.      . insulin glargine (LANTUS) 100 UNIT/ML injection Inject 17 Units into the skin at bedtime.       . Menthol-Methyl Salicylate (MUSCLE RUB) 10-15 % CREA Apply 1 application topically as needed.      . metFORMIN (GLUCOPHAGE) 1000 MG tablet Take 1,000 mg by mouth 2 (two) times daily with a meal.      . metoprolol tartrate (LOPRESSOR) 25 MG tablet Take 1 tablet (25 mg total) by mouth 2 (two) times daily.      . polysaccharide iron (NIFEREX) 150 MG CAPS capsule Take 150 mg by mouth daily.      . psyllium (HYDROCIL/METAMUCIL) 95 % PACK Take 1 packet by mouth as needed.  56 each    . simvastatin (ZOCOR) 40 MG tablet Take 1 tablet (40 mg total) by mouth daily at 6 PM.  30 tablet    . tiotropium (SPIRIVA) 18 MCG inhalation capsule Place 1 capsule (18 mcg total) into inhaler and inhale daily.  30 capsule      No Known Allergies  Review of Systems  Constitutional: Negative for fever and chills.  HENT: Negative.   Respiratory: Positive for cough and  shortness of breath (none since discharged). Negative for chest tightness.   Cardiovascular: Negative for chest pain, palpitations and leg swelling.  Gastrointestinal: Negative.   Genitourinary: Negative.   Neurological: Positive for dizziness and syncope.  Hematological: Bruises/bleeds easily.  All other systems reviewed and are negative.    BP 122/66  Pulse 71  Resp 20  Ht 5\' 7"  (1.702 m)  Wt 139 lb (63.05 kg)  BMI 21.77 kg/m2  SpO2 93% Physical Exam  Vitals reviewed. Constitutional: Daniel Tyler is oriented to person, place, and time. Daniel Tyler appears well-developed and well-nourished. No distress.  HENT:  Head: Normocephalic and atraumatic.  Eyes: EOM are normal. Pupils are equal, round, and reactive to light.    Neck: Neck supple. No thyromegaly present.  Cardiovascular: Normal rate, regular rhythm, normal heart sounds and intact distal pulses.  Exam reveals no gallop and no friction rub.   No murmur heard. Pulmonary/Chest: Breath sounds normal. Daniel Tyler has no wheezes. Daniel Tyler has no rales.  Abdominal: Soft. There is no tenderness.  Musculoskeletal: Daniel Tyler exhibits no edema.  Lymphadenopathy:    Daniel Tyler has no cervical adenopathy.  Neurological: Daniel Tyler is alert and oriented to person, place, and time. No cranial nerve deficit.  Psychiatric:       Flat affect     Diagnostic Tests: CXR 3/1 - chronic lung disease, decreased right pleural effusion Cath- 3 vessel CAD, EF 30-35% Echo- EF 30- 35%, mild to moderate MR  Impression: 71 year old gentleman who had a non-Q-wave MI in the setting of pneumonia and sepsis about a month ago. Daniel Tyler has been symptom free since discharge, but was found to have three-vessel coronary disease with impaired left ventricular function during his hospitalization. Coronary artery bypass grafting is indicated for survival benefit in the setting of three-vessel disease with impaired ventricular function. Daniel Tyler does need carotid duplex and pulmonary function testing before making a final determination that Daniel Tyler is a candidate for surgery. Daniel Tyler such a poor historian is really hard to get a handle on how active Daniel Tyler is.  I did discuss with Daniel Tyler and his friends the general nature of coronary bypass grafting, need for general anesthesia, incision to be used, use of the heart-lung machine. I also discussed with them in general terms the risks of surgery including death, stroke, MI, DVT PE, infections, bleeding, possible need for transfusions, other organ system dysfunction such as respiratory failure or renal failure or gastrointestinal complications.  Daniel Tyler is not sure if Daniel Tyler was to proceed with coronary bypass grafting, and wishes to have more time to think it over prior to making the incision. Daniel Tyler has an appointment to  see Dr. Hal Hope this afternoon.  Plan: We'll schedule carotid duplex and PFT  I will see him back in about 3 weeks to further discuss possible CABG

## 2011-06-07 ENCOUNTER — Ambulatory Visit (INDEPENDENT_AMBULATORY_CARE_PROVIDER_SITE_OTHER): Payer: Medicare Other | Admitting: Family Medicine

## 2011-06-07 VITALS — BP 116/66 | HR 65 | Temp 98.2°F | Resp 16 | Ht 68.5 in | Wt 139.0 lb

## 2011-06-07 DIAGNOSIS — F329 Major depressive disorder, single episode, unspecified: Secondary | ICD-10-CM

## 2011-06-07 DIAGNOSIS — I10 Essential (primary) hypertension: Secondary | ICD-10-CM

## 2011-06-07 DIAGNOSIS — I2510 Atherosclerotic heart disease of native coronary artery without angina pectoris: Secondary | ICD-10-CM

## 2011-06-07 DIAGNOSIS — F32A Depression, unspecified: Secondary | ICD-10-CM

## 2011-06-07 DIAGNOSIS — J449 Chronic obstructive pulmonary disease, unspecified: Secondary | ICD-10-CM

## 2011-06-07 DIAGNOSIS — E119 Type 2 diabetes mellitus without complications: Secondary | ICD-10-CM

## 2011-06-07 DIAGNOSIS — E785 Hyperlipidemia, unspecified: Secondary | ICD-10-CM | POA: Insufficient documentation

## 2011-06-07 MED ORDER — FLUOXETINE HCL 10 MG PO CAPS: ORAL_CAPSULE | ORAL | Status: DC

## 2011-06-07 MED ORDER — SIMVASTATIN 40 MG PO TABS: 40.0000 mg | ORAL_TABLET | Freq: Every day | ORAL | Status: DC

## 2011-06-07 MED ORDER — FUROSEMIDE 20 MG PO TABS: 20.0000 mg | ORAL_TABLET | Freq: Every day | ORAL | Status: DC

## 2011-06-07 MED ORDER — BUDESONIDE-FORMOTEROL FUMARATE 160-4.5 MCG/ACT IN AERO: 2.0000 | INHALATION_SPRAY | Freq: Two times a day (BID) | RESPIRATORY_TRACT | Status: DC

## 2011-06-07 MED ORDER — ENALAPRIL MALEATE 10 MG PO TABS: 10.0000 mg | ORAL_TABLET | Freq: Every day | ORAL | Status: DC

## 2011-06-07 MED ORDER — ALPRAZOLAM 0.25 MG PO TABS: 0.2500 mg | ORAL_TABLET | Freq: Every evening | ORAL | Status: AC | PRN

## 2011-06-07 MED ORDER — GLIPIZIDE 10 MG PO TABS: 10.0000 mg | ORAL_TABLET | Freq: Two times a day (BID) | ORAL | Status: DC

## 2011-06-07 MED ORDER — TIOTROPIUM BROMIDE MONOHYDRATE 18 MCG IN CAPS: 18.0000 ug | ORAL_CAPSULE | Freq: Every day | RESPIRATORY_TRACT | Status: DC

## 2011-06-07 MED ORDER — METFORMIN HCL 1000 MG PO TABS: 1000.0000 mg | ORAL_TABLET | Freq: Two times a day (BID) | ORAL | Status: DC

## 2011-06-07 MED ORDER — METOPROLOL TARTRATE 25 MG PO TABS: 25.0000 mg | ORAL_TABLET | Freq: Two times a day (BID) | ORAL | Status: DC

## 2011-06-07 MED ORDER — CLOPIDOGREL BISULFATE 75 MG PO TABS: 75.0000 mg | ORAL_TABLET | Freq: Every day | ORAL | Status: DC

## 2011-06-07 NOTE — Progress Notes (Signed)
  Subjective:    Patient ID: Daniel Tyler, male    DOB: [birth year], 71 y.o.   MRN: [id number]  HPI Patient presents in follow up of hospitalization for AMI and pneumonia.   Prolong hospitalization(2 weeks) then spent 3 1/2 weeks at [facility] home. CABG recommended however patient wanted to speak with me prior to agreeing to procedure.   Review of Systems     Objective:   Physical Exam  Constitutional: He is oriented to person, place, and time. He appears well-developed.  Neck: Neck supple. No JVD present. Carotid bruit is not present. No thyromegaly present.  Cardiovascular: Normal rate, regular rhythm and normal heart sounds.   Pulmonary/Chest: Effort normal and breath sounds normal.  Abdominal: Soft. Bowel sounds are normal.  Musculoskeletal: Normal range of motion.  Neurological: He is alert and oriented to person, place, and time.  Skin: Skin is warm.       Neuropathic changes to (B) LE without ulceration          Assessment & Plan:   1. DM type 2 (diabetes mellitus, type 2)  Lipid panel, POCT glycosylated hemoglobin (Hb A1C), TSH, glipiZIDE (GLUCOTROL) 10 MG tablet, metFORMIN (GLUCOPHAGE) 1000 MG tablet  2. CAD (coronary artery disease)  Lipid panel, clopidogrel (PLAVIX) 75 MG tablet, metoprolol tartrate (LOPRESSOR) 25 MG tablet, enalapril (VASOTEC) 10 MG tablet, furosemide (LASIX) 20 MG tablet  3. Dyslipidemia  Lipid panel, simvastatin (ZOCOR) 40 MG tablet  4. Depression  FLUoxetine (PROZAC) 10 MG capsule, ALPRAZolam (XANAX) 0.25 MG tablet  5. HTN (hypertension)  Comprehensive metabolic panel, TSH  6. COPD (chronic obstructive pulmonary disease)  budesonide-formoterol (SYMBICORT) 160-4.5 MCG/ACT inhaler, tiotropium (SPIRIVA) 18 MCG inhalation capsule

## 2011-06-07 NOTE — Patient Instructions (Signed)
Take your fluoxetine only every other day  Alprazolam- 1 tablet at nighttime to help with sleep

## 2011-06-08 LAB — LIPID PANEL
Cholesterol: 148 mg/dL (ref 0–200)
HDL: 44 mg/dL
LDL Cholesterol: 82 mg/dL (ref 0–99)
Total CHOL/HDL Ratio: 3.4 ratio
Triglycerides: 112 mg/dL
VLDL: 22 mg/dL (ref 0–40)

## 2011-06-08 LAB — COMPREHENSIVE METABOLIC PANEL
ALT: 9 U/L (ref 0–53)
Alkaline Phosphatase: 44 U/L (ref 39–117)
Sodium: 133 mEq/L — ABNORMAL LOW (ref 135–145)
Total Bilirubin: 0.3 mg/dL (ref 0.3–1.2)
Total Protein: 7.8 g/dL (ref 6.0–8.3)

## 2011-06-09 ENCOUNTER — Inpatient Hospital Stay (HOSPITAL_COMMUNITY)
Admission: RE | Admit: 2011-06-09 | Discharge: 2011-06-09 | Disposition: A | Discharge status: Home / Self Care | Payer: Medicare Other | Source: Ambulatory Visit

## 2011-06-09 ENCOUNTER — Other Ambulatory Visit (HOSPITAL_COMMUNITY): Payer: Self-pay

## 2011-06-09 ENCOUNTER — Inpatient Hospital Stay (HOSPITAL_COMMUNITY)
Admission: RE | Admit: 2011-06-09 | Discharge: 2011-06-09 | Disposition: A | Discharge status: Home / Self Care | Payer: Medicare Other | Source: Ambulatory Visit | Attending: Thoracic Surgery (Cardiothoracic Vascular Surgery) | Admitting: Thoracic Surgery (Cardiothoracic Vascular Surgery)

## 2011-06-09 ENCOUNTER — Telehealth: Payer: Self-pay

## 2011-06-09 ENCOUNTER — Ambulatory Visit (HOSPITAL_COMMUNITY)
Admission: RE | Admit: 2011-06-09 | Discharge: 2011-06-09 | Disposition: A | Discharge status: Home / Self Care | Payer: Medicare Other | Source: Ambulatory Visit | Attending: Thoracic Surgery (Cardiothoracic Vascular Surgery) | Admitting: Thoracic Surgery (Cardiothoracic Vascular Surgery)

## 2011-06-09 ENCOUNTER — Other Ambulatory Visit (HOSPITAL_COMMUNITY): Payer: Self-pay | Admitting: Respiratory Therapy

## 2011-06-09 DIAGNOSIS — Z01811 Encounter for preprocedural respiratory examination: Secondary | ICD-10-CM | POA: Insufficient documentation

## 2011-06-09 DIAGNOSIS — E119 Type 2 diabetes mellitus without complications: Secondary | ICD-10-CM | POA: Insufficient documentation

## 2011-06-09 DIAGNOSIS — Z01818 Encounter for other preprocedural examination: Secondary | ICD-10-CM | POA: Insufficient documentation

## 2011-06-09 DIAGNOSIS — Z01812 Encounter for preprocedural laboratory examination: Secondary | ICD-10-CM | POA: Insufficient documentation

## 2011-06-09 DIAGNOSIS — R0989 Other specified symptoms and signs involving the circulatory and respiratory systems: Secondary | ICD-10-CM

## 2011-06-09 DIAGNOSIS — I2510 Atherosclerotic heart disease of native coronary artery without angina pectoris: Secondary | ICD-10-CM | POA: Insufficient documentation

## 2011-06-09 LAB — BLOOD GAS, ARTERIAL
Acid-base deficit: 2.7 mmol/L — ABNORMAL HIGH (ref 0.0–2.0)
Drawn by: 24485
TCO2: 22.1 mmol/L (ref 0–100)
pCO2 arterial: 33.2 mmHg — ABNORMAL LOW (ref 35.0–45.0)
pH, Arterial: 7.419 (ref 7.350–7.450)
pO2, Arterial: 86.8 mmHg (ref 80.0–100.0)

## 2011-06-09 LAB — PULMONARY FUNCTION TEST

## 2011-06-09 MED ORDER — ALBUTEROL SULFATE (5 MG/ML) 0.5% IN NEBU
2.5000 mg | INHALATION_SOLUTION | Freq: Once | RESPIRATORY_TRACT | Status: AC
  Administered 2011-06-09: 2.5 mg via RESPIRATORY_TRACT

## 2011-06-09 NOTE — Telephone Encounter (Signed)
Daniel Tyler from [facility] asking for [facility] w/VO for pt's lancets and test strips for QD testing. Called back and gave order for #100 lancets and test strips for pt w/ 1 year RFs per protocol/ Dr Nadyne Coombes.

## 2011-06-09 NOTE — Progress Notes (Signed)
VASCULAR LAB PRELIMINARY  PRELIMINARY  PRELIMINARY  PRELIMINARY  Carotid duplex has been completed.    Preliminary report: Moderate to severe plaque in right ICA with > than 80 % stenosis of the mid ICA.  Moderate plaque in the left ICA with a 40 to 59% left ICA stenosis.  Vertebral artery flow is antegrade.  Report faxed to [facility]  office and called to bring to Dee's attention.  Vanna Scotland, 06/09/2011, 2:54 PM

## 2011-06-13 ENCOUNTER — Telehealth: Payer: Self-pay

## 2011-06-13 NOTE — Telephone Encounter (Signed)
Pt calling in to see if Dr Hal Hope has called in his medication he states she said that she would call it in, and wants her to know he is about out of his medication.

## 2011-06-13 NOTE — Telephone Encounter (Signed)
Spoke with  Daniel Tyler and called Daniel Tyler to verify rx was sent.  Called Daniel Tyler back and let him know that meds were at pharmacy and ready for pick up.

## 2011-06-13 NOTE — Telephone Encounter (Signed)
Pt calling again an needs to know how many rx Dr Hal Hope called in, [facility] pharmacy states its only 3 rx pt states it should be 5 rx.Marland KitchenMarland Kitchen

## 2011-06-17 ENCOUNTER — Ambulatory Visit (INDEPENDENT_AMBULATORY_CARE_PROVIDER_SITE_OTHER): Payer: Medicare Other | Admitting: Family Medicine

## 2011-06-17 DIAGNOSIS — E119 Type 2 diabetes mellitus without complications: Secondary | ICD-10-CM

## 2011-06-17 DIAGNOSIS — E789 Disorder of lipoprotein metabolism, unspecified: Secondary | ICD-10-CM

## 2011-06-17 DIAGNOSIS — I2510 Atherosclerotic heart disease of native coronary artery without angina pectoris: Secondary | ICD-10-CM

## 2011-06-17 DIAGNOSIS — E785 Hyperlipidemia, unspecified: Secondary | ICD-10-CM

## 2011-06-17 DIAGNOSIS — I10 Essential (primary) hypertension: Secondary | ICD-10-CM

## 2011-06-17 MED ORDER — SIMVASTATIN 40 MG PO TABS: 40.0000 mg | ORAL_TABLET | Freq: Every day | ORAL | Status: DC

## 2011-06-17 MED ORDER — CLOPIDOGREL BISULFATE 75 MG PO TABS: 75.0000 mg | ORAL_TABLET | Freq: Every day | ORAL | Status: DC

## 2011-06-17 MED ORDER — ENALAPRIL MALEATE 10 MG PO TABS: 10.0000 mg | ORAL_TABLET | Freq: Every day | ORAL | Status: DC

## 2011-06-17 MED ORDER — METOPROLOL TARTRATE 25 MG PO TABS: 25.0000 mg | ORAL_TABLET | Freq: Two times a day (BID) | ORAL | Status: DC

## 2011-06-17 NOTE — Progress Notes (Signed)
[facility]:  Office Visit  Chief Complaint:  Chief Complaint  Patient presents with  . Medication Refill    HPI: Daniel Tyler is a 71 y.o. male who complains of: 1. HTN-90s/80s, compliant except ran out 3 days ago of meds, tried to get it refilled through various channels but had problems so now he is here. No SEs 2. DM-nightly sugars no hypoglycemia, range has been 226 at night. Overdue for eye exam. Denies neuropathy, skin lesions 3. Cholesterol-no SEs, compliant with meds. 4. Plavix for CAD    Past Medical History  Diagnosis Date  . Diabetes mellitus   . Enlarged heart   . Hyperlipidemia   . Hypertension   . Myocardial infarction   . Carotid stenosis    History reviewed. No pertinent past surgical history. History   Social History  . Marital Status: Single    Spouse Name: N/A    Number of Children: N/A  . Years of Education: N/A   Social History Main Topics  . Smoking status: Former Smoker -- 1.0 packs/day for 20 years    Types: Cigarettes    Quit date: 02/24/1978  . Smokeless tobacco: Never Used  . Alcohol Use: No  . Drug Use: No  . Sexually Active: None   Other Topics Concern  . None   Social History Narrative  . None   Family History  Problem Relation Age of Onset  . Heart disease Father   . Cancer Brother   . Diabetes Brother   . Diabetes Daughter    No Known Allergies Prior to Admission medications   Medication Sig Start Date End Date Taking? Authorizing Provider  ALPRAZolam (XANAX) 0.25 MG tablet Take 1 tablet (0.25 mg total) by mouth at bedtime as needed for sleep. 06/07/11 07/07/11 Yes Dois Davenport, MD  budesonide-formoterol [facility]) 160-4.5 MCG/ACT inhaler Inhale 2 puffs into the lungs 2 (two) times daily. 06/07/11  Yes Dois Davenport, MD  clopidogrel (PLAVIX) 75 MG tablet Take 1 tablet (75 mg total) by mouth daily with breakfast. 06/07/11 06/06/12 Yes Dois Davenport, MD  enalapril (VASOTEC) 10 MG tablet Take 1  tablet (10 mg total) by mouth daily. 06/07/11 06/06/12 Yes Dois Davenport, MD  FLUoxetine (PROZAC) 10 MG capsule Take 1 tablet qod 06/07/11  Yes Dois Davenport, MD  furosemide (LASIX) 20 MG tablet Take 1 tablet (20 mg total) by mouth daily. 06/07/11 06/06/12 Yes Dois Davenport, MD  glipiZIDE (GLUCOTROL) 10 MG tablet Take 1 tablet (10 mg total) by mouth 2 (two) times daily before a meal. 06/07/11  Yes Dois Davenport, MD  insulin glargine (LANTUS) 100 UNIT/ML injection Inject 17 Units into the skin at bedtime.    Yes Historical Provider, MD  metFORMIN (GLUCOPHAGE) 1000 MG tablet Take 1 tablet (1,000 mg total) by mouth 2 (two) times daily with a meal. 06/07/11  Yes Dois Davenport, MD  metoprolol tartrate (LOPRESSOR) 25 MG tablet Take 1 tablet (25 mg total) by mouth 2 (two) times daily. 06/07/11 06/06/12 Yes Dois Davenport, MD  polysaccharide iron (NIFEREX) 150 MG CAPS capsule Take 150 mg by mouth daily.   Yes Historical Provider, MD  simvastatin (ZOCOR) 40 MG tablet Take 1 tablet (40 mg total) by mouth daily at 6 PM. 06/07/11 06/06/12 Yes Dois Davenport, MD  tiotropium (SPIRIVA) 18 MCG inhalation capsule Place 1 capsule (18 mcg total) into inhaler and inhale daily. 06/07/11 06/06/12 Yes Dois Davenport, MD  Menthol-Methyl Salicylate (  MUSCLE RUB) 10-15 % CREA Apply 1 application topically as needed. 04/25/11   Andrena Mews, DO  psyllium (HYDROCIL/METAMUCIL) 95 % PACK Take 1 packet by mouth as needed. 04/25/11   Andrena Mews, DO     ROS: The patient denies fevers, chills, night sweats, unintentional weight loss, chest pain, palpitations, wheezing, dyspnea on exertion, nausea, vomiting, abdominal pain, dysuria, hematuria, melena, numbness, weakness, or tingling.  All other systems have been reviewed and were otherwise negative with the exception of those mentioned in the HPI and as above.    PHYSICAL EXAM: Filed Vitals:   06/17/11 1146  BP: 125/77  Pulse: 76  Temp: 98.3 F (36.8 C)  Resp: 16    Filed Vitals:   06/17/11 1146  Height: 5' 7.5" (1.715 m)  Weight: 143 lb (64.864 kg)   Body mass index is 22.07 kg/(m^2).  General: Alert, no acute distress HEENT:  Normocephalic, atraumatic, oropharynx patent.  Cardiovascular:  Regular rate and rhythm, no rubs murmurs or gallops.  No Carotid bruits, radial pulse intact. No pedal edema.  Respiratory: Clear to auscultation bilaterally.  No wheezes, rales, or rhonchi.  No cyanosis, no use of accessory musculature GI: No organomegaly, abdomen is soft and non-tender, positive bowel sounds.  No masses. Skin: No rashes. Neurologic: Facial musculature symmetric. Psychiatric: Patient is appropriate throughout our interaction. Lymphatic: No cervical lymphadenopathy Musculoskeletal: Gait intact.   LABS:  EKG/XRAY:   Primary read interpreted by Dr. Conley Rolls at [facility].   ASSESSMENT/PLAN: Encounter Diagnoses  Name Primary?  . Diabetes mellitus Yes  . HTN (hypertension)   . Hyperlipidemia   . CAD (coronary artery disease)   . Dyslipidemia    Patient was recently in hospital and rehab for PNA and has labs done.  Everything seems well controlled.  Refilled meds F/u in 3 months     Latacha Texeira PHUONG, DO 06/17/2011 12:29 PM

## 2011-06-18 ENCOUNTER — Other Ambulatory Visit: Payer: Self-pay | Admitting: Family Medicine

## 2011-06-18 ENCOUNTER — Encounter: Payer: Self-pay | Admitting: Vascular Surgery

## 2011-06-18 DIAGNOSIS — I10 Essential (primary) hypertension: Secondary | ICD-10-CM

## 2011-06-18 DIAGNOSIS — I2510 Atherosclerotic heart disease of native coronary artery without angina pectoris: Secondary | ICD-10-CM

## 2011-06-18 DIAGNOSIS — E785 Hyperlipidemia, unspecified: Secondary | ICD-10-CM

## 2011-06-18 MED ORDER — ENALAPRIL MALEATE 10 MG PO TABS: 10.0000 mg | ORAL_TABLET | Freq: Every day | ORAL | Status: DC

## 2011-06-18 MED ORDER — METOPROLOL TARTRATE 25 MG PO TABS: 25.0000 mg | ORAL_TABLET | Freq: Two times a day (BID) | ORAL | Status: DC

## 2011-06-18 MED ORDER — CLOPIDOGREL BISULFATE 75 MG PO TABS: 75.0000 mg | ORAL_TABLET | Freq: Every day | ORAL | Status: AC

## 2011-06-18 MED ORDER — SIMVASTATIN 40 MG PO TABS: 40.0000 mg | ORAL_TABLET | Freq: Every day | ORAL | Status: DC

## 2011-06-19 ENCOUNTER — Other Ambulatory Visit: Payer: Self-pay

## 2011-06-19 ENCOUNTER — Encounter: Payer: Self-pay | Admitting: Vascular Surgery

## 2011-06-19 ENCOUNTER — Ambulatory Visit (INDEPENDENT_AMBULATORY_CARE_PROVIDER_SITE_OTHER): Payer: Medicare Other | Admitting: Vascular Surgery

## 2011-06-19 VITALS — BP 128/58 | HR 75 | Temp 98.3°F | Ht 69.0 in | Wt 142.0 lb

## 2011-06-19 DIAGNOSIS — I6529 Occlusion and stenosis of unspecified carotid artery: Secondary | ICD-10-CM

## 2011-06-19 NOTE — Progress Notes (Signed)
History of Present Illness:  Patient is a 70 y.o. year old male who presents for evaluation of carotid stenosis.  The patient denies symptoms of TIA, amaurosis, or stroke.  The patient is currently on Plavix antiplatelet therapy.  The carotid stenosis was found on screening duplex ultrasound.  Other medical problems include coronary artery disease, diabetes, and depressed ejection fraction, hyperlipidemia, hypertension, prior history of tobacco abuse.  These are currently stable.  Dr. Hendrickson is currently planning coronary bypass grafting for three-vessel coronary disease  Past Medical History  Diagnosis Date  . Diabetes mellitus   . Enlarged heart   . Hyperlipidemia   . Hypertension   . Myocardial infarction   . Carotid stenosis     History reviewed. No pertinent past surgical history.   Social History History  Substance Use Topics  . Smoking status: Former Smoker -- 1.0 packs/day for 20 years    Types: Cigarettes    Quit date: 02/24/1978  . Smokeless tobacco: Never Used  . Alcohol Use: No    Family History Family History  Problem Relation Age of Onset  . Heart disease Father   . Cancer Brother   . Diabetes Brother   . Diabetes Daughter     Allergies  No Known Allergies   Current Outpatient Prescriptions  Medication Sig Dispense Refill  . ALPRAZolam (XANAX) 0.25 MG tablet Take 1 tablet (0.25 mg total) by mouth at bedtime as needed for sleep.  30 tablet  1  . budesonide-formoterol (SYMBICORT) 160-4.5 MCG/ACT inhaler Inhale 2 puffs into the lungs 2 (two) times daily.  1 Inhaler  2  . clopidogrel (PLAVIX) 75 MG tablet Take 1 tablet (75 mg total) by mouth daily with breakfast.  90 tablet  2  . enalapril (VASOTEC) 10 MG tablet Take 1 tablet (10 mg total) by mouth daily.  90 tablet  2  . FLUoxetine (PROZAC) 10 MG capsule Take 1 tablet qod  30 capsule  11  . furosemide (LASIX) 20 MG tablet Take 1 tablet (20 mg total) by mouth daily.  30 tablet  11  . glipiZIDE (GLUCOTROL)  10 MG tablet Take 1 tablet (10 mg total) by mouth 2 (two) times daily before a meal.  60 tablet  11  . insulin glargine (LANTUS) 100 UNIT/ML injection Inject 17 Units into the skin at bedtime.       . Menthol-Methyl Salicylate (MUSCLE RUB) 10-15 % CREA Apply 1 application topically as needed.      . metFORMIN (GLUCOPHAGE) 1000 MG tablet Take 1 tablet (1,000 mg total) by mouth 2 (two) times daily with a meal.  60 tablet  11  . metoprolol tartrate (LOPRESSOR) 25 MG tablet Take 1 tablet (25 mg total) by mouth 2 (two) times daily.  60 tablet  11  . simvastatin (ZOCOR) 40 MG tablet Take 1 tablet (40 mg total) by mouth daily at 6 PM.  90 tablet  2  . tiotropium (SPIRIVA) 18 MCG inhalation capsule Place 1 capsule (18 mcg total) into inhaler and inhale daily.  30 capsule    . VENTOLIN HFA 108 (90 BASE) MCG/ACT inhaler daily.      . polysaccharide iron (NIFEREX) 150 MG CAPS capsule Take 150 mg by mouth daily.      . psyllium (HYDROCIL/METAMUCIL) 95 % PACK Take 1 packet by mouth as needed.  56 each      ROS:   General:  No weight loss, Fever, chills  HEENT: No recent headaches, no nasal bleeding, no visual changes,   no sore throat  Neurologic: No dizziness, blackouts, seizures. No recent symptoms of stroke or mini- stroke. No recent episodes of slurred speech, or temporary blindness.  Cardiac: No recent episodes of chest pain/pressure, no shortness of breath at rest.  +shortness of breath with exertion.  Denies history of atrial fibrillation or irregular heartbeat  Vascular: No history of rest pain in feet.  No history of claudication.  No history of non-healing ulcer, No history of DVT   Pulmonary: No home oxygen, + productive cough, no hemoptysis,  some time wheezing  Musculoskeletal:  [ ] Arthritis, [ ] Low back pain,  [ ] Joint pain  Hematologic:No history of hypercoagulable state.  No history of easy bleeding.  No history of anemia  Gastrointestinal: No hematochezia or melena,  No  gastroesophageal reflux, no trouble swallowing  Urinary: [ ] chronic Kidney disease, [ ] on HD - [ ] MWF or [ ] TTHS, [ ] Burning with urination, [ ] Frequent urination, [ ] Difficulty urinating;   Skin: No rashes  Psychological: No history of anxiety,  No history of depression   Physical Examination  Filed Vitals:   06/19/11 1300  BP: 128/58  Pulse: 75  Temp: 98.3 F (36.8 C)  TempSrc: Oral  Height: 5' 9" (1.753 m)  Weight: 142 lb (64.411 kg)    Body mass index is 20.97 kg/(m^2).  General:  Alert and oriented, no acute distress HEENT: Normal Neck: No bruit or JVD Pulmonary: Clear to auscultation bilaterally Cardiac: Regular Rate and Rhythm without murmur Gastrointestinal: Soft, non-tender, non-distended, no mass Skin: No rash Extremity Pulses:  2+ radial, brachial, femoral, absent dorsalis pedis, posterior tibial pulses bilaterally Musculoskeletal: No deformity or edema  Neurologic: Upper and lower extremity motor 5/5 and symmetric  DATA: I reviewed his carotid duplex exam from [facility] dated 06/09/2011. This showed a high-grade carotid stenosis on the right side with a peak systolic velocity of 429 cm/s. His falls and a greater than 80% category. He also had a moderate left internal carotid artery stenosis. He had antegrade vertebral flow bilaterally.   ASSESSMENT: Asymptomatic bilateral carotid stenosis. High-grade right internal carotid artery stenosis. I believe the patient would benefit from carotid endarterectomy for stroke prophylaxis around the time of his cardiac bypass grafting in a combined case. I discussed with the patient today the risks benefits possible complications and procedure details of carotid endarterectomy including but not noted to bleeding infection cranial nerve injury and stroke risk. Also informed the patient that his stroke risk is higher than the isolated carotid endarterectomy. However I also explained the patient that his stroke risk  is lower from coronary artery bypass grafting by having the carotid endarterectomy simultaneously.   PLAN:  Right carotid endarterectomy combined with cardiac bypass grafting. Our office will call Dr. Hendrickson's office to coordinate this. The patient is currently on Plavix. I will leave this to Dr. Hendrickson's discretion when he wishes to discontinue the Plavix before his operation. I would prefer that the patient be on antiplatelet in the form of aspirin at the time of operation.   Daniel Altemose, MD [facility] Office: [phone] Pager: [phone]  

## 2011-06-25 ENCOUNTER — Encounter: Payer: Self-pay | Admitting: Thoracic Surgery (Cardiothoracic Vascular Surgery)

## 2011-06-25 ENCOUNTER — Other Ambulatory Visit: Payer: Self-pay | Admitting: *Deleted

## 2011-06-25 ENCOUNTER — Ambulatory Visit (INDEPENDENT_AMBULATORY_CARE_PROVIDER_SITE_OTHER): Payer: Medicare Other | Admitting: Thoracic Surgery (Cardiothoracic Vascular Surgery)

## 2011-06-25 ENCOUNTER — Other Ambulatory Visit: Payer: Self-pay

## 2011-06-25 VITALS — BP 114/72 | HR 70 | Resp 16 | Ht 69.0 in | Wt 142.0 lb

## 2011-06-25 DIAGNOSIS — I2510 Atherosclerotic heart disease of native coronary artery without angina pectoris: Secondary | ICD-10-CM

## 2011-06-25 DIAGNOSIS — I658 Occlusion and stenosis of other precerebral arteries: Secondary | ICD-10-CM

## 2011-06-25 DIAGNOSIS — I6523 Occlusion and stenosis of bilateral carotid arteries: Secondary | ICD-10-CM

## 2011-06-25 NOTE — Progress Notes (Signed)
HPI:  Mr. Aker returns today to further discuss coronary bypass grafting. He had presented in February with a syncopal spell and was found to have severe three-vessel coronary disease and mild to moderate mitral regurgitation. He had also been diagnosed with pneumonia and sepsis that time it was not a candidate for surgery initially. I said was discharged he came back to see me a couple weeks ago which time he was a little ambivalent about having surgery. He did agree to have his carotid duplex and pulmonary function testing done. His carotid duplex showed a greater than 80% stenosis in the right internal carotid artery and he was seen in consultation by Dr. fields who felt that he needed a carotid endarterectomy at the time of this bypass surgery. Past Medical History  Diagnosis Date  . Diabetes mellitus   . Enlarged heart   . Hyperlipidemia   . Hypertension   . Myocardial infarction   . Carotid stenosis      Current Outpatient Prescriptions  Medication Sig Dispense Refill  . ALPRAZolam (XANAX) 0.25 MG tablet Take 1 tablet (0.25 mg total) by mouth at bedtime as needed for sleep.  30 tablet  1  . budesonide-formoterol (SYMBICORT) 160-4.5 MCG/ACT inhaler Inhale 2 puffs into the lungs 2 (two) times daily.  1 Inhaler  2  . clopidogrel (PLAVIX) 75 MG tablet Take 1 tablet (75 mg total) by mouth daily with breakfast.  90 tablet  2  . enalapril (VASOTEC) 10 MG tablet Take 1 tablet (10 mg total) by mouth daily.  90 tablet  2  . FLUoxetine (PROZAC) 10 MG capsule Take 1 tablet qod  30 capsule  11  . furosemide (LASIX) 20 MG tablet Take 1 tablet (20 mg total) by mouth daily.  30 tablet  11  . glipiZIDE (GLUCOTROL) 10 MG tablet Take 1 tablet (10 mg total) by mouth 2 (two) times daily before a meal.  60 tablet  11  . insulin glargine (LANTUS) 100 UNIT/ML injection Inject 17 Units into the skin at bedtime.       . Menthol-Methyl Salicylate (MUSCLE RUB) 10-15 % CREA Apply 1 application topically as  needed.      . metFORMIN (GLUCOPHAGE) 1000 MG tablet Take 1 tablet (1,000 mg total) by mouth 2 (two) times daily with a meal.  60 tablet  11  . metoprolol tartrate (LOPRESSOR) 25 MG tablet Take 1 tablet (25 mg total) by mouth 2 (two) times daily.  60 tablet  11  . polysaccharide iron (NIFEREX) 150 MG CAPS capsule Take 150 mg by mouth daily.      . psyllium (HYDROCIL/METAMUCIL) 95 % PACK Take 1 packet by mouth as needed.  56 each    . simvastatin (ZOCOR) 40 MG tablet Take 1 tablet (40 mg total) by mouth daily at 6 PM.  90 tablet  2  . tiotropium (SPIRIVA) 18 MCG inhalation capsule Place 1 capsule (18 mcg total) into inhaler and inhale daily.  30 capsule    . VENTOLIN HFA 108 (90 BASE) MCG/ACT inhaler daily.        Physical Exam BP 114/72  Pulse 70  Resp 16  Ht 5' 9" (1.753 m)  Wt 142 lb (64.411 kg)  BMI 20.97 kg/m2  SpO2 98% Gen. thin 70-year-old male in no acute distress Neurologic no focal deficits, more animated today than he was at his last visit Cardiac exam regular rate and rhythm normal S1 and S2 Lungs clear with equal breath sounds bilaterally Extremities no edema    Diagnostic Tests: Carotid duplex showed a greater than 80% right internal carotid stenosis  Impression: 70-year-old gentleman with severe three-vessel coronary disease, mild to moderate mitral regurgitation by echocardiogram, and severe right internal carotid stenosis. He needs coronary bypass grafting for survival benefit. He is really minimally symptomatic presently. We will plan to do an intraoperative TEE to assess his mitral valve. He really doesn't have much of a murmur if at all, suspect we will have to do anything with his mitral valve but he does note a possibility it could be to be repaired. He is seen Dr. fields in consultation and needs a right carotid endarterectomy at the time of CABG.  I once again discussed with him the indications, risks, benefits, and alternatives. He understands that the risks of  surgery which were also discussed his last visit include death, stroke, MI, bleeding, possible need for transfusion, infections, other organ system dysfunction point respiratory, renal, or GI complications.  We did discuss the nature of the procedure, incisions used, expected outcomes, hospital stay, and overall recovery. He does understand he will not be a would go directly home by himself after his hospitalization, will need some type of assisted living or rehabilitation temporarily before he is ready to be on his own again.  Plan:  Combined right carotid endarterectomy and coronary artery bypass grafting and possible mitral valve repair on Tuesday, May 7.  

## 2011-06-27 ENCOUNTER — Encounter (HOSPITAL_COMMUNITY)
Admission: RE | Admit: 2011-06-27 | Discharge: 2011-06-27 | Disposition: A | Discharge status: Home / Self Care | Payer: Medicare Other | Source: Ambulatory Visit | Attending: Thoracic Surgery (Cardiothoracic Vascular Surgery) | Admitting: Thoracic Surgery (Cardiothoracic Vascular Surgery)

## 2011-06-27 ENCOUNTER — Encounter (HOSPITAL_COMMUNITY): Payer: Self-pay | Admitting: Pharmacy Technician

## 2011-06-27 ENCOUNTER — Encounter (HOSPITAL_COMMUNITY): Payer: Self-pay

## 2011-06-27 ENCOUNTER — Encounter (HOSPITAL_COMMUNITY)
Admission: RE | Admit: 2011-06-27 | Discharge: 2011-06-27 | Payer: Medicare Other | Source: Ambulatory Visit | Attending: Thoracic Surgery (Cardiothoracic Vascular Surgery) | Admitting: Thoracic Surgery (Cardiothoracic Vascular Surgery)

## 2011-06-27 VITALS — BP 138/85 | HR 66 | Temp 97.1°F | Resp 18 | Ht 69.0 in | Wt 141.1 lb

## 2011-06-27 DIAGNOSIS — I2510 Atherosclerotic heart disease of native coronary artery without angina pectoris: Secondary | ICD-10-CM

## 2011-06-27 HISTORY — DX: Anemia, unspecified: D64.9

## 2011-06-27 HISTORY — DX: Atherosclerotic heart disease of native coronary artery without angina pectoris: I25.10

## 2011-06-27 HISTORY — DX: Pneumonia, unspecified organism: J18.9

## 2011-06-27 LAB — BLOOD GAS, ARTERIAL
Bicarbonate: 21.2 mEq/L (ref 20.0–24.0)
Patient temperature: 98.6
TCO2: 22.2 mmol/L (ref 0–100)
pCO2 arterial: 30 mmHg — ABNORMAL LOW (ref 35.0–45.0)
pH, Arterial: 7.464 — ABNORMAL HIGH (ref 7.350–7.450)
pO2, Arterial: 59.6 mmHg — ABNORMAL LOW (ref 80.0–100.0)

## 2011-06-27 LAB — COMPREHENSIVE METABOLIC PANEL
ALT: 11 U/L (ref 0–53)
Albumin: 3.9 g/dL (ref 3.5–5.2)
Alkaline Phosphatase: 50 U/L (ref 39–117)
Chloride: 99 mEq/L (ref 96–112)
GFR calc Af Amer: >90 mL/min (ref >90)
Glucose, Bld: 66 mg/dL — ABNORMAL LOW (ref 70–99)
Potassium: 4.2 mEq/L (ref 3.5–5.1)
Sodium: 135 mEq/L (ref 135–145)
Total Bilirubin: 0.3 mg/dL (ref 0.3–1.2)
Total Protein: 8.5 g/dL — ABNORMAL HIGH (ref 6.0–8.3)

## 2011-06-27 LAB — CBC
Hemoglobin: 11.8 g/dL — ABNORMAL LOW (ref 13.0–17.0)
MCHC: 33.6 g/dL (ref 30.0–36.0)
WBC: 12.5 10*3/uL — ABNORMAL HIGH (ref 4.0–10.5)

## 2011-06-27 LAB — URINALYSIS, ROUTINE W REFLEX MICROSCOPIC
Ketones, ur: NEGATIVE mg/dL
Leukocytes, UA: NEGATIVE
Nitrite: NEGATIVE
Protein, ur: NEGATIVE mg/dL
Urobilinogen, UA: 0.2 mg/dL (ref 0.0–1.0)

## 2011-06-27 LAB — PROTIME-INR: INR: 1 (ref 0.00–1.49)

## 2011-06-27 LAB — SURGICAL PCR SCREEN: Staphylococcus aureus: POSITIVE — AB

## 2011-06-27 MED ORDER — CHLORHEXIDINE GLUCONATE 4 % EX LIQD: 30.0000 mL | CUTANEOUS | Status: DC

## 2011-06-27 NOTE — Pre-Procedure Instructions (Signed)
[address] Duong Haydel Walski  06/27/2011   Your procedure is scheduled on:  Tuesday Jul 01, 2011.  Report to [facility] at 0530 AM.  Call this number if you have problems the morning of surgery: [phone]   Remember:   Do not eat food:After Midnight.  May have clear liquids: up to 4 Hours before arrival until 0130 am.  Clear liquids include soda, tea, black coffee, apple or grape juice, broth.  Take these medicines the morning of surgery with A SIP OF WATER: Alprazolam (Xanax), Symbicort, Spiriva, & Ventolin inhalers, Fluoxetine (Prozac), and Metoprolol (Lopressor).   Do not wear jewelry, make-up or nail polish.  Do not wear lotions, powders, or perfumes. You may wear deodorant.  Do not shave 48 hours prior to surgery.  Do not bring valuables to the hospital.  Contacts, dentures or bridgework may not be worn into surgery.  Leave suitcase in the car. After surgery it may be brought to your room.  For patients admitted to the hospital, checkout time is 11:00 AM the day of discharge.   Patients discharged the day of surgery will not be allowed to drive home.  Name and phone number of your driver:   Special Instructions: CHG Shower Use Special Wash: 1/2 bottle night before surgery and 1/2 bottle morning of surgery.   Please read over the following fact sheets that you were given: Pain Booklet, Coughing and Deep Breathing, Blood Transfusion Information, Open Heart Packet, MRSA Information and Surgical Site Infection Prevention

## 2011-06-30 MED ORDER — SODIUM CHLORIDE 0.9 % IV SOLN
1250.0000 mg | INTRAVENOUS | Status: AC
  Administered 2011-07-01: 1250 mg via INTRAVENOUS
  Filled 2011-06-30: qty 1250

## 2011-06-30 MED ORDER — SODIUM CHLORIDE 0.9 % IV SOLN
1.5000 mg/kg/h | INTRAVENOUS | Status: AC
  Administered 2011-07-01: 1.5 mg/kg/h via INTRAVENOUS
  Filled 2011-06-30: qty 25

## 2011-06-30 MED ORDER — MAGNESIUM SULFATE 50 % IJ SOLN
40.0000 meq | INTRAMUSCULAR | Status: DC
  Filled 2011-06-30: qty 10

## 2011-06-30 MED ORDER — SODIUM CHLORIDE 0.9 % IV SOLN
INTRAVENOUS | Status: AC
  Administered 2011-07-01: .9 [IU]/h via INTRAVENOUS
  Filled 2011-06-30: qty 1

## 2011-06-30 MED ORDER — TRANEXAMIC ACID (OHS) BOLUS VIA INFUSION
15.0000 mg/kg | INTRAVENOUS | Status: DC
  Filled 2011-06-30: qty 960

## 2011-06-30 MED ORDER — PLASMA-LYTE 148 IV SOLN
INTRAVENOUS | Status: AC
  Administered 2011-07-01: 14:00:00
  Filled 2011-06-30 (×2): qty 2.5

## 2011-06-30 MED ORDER — SODIUM CHLORIDE 0.9 % IV SOLN
0.1000 ug/kg/h | INTRAVENOUS | Status: AC
  Administered 2011-07-01: .2 ug/kg/h via INTRAVENOUS
  Filled 2011-06-30 (×2): qty 4

## 2011-06-30 MED ORDER — PHENYLEPHRINE HCL 10 MG/ML IJ SOLN
30.0000 ug/min | INTRAVENOUS | Status: AC
  Administered 2011-07-01: 10 ug/min via INTRAVENOUS
  Filled 2011-06-30: qty 2

## 2011-06-30 MED ORDER — EPINEPHRINE HCL 1 MG/ML IJ SOLN
0.5000 ug/min | INTRAVENOUS | Status: DC
  Filled 2011-06-30: qty 4

## 2011-06-30 MED ORDER — POTASSIUM CHLORIDE 2 MEQ/ML IV SOLN
80.0000 meq | INTRAVENOUS | Status: DC
  Filled 2011-06-30: qty 40

## 2011-06-30 MED ORDER — NITROGLYCERIN IN D5W 200-5 MCG/ML-% IV SOLN
2.0000 ug/min | INTRAVENOUS | Status: AC
  Administered 2011-07-01: 5 ug/min via INTRAVENOUS
  Filled 2011-06-30: qty 250

## 2011-06-30 MED ORDER — DEXTROSE 5 % IV SOLN
1.5000 g | INTRAVENOUS | Status: AC
  Administered 2011-07-01: .75 g via INTRAVENOUS
  Administered 2011-07-01: 1.5 g via INTRAVENOUS
  Filled 2011-06-30: qty 1.5

## 2011-06-30 MED ORDER — DOPAMINE-DEXTROSE 3.2-5 MG/ML-% IV SOLN
2.0000 ug/kg/min | INTRAVENOUS | Status: DC
  Filled 2011-06-30: qty 250

## 2011-06-30 MED ORDER — TRANEXAMIC ACID (OHS) PUMP PRIME SOLUTION
2.0000 mg/kg | INTRAVENOUS | Status: DC
  Filled 2011-06-30: qty 1.28

## 2011-06-30 MED ORDER — METOPROLOL TARTRATE 12.5 MG HALF TABLET
12.5000 mg | ORAL_TABLET | Freq: Once | ORAL | Status: AC
  Administered 2011-07-01: 12.5 mg via ORAL
  Filled 2011-06-30: qty 1

## 2011-06-30 MED ORDER — DEXTROSE 5 % IV SOLN
750.0000 mg | INTRAVENOUS | Status: DC
  Filled 2011-06-30: qty 750

## 2011-06-30 NOTE — Consult Note (Signed)
Anesthesia Chart Review:  Patient is a 71 year old male scheduled for CABG, possible MV repair (Dr. Dorris Fetch), right CEA (Dr. Darrick Penna) on 07/01/11.  History includes CAD/MI, mild-moderate MR, cardiomyopathy, Severe right ICA stenosis and moderate left ICA stenosis (05/2011), DM2, former smoker, HTN, PNA 03/2011, anemia, HLD.  PCP is Dr. Nadyne Coombes.  EKG on 06/27/11 showed SR with PACs.  Cath by Dr. Donnie Aho on 04/24/11 showed: 1. Significant three-vessel coronary artery disease with significant stenosis involving the circumflex and right coronary artery, ostium of the diagonal branch and mid LAD 2. Abnormal ventricular function with inferior hypokinesis. EF 40-45%.  2D Echo on 04/15/11 showed: - Left ventricle: The cavity size was normal. Systolic function was moderately to severely reduced. The estimated ejection fraction was in the range of 30% to 35%. Moderate diffuse hypokinesis with regional variations. Severe hypokinesis of the inferior myocardium. - Mitral valve: Mild to moderate regurgitation directed posteriorly.  CXR on 06/27/11 showed: 1. No acute cardiopulmonary abnormalities.  2. Suspect COPD.  PFTs on 06/09/11 showed very severe airflow limitation. (Report in front of chart).  Labs noted.  WBC 12.5.  H/H 11.8/35.1.  Cr 0.72.  Glucose 66.  ABG shows pH 7.46, pCO2 30, pO2 59.6. HCO3 21.2.  (pO2 was 86.8 on 06/09/11).  I left a voicemail with Sherre Poot, RN at [facility] and sent fax with confirmation re: ABG results.   Shonna Chock, PA-C

## 2011-07-01 ENCOUNTER — Inpatient Hospital Stay (HOSPITAL_COMMUNITY): Payer: Medicare Other

## 2011-07-01 ENCOUNTER — Encounter (HOSPITAL_COMMUNITY): Payer: Self-pay | Admitting: *Deleted

## 2011-07-01 ENCOUNTER — Inpatient Hospital Stay (HOSPITAL_COMMUNITY)
Admission: RE | Admit: 2011-07-01 | Discharge: 2011-07-07 | DRG: 235 | Disposition: A | Discharge status: Skilled Nursing Facility | Payer: Medicare Other | Source: Ambulatory Visit | Attending: Thoracic Surgery (Cardiothoracic Vascular Surgery) | Admitting: Thoracic Surgery (Cardiothoracic Vascular Surgery)

## 2011-07-01 ENCOUNTER — Ambulatory Visit (HOSPITAL_COMMUNITY): Payer: Medicare Other | Admitting: Vascular Surgery

## 2011-07-01 ENCOUNTER — Other Ambulatory Visit: Payer: Self-pay

## 2011-07-01 ENCOUNTER — Encounter (HOSPITAL_COMMUNITY): Payer: Self-pay | Admitting: Vascular Surgery

## 2011-07-01 ENCOUNTER — Encounter (HOSPITAL_COMMUNITY): Payer: Self-pay | Admitting: Anesthesiology

## 2011-07-01 ENCOUNTER — Encounter (HOSPITAL_COMMUNITY)
Admission: RE | Disposition: A | Discharge status: Skilled Nursing Facility | Payer: Self-pay | Source: Ambulatory Visit | Attending: Thoracic Surgery (Cardiothoracic Vascular Surgery)

## 2011-07-01 ENCOUNTER — Inpatient Hospital Stay (HOSPITAL_COMMUNITY): Payer: Medicare Other | Admitting: Anesthesiology

## 2011-07-01 DIAGNOSIS — Z951 Presence of aortocoronary bypass graft: Secondary | ICD-10-CM

## 2011-07-01 DIAGNOSIS — Z87891 Personal history of nicotine dependence: Secondary | ICD-10-CM

## 2011-07-01 DIAGNOSIS — Z01812 Encounter for preprocedural laboratory examination: Secondary | ICD-10-CM

## 2011-07-01 DIAGNOSIS — F329 Major depressive disorder, single episode, unspecified: Secondary | ICD-10-CM | POA: Diagnosis present

## 2011-07-01 DIAGNOSIS — Z7902 Long term (current) use of antithrombotics/antiplatelets: Secondary | ICD-10-CM

## 2011-07-01 DIAGNOSIS — Z01818 Encounter for other preprocedural examination: Secondary | ICD-10-CM

## 2011-07-01 DIAGNOSIS — Z794 Long term (current) use of insulin: Secondary | ICD-10-CM

## 2011-07-01 DIAGNOSIS — Z79899 Other long term (current) drug therapy: Secondary | ICD-10-CM

## 2011-07-01 DIAGNOSIS — I252 Old myocardial infarction: Secondary | ICD-10-CM

## 2011-07-01 DIAGNOSIS — F32A Depression, unspecified: Secondary | ICD-10-CM

## 2011-07-01 DIAGNOSIS — J449 Chronic obstructive pulmonary disease, unspecified: Secondary | ICD-10-CM | POA: Insufficient documentation

## 2011-07-01 DIAGNOSIS — I6529 Occlusion and stenosis of unspecified carotid artery: Secondary | ICD-10-CM | POA: Diagnosis present

## 2011-07-01 DIAGNOSIS — I779 Disorder of arteries and arterioles, unspecified: Secondary | ICD-10-CM

## 2011-07-01 DIAGNOSIS — E785 Hyperlipidemia, unspecified: Secondary | ICD-10-CM

## 2011-07-01 DIAGNOSIS — E119 Type 2 diabetes mellitus without complications: Secondary | ICD-10-CM

## 2011-07-01 DIAGNOSIS — R5381 Other malaise: Secondary | ICD-10-CM | POA: Diagnosis not present

## 2011-07-01 DIAGNOSIS — D62 Acute posthemorrhagic anemia: Secondary | ICD-10-CM

## 2011-07-01 DIAGNOSIS — I059 Rheumatic mitral valve disease, unspecified: Secondary | ICD-10-CM | POA: Diagnosis present

## 2011-07-01 DIAGNOSIS — E118 Type 2 diabetes mellitus with unspecified complications: Secondary | ICD-10-CM

## 2011-07-01 DIAGNOSIS — I2510 Atherosclerotic heart disease of native coronary artery without angina pectoris: Secondary | ICD-10-CM

## 2011-07-01 DIAGNOSIS — J95822 Acute and chronic postprocedural respiratory failure: Secondary | ICD-10-CM | POA: Diagnosis not present

## 2011-07-01 DIAGNOSIS — F3289 Other specified depressive episodes: Secondary | ICD-10-CM | POA: Diagnosis present

## 2011-07-01 DIAGNOSIS — I10 Essential (primary) hypertension: Secondary | ICD-10-CM

## 2011-07-01 DIAGNOSIS — J4489 Other specified chronic obstructive pulmonary disease: Secondary | ICD-10-CM | POA: Diagnosis present

## 2011-07-01 DIAGNOSIS — Z01810 Encounter for preprocedural cardiovascular examination: Secondary | ICD-10-CM

## 2011-07-01 DIAGNOSIS — J439 Emphysema, unspecified: Secondary | ICD-10-CM | POA: Insufficient documentation

## 2011-07-01 HISTORY — PX: ENDARTERECTOMY: SHX5162

## 2011-07-01 HISTORY — PX: CAROTID ENDARTERECTOMY: SUR193

## 2011-07-01 HISTORY — PX: CORONARY ARTERY BYPASS GRAFT: SHX141

## 2011-07-01 LAB — MAGNESIUM: Magnesium: 2.4 mg/dL (ref 1.5–2.5)

## 2011-07-01 LAB — POCT I-STAT 3, ART BLOOD GAS (G3+)
Acid-base deficit: 7 mmol/L — ABNORMAL HIGH (ref 0.0–2.0)
Bicarbonate: 21.6 mEq/L (ref 20.0–24.0)
Bicarbonate: 22 mEq/L (ref 20.0–24.0)
Bicarbonate: 23.9 mEq/L (ref 20.0–24.0)
O2 Saturation: 100 %
O2 Saturation: 100 %
O2 Saturation: 91 %
Patient temperature: 99.5
Patient temperature: 99.5
TCO2: 21 mmol/L (ref 0–100)
TCO2: 22 mmol/L (ref 0–100)
TCO2: 23 mmol/L (ref 0–100)
TCO2: 23 mmol/L (ref 0–100)
TCO2: 23 mmol/L (ref 0–100)
pCO2 arterial: 33.4 mmHg — ABNORMAL LOW (ref 35.0–45.0)
pCO2 arterial: 42.7 mmHg (ref 35.0–45.0)
pCO2 arterial: 44.8 mmHg (ref 35.0–45.0)
pH, Arterial: 7.232 — ABNORMAL LOW (ref 7.350–7.450)
pH, Arterial: 7.291 — ABNORMAL LOW (ref 7.350–7.450)
pH, Arterial: 7.374 (ref 7.350–7.450)
pH, Arterial: 7.425 (ref 7.350–7.450)
pH, Arterial: 7.469 — ABNORMAL HIGH (ref 7.350–7.450)
pO2, Arterial: 65 mmHg — ABNORMAL LOW (ref 80.0–100.0)
pO2, Arterial: 360 mmHg — ABNORMAL HIGH (ref 80.0–100.0)

## 2011-07-01 LAB — CBC
HCT: 30.6 % — ABNORMAL LOW (ref 39.0–52.0)
Hemoglobin: 10.5 g/dL — ABNORMAL LOW (ref 13.0–17.0)
MCH: 29.2 pg (ref 26.0–34.0)
MCHC: 34.3 g/dL (ref 30.0–36.0)
Platelets: 162 10*3/uL (ref 150–400)
RBC: 3.09 MIL/uL — ABNORMAL LOW (ref 4.22–5.81)
WBC: 9.7 10*3/uL (ref 4.0–10.5)

## 2011-07-01 LAB — POCT I-STAT, CHEM 8
BUN: 10 mg/dL (ref 6–23)
Chloride: 104 mEq/L (ref 96–112)
Creatinine, Ser: 0.6 mg/dL (ref 0.50–1.35)
Potassium: 3.7 mEq/L (ref 3.5–5.1)
Sodium: 137 mEq/L (ref 135–145)

## 2011-07-01 LAB — POCT I-STAT 4, (NA,K, GLUC, HGB,HCT)
Glucose, Bld: 126 mg/dL — ABNORMAL HIGH (ref 70–99)
HCT: 25 % — ABNORMAL LOW (ref 39.0–52.0)
Hemoglobin: 10.2 g/dL — ABNORMAL LOW (ref 13.0–17.0)
Hemoglobin: 8.5 g/dL — ABNORMAL LOW (ref 13.0–17.0)
Potassium: 4.1 mEq/L (ref 3.5–5.1)
Potassium: 4.2 mEq/L (ref 3.5–5.1)
Potassium: 5.4 mEq/L — ABNORMAL HIGH (ref 3.5–5.1)
Sodium: 137 mEq/L (ref 135–145)

## 2011-07-01 LAB — PROTIME-INR: INR: 1.42 (ref 0.00–1.49)

## 2011-07-01 LAB — CREATININE, SERUM
GFR calc Af Amer: >90 mL/min (ref >90)
GFR calc non Af Amer: >90 mL/min (ref >90)

## 2011-07-01 LAB — POCT I-STAT GLUCOSE: Operator id: 150791

## 2011-07-01 LAB — GLUCOSE, CAPILLARY
Glucose-Capillary: 103 mg/dL — ABNORMAL HIGH (ref 70–99)
Glucose-Capillary: 96 mg/dL (ref 70–99)

## 2011-07-01 LAB — HEMOGLOBIN AND HEMATOCRIT, BLOOD: HCT: 24.2 % — ABNORMAL LOW (ref 39.0–52.0)

## 2011-07-01 SURGERY — CORONARY ARTERY BYPASS GRAFTING (CABG)
Anesthesia: General | Site: Chest | Laterality: Right | Wound class: Clean

## 2011-07-01 MED ORDER — HEMOSTATIC AGENTS (NO CHARGE) OPTIME
TOPICAL | Status: DC | PRN
  Administered 2011-07-01: 3 via TOPICAL

## 2011-07-01 MED ORDER — FLUOXETINE HCL 10 MG PO CAPS
10.0000 mg | ORAL_CAPSULE | Freq: Every day | ORAL | Status: DC
  Administered 2011-07-03 – 2011-07-07 (×5): 10 mg via ORAL
  Filled 2011-07-01 (×7): qty 1

## 2011-07-01 MED ORDER — ALBUTEROL SULFATE HFA 108 (90 BASE) MCG/ACT IN AERS
2.0000 | INHALATION_SPRAY | Freq: Four times a day (QID) | RESPIRATORY_TRACT | Status: DC | PRN
  Filled 2011-07-01: qty 6.7

## 2011-07-01 MED ORDER — SODIUM CHLORIDE 0.9 % IV SOLN: INTRAVENOUS | Status: DC

## 2011-07-01 MED ORDER — ACETAMINOPHEN 160 MG/5ML PO SOLN
975.0000 mg | Freq: Four times a day (QID) | ORAL | Status: AC
  Administered 2011-07-02 – 2011-07-06 (×2): 975 mg
  Filled 2011-07-01: qty 20.3

## 2011-07-01 MED ORDER — LACTATED RINGERS IV SOLN: 500.0000 mL | Freq: Once | INTRAVENOUS | Status: DC | PRN

## 2011-07-01 MED ORDER — NITROGLYCERIN IN D5W 200-5 MCG/ML-% IV SOLN: 0.0000 ug/min | INTRAVENOUS | Status: DC

## 2011-07-01 MED ORDER — ALBUMIN HUMAN 5 % IV SOLN
250.0000 mL | INTRAVENOUS | Status: AC | PRN
  Administered 2011-07-01 (×4): 250 mL via INTRAVENOUS

## 2011-07-01 MED ORDER — POTASSIUM CHLORIDE 10 MEQ/50ML IV SOLN
10.0000 meq | INTRAVENOUS | Status: AC
  Administered 2011-07-01 (×3): 10 meq via INTRAVENOUS

## 2011-07-01 MED ORDER — HEMOSTATIC AGENTS (NO CHARGE) OPTIME
TOPICAL | Status: DC | PRN
  Administered 2011-07-01: 1 via TOPICAL

## 2011-07-01 MED ORDER — METOPROLOL TARTRATE 1 MG/ML IV SOLN: 2.5000 mg | INTRAVENOUS | Status: DC | PRN

## 2011-07-01 MED ORDER — PROTAMINE SULFATE 10 MG/ML IV SOLN
INTRAVENOUS | Status: DC | PRN
  Administered 2011-07-01: 300 mg via INTRAVENOUS

## 2011-07-01 MED ORDER — DOCUSATE SODIUM 100 MG PO CAPS
200.0000 mg | ORAL_CAPSULE | Freq: Every day | ORAL | Status: DC
  Administered 2011-07-03 – 2011-07-07 (×4): 200 mg via ORAL
  Filled 2011-07-01 (×5): qty 2

## 2011-07-01 MED ORDER — LIDOCAINE HCL (CARDIAC) 20 MG/ML IV SOLN
INTRAVENOUS | Status: DC | PRN
  Administered 2011-07-01: 100 mg via INTRAVENOUS

## 2011-07-01 MED ORDER — ASPIRIN 81 MG PO CHEW: 324.0000 mg | CHEWABLE_TABLET | Freq: Every day | ORAL | Status: DC

## 2011-07-01 MED ORDER — 0.9 % SODIUM CHLORIDE (POUR BTL) OPTIME
TOPICAL | Status: DC | PRN
  Administered 2011-07-01: 6000 mL

## 2011-07-01 MED ORDER — SODIUM CHLORIDE 0.9 % IJ SOLN
3.0000 mL | Freq: Two times a day (BID) | INTRAMUSCULAR | Status: DC
  Administered 2011-07-02 – 2011-07-03 (×3): 3 mL via INTRAVENOUS

## 2011-07-01 MED ORDER — NITROGLYCERIN 5 MG/ML IV SOLN
INTRAVENOUS | Status: DC
  Filled 2011-07-01 (×2): qty 2.5

## 2011-07-01 MED ORDER — ALPRAZOLAM 0.25 MG PO TABS
0.2500 mg | ORAL_TABLET | Freq: Every evening | ORAL | Status: DC | PRN
  Administered 2011-07-04 – 2011-07-05 (×2): 0.25 mg via ORAL
  Filled 2011-07-01 (×3): qty 1

## 2011-07-01 MED ORDER — LACTATED RINGERS IV SOLN
INTRAVENOUS | Status: DC | PRN
  Administered 2011-07-01 (×6): via INTRAVENOUS

## 2011-07-01 MED ORDER — MIDAZOLAM HCL 2 MG/2ML IJ SOLN
2.0000 mg | INTRAMUSCULAR | Status: DC | PRN
  Administered 2011-07-01: 1 mg via INTRAVENOUS
  Filled 2011-07-01: qty 2

## 2011-07-01 MED ORDER — SODIUM BICARBONATE 8.4 % IV SOLN
100.0000 meq | Freq: Once | INTRAVENOUS | Status: AC
  Administered 2011-07-01: 100 meq via INTRAVENOUS
  Filled 2011-07-01: qty 100

## 2011-07-01 MED ORDER — MORPHINE SULFATE 2 MG/ML IJ SOLN
2.0000 mg | INTRAMUSCULAR | Status: DC | PRN
  Administered 2011-07-01: 2 mg via INTRAVENOUS
  Administered 2011-07-02 – 2011-07-03 (×2): 4 mg via INTRAVENOUS
  Filled 2011-07-01: qty 1
  Filled 2011-07-01 (×2): qty 2

## 2011-07-01 MED ORDER — BISACODYL 5 MG PO TBEC
10.0000 mg | DELAYED_RELEASE_TABLET | Freq: Every day | ORAL | Status: DC
  Administered 2011-07-03 – 2011-07-07 (×3): 10 mg via ORAL
  Filled 2011-07-01 (×3): qty 2

## 2011-07-01 MED ORDER — PHENYLEPHRINE HCL 10 MG/ML IJ SOLN
0.0000 ug/min | INTRAVENOUS | Status: DC
  Filled 2011-07-01: qty 2

## 2011-07-01 MED ORDER — SODIUM CHLORIDE 0.9 % IV SOLN
0.4000 ug/kg/h | INTRAVENOUS | Status: DC
  Filled 2011-07-01 (×3): qty 4

## 2011-07-01 MED ORDER — TIOTROPIUM BROMIDE MONOHYDRATE 18 MCG IN CAPS
18.0000 ug | ORAL_CAPSULE | Freq: Every day | RESPIRATORY_TRACT | Status: DC
  Administered 2011-07-03 – 2011-07-07 (×5): 18 ug via RESPIRATORY_TRACT
  Filled 2011-07-01: qty 5

## 2011-07-01 MED ORDER — INSULIN ASPART 100 UNIT/ML ~~LOC~~ SOLN: 0.0000 [IU] | SUBCUTANEOUS | Status: DC

## 2011-07-01 MED ORDER — MUPIROCIN 2 % EX OINT
TOPICAL_OINTMENT | CUTANEOUS | Status: AC
  Filled 2011-07-01: qty 22

## 2011-07-01 MED ORDER — PANTOPRAZOLE SODIUM 40 MG PO TBEC
40.0000 mg | DELAYED_RELEASE_TABLET | Freq: Every day | ORAL | Status: DC
  Administered 2011-07-03 – 2011-07-07 (×3): 40 mg via ORAL
  Filled 2011-07-01 (×2): qty 1

## 2011-07-01 MED ORDER — THROMBIN 20000 UNITS EX KIT
PACK | CUTANEOUS | Status: DC | PRN
  Administered 2011-07-01: 20000 [IU] via TOPICAL

## 2011-07-01 MED ORDER — MUPIROCIN 2 % EX OINT
TOPICAL_OINTMENT | Freq: Once | CUTANEOUS | Status: AC
  Administered 2011-07-01: 06:00:00 via NASAL

## 2011-07-01 MED ORDER — SODIUM BICARBONATE 8.4 % IV SOLN
INTRAVENOUS | Status: AC
  Filled 2011-07-01: qty 100

## 2011-07-01 MED ORDER — DEXTROSE 5 % IV SOLN
1.5000 g | Freq: Two times a day (BID) | INTRAVENOUS | Status: AC
  Administered 2011-07-01 – 2011-07-03 (×4): 1.5 g via INTRAVENOUS
  Filled 2011-07-01 (×4): qty 1.5

## 2011-07-01 MED ORDER — SODIUM CHLORIDE 0.9 % IV SOLN
INTRAVENOUS | Status: DC
  Filled 2011-07-01: qty 1

## 2011-07-01 MED ORDER — ROCURONIUM BROMIDE 100 MG/10ML IV SOLN
INTRAVENOUS | Status: DC | PRN
  Administered 2011-07-01: 50 mg via INTRAVENOUS

## 2011-07-01 MED ORDER — FAMOTIDINE IN NACL 20-0.9 MG/50ML-% IV SOLN
20.0000 mg | Freq: Two times a day (BID) | INTRAVENOUS | Status: AC
  Administered 2011-07-02: 20 mg via INTRAVENOUS
  Filled 2011-07-01: qty 50

## 2011-07-01 MED ORDER — SODIUM CHLORIDE 0.9 % IV SOLN: 250.0000 mL | INTRAVENOUS | Status: DC

## 2011-07-01 MED ORDER — BISACODYL 10 MG RE SUPP: 10.0000 mg | Freq: Every day | RECTAL | Status: DC

## 2011-07-01 MED ORDER — ALBUTEROL SULFATE (5 MG/ML) 0.5% IN NEBU
INHALATION_SOLUTION | RESPIRATORY_TRACT | Status: AC
  Filled 2011-07-01: qty 0.5

## 2011-07-01 MED ORDER — VANCOMYCIN HCL IN DEXTROSE 1-5 GM/200ML-% IV SOLN
1000.0000 mg | Freq: Once | INTRAVENOUS | Status: AC
  Administered 2011-07-01: 1000 mg via INTRAVENOUS
  Filled 2011-07-01: qty 200

## 2011-07-01 MED ORDER — SODIUM CHLORIDE 0.9 % IJ SOLN: 3.0000 mL | INTRAMUSCULAR | Status: DC | PRN

## 2011-07-01 MED ORDER — SODIUM CHLORIDE 0.9 % IV SOLN
0.1000 ug/kg/h | INTRAVENOUS | Status: DC
  Filled 2011-07-01: qty 2

## 2011-07-01 MED ORDER — VECURONIUM BROMIDE 10 MG IV SOLR
INTRAVENOUS | Status: DC | PRN
  Administered 2011-07-01 (×3): 5 mg via INTRAVENOUS
  Administered 2011-07-01: 10 mg via INTRAVENOUS
  Administered 2011-07-01: 5 mg via INTRAVENOUS

## 2011-07-01 MED ORDER — ALBUTEROL SULFATE (5 MG/ML) 0.5% IN NEBU
2.5000 mg | INHALATION_SOLUTION | Freq: Once | RESPIRATORY_TRACT | Status: AC
  Administered 2011-07-01: 2.5 mg via RESPIRATORY_TRACT

## 2011-07-01 MED ORDER — SODIUM CHLORIDE 0.9 % IR SOLN
Status: DC | PRN
  Administered 2011-07-01: 10:00:00

## 2011-07-01 MED ORDER — ALBUTEROL SULFATE HFA 108 (90 BASE) MCG/ACT IN AERS
2.0000 | INHALATION_SPRAY | Freq: Four times a day (QID) | RESPIRATORY_TRACT | Status: DC
  Administered 2011-07-02 (×4): 2 via RESPIRATORY_TRACT
  Filled 2011-07-01 (×2): qty 6.7

## 2011-07-01 MED ORDER — METOPROLOL TARTRATE 25 MG/10 ML ORAL SUSPENSION
12.5000 mg | Freq: Two times a day (BID) | ORAL | Status: DC
  Administered 2011-07-06: 12.5 mg
  Filled 2011-07-01 (×13): qty 5

## 2011-07-01 MED ORDER — ACETAMINOPHEN 650 MG RE SUPP
650.0000 mg | RECTAL | Status: AC
  Administered 2011-07-01: 650 mg via RECTAL

## 2011-07-01 MED ORDER — BUDESONIDE-FORMOTEROL FUMARATE 160-4.5 MCG/ACT IN AERO
2.0000 | INHALATION_SPRAY | Freq: Two times a day (BID) | RESPIRATORY_TRACT | Status: DC
  Administered 2011-07-02 – 2011-07-07 (×8): 2 via RESPIRATORY_TRACT
  Filled 2011-07-01: qty 6

## 2011-07-01 MED ORDER — SIMVASTATIN 40 MG PO TABS
40.0000 mg | ORAL_TABLET | Freq: Every day | ORAL | Status: DC
  Administered 2011-07-02 – 2011-07-05 (×4): 40 mg via ORAL
  Filled 2011-07-01 (×7): qty 1

## 2011-07-01 MED ORDER — INSULIN ASPART 100 UNIT/ML ~~LOC~~ SOLN
0.0000 [IU] | SUBCUTANEOUS | Status: AC
  Administered 2011-07-02 (×2): 4 [IU] via SUBCUTANEOUS

## 2011-07-01 MED ORDER — ASPIRIN EC 325 MG PO TBEC
325.0000 mg | DELAYED_RELEASE_TABLET | Freq: Every day | ORAL | Status: DC
  Administered 2011-07-03 – 2011-07-07 (×5): 325 mg via ORAL
  Filled 2011-07-01 (×6): qty 1

## 2011-07-01 MED ORDER — LACTATED RINGERS IV SOLN: INTRAVENOUS | Status: DC

## 2011-07-01 MED ORDER — ONDANSETRON HCL 4 MG/2ML IJ SOLN: 4.0000 mg | Freq: Four times a day (QID) | INTRAMUSCULAR | Status: DC | PRN

## 2011-07-01 MED ORDER — ACETAMINOPHEN 500 MG PO TABS
1000.0000 mg | ORAL_TABLET | Freq: Four times a day (QID) | ORAL | Status: AC
  Administered 2011-07-02 – 2011-07-06 (×12): 1000 mg via ORAL
  Filled 2011-07-01 (×17): qty 2

## 2011-07-01 MED ORDER — ALBUMIN HUMAN 5 % IV SOLN
INTRAVENOUS | Status: DC | PRN
  Administered 2011-07-01: 15:00:00 via INTRAVENOUS

## 2011-07-01 MED ORDER — ACETAMINOPHEN 160 MG/5ML PO SOLN: 650.0000 mg | ORAL | Status: AC

## 2011-07-01 MED ORDER — PROPOFOL 10 MG/ML IV EMUL
INTRAVENOUS | Status: DC | PRN
  Administered 2011-07-01: 30 mg via INTRAVENOUS

## 2011-07-01 MED ORDER — MAGNESIUM SULFATE 40 MG/ML IJ SOLN
4.0000 g | Freq: Once | INTRAMUSCULAR | Status: AC
  Administered 2011-07-01: 4 g via INTRAVENOUS
  Filled 2011-07-01: qty 100

## 2011-07-01 MED ORDER — POLYSACCHARIDE IRON COMPLEX 150 MG PO CAPS
150.0000 mg | ORAL_CAPSULE | Freq: Every day | ORAL | Status: DC
  Administered 2011-07-03 – 2011-07-07 (×5): 150 mg via ORAL
  Filled 2011-07-01 (×6): qty 1

## 2011-07-01 MED ORDER — METOPROLOL TARTRATE 12.5 MG HALF TABLET
12.5000 mg | ORAL_TABLET | Freq: Two times a day (BID) | ORAL | Status: DC
  Administered 2011-07-03 – 2011-07-07 (×8): 12.5 mg via ORAL
  Filled 2011-07-01 (×13): qty 1

## 2011-07-01 MED ORDER — GLYCOPYRROLATE 0.2 MG/ML IJ SOLN
INTRAMUSCULAR | Status: DC | PRN
  Administered 2011-07-01: 0.2 mg via INTRAVENOUS

## 2011-07-01 MED ORDER — INSULIN REGULAR BOLUS VIA INFUSION
0.0000 [IU] | Freq: Three times a day (TID) | INTRAVENOUS | Status: DC
  Filled 2011-07-01: qty 10

## 2011-07-01 MED ORDER — HEPARIN SODIUM (PORCINE) 1000 UNIT/ML IJ SOLN
INTRAMUSCULAR | Status: DC | PRN
  Administered 2011-07-01: 7000 [IU] via INTRAVENOUS
  Administered 2011-07-01: 35000 [IU] via INTRAVENOUS
  Administered 2011-07-01: 3000 [IU] via INTRAVENOUS

## 2011-07-01 MED ORDER — MIDAZOLAM HCL 5 MG/5ML IJ SOLN
INTRAMUSCULAR | Status: DC | PRN
  Administered 2011-07-01: 1 mg via INTRAVENOUS
  Administered 2011-07-01: 3 mg via INTRAVENOUS
  Administered 2011-07-01: 2 mg via INTRAVENOUS

## 2011-07-01 MED ORDER — OXYCODONE HCL 5 MG PO TABS
5.0000 mg | ORAL_TABLET | ORAL | Status: DC | PRN
  Administered 2011-07-02 – 2011-07-06 (×10): 10 mg via ORAL
  Filled 2011-07-01 (×11): qty 2

## 2011-07-01 MED ORDER — SUFENTANIL CITRATE 50 MCG/ML IV SOLN
INTRAVENOUS | Status: DC | PRN
  Administered 2011-07-01 (×2): 10 ug via INTRAVENOUS
  Administered 2011-07-01: 45 ug via INTRAVENOUS
  Administered 2011-07-01 (×2): 10 ug via INTRAVENOUS
  Administered 2011-07-01: 5 ug via INTRAVENOUS

## 2011-07-01 MED ORDER — SODIUM CHLORIDE 0.45 % IV SOLN
INTRAVENOUS | Status: DC
  Administered 2011-07-01: 20 mL via INTRAVENOUS

## 2011-07-01 MED ORDER — MORPHINE SULFATE 2 MG/ML IJ SOLN: 1.0000 mg | INTRAMUSCULAR | Status: AC | PRN

## 2011-07-01 SURGICAL SUPPLY — 151 items
ADAPTER CARDIO PERF ANTE/RETRO (ADAPTER) ×3 IMPLANT
ADH SKN CLS APL DERMABOND .7 (GAUZE/BANDAGES/DRESSINGS) ×4
ADPR PRFSN 84XANTGRD RTRGD (ADAPTER) ×2
ATTRACTOMAT 16X20 MAGNETIC DRP (DRAPES) ×3 IMPLANT
BAG DECANTER FOR FLEXI CONT (MISCELLANEOUS) ×3 IMPLANT
BANDAGE ELASTIC 4 VELCRO ST LF (GAUZE/BANDAGES/DRESSINGS) ×3 IMPLANT
BANDAGE ELASTIC 6 VELCRO ST LF (GAUZE/BANDAGES/DRESSINGS) ×3 IMPLANT
BANDAGE GAUZE ELAST BULKY 4 IN (GAUZE/BANDAGES/DRESSINGS) ×3 IMPLANT
BASKET HEART (ORDER IN 25'S) (MISCELLANEOUS) ×1
BASKET HEART (ORDER IN 25S) (MISCELLANEOUS) ×2 IMPLANT
BLADE STERNUM SYSTEM 6 (BLADE) ×3 IMPLANT
BLADE SURG 11 STRL SS (BLADE) ×1 IMPLANT
BLADE SURG 15 STRL LF DISP TIS (BLADE) ×2 IMPLANT
BLADE SURG 15 STRL SS (BLADE) ×3
CANISTER SUCTION 2500CC (MISCELLANEOUS) ×6 IMPLANT
CANN PRFSN .5XCNCT 15X34-48 (MISCELLANEOUS)
CANNULA ARTERIAL NVNT 3/8 22FR (MISCELLANEOUS) IMPLANT
CANNULA GUNDRY RCSP 15FR (MISCELLANEOUS) IMPLANT
CANNULA PRFSN .5XCNCT 15X34-48 (MISCELLANEOUS) IMPLANT
CANNULA VEN 2 STAGE (MISCELLANEOUS)
CANNULA VESSEL W/WING WO/VALVE (CANNULA) ×3 IMPLANT
CATH CPB KIT HENDRICKSON (MISCELLANEOUS) ×3 IMPLANT
CATH ROBINSON RED A/P 18FR (CATHETERS) ×6 IMPLANT
CATH THORACIC 36FR (CATHETERS) ×3 IMPLANT
CATH THORACIC 36FR RT ANG (CATHETERS) ×3 IMPLANT
CLIP FOGARTY SPRING 6M (CLIP) ×1 IMPLANT
CLIP TI MEDIUM 24 (CLIP) ×3 IMPLANT
CLIP TI WIDE RED SMALL 24 (CLIP) ×5 IMPLANT
CLOTH BEACON ORANGE TIMEOUT ST (SAFETY) ×6 IMPLANT
CONN 1/2X1/2X1/2  BEN (MISCELLANEOUS) ×1
CONN 1/2X1/2X1/2 BEN (MISCELLANEOUS) ×2 IMPLANT
CONN 3/8X1/2 ST GISH (MISCELLANEOUS) ×6 IMPLANT
CONN Y 3/8X3/8X3/8  BEN (MISCELLANEOUS)
CONN Y 3/8X3/8X3/8 BEN (MISCELLANEOUS) IMPLANT
CONT SPEC 4OZ CLIKSEAL STRL BL (MISCELLANEOUS) ×2 IMPLANT
COVER SURGICAL LIGHT HANDLE (MISCELLANEOUS) ×12 IMPLANT
CRADLE DONUT ADULT HEAD (MISCELLANEOUS) ×6 IMPLANT
DECANTER SPIKE VIAL GLASS SM (MISCELLANEOUS) IMPLANT
DERMABOND ADVANCED (GAUZE/BANDAGES/DRESSINGS) ×2
DERMABOND ADVANCED .7 DNX12 (GAUZE/BANDAGES/DRESSINGS) ×2 IMPLANT
DRAIN HEMOVAC 1/8 X 5 (WOUND CARE) IMPLANT
DRAIN JACKSON PRATT 10MM FLAT (MISCELLANEOUS) ×1 IMPLANT
DRAPE CARDIOVASCULAR INCISE (DRAPES) ×3
DRAPE SLUSH MACHINE 52X66 (DRAPES) ×1 IMPLANT
DRAPE SLUSH/WARMER DISC (DRAPES) IMPLANT
DRAPE SRG 135X102X78XABS (DRAPES) ×2 IMPLANT
DRAPE WARM FLUID 44X44 (DRAPE) ×3 IMPLANT
DRSG COVADERM 4X14 (GAUZE/BANDAGES/DRESSINGS) ×3 IMPLANT
ELECT CAUTERY BLADE 6.4 (BLADE) ×1 IMPLANT
ELECT REM PT RETURN 9FT ADLT (ELECTROSURGICAL) ×9
ELECTRODE REM PT RTRN 9FT ADLT (ELECTROSURGICAL) ×6 IMPLANT
EVACUATOR SILICONE 100CC (DRAIN) ×1 IMPLANT
GEL ULTRASOUND 20GR AQUASONIC (MISCELLANEOUS) IMPLANT
GLOVE BIO SURGEON STRL SZ 6 (GLOVE) ×3 IMPLANT
GLOVE BIO SURGEON STRL SZ 6.5 (GLOVE) ×5 IMPLANT
GLOVE BIO SURGEON STRL SZ7 (GLOVE) IMPLANT
GLOVE BIO SURGEON STRL SZ7.5 (GLOVE) ×3 IMPLANT
GLOVE BIOGEL PI IND STRL 6.5 (GLOVE) IMPLANT
GLOVE BIOGEL PI IND STRL 7.0 (GLOVE) IMPLANT
GLOVE BIOGEL PI IND STRL 7.5 (GLOVE) IMPLANT
GLOVE BIOGEL PI INDICATOR 6.5 (GLOVE)
GLOVE BIOGEL PI INDICATOR 7.0 (GLOVE) ×7
GLOVE BIOGEL PI INDICATOR 7.5 (GLOVE)
GLOVE EUDERMIC 7 POWDERFREE (GLOVE) ×9 IMPLANT
GOWN PREVENTION PLUS XLARGE (GOWN DISPOSABLE) ×9 IMPLANT
GOWN STRL NON-REIN LRG LVL3 (GOWN DISPOSABLE) ×18 IMPLANT
HEMOSTAT POWDER SURGIFOAM 1G (HEMOSTASIS) ×9 IMPLANT
HEMOSTAT SURGICEL 2X14 (HEMOSTASIS) ×3 IMPLANT
INSERT FOGARTY XLG (MISCELLANEOUS) IMPLANT
KIT BASIN OR (CUSTOM PROCEDURE TRAY) ×6 IMPLANT
KIT PAIN CUSTOM (MISCELLANEOUS) IMPLANT
KIT ROOM TURNOVER OR (KITS) ×6 IMPLANT
KIT SUCTION CATH 14FR (SUCTIONS) ×6 IMPLANT
KIT VASOVIEW W/TROCAR VH 2000 (KITS) ×3 IMPLANT
LOOP VESSEL MINI RED (MISCELLANEOUS) IMPLANT
MARKER GRAFT CORONARY BYPASS (MISCELLANEOUS) ×9 IMPLANT
NDL HYPO 25GX1X1/2 BEV (NEEDLE) IMPLANT
NEEDLE HYPO 25GX1X1/2 BEV (NEEDLE) IMPLANT
NS IRRIG 1000ML POUR BTL (IV SOLUTION) ×21 IMPLANT
PACK CAROTID (CUSTOM PROCEDURE TRAY) ×3 IMPLANT
PACK OPEN HEART (CUSTOM PROCEDURE TRAY) ×3 IMPLANT
PAD ARMBOARD 7.5X6 YLW CONV (MISCELLANEOUS) ×12 IMPLANT
PATCH HEMASHIELD 8X75 (Vascular Products) ×1 IMPLANT
PENCIL BUTTON HOLSTER BLD 10FT (ELECTRODE) ×3 IMPLANT
PUNCH AORTIC ROTATE 4.0MM (MISCELLANEOUS) ×1 IMPLANT
PUNCH AORTIC ROTATE 4.5MM 8IN (MISCELLANEOUS) IMPLANT
PUNCH AORTIC ROTATE 5MM 8IN (MISCELLANEOUS) IMPLANT
SET CARDIOPLEGIA MPS 5001102 (MISCELLANEOUS) ×1 IMPLANT
SHUNT CAROTID BYPASS 10 (VASCULAR PRODUCTS) ×1 IMPLANT
SHUNT CAROTID BYPASS 12FRX15.5 (VASCULAR PRODUCTS) IMPLANT
SOLUTION ANTI FOG 6CC (MISCELLANEOUS) ×1 IMPLANT
SPECIMEN JAR SMALL (MISCELLANEOUS) ×3 IMPLANT
SPONGE GAUZE 4X4 12PLY (GAUZE/BANDAGES/DRESSINGS) ×7 IMPLANT
SPONGE SURGIFOAM ABS GEL 100 (HEMOSTASIS) IMPLANT
STAPLER VISISTAT 35W (STAPLE) ×1 IMPLANT
SUCKER WEIGHTED FLEX (MISCELLANEOUS) ×3 IMPLANT
SUT BONE WAX W31G (SUTURE) ×3 IMPLANT
SUT ETHIBOND 2 0 SH (SUTURE) ×9 IMPLANT
SUT ETHIBOND 2 0 SH 36X2 (SUTURE) ×4 IMPLANT
SUT ETHIBOND 2 0 V4 (SUTURE) IMPLANT
SUT ETHIBOND 2 0V4 GREEN (SUTURE) IMPLANT
SUT ETHILON 3 0 PS 1 (SUTURE) IMPLANT
SUT MNCRL AB 4-0 PS2 18 (SUTURE) ×1 IMPLANT
SUT PROLENE 3 0 SH DA (SUTURE) ×3 IMPLANT
SUT PROLENE 3 0 SH1 36 (SUTURE) ×3 IMPLANT
SUT PROLENE 4 0 RB 1 (SUTURE) ×6
SUT PROLENE 4 0 SH DA (SUTURE) IMPLANT
SUT PROLENE 4-0 RB1 .5 CRCL 36 (SUTURE) ×4 IMPLANT
SUT PROLENE 5 0 C 1 36 (SUTURE) ×6 IMPLANT
SUT PROLENE 5 0 CC1 (SUTURE) ×3 IMPLANT
SUT PROLENE 6 0 C 1 30 (SUTURE) ×8 IMPLANT
SUT PROLENE 6 0 CC (SUTURE) ×4 IMPLANT
SUT PROLENE 7 0 BV 1 (SUTURE) IMPLANT
SUT PROLENE 7 0 BV1 MDA (SUTURE) ×5 IMPLANT
SUT PROLENE 8 0 BV175 6 (SUTURE) ×2 IMPLANT
SUT SILK  1 MH (SUTURE) ×2
SUT SILK 1 MH (SUTURE) ×4 IMPLANT
SUT SILK 1 TIES 10X30 (SUTURE) ×3 IMPLANT
SUT SILK 2 0 (SUTURE) ×3
SUT SILK 2 0 SH CR/8 (SUTURE) ×6 IMPLANT
SUT SILK 2-0 18XBRD TIE 12 (SUTURE) ×2 IMPLANT
SUT SILK 3 0 (SUTURE) ×6
SUT SILK 3 0 SH CR/8 (SUTURE) ×3 IMPLANT
SUT SILK 3-0 18XBRD TIE 12 (SUTURE) IMPLANT
SUT SILK 4 0 (SUTURE) ×3
SUT SILK 4-0 18XBRD TIE 12 (SUTURE) ×2 IMPLANT
SUT STEEL 6MS V (SUTURE) IMPLANT
SUT STEEL STERNAL CCS#1 18IN (SUTURE) IMPLANT
SUT STEEL SZ 6 DBL 3X14 BALL (SUTURE) ×3 IMPLANT
SUT TEM PAC WIRE 2 0 SH (SUTURE) ×12 IMPLANT
SUT VIC AB 1 CTX 36 (SUTURE) ×6
SUT VIC AB 1 CTX36XBRD ANBCTR (SUTURE) ×4 IMPLANT
SUT VIC AB 2-0 CT1 27 (SUTURE) ×3
SUT VIC AB 2-0 CT1 TAPERPNT 27 (SUTURE) IMPLANT
SUT VIC AB 2-0 CTX 27 (SUTURE) IMPLANT
SUT VIC AB 3-0 SH 27 (SUTURE) ×3
SUT VIC AB 3-0 SH 27X BRD (SUTURE) ×2 IMPLANT
SUT VIC AB 3-0 X1 27 (SUTURE) IMPLANT
SUT VICRYL 4-0 PS2 18IN ABS (SUTURE) ×3 IMPLANT
SUTURE E-PAK OPEN HEART (SUTURE) ×3 IMPLANT
SYR CONTROL 10ML LL (SYRINGE) IMPLANT
SYSTEM SAHARA CHEST DRAIN ATS (WOUND CARE) ×3 IMPLANT
TAPE CLOTH SURG 4X10 WHT LF (GAUZE/BANDAGES/DRESSINGS) ×3 IMPLANT
TOWEL OR 17X24 6PK STRL BLUE (TOWEL DISPOSABLE) ×9 IMPLANT
TOWEL OR 17X26 10 PK STRL BLUE (TOWEL DISPOSABLE) ×9 IMPLANT
TRAY FOLEY CATH 14FRSI W/METER (CATHETERS) ×3 IMPLANT
TRAY FOLEY IC TEMP SENS 14FR (CATHETERS) ×3 IMPLANT
TUBE FEEDING 8FR 16IN STR KANG (MISCELLANEOUS) ×3 IMPLANT
TUBING INSUFFLATION 10FT LAP (TUBING) ×3 IMPLANT
UNDERPAD 30X30 INCONTINENT (UNDERPADS AND DIAPERS) ×3 IMPLANT
WATER STERILE IRR 1000ML POUR (IV SOLUTION) ×9 IMPLANT

## 2011-07-01 NOTE — OR Nursing (Signed)
Mistakenly entered the time out for CABG at 10:30; correct time out done at 10:26.

## 2011-07-01 NOTE — Brief Op Note (Addendum)
07/01/2011  1:46 PM  PATIENT:  Ellin Mayhew Beaston  71 y.o. male  PRE-OPERATIVE DIAGNOSIS:  CAD CAROTID STENOSIS  POST-OPERATIVE DIAGNOSIS:  coronary artery disease carotid stenosis  PROCEDURE:  Procedure(s) (LRB): CORONARY ARTERY BYPASS GRAFTING x5  LIMA to LAD  SVG to PDA ( with Endarterecotmy)  SVG to Diagonal  Sequential SVG to OM1, OM2  ENDOSCOPIC VEIN HARVEST RIGHT LEG ENDARTERECTOMY CAROTID (Right)  SURGEON:  Surgeon(s) and Role: Panel 1:    * Loreli Slot, MD - Primary  Panel 2:    * Sherren Kerns, MD - Primary  PHYSICIAN ASSISTANT:   Panel 1:  [name] PA-C  Panel 2: Clinton Gallant PA-C                                           ANESTHESIA:   general  EBL:  Total I/O In: 3000 [I.V.:3000] Out: 2200 [Urine:2200]  DRAINS: mediastinal chest drains   LOCAL MEDICATIONS USED:  NONE  SPECIMEN:  Source of Specimen:  PDA, Endarterectomy Plaque  DISPOSITION OF SPECIMEN:  PATHOLOGY  COUNTS:  YES  DICTATION: .Dragon Dictation  PLAN OF CARE: Admit to inpatient   PATIENT DISPOSITION:  ICU - intubated and hemodynamically stable.   XC- 90 min CPB- 126 min  Poor targets, good conduits TEE trace to 1+ MR Not a candidate for redo

## 2011-07-01 NOTE — OR Nursing (Signed)
Leg incision made at 1027.

## 2011-07-01 NOTE — Preoperative (Signed)
Beta Blockers   Reason not to administer Beta Blockers:Not Applicable 

## 2011-07-01 NOTE — Interval H&P Note (Signed)
History and Physical Interval Note:  07/01/2011 7:34 AM  Daniel Tyler  has presented today for surgery, with the diagnosis of CAD CAROTID STENOSIS  The various methods of treatment have been discussed with the patient and family. After consideration of risks, benefits and other options for treatment, the patient has consented to  Procedure(s) (LRB): CORONARY ARTERY BYPASS GRAFTING (CABG) (N/A) MITRAL VALVE REPAIR (MVR) (N/A) ENDARTERECTOMY CAROTID (Right) as a surgical intervention .  The patients' history has been reviewed, patient examined, no change in status, stable for surgery.  I have reviewed the patients' chart and labs.  Questions were answered to the patient's satisfaction.     Lollie Gunner C

## 2011-07-01 NOTE — Op Note (Signed)
Procedure Right carotid endarterectomy  Preoperative diagnosis: High-grade asymptomatic right internal carotid artery stenosis  Postoperative diagnosis: Same  Anesthesia General  Asst.: Lianne Cure, [facility]  Operative findings: #1 greater than 80% right internal carotid artery stenosis with calcified plaque           #2 10 French shunt                                #3 Dacron patch  Operative details: After obtaining informed consent, the patient was taken to the operating room. The patient was placed in a supine position on the operating room table. After induction of general anesthesia and endotracheal intubation a Foley catheter was placed. Next the patient's entire neck and chest was prepped and draped in the usual sterile fashion. An oblique incision was made on the right aspect of the patient's neck anterior to the border the right sternocleidomastoid muscle. The incision was carried into the subcutaneous tissues and through the platysma. The sternocleidomastoid muscle was identified and reflected laterally. The omohyoid muscle was identified and this was divided with cautery. The common carotid artery was then found at the base of the incision this was dissected free circumferentially. The vagus nerve was identified and protected. Dissection was then carried up to the level carotid bifurcation. The Ansa cervicalis was identified and traced up to its insertion into the hypoglossal nerve.  The ansa was divided at this level to assist with exposure.  The hyperglossal nerve was identified and protected. The internal carotid artery was dissected free circumferentially just below the level of the hypoglossal nerve and it was soft in character at this location and above any palpable disease. A vessel loop was placed around this. Next the external carotid and superior thyroid arteries were dissected free circumferentially and vessel loops were placed around these. The patient was given 7000 units of  intravenous heparin. After 2 minutes of circulation time and raising the mean arterial pressure to 85 mm mercury, the distal internal carotid artery was controlled with small bulldog clamp. The external carotid and superior thyroid arteries were controlled with vessel loops. The common carotid artery was controlled with a peripheral DeBakey clamp. A longitudinal opening was made in the common carotid artery just below the bifurcation. The arteriotomy was extended distally up into the internal carotid with Potts scissors. There was a large calcified plaque with greater than 80% stenosis at the carotid bifurcation. A tongue of plaque extended several centimeters into the internal carotid artery.  The arteriotomy was extended past the level of stenosis. Next a 10 [place] shunt was brought in the operative field was threaded into the distal internal carotid artery and allowed to backbleed thoroughly. There was brisk backbleeding from the internal carotid artery.  This shunt was then threaded into the common carotid artery and secured with a Rummel tourniquet. The shunt was inspected and found to be free of air. Flow was then restored to the brain after approximately 6 minutes of ischemia time.  Attention was then turned to the common carotid artery once again. A suitable endarterectomy plane was obtained and endarterectomy was begun in the common carotid artery and a good proximal endpoint was obtained. The arteries were quite thickened and diffusely diseased.  An eversion endarterectomy was performed on the external carotid artery and a good endpoint was obtained. The plaque was then elevated in the internal carotid artery and a nice feathered distal endpoint was also  obtained. The plaque was passed off the table as a specimen. All loose debris was then removed from the carotid bed and everything was thoroughly irrigated with heparinized saline. I was concerned that the distal endpoint was lifting slightly so it was  tacked on the posterior wall with four 7 0 prolene sutures.  A Dacron patch was then brought on to the operative field and this was sewn on as a patch angioplasty using a running 6-0 Prolene suture. Prior to completion of the anastomosis the shunt was reoccluded and brought down out of the distal internal carotid artery. The internal carotid artery was thoroughly backbled. This was then controlled again with small bulldog clamp. The shunt was then removed from the proximal common carotid artery and this is again secured with a peripheral DeBakey clamp after forward bleeding. The external carotid was also thoroughly backbled.  The remainder of the patch was completed and the anastomosis was secured. Flow was then restored first retrograde from the external carotid into the carotid bed then antegrade from the common carotid to the external carotid artery and after approximately 5 cardiac cycles to the internal carotid artery. Doppler was used to evaluate the external/internal and common carotid arteries and these all had good Doppler flow. Hemostasis was obtained with an additional repair stitch on the medial wall of the artery as well as thrombin and gelfoam.   Dr Dorris Fetch then came in to do the coronary artery portion of the procedure.  At conclusion of the case the thrombin and gelfoam were removed from the neck incision.  The platysma muscle was reapproximated using a running 3-0 Vicryl suture. The skin was closed with 4 0 Vicryl subcuticular stitch.  The patient was taken to the ICU in critical but stable condition.  Fabienne Bruns, MD [facility] Office: [phone] Pager: [phone]

## 2011-07-01 NOTE — Procedures (Signed)
Extubation Procedure Note  Patient Details:   Name: JOMEL WHITTLESEY DOB: 11/14/1940 MRN: [id number]   Airway Documentation:  Airway 8 mm (Active)     Airway 8 mm (Active)     Airway 8 mm (Active)  Secured at (cm) 23 cm 07/01/2011  8:30 PM  Measured From Lips 07/01/2011  8:30 PM  Secured Location Right 07/01/2011  8:30 PM  Secured By Pink Tape 07/01/2011  8:30 PM  Site Condition Dry 07/01/2011  8:30 PM     Airway 8 mm (Active)  Secured at (cm) 23 cm 07/01/2011 12:00 AM     Airway 8 mm (Active)  Secured at (cm) 25 cm 07/01/2011 11:00 PM  Measured From Lips 07/01/2011 11:00 PM  Secured Location Right 07/01/2011 11:00 PM  Secured By [employer] 07/01/2011 11:00 PM  Site Condition Dry 07/01/2011 11:00 PM    Evaluation  O2 sats: stable throughout Complications: No apparent complications Patient did tolerate procedure well. Bilateral Breath Sounds: Diminished Suctioning: Airway Yes Pt. Was extubated to a 4L [place] without any complications, dyspnea or stridor noted. Pt. Achieved a goal of 1.6L on VC & -20 on NIF. Pt. Was instructed on IS X 5. Highest goal achieved was . Pt.'s vitals are stable at this time.  Sheryle Hail 07/01/2011, 21:20 PM

## 2011-07-01 NOTE — H&P (View-Only) (Signed)
HPI:  [name] returns today to further discuss coronary bypass grafting. He had presented in February with a syncopal spell and was found to have severe three-vessel coronary disease and mild to moderate mitral regurgitation. He had also been diagnosed with pneumonia and sepsis that time it was not a candidate for surgery initially. I said was discharged he came back to see me a couple weeks ago which time he was a little ambivalent about having surgery. He did agree to have his carotid duplex and pulmonary function testing done. His carotid duplex showed a greater than 80% stenosis in the right internal carotid artery and he was seen in consultation by Dr. Darrick Penna who felt that he needed a carotid endarterectomy at the time of this bypass surgery. Past Medical History  Diagnosis Date  . Diabetes mellitus   . Enlarged heart   . Hyperlipidemia   . Hypertension   . Myocardial infarction   . Carotid stenosis      Current Outpatient Prescriptions  Medication Sig Dispense Refill  . ALPRAZolam (XANAX) 0.25 MG tablet Take 1 tablet (0.25 mg total) by mouth at bedtime as needed for sleep.  30 tablet  1  . budesonide-formoterol (SYMBICORT) 160-4.5 MCG/ACT inhaler Inhale 2 puffs into the lungs 2 (two) times daily.  1 Inhaler  2  . clopidogrel (PLAVIX) 75 MG tablet Take 1 tablet (75 mg total) by mouth daily with breakfast.  90 tablet  2  . enalapril (VASOTEC) 10 MG tablet Take 1 tablet (10 mg total) by mouth daily.  90 tablet  2  . FLUoxetine (PROZAC) 10 MG capsule Take 1 tablet qod  30 capsule  11  . furosemide (LASIX) 20 MG tablet Take 1 tablet (20 mg total) by mouth daily.  30 tablet  11  . glipiZIDE (GLUCOTROL) 10 MG tablet Take 1 tablet (10 mg total) by mouth 2 (two) times daily before a meal.  60 tablet  11  . insulin glargine (LANTUS) 100 UNIT/ML injection Inject 17 Units into the skin at bedtime.       . Menthol-Methyl Salicylate (MUSCLE RUB) 10-15 % CREA Apply 1 application topically as  needed.      . metFORMIN (GLUCOPHAGE) 1000 MG tablet Take 1 tablet (1,000 mg total) by mouth 2 (two) times daily with a meal.  60 tablet  11  . metoprolol tartrate (LOPRESSOR) 25 MG tablet Take 1 tablet (25 mg total) by mouth 2 (two) times daily.  60 tablet  11  . polysaccharide iron (NIFEREX) 150 MG CAPS capsule Take 150 mg by mouth daily.      . psyllium (HYDROCIL/METAMUCIL) 95 % PACK Take 1 packet by mouth as needed.  56 each    . simvastatin (ZOCOR) 40 MG tablet Take 1 tablet (40 mg total) by mouth daily at 6 PM.  90 tablet  2  . tiotropium (SPIRIVA) 18 MCG inhalation capsule Place 1 capsule (18 mcg total) into inhaler and inhale daily.  30 capsule    . VENTOLIN HFA 108 (90 BASE) MCG/ACT inhaler daily.        Physical Exam BP 114/72  Pulse 70  Resp 16  Ht 5\' 9"  (1.753 m)  Wt 142 lb (64.411 kg)  BMI 20.97 kg/m2  SpO2 98% Gen. thin 71 year old male in no acute distress Neurologic no focal deficits, more animated today than he was at his last visit Cardiac exam regular rate and rhythm normal S1 and S2 Lungs clear with equal breath sounds bilaterally Extremities no edema  Diagnostic Tests: Carotid duplex showed a greater than 80% right internal carotid stenosis  Impression: 71 year old gentleman with severe three-vessel coronary disease, mild to moderate mitral regurgitation by echocardiogram, and severe right internal carotid stenosis. He needs coronary bypass grafting for survival benefit. He is really minimally symptomatic presently. We will plan to do an intraoperative TEE to assess his mitral valve. He really doesn't have much of a murmur if at all, suspect we will have to do anything with his mitral valve but he does note a possibility it could be to be repaired. He is seen Dr. Darrick Penna in consultation and needs a right carotid endarterectomy at the time of CABG.  I once again discussed with him the indications, risks, benefits, and alternatives. He understands that the risks of  surgery which were also discussed his last visit include death, stroke, MI, bleeding, possible need for transfusion, infections, other organ system dysfunction point respiratory, renal, or GI complications.  We did discuss the nature of the procedure, incisions used, expected outcomes, hospital stay, and overall recovery. He does understand he will not be a would go directly home by himself after his hospitalization, will need some type of assisted living or rehabilitation temporarily before he is ready to be on his own again.  Plan:  Combined right carotid endarterectomy and coronary artery bypass grafting and possible mitral valve repair on Tuesday, May 7.

## 2011-07-01 NOTE — Anesthesia Postprocedure Evaluation (Signed)
  Anesthesia Post-op Note  Patient: Daniel Tyler  Procedure(s) Performed: Procedure(s) (LRB): CORONARY ARTERY BYPASS GRAFTING (CABG) (N/A) ENDARTERECTOMY CAROTID (Right)  Patient Location: SICU  Anesthesia Type: General  Level of Consciousness: Patient remains intubated per anesthesia plan  Airway and Oxygen Therapy: Patient remains intubated per anesthesia plan and Patient placed on Ventilator (see vital sign flow sheet for setting)  Post-op Pain: none  Post-op Assessment: Post-op Vital signs reviewed, Patient's Cardiovascular Status Stable, Respiratory Function Stable, Patent Airway, No signs of Nausea or vomiting and Pain level controlled  Post-op Vital Signs: Reviewed  Complications: No apparent anesthesia complications

## 2011-07-01 NOTE — Progress Notes (Signed)
Intubated, sedated BP 110/62  Pulse 79  Temp(Src) 98.2 F (36.8 C) (Core (Comment))  Resp 14  Ht 5\' 9"  (1.753 m)  Wt 141 lb (63.957 kg)  BMI 20.82 kg/m2  SpO2 100%  Intake/Output Summary (Last 24 hours) at 07/01/11 1731 Last data filed at 07/01/11 1700  Gross per 24 hour  Intake 6289.26 ml  Output   4910 ml  Net 1379.26 ml   Minimal CT output Doing well early postop

## 2011-07-01 NOTE — Progress Notes (Signed)
Paged Dr. Dorris Fetch at 0630 due to patient not having pre cabg dopplers. No response. Paged Dr. Dorris Fetch at [phone]- no response.  Paged Dr. Cornelius Moras at [phone]. Spoke with Dr. Cornelius Moras and reported that Dr. Sunday Corn patient did not have pre-cabg dopplers but did have his carotid dopplers done.  He stated that's all that matters.

## 2011-07-01 NOTE — Progress Notes (Signed)
Dr. Ivin Booty notified of patient not having pre-cabg dopplers, and later notified that Dr. Cornelius Moras aware and as long as carotid dopplers are done that's all that matters.

## 2011-07-01 NOTE — Progress Notes (Signed)
CTSP for respiratory distress. He was extubated around 10 PM. He was OK for a few minutes, then sats started dropping. RN was present when patient became apneic and eyes "rolled back in head." Started bagging immediately and called me at home. I instructed to intubate patient and came to hospital. On arrival ~ 5 minutes later, pt was already intubated and being bagged. His sats remained low and an ABG showed hypoxia and hypercarbia. Patient became hypotensive and required volume and increased neosynephrine. 2 cc of epi given when BP down to 50 and he had rebound hypertension. CXR showed ETT midline, but marked gastric dilatation. EzCAP showed no color change confirming ETT had become dislodged. Dr.Ossey from anesthesia present, and reintubated patient. + color change. Saturations immediately improved. OG placed to relieve gastric distention. Repeat ABG after reintubation showed resolution of hypercarbia and pO2 > 300. Will keep intubated overnight.

## 2011-07-01 NOTE — OR Nursing (Signed)
Time out for chest incision and chest incision at 11:16

## 2011-07-01 NOTE — H&P (View-Only) (Signed)
History of Present Illness:  Patient is a 71 y.o. year old male who presents for evaluation of carotid stenosis.  The patient denies symptoms of TIA, amaurosis, or stroke.  The patient is currently on Plavix antiplatelet therapy.  The carotid stenosis was found on screening duplex ultrasound.  Other medical problems include coronary artery disease, diabetes, and depressed ejection fraction, hyperlipidemia, hypertension, prior history of tobacco abuse.  These are currently stable.  Dr. Dorris Fetch is currently planning coronary bypass grafting for three-vessel coronary disease  Past Medical History  Diagnosis Date  . Diabetes mellitus   . Enlarged heart   . Hyperlipidemia   . Hypertension   . Myocardial infarction   . Carotid stenosis     History reviewed. No pertinent past surgical history.   Social History History  Substance Use Topics  . Smoking status: Former Smoker -- 1.0 packs/day for 20 years    Types: Cigarettes    Quit date: 02/24/1978  . Smokeless tobacco: Never Used  . Alcohol Use: No    Family History Family History  Problem Relation Age of Onset  . Heart disease Father   . Cancer Brother   . Diabetes Brother   . Diabetes Daughter     Allergies  No Known Allergies   Current Outpatient Prescriptions  Medication Sig Dispense Refill  . ALPRAZolam (XANAX) 0.25 MG tablet Take 1 tablet (0.25 mg total) by mouth at bedtime as needed for sleep.  30 tablet  1  . budesonide-formoterol (SYMBICORT) 160-4.5 MCG/ACT inhaler Inhale 2 puffs into the lungs 2 (two) times daily.  1 Inhaler  2  . clopidogrel (PLAVIX) 75 MG tablet Take 1 tablet (75 mg total) by mouth daily with breakfast.  90 tablet  2  . enalapril (VASOTEC) 10 MG tablet Take 1 tablet (10 mg total) by mouth daily.  90 tablet  2  . FLUoxetine (PROZAC) 10 MG capsule Take 1 tablet qod  30 capsule  11  . furosemide (LASIX) 20 MG tablet Take 1 tablet (20 mg total) by mouth daily.  30 tablet  11  . glipiZIDE (GLUCOTROL)  10 MG tablet Take 1 tablet (10 mg total) by mouth 2 (two) times daily before a meal.  60 tablet  11  . insulin glargine (LANTUS) 100 UNIT/ML injection Inject 17 Units into the skin at bedtime.       . Menthol-Methyl Salicylate (MUSCLE RUB) 10-15 % CREA Apply 1 application topically as needed.      . metFORMIN (GLUCOPHAGE) 1000 MG tablet Take 1 tablet (1,000 mg total) by mouth 2 (two) times daily with a meal.  60 tablet  11  . metoprolol tartrate (LOPRESSOR) 25 MG tablet Take 1 tablet (25 mg total) by mouth 2 (two) times daily.  60 tablet  11  . simvastatin (ZOCOR) 40 MG tablet Take 1 tablet (40 mg total) by mouth daily at 6 PM.  90 tablet  2  . tiotropium (SPIRIVA) 18 MCG inhalation capsule Place 1 capsule (18 mcg total) into inhaler and inhale daily.  30 capsule    . VENTOLIN HFA 108 (90 BASE) MCG/ACT inhaler daily.      . polysaccharide iron (NIFEREX) 150 MG CAPS capsule Take 150 mg by mouth daily.      . psyllium (HYDROCIL/METAMUCIL) 95 % PACK Take 1 packet by mouth as needed.  56 each      ROS:   General:  No weight loss, Fever, chills  HEENT: No recent headaches, no nasal bleeding, no visual changes,  no sore throat  Neurologic: No dizziness, blackouts, seizures. No recent symptoms of stroke or mini- stroke. No recent episodes of slurred speech, or temporary blindness.  Cardiac: No recent episodes of chest pain/pressure, no shortness of breath at rest.  +shortness of breath with exertion.  Denies history of atrial fibrillation or irregular heartbeat  Vascular: No history of rest pain in feet.  No history of claudication.  No history of non-healing ulcer, No history of DVT   Pulmonary: No home oxygen, + productive cough, no hemoptysis,  some time wheezing  Musculoskeletal:  [ ]  Arthritis, [ ]  Low back pain,  [ ]  Joint pain  Hematologic:No history of hypercoagulable state.  No history of easy bleeding.  No history of anemia  Gastrointestinal: No hematochezia or melena,  No  gastroesophageal reflux, no trouble swallowing  Urinary: [ ]  chronic Kidney disease, [ ]  on HD - [ ]  MWF or [ ]  TTHS, [ ]  Burning with urination, [ ]  Frequent urination, [ ]  Difficulty urinating;   Skin: No rashes  Psychological: No history of anxiety,  No history of depression   Physical Examination  Filed Vitals:   06/19/11 1300  BP: 128/58  Pulse: 75  Temp: 98.3 F (36.8 C)  TempSrc: Oral  Height: 5\' 9"  (1.753 m)  Weight: 142 lb (64.411 kg)    Body mass index is 20.97 kg/(m^2).  General:  Alert and oriented, no acute distress HEENT: Normal Neck: No bruit or JVD Pulmonary: Clear to auscultation bilaterally Cardiac: Regular Rate and Rhythm without murmur Gastrointestinal: Soft, non-tender, non-distended, no mass Skin: No rash Extremity Pulses:  2+ radial, brachial, femoral, absent dorsalis pedis, posterior tibial pulses bilaterally Musculoskeletal: No deformity or edema  Neurologic: Upper and lower extremity motor 5/5 and symmetric  DATA: I reviewed his carotid duplex exam from [facility] dated 06/09/2011. This showed a high-grade carotid stenosis on the right side with a peak systolic velocity of 429 cm/s. His falls and a greater than 80% category. He also had a moderate left internal carotid artery stenosis. He had antegrade vertebral flow bilaterally.   ASSESSMENT: Asymptomatic bilateral carotid stenosis. High-grade right internal carotid artery stenosis. I believe the patient would benefit from carotid endarterectomy for stroke prophylaxis around the time of his cardiac bypass grafting in a combined case. I discussed with the patient today the risks benefits possible complications and procedure details of carotid endarterectomy including but not noted to bleeding infection cranial nerve injury and stroke risk. Also informed the patient that his stroke risk is higher than the isolated carotid endarterectomy. However I also explained the patient that his stroke risk  is lower from coronary artery bypass grafting by having the carotid endarterectomy simultaneously.   PLAN:  Right carotid endarterectomy combined with cardiac bypass grafting. Our office will call Dr. Sunday Corn office to coordinate this. The patient is currently on Plavix. I will leave this to Dr. Sunday Corn discretion when he wishes to discontinue the Plavix before his operation. I would prefer that the patient be on antiplatelet in the form of aspirin at the time of operation.   Fabienne Bruns, MD [facility] Office: [phone] Pager: [phone]

## 2011-07-01 NOTE — Anesthesia Procedure Notes (Signed)
Procedure Name: Intubation Date/Time: 07/01/2011 8:01 AM Performed by: Glendora Score A Pre-anesthesia Checklist: Patient identified, Emergency Drugs available, Suction available and Patient being monitored Patient Re-evaluated:Patient Re-evaluated prior to inductionOxygen Delivery Method: Circle system utilized Preoxygenation: Pre-oxygenation with 100% oxygen Intubation Type: IV induction Ventilation: Mask ventilation without difficulty and Oral airway inserted - appropriate to patient size Laryngoscope Size: Hyacinth Meeker and 2 Grade View: Grade I Tube type: Oral Tube size: 8.0 mm Number of attempts: 1 Airway Equipment and Method: Stylet Placement Confirmation: ETT inserted through vocal cords under direct vision,  positive ETCO2 and breath sounds checked- equal and bilateral Secured at: 23 cm Tube secured with: Tape Dental Injury: Teeth and Oropharynx as per pre-operative assessment

## 2011-07-01 NOTE — Progress Notes (Signed)
Pt. sats dropping about 10 min. after extubation.  Pt. Alert and oriented x 3, and talking with me.  Sats down to 40's and pt stopped breathing.  Pt bagged on 100% right away and reintubated within a few minutes and Dr. Dorris Fetch called.  I will continue to monitor.

## 2011-07-01 NOTE — Transfer of Care (Signed)
Immediate Anesthesia Transfer of Care Note  Patient: Daniel Tyler  Procedure(s) Performed: Procedure(s) (LRB): CORONARY ARTERY BYPASS GRAFTING (CABG) (N/A) ENDARTERECTOMY CAROTID (Right)  Patient Location: SICU  Anesthesia Type: General  Level of Consciousness: Patient remains intubated per anesthesia plan  Airway & Oxygen Therapy: Patient remains intubated per anesthesia plan and Patient placed on Ventilator (see vital sign flow sheet for setting)  Post-op Assessment: Post -op Vital signs reviewed and stable  Post vital signs: Reviewed and stable  Complications: No apparent anesthesia complications

## 2011-07-01 NOTE — Interval H&P Note (Signed)
History and Physical Interval Note:  07/01/2011 7:27 AM  Daniel Tyler  has presented today for surgery, with the diagnosis of CAD CAROTID STENOSIS  The various methods of treatment have been discussed with the patient and family. After consideration of risks, benefits and other options for treatment, the patient has consented to  Procedure(s) (LRB): CORONARY ARTERY BYPASS GRAFTING (CABG) (N/A) MITRAL VALVE REPAIR (MVR) (N/A) ENDARTERECTOMY CAROTID (Right) as a surgical intervention .  The patients' history has been reviewed, patient examined, no change in status, stable for surgery.  I have reviewed the patients' chart and labs.  Questions were answered to the patient's satisfaction.     Adan Beal E

## 2011-07-01 NOTE — Anesthesia Preprocedure Evaluation (Addendum)
Anesthesia Evaluation  Patient identified by MRN, date of birth, ID band Patient awake    Reviewed: Allergy & Precautions, H&P , NPO status , Patient's Chart, lab work & pertinent test results, reviewed documented beta blocker date and time   Airway Mallampati: I TM Distance: >3 FB Neck ROM: Full    Dental  (+) Edentulous Upper and Edentulous Lower   Pulmonary  breath sounds clear to auscultation        Cardiovascular hypertension, Pt. on medications + CAD, + Past MI and + Peripheral Vascular Disease + Valvular Problems/Murmurs MR Rhythm:Regular Rate:Normal  EF - 30-35%, MR   Neuro/Psych Depression    GI/Hepatic   Endo/Other  Diabetes mellitus-, Type 1, Insulin Dependent and Oral Hypoglycemic Agents  Renal/GU      Musculoskeletal   Abdominal   Peds  Hematology   Anesthesia Other Findings   Reproductive/Obstetrics                       Anesthesia Physical Anesthesia Plan  ASA: IV  Anesthesia Plan: General   Post-op Pain Management:    Induction: Intravenous  Airway Management Planned: Oral ETT  Additional Equipment: Arterial line, CVP, PA Cath, Ultrasound Guidance Line Placement and TEE  Intra-op Plan:   Post-operative Plan: Post-operative intubation/ventilation  Informed Consent: I have reviewed the patients History and Physical, chart, labs and discussed the procedure including the risks, benefits and alternatives for the proposed anesthesia with the patient or authorized representative who has indicated his/her understanding and acceptance.   Dental advisory given  Plan Discussed with: CRNA, Anesthesiologist and Surgeon  Anesthesia Plan Comments:        Anesthesia Quick Evaluation

## 2011-07-02 ENCOUNTER — Inpatient Hospital Stay (HOSPITAL_COMMUNITY): Payer: Medicare Other

## 2011-07-02 ENCOUNTER — Encounter (HOSPITAL_COMMUNITY): Payer: Self-pay | Admitting: Thoracic Surgery (Cardiothoracic Vascular Surgery)

## 2011-07-02 LAB — POCT I-STAT 3, ART BLOOD GAS (G3+)
Acid-base deficit: 2 mmol/L (ref 0.0–2.0)
Acid-base deficit: 2 mmol/L (ref 0.0–2.0)
Bicarbonate: 21.9 mEq/L (ref 20.0–24.0)
O2 Saturation: 100 %
Patient temperature: 97.3
pO2, Arterial: 80 mmHg (ref 80.0–100.0)

## 2011-07-02 LAB — POCT I-STAT, CHEM 8
Creatinine, Ser: 0.7 mg/dL (ref 0.50–1.35)
HCT: 29 % — ABNORMAL LOW (ref 39.0–52.0)
Hemoglobin: 9.9 g/dL — ABNORMAL LOW (ref 13.0–17.0)
Potassium: 4 mEq/L (ref 3.5–5.1)
Sodium: 137 mEq/L (ref 135–145)

## 2011-07-02 LAB — BASIC METABOLIC PANEL
CO2: 22 mEq/L (ref 19–32)
Calcium: 8.5 mg/dL (ref 8.4–10.5)
Chloride: 105 mEq/L (ref 96–112)
Glucose, Bld: 116 mg/dL — ABNORMAL HIGH (ref 70–99)
Sodium: 135 mEq/L (ref 135–145)

## 2011-07-02 LAB — CBC
HCT: 27.5 % — ABNORMAL LOW (ref 39.0–52.0)
Hemoglobin: 9.1 g/dL — ABNORMAL LOW (ref 13.0–17.0)
Hemoglobin: 9.4 g/dL — ABNORMAL LOW (ref 13.0–17.0)
MCH: 29.2 pg (ref 26.0–34.0)
MCV: 83 fL (ref 78.0–100.0)
MCV: 84.6 fL (ref 78.0–100.0)
Platelets: 180 10*3/uL (ref 150–400)
RBC: 3.12 MIL/uL — ABNORMAL LOW (ref 4.22–5.81)
RBC: 3.25 MIL/uL — ABNORMAL LOW (ref 4.22–5.81)
WBC: 9.1 10*3/uL (ref 4.0–10.5)
WBC: 11.8 10*3/uL — ABNORMAL HIGH (ref 4.0–10.5)

## 2011-07-02 LAB — GLUCOSE, CAPILLARY
Glucose-Capillary: 118 mg/dL — ABNORMAL HIGH (ref 70–99)
Glucose-Capillary: 151 mg/dL — ABNORMAL HIGH (ref 70–99)

## 2011-07-02 LAB — CREATININE, SERUM: GFR calc Af Amer: >90 mL/min (ref >90)

## 2011-07-02 MED ORDER — POTASSIUM CHLORIDE 10 MEQ/50ML IV SOLN
10.0000 meq | INTRAVENOUS | Status: AC | PRN
  Administered 2011-07-02 (×3): 10 meq via INTRAVENOUS

## 2011-07-02 MED ORDER — CHLORHEXIDINE GLUCONATE 0.12 % MT SOLN
15.0000 mL | Freq: Two times a day (BID) | OROMUCOSAL | Status: DC
  Administered 2011-07-02: 15 mL via OROMUCOSAL

## 2011-07-02 MED ORDER — INSULIN ASPART 100 UNIT/ML ~~LOC~~ SOLN
0.0000 [IU] | SUBCUTANEOUS | Status: AC
  Administered 2011-07-02 (×2): 2 [IU] via SUBCUTANEOUS

## 2011-07-02 MED ORDER — POTASSIUM CHLORIDE 10 MEQ/50ML IV SOLN
INTRAVENOUS | Status: AC
  Filled 2011-07-02: qty 50

## 2011-07-02 MED ORDER — INSULIN ASPART 100 UNIT/ML ~~LOC~~ SOLN: 0.0000 [IU] | SUBCUTANEOUS | Status: DC

## 2011-07-02 MED ORDER — ENOXAPARIN SODIUM 40 MG/0.4ML ~~LOC~~ SOLN
40.0000 mg | SUBCUTANEOUS | Status: DC
  Administered 2011-07-02 – 2011-07-07 (×6): 40 mg via SUBCUTANEOUS
  Filled 2011-07-02 (×7): qty 0.4

## 2011-07-02 MED ORDER — INSULIN ASPART 100 UNIT/ML ~~LOC~~ SOLN
0.0000 [IU] | SUBCUTANEOUS | Status: DC
  Administered 2011-07-02: 4 [IU] via SUBCUTANEOUS
  Administered 2011-07-03: 2 [IU] via SUBCUTANEOUS

## 2011-07-02 MED ORDER — POTASSIUM CHLORIDE 10 MEQ/50ML IV SOLN
10.0000 meq | INTRAVENOUS | Status: AC
  Administered 2011-07-02 (×3): 10 meq via INTRAVENOUS

## 2011-07-02 MED ORDER — HYDROCORTISONE SOD SUCCINATE 100 MG IJ SOLR
100.0000 mg | Freq: Once | INTRAMUSCULAR | Status: AC
  Administered 2011-07-02: 100 mg via INTRAVENOUS
  Filled 2011-07-02: qty 2

## 2011-07-02 MED ORDER — BIOTENE DRY MOUTH MT LIQD
15.0000 mL | Freq: Four times a day (QID) | OROMUCOSAL | Status: DC
  Administered 2011-07-02: 15 mL via OROMUCOSAL

## 2011-07-02 MED ORDER — MUPIROCIN 2 % EX OINT
TOPICAL_OINTMENT | Freq: Two times a day (BID) | CUTANEOUS | Status: DC
  Administered 2011-07-02 – 2011-07-07 (×11): via NASAL
  Filled 2011-07-02: qty 22

## 2011-07-02 MED ORDER — INSULIN GLARGINE 100 UNIT/ML ~~LOC~~ SOLN
20.0000 [IU] | Freq: Every day | SUBCUTANEOUS | Status: DC
  Administered 2011-07-02 – 2011-07-07 (×5): 20 [IU] via SUBCUTANEOUS

## 2011-07-02 MED ORDER — FUROSEMIDE 10 MG/ML IJ SOLN
40.0000 mg | Freq: Once | INTRAMUSCULAR | Status: AC
  Administered 2011-07-02: 40 mg via INTRAVENOUS
  Filled 2011-07-02: qty 4

## 2011-07-02 MED FILL — Potassium Chloride Inj 2 mEq/ML: INTRAVENOUS | Qty: 40 | Status: AC

## 2011-07-02 MED FILL — Magnesium Sulfate Inj 50%: INTRAMUSCULAR | Qty: 10 | Status: AC

## 2011-07-02 NOTE — Progress Notes (Signed)
Due to duplicate orders in for KCL and SS insulin, pt would not scan properly today for those meds(also came up extra when pulled KCL from pyxis)

## 2011-07-02 NOTE — Progress Notes (Signed)
Clinical Social Work Department BRIEF PSYCHOSOCIAL ASSESSMENT 07/02/2011  Patient:  Daniel Tyler, Daniel Tyler     Account Number:  [id number]     Admit date:  07/01/2011  Clinical Social Worker:  Mee Hives  Date/Time:  07/02/2011 04:00 PM  Referred by:  Care Management  Date Referred:  07/02/2011 Referred for  SNF Placement   Other Referral:   Interview type:  Patient Other interview type:   CSW spoke with pt and pt friend/caregiver/POA Venia Carbon by phone    PSYCHOSOCIAL DATA Living Status:  ALONE Admitted from facility:   Level of care:   Primary support name:  Venia Carbon Primary support relationship to patient:  FRIEND Degree of support available:   Strong, but does not have 24 hour care at d/c    CURRENT CONCERNS Current Concerns  Post-Acute Placement   Other Concerns:    SOCIAL WORK ASSESSMENT / PLAN CSW met with pt to discuss disposition. Pt states his friend [name] will provide care at d/c. At time of interview, pt stated his friend could provide 24 hour care, however, CSW contacted [name] by phone to discuss disposition and he will not be able toprovide this level of care due to work obligations.Pt friend states he will discuss this with pt and requests CSW to pursue SNF placement at [facility] at this time.  CSW will initiate SNF search and will follow up with pt tomorrow.   Assessment/plan status:  Information/Referral to [facility] Other assessment/ plan:   SNF at d/c.  Pt friend to bring HCPOA paperwork to place in chart   Information/referral to community resources:    PATIENT'S/FAMILY'S RESPONSE TO PLAN OF CARE: Both pt and pt friend receptive to intervention and appreciative.

## 2011-07-02 NOTE — Progress Notes (Signed)
Patient ID: Daniel Tyler, male   DOB: January 07, 1941, 71 y.o.   MRN: [id number]  Filed Vitals:   07/02/11 1600 07/02/11 1700 07/02/11 1800 07/02/11 1900  BP: 93/51 85/48 99/51  103/54  Pulse: 80 79 79 80  Temp: 98 F (36.7 C)     TempSrc:      Resp: 18 16 21    Height:      Weight:      SpO2: 100% 98% 99% 99%   Has been awake and alert today. Urine output good  CBC    Component Value Date/Time   WBC 11.8* 07/02/2011 1545   RBC 3.25* 07/02/2011 1545   HGB 9.4* 07/02/2011 1545   HCT 27.5* 07/02/2011 1545   PLT 180 07/02/2011 1545   MCV 84.6 07/02/2011 1545   MCH 28.9 07/02/2011 1545   MCHC 34.2 07/02/2011 1545   RDW 15.7* 07/02/2011 1545   LYMPHSABS 2.4 12/09/2006 2011   MONOABS 0.8* 12/09/2006 2011   EOSABS 0.2 12/09/2006 2011   BASOSABS 0.1 12/09/2006 2011    BMET    Component Value Date/Time   NA 137 07/02/2011 1544   K 4.0 07/02/2011 1544   CL 102 07/02/2011 1544   CO2 22 07/02/2011 0354   GLUCOSE 165* 07/02/2011 1544   BUN 10 07/02/2011 1544   CREATININE 0.56 07/02/2011 1545   CREATININE 0.66 06/07/2011 1645   CALCIUM 8.5 07/02/2011 0354   GFRNONAA >90 07/02/2011 1545   GFRAA >90 07/02/2011 1545    A/P:  Stable day.  Continue present course.

## 2011-07-02 NOTE — Progress Notes (Signed)
1 Day Post-Op Procedure(s) (LRB): CORONARY ARTERY BYPASS GRAFTING (CABG) (N/A) ENDARTERECTOMY CAROTID (Right) Subjective: Intubated, but alert and following commands  Objective: Vital signs in last 24 hours: Temp:  [97 F (36.1 C)-100 F (37.8 C)] 97.9 F (36.6 C) (05/08 0800) Pulse Rate:  [79-110] 79  (05/08 0800) Cardiac Rhythm:  [-] Atrial paced (05/08 0737) Resp:  [10-34] 24  (05/08 0800) BP: (74-168)/(48-75) 100/62 mmHg (05/08 0800) SpO2:  [77 %-100 %] 100 % (05/08 0800) Arterial Line BP: (74-189)/(43-84) 119/58 mmHg (05/08 0800) FiO2 (%):  [4 %-80 %] 30 % (05/08 0743) Weight:  [141 lb (63.957 kg)-151 lb 14.4 oz (68.9 kg)] 151 lb 14.4 oz (68.9 kg) (05/08 0400)  Hemodynamic parameters for last 24 hours: PAP: (27-106)/(14-57) 32/17 mmHg CO:  [3.1 L/min-4.3 L/min] 3.3 L/min CI:  [1.8 L/min/m2-2.7 L/min/m2] 1.9 L/min/m2  Intake/Output from previous day: 05/07 0701 - 05/08 0700 In: 7839 [I.V.:5970; Blood:409; NG/GT:60; IV Piggyback:1400] Out: 7040 [Urine:4800; Blood:2000; Chest Tube:240] Intake/Output this shift: Total I/O In: 83.2 [I.V.:53.2; NG/GT:30] Out: -   General appearance: alert and cooperative Neurologic: intact Heart: regular rate and rhythm Lungs: clear to auscultation bilaterally  Lab Results:  Basename 07/02/11 0354 07/01/11 2332 07/01/11 2321  WBC 9.1 -- 9.7  HGB 9.1* 8.8* --  HCT 25.9* 26.0* --  PLT 170 -- 162   BMET:  Basename 07/02/11 0354 07/01/11 2332  NA 135 137  K 4.1 3.7  CL 105 104  CO2 22 --  GLUCOSE 116* 196*  BUN 10 10  CREATININE 0.49* 0.60  CALCIUM 8.5 --    PT/INR:  Basename 07/01/11 1600  LABPROT 17.6*  INR 1.42   ABG    Component Value Date/Time   PHART 7.436 07/02/2011 0403   HCO3 21.9 07/02/2011 0403   TCO2 23 07/02/2011 0403   ACIDBASEDEF 2.0 07/02/2011 0403   O2SAT 100.0 07/02/2011 0403   CBG (last 3)   Basename 07/02/11 0753 07/02/11 0359 07/02/11 0210  GLUCAP 101* 118* 166*    Assessment/Plan: S/P Procedure(s)  (LRB): CORONARY ARTERY BYPASS GRAFTING (CABG) (N/A) ENDARTERECTOMY CAROTID (Right) POD # 1 CABG/ R CEA CV- stable hemodynamics RESP- reintubated for acute on chronic respiratory failure, good gas exchange once ETT properly positioned. ? Sedation issue vs upper airway obstruction, I favor the latter. Will wean and try to extubate again, will give hydrocortisone to try to decrease any airway edema RENAL- lytes, creatinine OK- diurese ENDO- CBG well controlled    LOS: 1 day    Saarah Dewing C 07/02/2011

## 2011-07-02 NOTE — Progress Notes (Addendum)
VASCULAR AND VEIN SURGERY POST - OP CEA PROGRESS NOTE  Date of Surgery: 07/01/2011 Surgeon: Surgeon(s): Loreli Slot, MD Sherren Kerns, MD 1 Day Post-Op right Carotid Endarterectomy .  HPI: Daniel Tyler is a 71 y.o. male who is 1 Day Post-Op right Carotid Endarterectomy . Patient is doing well.  He is still intubated.  They did try extubation and had to re-intubate.  Right carotid dressing clean and dry.  Significant Diagnostic Studies: CBC Lab Results  Component Value Date   WBC 9.1 07/02/2011   HGB 9.1* 07/02/2011   HCT 25.9* 07/02/2011   MCV 83.0 07/02/2011   PLT 170 07/02/2011    BMET    Component Value Date/Time   NA 135 07/02/2011 0354   K 4.1 07/02/2011 0354   CL 105 07/02/2011 0354   CO2 22 07/02/2011 0354   GLUCOSE 116* 07/02/2011 0354   BUN 10 07/02/2011 0354   CREATININE 0.49* 07/02/2011 0354   CREATININE 0.66 06/07/2011 1645   CALCIUM 8.5 07/02/2011 0354   GFRNONAA >90 07/02/2011 0354   GFRAA >90 07/02/2011 0354    COAG Lab Results  Component Value Date   INR 1.42 07/01/2011   INR 1.00 06/27/2011   INR 1.05 04/24/2011   No results found for this basename: PTT      Intake/Output Summary (Last 24 hours) at 07/02/11 0730 Last data filed at 07/02/11 0700  Gross per 24 hour  Intake 7838.96 ml  Output   7040 ml  Net 798.96 ml    Physical Exam:  BP Readings from Last 3 Encounters:  07/02/11 108/63  07/02/11 108/63  06/27/11 138/85   Temp Readings from Last 3 Encounters:  07/02/11 97.3 F (36.3 C) Core (Comment)  07/02/11 97.3 F (36.3 C) Core (Comment)  06/27/11 97.1 F (36.2 C) Oral   SpO2 Readings from Last 3 Encounters:  07/02/11 100%  07/02/11 100%  06/27/11 95%   Pulse Readings from Last 3 Encounters:  07/02/11 79  07/02/11 79  06/27/11 66    Pt is Alert  intubated right Neck Wound is clean, dry, intact, no hematoma Active range of finger are intact Neuro exam 5/5 motor upper and lower extremities following commands   Assessment: Daniel Tyler  Daniel Tyler is a 71 y.o. male is S/P Right Carotid endarterectomy   Plan:  Continue current treatment Pending extubation ASA vs Plavix post op to be determined by Dr Dorris Fetch Moderate contralateral carotid stenosis will need follow up duplex in 6 months Will arrange outpt post op follow up in 2 weeks  Clinton Gallant [facility] [phone] 07/02/2011 7:30 AM   Fabienne Bruns, MD [facility] [place] Office: [phone] Pager: [phone]

## 2011-07-02 NOTE — Care Management Note (Signed)
    Page 1 of 1   07/02/2011     4:14:18 PM   CARE MANAGEMENT NOTE 07/02/2011  Patient:  Daniel Tyler, Daniel Tyler   Account Number:  [id number]  Date Initiated:  07/01/2011  Documentation initiated by:  Ronny Flurry  Subjective/Objective Assessment:   s/p CABG ON 07/01/11     Action/Plan:   PTA, PT INDEPENDENT, LIVES ALONE.  STATES FRIEND "JOE" CAN ASSIST WHO LIVES ACROSS THE STREET FROM HIM.  PT MAY NEED ST-SNF FOR REHAB IF NO CAREGIVER AVAILABLE.  WILL CONSULT CSW FOR POSSIBLE ST-SNF.  WILL FOLLOW.   Anticipated DC Date:  07/08/2011   Anticipated DC Plan:  SKILLED NURSING FACILITY  In-house referral  Clinical Social Worker         Choice offered to / List presented to:             Status of service:  In process, will continue to follow Medicare Important Message given?   (If response is "NO", the following Medicare IM given date fields will be blank) Date Medicare IM given:   Date Additional Medicare IM given:    Discharge Disposition:    Per UR Regulation:  Reviewed for med. necessity/level of care/duration of stay  If discussed at Long Length of Stay Meetings, dates discussed:    CommentsRosalita Chessman [phone]

## 2011-07-02 NOTE — Progress Notes (Signed)
Pt. Extubated per SICU rapid wean protocol (All parameters were met). Pt. Became unresponsive with increased HR, agonal respirations & decreased SAT's. Anesthesia was called to intubate. Pt. Began to get worse while waiting on anesthesia. Pt. Was intubated by RT with RN assisting at bedside. Pt. Was reintubated with an 8.0 ETT vocal cords were visualized. ETT was secured at 24cm @ the lip. ETCO2 detected positive color change, visualized chest rise & pt. had equal BBS. Within 5 minutes of being reintubated pt.'s BP decreased, HR increased, SAT's decreased. CXR showed that ETT was 6.5 cm above the carina. Pt. Was bagged with 100% O2 (with PEEP valve attached). Anesthesia was called back to reintubate. Pt. Was reintubated by anesthesia with an 8.0 ETT placed at 25 at the lip. CXR, ETCO2, chest rise and BBS confirmed ETT placement.

## 2011-07-02 NOTE — Progress Notes (Signed)
Clinical Social Work Department CLINICAL SOCIAL WORK PLACEMENT NOTE 07/02/2011  Patient:  [name], Daniel Tyler  Account Number:  [id number] Admit date:  07/01/2011  Clinical Social Worker:  Baxter Flattery, LCSWA  Date/time:  07/02/2011 04:00 PM  Clinical Social Work is seeking post-discharge placement for this patient at the following level of care:   SKILLED NURSING   (*CSW will update this form in Epic as items are completed)     Patient/family provided with [facility] Department of Clinical Social Work's list of facilities offering this level of care within the geographic area requested by the patient (or if unable, by the patient's family).    Patient/family informed of their freedom to choose among providers that offer the needed level of care, that participate in Medicare, Medicaid or managed care program needed by the patient, have an available bed and are willing to accept the patient.    Patient/family informed of [facility]' ownership interest in [facility], as well as of the fact that they are under no obligation to receive care at this facility.  PASARR submitted to EDS on  PASARR number received from EDS on   FL2 transmitted to all facilities in geographic area requested by pt/family on   FL2 transmitted to all facilities within larger geographic area on   Patient informed that his/her managed care company has contracts with or will negotiate with  certain facilities, including the following:     Patient/family informed of bed offers received:   Patient chooses bed at  Physician recommends and patient chooses bed at    Patient to be transferred to  on   Patient to be transferred to facility by   The following physician request were entered in Epic:   Additional Comments: Pt recently at [facility] for short term rehab and requesting placement at same

## 2011-07-02 NOTE — Procedures (Signed)
Extubation Procedure Note  Patient Details:   Name: Daniel Tyler DOB: [birth year] MRN: [id number]      Evaluation  O2 sats: stable throughout Complications: No apparent complications Patient did tolerate procedure well. Bilateral Breath Sounds: Clear;Diminished Suctioning: Airway Yes  Pt extubated at this time per MD order and tolerated well. No complications noted. No stridor noted. VC of 1.3L/min. VS stable: HR 96, RR 21, SPO2 99% on 2L [place], BP 124/69. Pt has strong, adequate cough and able to vocalize. RT will continue to monitor.    Loyal Jacobson [facility] 07/02/2011, 10:41 AM

## 2011-07-02 NOTE — Plan of Care (Signed)
Problem: Phase II Progression Outcomes Goal: Patient extubated within - Outcome: Completed/Met Date Met:  07/02/11 >12 hr

## 2011-07-02 NOTE — Op Note (Signed)
NAMETENNIS, MCKINNON NO.:  [id number]  MEDICAL RECORD NO.:  [id number]  LOCATION:  2304                         FACILITY:  [facility]  PHYSICIAN:  Salvatore Decent. Dorris Fetch, M.D.DATE OF BIRTH:  1940-10-30  DATE OF PROCEDURE:  07/01/2011 DATE OF DISCHARGE:                              OPERATIVE REPORT   PREOPERATIVE DIAGNOSIS:  Severe three-vessel coronary artery disease, mild-to-moderate mitral regurgitation.  POSTOPERATIVE DIAGNOSIS:  Severe three-vessel coronary artery disease, trace to 1+ mitral regurgitation.  PROCEDURE:  Median sternotomy, extracorporeal circulation, coronary artery bypass grafting x5 (left internal mammary artery to left anterior descending artery, saphenous vein graft to first diagonal, sequential saphenous vein graft to obtuse marginals 1 and 2, saphenous vein graft to posterior descending with endarterectomy of posterior descending), endoscopic vein harvest, right leg.  SURGEON:  Salvatore Decent. Dorris Fetch, MD  ASSISTANT:  [name], PA-C  ANESTHESIA:  General.  FINDINGS:  Good-quality conduits, poor-quality targets.  The posterior descending is diffusely diseased, required endarterectomy, good flow through the graft at the completion of the anastomosis.  Not a candidate for redo bypass grafting.  CLINICAL NOTE:  Mr. Huelsmann is a 71 year old gentleman who presented with a syncopal spell back in February.  He had a non-Q-wave MI and at that time, also had sepsis and pneumonia.  He was treated medically initially and referred for coronary artery bypass grafting after recovering.  The patient was felt to be a high risk candidate for bypass grafting, but the risk was not prohibitive.  He understood and accepted the risk of the procedure and wished to proceed with surgery.  During his preoperative evaluation, he was found to have tight right internal carotid stenosis.  He was seen in consultation by Dr. Fabienne Bruns, who recommended  concomitant carotid endarterectomy.  OPERATIVE NOTE:  Mr. Skoda was brought to the preop holding area on Jul 01, 2011, there, the Anesthesia Service placed a Swan-Ganz catheter and arterial blood pressure monitoring line.  He was taken to the operating room, anesthetized, and intubated.  Intravenous antibiotics were administered.  The right neck, chest, abdomen, and legs were prepped and draped in usual sterile fashion.  Dr. Fabienne Bruns performed a right carotid endarterectomy.  Please refer to the separately dictated note for details of the procedure.  After the carotid endarterectomy was completed, an incision made in the medial aspect of the right leg at the level of the knee.  The greater saphenous vein was harvested from midcalf to thigh endoscopically, it was a good-quality conduit.  Simultaneously, a median sternotomy was performed, then the left internal mammary artery was harvested using standard technique.  It was a good-quality conduit as well.  The patient had already been heparinized during the carotid endarterectomy.  The remainder of full heparin dose was given prior to opening the pericardium.  The pericardium was opened after confirming adequate anticoagulation with ACT measurement.  The aorta was cannulated via concentric 2-0 Ethibond pledgeted pursestring sutures.  A dual-stage venous cannula was placed via pursestring suture in the right atrial appendage. Cardiopulmonary bypass was instituted and the patient was cooled to 32 degrees Celsius.  The coronary arteries were inspected and anastomotic sites were chosen.  The conduits were inspected and cut to length.  A foam pad was placed in the pericardium to insulate the heart and protect the left phrenic nerve.  A temperature probe was placed in the myocardial septum and a cardioplegic cannula was placed in the ascending aorta.  The aorta was crossclamped.  The left ventricle was emptied via the aortic root  vent.  Cardiac arrest then was achieved with a combination of cold antegrade blood cardioplegia with topical iced saline.  After achieving a complete diastolic arrest and myocardial septal cooling to less than 10 degrees Celsius, the following distal anastomoses were performed.  First, a reverse saphenous vein graft was placed end-to-side to the posterior descending branch of the right coronary artery.  This was a diffusely diseased vessel.  The vessel was opened in an area that felt relatively free of plaque; however, probe would not pass distally from the site due to plaque throughout the distal portion of the vessel and then endarterectomy was performed to the plaque with plaque elevating easily and cutting out with a nice taper at the end.  The vein was then anastomosed to the endarterectomized vessel with a running 7-0 Prolene suture.  The graft flushed easily.  Cardioplegia was administered. There was good flow through the graft.  Next, a reverse saphenous vein graft was placed end-to-side to the first diagonal branch to the LAD.  This was a 1 mm poor-quality target.  The vein was good quality.  It was anastomosed end-to-side with a running 7- 0 Prolene suture.  There was satisfactory flow through the graft and good hemostasis with cardioplegia administration.  Next, a reverse saphenous vein graft was placed sequentially to obtuse marginals 1 and 2.  These were both 1 mm poor-quality target vessels.  A side-to-side anastomosis was performed to OM 1 and end-to-side to OM 2. A probe was passed easily proximally and distally from each anastomosis at its completion before tying the suture.  There was satisfactory flow and good hemostasis.  Next, the left internal mammary artery was brought through a window in the pericardium.  The distal end was beveled.  It was then anastomosed end-to-side to the distal LAD.  The LAD had significant plaque just proximal to the anastomosis, a  1.5-mm probe did pass distally.  The mammary was anastomosed end-to-side with a running 8-0 Prolene suture. At the completion of the mammary to LAD anastomosis, the bulldog clamps were briefly removed to inspect for hemostasis.  Immediate rapid septal rewarming was noted.  The bulldog clamp was replaced.  Additional cardioplegia was administered.  The vein grafts were cut to length.  The proximal vein graft anastomoses were performed to 4.0-mm punch aortotomies with running 6-0 Prolene sutures.  At the completion of the final proximal anastomosis, the patient was placed in Trendelenburg position.  Lidocaine was administered.  The aortic root was de-aired and the aortic crossclamp was removed.  The total crossclamp time was 90 minutes.  The patient required a single defibrillation with 10 joules with sinus rhythm thereafter.  While rewarming was completed, all proximal and distal anastomoses were inspected for hemostasis.  Epicardial pacing wires were placed on the right ventricle and right atrium.  When the patient was rewarmed to a core temperature of 37 degrees Celsius, he was weaned from cardiopulmonary bypass on the first attempt.  Total bypass time was 126 minutes.  The initial cardiac index was greater than 2 liters/minute/meter squared.  Postbypass transesophageal echocardiography revealed no change in the trace to 1+  mitral regurgitation, and overall preserved left ventricular function.  A test dose of protamine was administered and was well tolerated.  The atrial and aortic cannulae were removed.  The remainder of the protamine was administered without incident.  The chest was irrigated with warm saline.  Hemostasis was achieved.  The left pleural and single mediastinal chest tube were placed through separate subcostal incisions. The sternum was closed with interrupted heavy gauge double stainless steel wires.  The pectoralis fascia, subcutaneous tissue, and skin were closed  in standard fashion.  All sponge, needle, and instrument counts were correct at the end of the procedure.  The patient was taken from the operating room to the surgical intensive care unit in good condition.     Salvatore Decent Dorris Fetch, [name]     SCH/MEDQ  D:  07/01/2011  T:  07/02/2011  Job:  [id number]

## 2011-07-03 ENCOUNTER — Inpatient Hospital Stay (HOSPITAL_COMMUNITY): Payer: Medicare Other

## 2011-07-03 DIAGNOSIS — I10 Essential (primary) hypertension: Secondary | ICD-10-CM | POA: Insufficient documentation

## 2011-07-03 DIAGNOSIS — J449 Chronic obstructive pulmonary disease, unspecified: Secondary | ICD-10-CM | POA: Insufficient documentation

## 2011-07-03 DIAGNOSIS — D62 Acute posthemorrhagic anemia: Secondary | ICD-10-CM

## 2011-07-03 DIAGNOSIS — J439 Emphysema, unspecified: Secondary | ICD-10-CM | POA: Insufficient documentation

## 2011-07-03 LAB — BASIC METABOLIC PANEL
BUN: 12 mg/dL (ref 6–23)
CO2: 25 mEq/L (ref 19–32)
Calcium: 8.3 mg/dL — ABNORMAL LOW (ref 8.4–10.5)
Creatinine, Ser: 0.53 mg/dL (ref 0.50–1.35)
Potassium: 3.9 mEq/L (ref 3.5–5.1)
Sodium: 135 mEq/L (ref 135–145)

## 2011-07-03 LAB — CBC
HCT: 26 % — ABNORMAL LOW (ref 39.0–52.0)
MCH: 29 pg (ref 26.0–34.0)
MCHC: 33.8 g/dL (ref 30.0–36.0)
MCV: 85.8 fL (ref 78.0–100.0)
Platelets: 165 10*3/uL (ref 150–400)
RDW: 15.9 % — ABNORMAL HIGH (ref 11.5–15.5)

## 2011-07-03 LAB — GLUCOSE, CAPILLARY
Glucose-Capillary: 96 mg/dL (ref 70–99)
Glucose-Capillary: 120 mg/dL — ABNORMAL HIGH (ref 70–99)
Glucose-Capillary: 149 mg/dL — ABNORMAL HIGH (ref 70–99)
Glucose-Capillary: 143 mg/dL — ABNORMAL HIGH (ref 70–99)
Glucose-Capillary: 159 mg/dL — ABNORMAL HIGH (ref 70–99)

## 2011-07-03 MED ORDER — FUROSEMIDE 40 MG PO TABS
40.0000 mg | ORAL_TABLET | Freq: Every day | ORAL | Status: DC
  Administered 2011-07-03: 40 mg via ORAL
  Filled 2011-07-03 (×2): qty 1

## 2011-07-03 MED ORDER — CLOPIDOGREL BISULFATE 75 MG PO TABS
75.0000 mg | ORAL_TABLET | Freq: Every day | ORAL | Status: DC
  Administered 2011-07-03 – 2011-07-07 (×5): 75 mg via ORAL
  Filled 2011-07-03 (×7): qty 1

## 2011-07-03 MED ORDER — ALUM & MAG HYDROXIDE-SIMETH 200-200-20 MG/5ML PO SUSP
15.0000 mL | ORAL | Status: DC | PRN
  Administered 2011-07-04 (×2): 30 mL via ORAL
  Filled 2011-07-03 (×2): qty 30

## 2011-07-03 MED ORDER — GLIPIZIDE 10 MG PO TABS
10.0000 mg | ORAL_TABLET | Freq: Every day | ORAL | Status: DC
  Administered 2011-07-04 – 2011-07-07 (×4): 10 mg via ORAL
  Filled 2011-07-03 (×6): qty 1

## 2011-07-03 MED ORDER — MAGNESIUM HYDROXIDE 400 MG/5ML PO SUSP
30.0000 mL | Freq: Every day | ORAL | Status: DC | PRN
Start: 2011-07-03 — End: 2011-07-07

## 2011-07-03 MED ORDER — INSULIN ASPART 100 UNIT/ML ~~LOC~~ SOLN
0.0000 [IU] | Freq: Three times a day (TID) | SUBCUTANEOUS | Status: DC
  Administered 2011-07-03 – 2011-07-06 (×6): 2 [IU] via SUBCUTANEOUS
  Administered 2011-07-06: 4 [IU] via SUBCUTANEOUS
  Administered 2011-07-06 – 2011-07-07 (×3): 2 [IU] via SUBCUTANEOUS

## 2011-07-03 MED ORDER — ZOLPIDEM TARTRATE 5 MG PO TABS
5.0000 mg | ORAL_TABLET | Freq: Every evening | ORAL | Status: DC | PRN
  Administered 2011-07-04: 5 mg via ORAL
  Filled 2011-07-03: qty 1

## 2011-07-03 MED ORDER — TRAMADOL HCL 50 MG PO TABS
50.0000 mg | ORAL_TABLET | ORAL | Status: DC | PRN
  Administered 2011-07-06: 50 mg via ORAL
  Filled 2011-07-03: qty 1

## 2011-07-03 MED ORDER — ALBUTEROL SULFATE HFA 108 (90 BASE) MCG/ACT IN AERS: 2.0000 | INHALATION_SPRAY | Freq: Four times a day (QID) | RESPIRATORY_TRACT | Status: DC | PRN

## 2011-07-03 MED ORDER — MOVING RIGHT ALONG BOOK
Freq: Once | Status: AC
  Administered 2011-07-03: 15:00:00
  Filled 2011-07-03: qty 1

## 2011-07-03 MED ORDER — POTASSIUM CHLORIDE CRYS ER 20 MEQ PO TBCR
20.0000 meq | EXTENDED_RELEASE_TABLET | Freq: Two times a day (BID) | ORAL | Status: DC
  Administered 2011-07-03 (×2): 20 meq via ORAL
  Filled 2011-07-03 (×4): qty 1

## 2011-07-03 MED ORDER — SODIUM CHLORIDE 0.9 % IV SOLN: 250.0000 mL | INTRAVENOUS | Status: DC | PRN

## 2011-07-03 MED ORDER — SODIUM CHLORIDE 0.9 % IJ SOLN: 3.0000 mL | INTRAMUSCULAR | Status: DC | PRN

## 2011-07-03 MED ORDER — SODIUM CHLORIDE 0.9 % IJ SOLN
3.0000 mL | Freq: Two times a day (BID) | INTRAMUSCULAR | Status: DC
  Administered 2011-07-03 – 2011-07-07 (×6): 3 mL via INTRAVENOUS

## 2011-07-03 MED FILL — Lidocaine HCl IV Inj 20 MG/ML: INTRAVENOUS | Qty: 5 | Status: AC

## 2011-07-03 MED FILL — Sodium Chloride Irrigation Soln 0.9%: Qty: 3000 | Status: AC

## 2011-07-03 MED FILL — Sodium Chloride IV Soln 0.9%: INTRAVENOUS | Qty: 1000 | Status: AC

## 2011-07-03 MED FILL — Electrolyte-R (PH 7.4) Solution: INTRAVENOUS | Qty: 4000 | Status: AC

## 2011-07-03 MED FILL — Sodium Bicarbonate IV Soln 8.4%: INTRAVENOUS | Qty: 50 | Status: AC

## 2011-07-03 MED FILL — Heparin Sodium (Porcine) Inj 1000 Unit/ML: INTRAMUSCULAR | Qty: 30 | Status: AC

## 2011-07-03 MED FILL — Albumin, Human Inj 5%: INTRAVENOUS | Qty: 250 | Status: AC

## 2011-07-03 MED FILL — Mannitol IV Soln 20%: INTRAVENOUS | Qty: 500 | Status: AC

## 2011-07-03 MED FILL — Heparin Sodium (Porcine) Inj 1000 Unit/ML: INTRAMUSCULAR | Qty: 10 | Status: AC

## 2011-07-03 NOTE — Progress Notes (Signed)
This CSW handing off to Reece Levy to complete d/c planning for SNF placement.  Baxter Flattery, MSW [phone]

## 2011-07-03 NOTE — Progress Notes (Signed)
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

## 2011-07-03 NOTE — Progress Notes (Signed)
Transferred to 2023 via wheelchair.  Portable monitor and oxygen on. No changes.  Report given to Kathlene November, [place]

## 2011-07-03 NOTE — Progress Notes (Signed)
CARDIAC REHAB PHASE I   PRE:  Rate/Rhythm: 72 SR  BP:  Supine: 142/64  Sitting:   Standing:    SaO2: 92 3L  MODE:  Ambulation: 300 ft   POST:  Rate/Rhythem: 82  BP:  Supine:   Sitting: 130/80  Standing:    SaO2: 86 2L increased to 92 with rest and O2 3L 1425-1508 Assisted X 2 used walker and O2 2l to ambulate. Gait steady with walker. Pt DOE and c/o of back pain. O2 sat after walk 86% on O2 2L, with rest and O2 on 3L sat improved. Pt to recliner after walk with call light in reach.  Beatrix Fetters

## 2011-07-03 NOTE — Progress Notes (Signed)
2 Days Post-Op Procedure(s) (LRB): CORONARY ARTERY BYPASS GRAFTING (CABG) (N/A) ENDARTERECTOMY CAROTID (Right) Subjective: No complaints this AM Ambulated around unit yesterday  Objective: Vital signs in last 24 hours: Temp:  [97.9 F (36.6 C)-98.8 F (37.1 C)] 98.1 F (36.7 C) (05/09 0800) Pulse Rate:  [79-86] 83  (05/09 0759) Cardiac Rhythm:  [-] Atrial paced (05/09 0600) Resp:  [15-22] 19  (05/09 0759) BP: (79-124)/(44-69) 111/55 mmHg (05/09 0759) SpO2:  [92 %-100 %] 93 % (05/09 0759) Arterial Line BP: (80-141)/(42-62) 80/42 mmHg (05/08 1145) FiO2 (%):  [28 %-30 %] 28 % (05/08 2045) Weight:  [151 lb 10.8 oz (68.8 kg)] 151 lb 10.8 oz (68.8 kg) (05/09 0500)  Hemodynamic parameters for last 24 hours: PAP: (30-42)/(15-21) 37/15 mmHg  Intake/Output from previous day: 05/08 0701 - 05/09 0700 In: 1759.7 [P.O.:900; I.V.:529.7; NG/GT:30; IV Piggyback:300] Out: 1615 [Urine:1565; Chest Tube:50] Intake/Output this shift:    General appearance: alert and no distress Neurologic: intact Heart: regular rate and rhythm Lungs: clear to auscultation bilaterally Abdomen: normal findings: soft, non-tender  Lab Results:  Basename 07/03/11 0500 07/02/11 1545  WBC 10.5 11.8*  HGB 8.8* 9.4*  HCT 26.0* 27.5*  PLT 165 180   BMET:  Basename 07/03/11 0500 07/02/11 1545 07/02/11 1544 07/02/11 0354  NA 135 -- 137 --  K 3.9 -- 4.0 --  CL 102 -- 102 --  CO2 25 -- -- 22  GLUCOSE 98 -- 165* --  BUN 12 -- 10 --  CREATININE 0.53 0.56 -- --  CALCIUM 8.3* -- -- 8.5    PT/INR:  Basename 07/01/11 1600  LABPROT 17.6*  INR 1.42   ABG    Component Value Date/Time   PHART 7.429 07/02/2011 0900   HCO3 22.0 07/02/2011 0900   TCO2 20 07/02/2011 1544   ACIDBASEDEF 2.0 07/02/2011 0900   O2SAT 96.0 07/02/2011 0900   CBG (last 3)   Basename 07/03/11 0758 07/03/11 0410 07/02/11 2346  GLUCAP 120* 96 113*    Assessment/Plan: S/P Procedure(s) (LRB): CORONARY ARTERY BYPASS GRAFTING (CABG)  (N/A) ENDARTERECTOMY CAROTID (Right) Plan for transfer to step-down: see transfer orders POD # 2 CABG/ R CEA CV- stable RESP- continue IS RENAL- lytes, creatinine OK, continue diuresis ENDO- CBG well controlled, restart PO meds Continue ambulation   LOS: 2 days    Daniel Tyler C 07/03/2011

## 2011-07-03 NOTE — Progress Notes (Signed)
Physical Therapy Note: order received, pt just ambulated with cardiac rehab and unable to evaluate at this time.  Delaney Meigs, PT [phone]

## 2011-07-03 NOTE — Progress Notes (Signed)
Met with patient today upon his transfer to unit 2000. Discussed probale d/c needs for SNF and he prefers "rehab here'. I have asked for PT eval and will proceed with SNF search in case he isnot a candidate for CIR. He states he has been to [facility] in the past and would consider this if cannot go to CIR. FL2 placed in shadow chart for MD to review and sign. Will f/u tomorrow for further planning- Reece Levy, MSW, [employer] [phone]

## 2011-07-04 ENCOUNTER — Inpatient Hospital Stay (HOSPITAL_COMMUNITY): Payer: Medicare Other

## 2011-07-04 ENCOUNTER — Telehealth: Payer: Self-pay | Admitting: Vascular Surgery

## 2011-07-04 LAB — BASIC METABOLIC PANEL
Calcium: 8.6 mg/dL (ref 8.4–10.5)
Creatinine, Ser: 0.64 mg/dL (ref 0.50–1.35)
GFR calc Af Amer: >90 mL/min (ref >90)
GFR calc non Af Amer: >90 mL/min (ref >90)
Sodium: 129 mEq/L — ABNORMAL LOW (ref 135–145)

## 2011-07-04 LAB — CBC
MCH: 29.2 pg (ref 26.0–34.0)
MCV: 86.4 fL (ref 78.0–100.0)
Platelets: 210 10*3/uL (ref 150–400)
RBC: 3.08 MIL/uL — ABNORMAL LOW (ref 4.22–5.81)
RDW: 15.6 % — ABNORMAL HIGH (ref 11.5–15.5)

## 2011-07-04 LAB — GLUCOSE, CAPILLARY

## 2011-07-04 MED ORDER — METOPROLOL TARTRATE 12.5 MG HALF TABLET: 12.5000 mg | ORAL_TABLET | Freq: Two times a day (BID) | ORAL | Status: DC

## 2011-07-04 MED ORDER — POTASSIUM CHLORIDE CRYS ER 20 MEQ PO TBCR: 40.0000 meq | EXTENDED_RELEASE_TABLET | Freq: Every day | ORAL | Status: DC

## 2011-07-04 MED ORDER — ALBUTEROL SULFATE HFA 108 (90 BASE) MCG/ACT IN AERS
2.0000 | INHALATION_SPRAY | Freq: Four times a day (QID) | RESPIRATORY_TRACT | Status: AC
  Administered 2011-07-04: 2 via RESPIRATORY_TRACT
  Filled 2011-07-04 (×2): qty 6.7

## 2011-07-04 MED ORDER — TRAMADOL HCL 50 MG PO TABS: 50.0000 mg | ORAL_TABLET | ORAL | Status: DC | PRN

## 2011-07-04 MED ORDER — FUROSEMIDE 40 MG PO TABS
40.0000 mg | ORAL_TABLET | Freq: Two times a day (BID) | ORAL | Status: DC
  Administered 2011-07-04: 40 mg via ORAL
  Filled 2011-07-04 (×4): qty 1

## 2011-07-04 MED ORDER — OXYCODONE HCL 5 MG PO TABS: 5.0000 mg | ORAL_TABLET | ORAL | Status: DC | PRN

## 2011-07-04 MED ORDER — ALBUTEROL SULFATE HFA 108 (90 BASE) MCG/ACT IN AERS
2.0000 | INHALATION_SPRAY | Freq: Four times a day (QID) | RESPIRATORY_TRACT | Status: DC | PRN
  Administered 2011-07-05: 2 via RESPIRATORY_TRACT

## 2011-07-04 MED ORDER — POTASSIUM CHLORIDE CRYS ER 20 MEQ PO TBCR
40.0000 meq | EXTENDED_RELEASE_TABLET | Freq: Two times a day (BID) | ORAL | Status: DC
  Administered 2011-07-04 (×2): 40 meq via ORAL
  Filled 2011-07-04 (×3): qty 2

## 2011-07-04 NOTE — Telephone Encounter (Addendum)
Message copied by Rosalyn Charters on Fri Jul 04, 2011  3:05 PM ------      Message from: Melene Plan      Created: Fri Jul 04, 2011 12:17 PM                   ----- Message -----         From: Lars Mage, PA         Sent: 07/04/2011  10:32 AM           To: Melene Plan, RN            Right carotid endarterectomy f/u with Dr. Darrick Penna in 2 weeks.  Left voice mail for patient informing him of his 2 wk fu on 07-17-11 12:30pm

## 2011-07-04 NOTE — Progress Notes (Signed)
Subjective:  Appreciate Dr. Sunday Corn and Dr. Evelina Dun help.  He is sleepy this am from [place] and is hard to keep awake, but no SOB or chest pain.  Objective:  Vital Signs in the last 24 hours: BP 132/75  Pulse 65  Temp(Src) 97.4 F (36.3 C) (Oral)  Resp 20  Ht 5\' 9"  (1.753 m)  Wt 68.6 kg (151 lb 3.8 oz)  BMI 22.33 kg/m2  SpO2 91%  Physical Exam: Lethargic WM in NAD Lungs:  Decreased BS Cardiac:  Regular rhythm, normal S1 and S2, no S3 Extremities:  No edema present  Intake/Output from previous day: 05/09 0701 - 05/10 0700 In: 313 [P.O.:240; I.V.:23; IV Piggyback:50] Out: 410 [Urine:410] Weight Filed Weights   07/02/11 0400 07/03/11 0500 07/04/11 0444  Weight: 68.9 kg (151 lb 14.4 oz) 68.8 kg (151 lb 10.8 oz) 68.6 kg (151 lb 3.8 oz)    Lab Results: Basic Metabolic Panel:  Basename 07/03/11 0500 07/02/11 1545 07/02/11 1544 07/02/11 0354  NA 135 -- 137 --  K 3.9 -- 4.0 --  CL 102 -- 102 --  CO2 25 -- -- 22  GLUCOSE 98 -- 165* --  BUN 12 -- 10 --  CREATININE 0.53 0.56 -- --    CBC:  Basename 07/03/11 0500 07/02/11 1545  WBC 10.5 11.8*  NEUTROABS -- --  HGB 8.8* 9.4*  HCT 26.0* 27.5*  MCV 85.8 84.6  PLT 165 180    Telemetry: Sinus rhythm  Assessment/Plan:  1. Doing well post CABG/CEA  I will need to see in 2 weeks after d/c.  I have not seen him in my office since his original hospitalization.  Darden Palmer  MD [facility]  07/04/2011, 8:18 AM

## 2011-07-04 NOTE — Progress Notes (Signed)
SNF bed available for patient at [facility] [place]- possibly Monday pending patient is stable for SNF by MD. Will f/u Monday- Reece Levy, MSW, Theresia Majors [phone]

## 2011-07-04 NOTE — Progress Notes (Signed)
1530 Pt still sleepy from sleeping pill. We followup tomorrow.Natayla Cadenhead DunlapRN

## 2011-07-04 NOTE — Evaluation (Signed)
Physical Therapy Evaluation Patient Details Name: Daniel Tyler MRN: [id number] DOB: Jun 11, 1940 Today's Date: 07/04/2011 Time: 4098-1191 PT Time Calculation (min): 22 min  PT Assessment / Plan / Recommendation Clinical Impression  Pt s/p CABG and CEA with limited participation today due to decreased arousal after receiving Ambien late last night. Pt will benefit from acute therapy to maximize transfers, mobility and gait to decrease burden of care at discharge.     PT Assessment  Patient needs continued PT services    Follow Up Recommendations  Skilled nursing facility    Barriers to Discharge Other (comment) pt unable to state if any caregiver available    lEquipment Recommendations  Defer to next venue    Recommendations for Other Services     Frequency Min 3X/week    Precautions / Restrictions Precautions Precautions: Sternal;Fall   Pertinent Vitals/Pain No pain, pt lethargic limiting activity      Mobility  Bed Mobility Bed Mobility: Rolling Right;Right Sidelying to Sit Rolling Right: 4: Min assist Right Sidelying to Sit: 4: Min assist;HOB flat Details for Bed Mobility Assistance: cueing for sequence and precautions  Transfers Transfers: Sit to Stand;Stand to Sit;Stand Pivot Transfers Sit to Stand: 3: Mod assist;From bed Stand to Sit: 3: Mod assist;To chair/3-in-1 Stand Pivot Transfers: 3: Mod assist Details for Transfer Assistance: Pt with hand over hand cueing to maintain sequence adhering to precautions. Pt with decreased arousal even in standing and required assist to direct RW and tactile cueing to back to surface pivoting bed to recliner. Cueing for sequence and precautions throughout. Ambulation/Gait Ambulation/Gait Assistance: Not tested (comment) Stairs: No    Exercises     PT Diagnosis: Difficulty walking;Abnormality of gait;Altered mental status  PT Problem List: Decreased cognition;Decreased knowledge of use of DME;Decreased activity  tolerance;Decreased balance;Decreased mobility;Decreased knowledge of precautions PT Treatment Interventions: Gait training;DME instruction;Stair training;Functional mobility training;Therapeutic activities;Therapeutic exercise;Patient/family education;Cognitive remediation   PT Goals Acute Rehab PT Goals PT Goal Formulation: With patient Time For Goal Achievement: 07/18/11 Potential to Achieve Goals: Good Pt will go Supine/Side to Sit: with supervision PT Goal: Supine/Side to Sit - Progress: Goal set today Pt will go Sit to Supine/Side: with supervision PT Goal: Sit to Supine/Side - Progress: Goal set today Pt will go Sit to Stand: with supervision PT Goal: Sit to Stand - Progress: Goal set today Pt will go Stand to Sit: with supervision PT Goal: Stand to Sit - Progress: Goal set today Pt will Ambulate: >150 feet;with supervision;with least restrictive assistive device PT Goal: Ambulate - Progress: Goal set today Pt will Go Up / Down Stairs: 3-5 stairs;with least restrictive assistive device;with min assist PT Goal: Up/Down Stairs - Progress: Goal set today Additional Goals Additional Goal #1: Pt will independently state and demonstrate adherence to sternal precautions PT Goal: Additional Goal #1 - Progress: Goal set today  Visit Information  Last PT Received On: 07/04/11 Assistance Needed: +1    Subjective Data  Subjective: i'm awake Patient Stated Goal: pt unable to state   Prior Functioning  Home Living Lives With: Alone Type of Home: House Home Access: Stairs to enter Secretary/administrator of Steps: 4 Home Layout: Multi-level Bathroom Toilet: Standard Prior Function Level of Independence: Independent Able to Take Stairs?: Yes Driving: Yes Vocation: Retired Comments: Pt unable to fully provide PLOF due to lethargy    Cognition  Overall Cognitive Status: Impaired Area of Impairment: Attention;Memory;Following commands Arousal/Alertness: Lethargic Orientation  Level: Disoriented to;Time;Situation Behavior During Session: Lethargic Current Attention Level: Other (comment) (pt  unable to maintain arousal >15sec) Memory: Decreased recall of precautions    Extremity/Trunk Assessment Right Lower Extremity Assessment RLE ROM/Strength/Tone: [facility] for tasks assessed;Unable to fully assess;Due to impaired cognition Left Lower Extremity Assessment LLE ROM/Strength/Tone: [facility] for tasks assessed;Unable to fully assess;Due to impaired cognition   Balance Balance Balance Assessed: Yes Static Sitting Balance Static Sitting - Balance Support: Feet supported;No upper extremity supported Static Sitting - Level of Assistance: 4: Min assist Static Sitting - Comment/# of Minutes: pt with left lateral lean in sitting with cueing to correct, due to decreased arousal  End of Session PT - End of Session Equipment Utilized During Treatment: Gait belt Activity Tolerance: Other (comment) Patient left: in chair;with call bell/phone within reach;with chair alarm set Nurse Communication: Mobility status   Delorse Lek 07/04/2011, 9:00 AM  Delaney Meigs, PT [phone]

## 2011-07-04 NOTE — Progress Notes (Signed)
VASCULAR AND VEIN SURGERY POST - OP CEA PROGRESS NOTE  Date of Surgery: 07/01/2011 Surgeon: Surgeon(s): Loreli Slot, MD Sherren Kerns, MD 3 Days Post-Op right Carotid Endarterectomy .  HPI: Daniel Tyler is a 71 y.o. male who is 3 Days Post-Op right Carotid Endarterectomy . Patient is doing well. Pre-operative symptoms are Improved Patient denies headache; Patient denies difficulty swallowing; denies weakness in upper or lower extremities; Pt. denies other symptoms of stroke or TIA.  IMAGING: Dg Chest 2 View  07/04/2011  *RADIOLOGY REPORT*  Clinical Data: Post CABG.  CHEST - 2 VIEW  Comparison: 07/03/2011  Findings: The left jugular central venous catheter has been removed.  There are bibasilar densities suggestive for small bilateral pleural effusions and compressive atelectasis. Epicardial pacer wires are present. Median sternotomy wires are intact.  No evidence for a pneumothorax. No significant edema.  IMPRESSION: Small bilateral pleural effusions. Right basilar densities most likely represent atelectasis.  Original Report Authenticated By: Richarda Overlie, [name]   Dg Chest Portable 1 View In Am  07/03/2011  *RADIOLOGY REPORT*  Clinical Data: Status post CABG.  PORTABLE CHEST - 1 VIEW  Comparison: Chest 07/02/2011.  Findings: Left IJ sheath remains in place.  Swan-Ganz catheter, chest tubes and endotracheal tube have all been removed.  No pneumothorax.  There are small bilateral pleural effusions and basilar atelectasis.  Heart size is normal.  IMPRESSION:  1.  Small bilateral pleural effusions and basilar atelectasis, greater on the right. 2.  Negative for pneumothorax after chest tube removal.  Original Report Authenticated By: Bernadene Bell. Maricela Curet, [name]    Significant Diagnostic Studies: CBC Lab Results  Component Value Date   WBC 10.5 07/03/2011   HGB 8.8* 07/03/2011   HCT 26.0* 07/03/2011   MCV 85.8 07/03/2011   PLT 165 07/03/2011    BMET    Component Value Date/Time   NA 135  07/03/2011 0500   K 3.9 07/03/2011 0500   CL 102 07/03/2011 0500   CO2 25 07/03/2011 0500   GLUCOSE 98 07/03/2011 0500   BUN 12 07/03/2011 0500   CREATININE 0.53 07/03/2011 0500   CREATININE 0.66 06/07/2011 1645   CALCIUM 8.3* 07/03/2011 0500   GFRNONAA >90 07/03/2011 0500   GFRAA >90 07/03/2011 0500    COAG Lab Results  Component Value Date   INR 1.42 07/01/2011   INR 1.00 06/27/2011   INR 1.05 04/24/2011   No results found for this basename: PTT      Intake/Output Summary (Last 24 hours) at 07/04/11 1030 Last data filed at 07/03/11 2244  Gross per 24 hour  Intake    123 ml  Output    375 ml  Net   -252 ml    Physical Exam:  BP Readings from Last 3 Encounters:  07/04/11 132/75  07/04/11 132/75  06/27/11 138/85   Temp Readings from Last 3 Encounters:  07/04/11 97.4 F (36.3 C) Oral  07/04/11 97.4 F (36.3 C) Oral  06/27/11 97.1 F (36.2 C) Oral   SpO2 Readings from Last 3 Encounters:  07/04/11 94%  07/04/11 94%  06/27/11 95%   Pulse Readings from Last 3 Encounters:  07/04/11 65  07/04/11 65  06/27/11 66    Pt is A&O x 3 Gait is normal Speech is normal right Neck Wound is healing well Patient with Negative tongue deviation and Negative facial droop Pt has good and equal strength in all extremities  Assessment: Daniel Tyler is a 71 y.o. male is S/P Right  Carotid endarterectomy Pt is voiding, ambulating and taking po well   Plan: Discharge to: Home Follow-up in 2 weeks with Dr. Darrick Penna.   Clinton Gallant [facility] [phone] 07/04/2011 10:30 AM

## 2011-07-04 NOTE — Progress Notes (Addendum)
[address]            [place] [address]          [phone]     3 Days Post-Op  Procedure(s) (LRB): CORONARY ARTERY BYPASS GRAFTING (CABG) (N/A) ENDARTERECTOMY CAROTID (Right) Subjective: Feeling well, no pain, no SOB  Objective  Telemetry NSR   Temp:  [97 F (36.1 C)-100.2 F (37.9 C)] 97.4 F (36.3 C) (05/10 0444) Pulse Rate:  [65-88] 65  (05/10 0444) Resp:  [19-20] 20  (05/10 0444) BP: (111-152)/(53-79) 132/75 mmHg (05/10 0444) SpO2:  [91 %-95 %] 91 % (05/10 0444) Weight:  [151 lb 3.8 oz (68.6 kg)] 151 lb 3.8 oz (68.6 kg) (05/10 0444)   Intake/Output Summary (Last 24 hours) at 07/04/11 0755 Last data filed at 07/03/11 2244  Gross per 24 hour  Intake    313 ml  Output    410 ml  Net    -97 ml       General appearance: alert, cooperative and no distress Heart: regular rate and rhythm Lungs: fair air exchange, + exp wheeze Abdomen: soft, mild dist, nontender Extremities: minor LE edema Wound: incis healing well  Lab Results:  Basename 07/03/11 0500 07/02/11 1545 07/02/11 1544 07/02/11 0354  NA 135 -- 137 --  K 3.9 -- 4.0 --  CL 102 -- 102 --  CO2 25 -- -- 22  GLUCOSE 98 -- 165* --  BUN 12 -- 10 --  CREATININE 0.53 0.56 -- --  CALCIUM 8.3* -- -- 8.5  MG -- 1.9 -- 2.1  PHOS -- -- -- --   No results found for this basename: AST:2,ALT:2,ALKPHOS:2,BILITOT:2,PROT:2,ALBUMIN:2 in the last 72 hours No results found for this basename: LIPASE:2,AMYLASE:2 in the last 72 hours  Basename 07/03/11 0500 07/02/11 1545  WBC 10.5 11.8*  NEUTROABS -- --  HGB 8.8* 9.4*  HCT 26.0* 27.5*  MCV 85.8 84.6  PLT 165 180   No results found for this basename: CKTOTAL:4,CKMB:4,TROPONINI:4 in the last 72 hours No components found with this basename: POCBNP:3 No results found for this basename: DDIMER in the last 72 hours No results found for this basename: HGBA1C in the last 72 hours No results found for this basename:  CHOL,HDL,LDLCALC,TRIG,CHOLHDL in the last 72 hours No results found for this basename: TSH,T4TOTAL,FREET3,T3FREE,THYROIDAB in the last 72 hours No results found for this basename: VITAMINB12,FOLATE,FERRITIN,TIBC,IRON,RETICCTPCT in the last 72 hours  Medications: Scheduled    . acetaminophen  1,000 mg Oral Q6H   Or  . acetaminophen (TYLENOL) oral liquid 160 mg/5 mL  975 mg Per Tube Q6H  . aspirin EC  325 mg Oral Daily   Or  . aspirin  324 mg Per Tube Daily  . bisacodyl  10 mg Oral Daily   Or  . bisacodyl  10 mg Rectal Daily  . budesonide-formoterol  2 puff Inhalation BID  . cefUROXime (ZINACEF)  IV  1.5 g Intravenous Q12H  . clopidogrel  75 mg Oral Q breakfast  . docusate sodium  200 mg Oral Daily  . enoxaparin  40 mg Subcutaneous Q24H  . FLUoxetine  10 mg Oral Daily  . furosemide  40 mg Oral Daily  . glipiZIDE  10 mg Oral QAC breakfast  . insulin aspart  0-24 Units Subcutaneous TID AC & HS  . insulin glargine  20 Units Subcutaneous QPC breakfast  . iron polysaccharides  150 mg  Oral Daily  . metoprolol tartrate  12.5 mg Oral BID   Or  . metoprolol tartrate  12.5 mg Per Tube BID  . moving right along book   Does not apply Once  . mupirocin ointment   Nasal BID  . pantoprazole  40 mg Oral Q1200  . potassium chloride  20 mEq Oral BID  . simvastatin  40 mg Oral q1800  . sodium chloride  3 mL Intravenous Q12H  . tiotropium  18 mcg Inhalation Daily  . DISCONTD: insulin aspart  0-24 Units Subcutaneous Q4H  . DISCONTD: insulin regular  0-10 Units Intravenous TID WC  . DISCONTD: sodium chloride  3 mL Intravenous Q12H     Radiology/Studies:  Dg Chest Portable 1 View In Am  07/03/2011  *RADIOLOGY REPORT*  Clinical Data: Status post CABG.  PORTABLE CHEST - 1 VIEW  Comparison: Chest 07/02/2011.  Findings: Left IJ sheath remains in place.  Swan-Ganz catheter, chest tubes and endotracheal tube have all been removed.  No pneumothorax.  There are small bilateral pleural effusions and  basilar atelectasis.  Heart size is normal.  IMPRESSION:  1.  Small bilateral pleural effusions and basilar atelectasis, greater on the right. 2.  Negative for pneumothorax after chest tube removal.  Original Report Authenticated By: Bernadene Bell. Maricela Curet, [name]    INR: Will add last result for INR, ABG once components are confirmed Will add last 4 CBG results once components are confirmed  Assessment/Plan: S/P CABG/CEA  1. Cont symbicort, may need  Other agent if wheeze persists, asymptomatic. pulm toilet 2 gentle diuresis 3 sugars good control 4 rehab- routine   LOS: 3 days    Tyler,Daniel E 5/10/20137:55 AM    Patient seen and examined. Agree with above

## 2011-07-05 LAB — TYPE AND SCREEN
ABO/RH(D): A POS
Unit division: 0
Unit division: 0
Unit division: 0

## 2011-07-05 MED ORDER — ENALAPRIL MALEATE 10 MG PO TABS
10.0000 mg | ORAL_TABLET | Freq: Every day | ORAL | Status: DC
  Administered 2011-07-05 – 2011-07-07 (×3): 10 mg via ORAL
  Filled 2011-07-05 (×3): qty 1

## 2011-07-05 MED ORDER — POTASSIUM CHLORIDE CRYS ER 20 MEQ PO TBCR
40.0000 meq | EXTENDED_RELEASE_TABLET | Freq: Every day | ORAL | Status: DC
  Administered 2011-07-05 – 2011-07-07 (×3): 40 meq via ORAL
  Filled 2011-07-05 (×2): qty 2

## 2011-07-05 MED ORDER — FUROSEMIDE 40 MG PO TABS
40.0000 mg | ORAL_TABLET | Freq: Every day | ORAL | Status: DC
  Administered 2011-07-06 – 2011-07-07 (×2): 40 mg via ORAL
  Filled 2011-07-05 (×2): qty 1

## 2011-07-05 NOTE — Progress Notes (Signed)
CARDIAC REHAB PHASE I   PRE:  Rate/Rhythm:Sr 77   BP:  Supine:    Sitting: 148/76  Standing:    SaO2: 77 (nasal cannula was not in his nares - pt asleep) inc to 90 with 4lncc  MODE:  Ambulation: 150 ft   POST:  Rate/Rhythem: 87  BP:  Supine:   Sitting: 156/74  Standing:    SaO2: 78 4lnc inc to 93 with rest and purse lip breathing  Daniel Tyler Ambulated x 2 assist with rolling walker. Pt reported tied legs and felt weak.  Pt encouraged to purse lip breath and use Incentive spirometry to maintain O2 sats. Pt instructed to breath in through his nose verses his mouth.  Pt needed reminders to comply.  Pt to chair with lunch table.  Pt reminded to call nurse for any needs. Call bell in lap.

## 2011-07-05 NOTE — Progress Notes (Signed)
Physical Therapy Treatment Patient Details Name: Daniel Tyler MRN: [id number] DOB: [birth year] Today's Date: 07/05/2011 Time: 2952-8413 PT Time Calculation (min): 15 min  PT Assessment / Plan / Recommendation Comments on Treatment Session  Pt more awake today.  Fatigues quickly. Improving mobility but still needs ST-SNF.    Follow Up Recommendations  Skilled nursing facility    Barriers to Discharge        Equipment Recommendations  Defer to next venue    Recommendations for Other Services    Frequency Min 3X/week   Plan Discharge plan remains appropriate    Precautions / Restrictions Precautions Precautions: Sternal;Fall   Pertinent Vitals/Pain Pt walked on 3L of O2.    Mobility  Bed Mobility Bed Mobility: Sit to Supine Sit to Supine: 4: Min assist;HOB flat Details for Bed Mobility Assistance: assist to bring feet up Transfers Sit to Stand: With upper extremity assist;4: Min assist;From chair/3-in-1 Stand to Sit: 4: Min assist;To chair/3-in-1;With upper extremity assist Details for Transfer Assistance: Pt pushing up from chair with UE's despite verbal and tactile cues. Ambulation/Gait Ambulation/Gait Assistance: 4: Min guard Ambulation Distance (Feet): 90 Feet Assistive device: Rolling walker Gait Pattern: Decreased step length - left;Decreased stance time - right;Trunk flexed;Shuffle Gait velocity: slow cadence    Exercises     PT Diagnosis:    PT Problem List:   PT Treatment Interventions:     PT Goals Acute Rehab PT Goals PT Goal: Sit to Supine/Side - Progress: Progressing toward goal PT Goal: Sit to Stand - Progress: Progressing toward goal PT Goal: Stand to Sit - Progress: Progressing toward goal PT Goal: Ambulate - Progress: Progressing toward goal Additional Goals PT Goal: Additional Goal #1 - Progress: Not progressing  Visit Information  Last PT Received On: 07/05/11 Assistance Needed: +1    Subjective Data  Subjective: "I'm give out," pt  stated after amb.   Cognition  Overall Cognitive Status: Impaired Area of Impairment: Memory Arousal/Alertness: Awake/alert Behavior During Session: WFL for tasks performed Memory: Decreased recall of precautions    Balance  Static Sitting Balance Static Sitting - Balance Support: Bilateral upper extremity supported (on walker) Static Sitting - Level of Assistance: 5: Stand by assistance  End of Session PT - End of Session Activity Tolerance: Patient tolerated treatment well Patient left: in bed;with call bell/phone within reach;with bed alarm set;with family/visitor present    [facility] 07/05/2011, 1:57 PM  [facility] PT [phone]

## 2011-07-05 NOTE — Progress Notes (Addendum)
[address]            [place] [address]          [phone]     4 Days Post-Op  Procedure(s) (LRB): CORONARY ARTERY BYPASS GRAFTING (CABG) (N/A) ENDARTERECTOMY CAROTID (Right) Subjective: Feeling a little stronger overall  Objective  Telemetry SR, episode of STachy  Temp:  [97.2 F (36.2 C)-97.8 F (36.6 C)] 97.8 F (36.6 C) (05/11 0653) Pulse Rate:  [72-87] 72  (05/11 0653) Resp:  [18-20] 18  (05/11 0653) BP: (139-160)/(80-84) 154/83 mmHg (05/11 0653) SpO2:  [92 %-94 %] 92 % (05/11 0803) Weight:  [154 lb 4.8 oz (69.99 kg)] 154 lb 4.8 oz (69.99 kg) (05/11 4696)   Intake/Output Summary (Last 24 hours) at 07/05/11 0855 Last data filed at 07/05/11 0658  Gross per 24 hour  Intake      0 ml  Output   1400 ml  Net  -1400 ml       General appearance: alert, cooperative and no distress Heart: regular rate and rhythm and S1, S2 normal Lungs: diffuse wheeze but less prominant than yesterday Abdomen: soft, nontender Extremities: min edema Wound: incisions healing well  Lab Results:  Basename 07/04/11 0951 07/03/11 0500 07/02/11 1545  NA 129* 135 --  K 4.6 3.9 --  CL 94* 102 --  CO2 25 25 --  GLUCOSE 113* 98 --  BUN 15 12 --  CREATININE 0.64 0.53 --  CALCIUM 8.6 8.3* --  MG -- -- 1.9  PHOS -- -- --   No results found for this basename: AST:2,ALT:2,ALKPHOS:2,BILITOT:2,PROT:2,ALBUMIN:2 in the last 72 hours No results found for this basename: LIPASE:2,AMYLASE:2 in the last 72 hours  Basename 07/04/11 0951 07/03/11 0500  WBC 10.9* 10.5  NEUTROABS -- --  HGB 9.0* 8.8*  HCT 26.6* 26.0*  MCV 86.4 85.8  PLT 210 165   No results found for this basename: CKTOTAL:4,CKMB:4,TROPONINI:4 in the last 72 hours No components found with this basename: POCBNP:3 No results found for this basename: DDIMER in the last 72 hours No results found for this basename: HGBA1C in the last 72 hours No results found for this basename:  CHOL,HDL,LDLCALC,TRIG,CHOLHDL in the last 72 hours No results found for this basename: TSH,T4TOTAL,FREET3,T3FREE,THYROIDAB in the last 72 hours No results found for this basename: VITAMINB12,FOLATE,FERRITIN,TIBC,IRON,RETICCTPCT in the last 72 hours  Medications: Scheduled    . acetaminophen  1,000 mg Oral Q6H   Or  . acetaminophen (TYLENOL) oral liquid 160 mg/5 mL  975 mg Per Tube Q6H  . albuterol  2 puff Inhalation QID  . aspirin EC  325 mg Oral Daily   Or  . aspirin  324 mg Per Tube Daily  . bisacodyl  10 mg Oral Daily   Or  . bisacodyl  10 mg Rectal Daily  . budesonide-formoterol  2 puff Inhalation BID  . clopidogrel  75 mg Oral Q breakfast  . docusate sodium  200 mg Oral Daily  . enoxaparin  40 mg Subcutaneous Q24H  . FLUoxetine  10 mg Oral Daily  . furosemide  40 mg Oral BID  . glipiZIDE  10 mg Oral QAC breakfast  . insulin aspart  0-24 Units Subcutaneous TID AC & HS  . insulin glargine  20 Units Subcutaneous QPC breakfast  . iron polysaccharides  150 mg Oral Daily  . metoprolol tartrate  12.5 mg Oral BID  Or  . metoprolol tartrate  12.5 mg Per Tube BID  . mupirocin ointment   Nasal BID  . pantoprazole  40 mg Oral Q1200  . potassium chloride  40 mEq Oral BID  . simvastatin  40 mg Oral q1800  . sodium chloride  3 mL Intravenous Q12H  . tiotropium  18 mcg Inhalation Daily  . DISCONTD: furosemide  40 mg Oral Daily  . DISCONTD: potassium chloride  20 mEq Oral BID     Radiology/Studies:  Dg Chest 2 View  07/04/2011  *RADIOLOGY REPORT*  Clinical Data: Post CABG.  CHEST - 2 VIEW  Comparison: 07/03/2011  Findings: The left jugular central venous catheter has been removed.  There are bibasilar densities suggestive for small bilateral pleural effusions and compressive atelectasis. Epicardial pacer wires are present. Median sternotomy wires are intact.  No evidence for a pneumothorax. No significant edema.  IMPRESSION: Small bilateral pleural effusions. Right basilar densities  most likely represent atelectasis.  Original Report Authenticated By: Richarda Overlie, [name]    INR: Will add last result for INR, ABG once components are confirmed Will add last 4 CBG results once components are confirmed  Assessment/Plan: S/P Procedure(s) (LRB): CORONARY ARTERY BYPASS GRAFTING (CABG) (N/A) ENDARTERECTOMY CAROTID (Right)  1. Cont pulm toilet, RX , wean O2 2 hypertension, restart home vasotec 3 sugars well controlled on current rx 91-139 range 4 h/h stable 5 cont diuresis but decrease to daily lasix, recheck sodium   LOS: 4 days    GOLD,WAYNE E 5/11/20138:55 AM     Chart reviewed, patient examined, agree with above.

## 2011-07-06 LAB — GLUCOSE, CAPILLARY
Glucose-Capillary: 125 mg/dL — ABNORMAL HIGH (ref 70–99)
Glucose-Capillary: 122 mg/dL — ABNORMAL HIGH (ref 70–99)
Glucose-Capillary: 137 mg/dL — ABNORMAL HIGH (ref 70–99)

## 2011-07-06 LAB — BASIC METABOLIC PANEL
CO2: 23 mEq/L (ref 19–32)
Chloride: 100 mEq/L (ref 96–112)
Sodium: 136 mEq/L (ref 135–145)

## 2011-07-06 LAB — PRO B NATRIURETIC PEPTIDE: Pro B Natriuretic peptide (BNP): 3468 pg/mL — ABNORMAL HIGH (ref 0–125)

## 2011-07-06 MED ORDER — POTASSIUM CHLORIDE CRYS ER 20 MEQ PO TBCR: 40.0000 meq | EXTENDED_RELEASE_TABLET | Freq: Every day | ORAL | Status: DC

## 2011-07-06 MED ORDER — FUROSEMIDE 40 MG PO TABS: 40.0000 mg | ORAL_TABLET | Freq: Every day | ORAL | Status: DC

## 2011-07-06 NOTE — Progress Notes (Addendum)
[address]            [place] [address]          [phone]     5 Days Post-Op  Procedure(s) (LRB): CORONARY ARTERY BYPASS GRAFTING (CABG) (N/A) ENDARTERECTOMY CAROTID (Right) Subjective: Feeling well/improved , does not want to go to [facility]  Objective  Telemetry sinus/ occ pvc's  Temp:  [97.7 F (36.5 C)-98.7 F (37.1 C)] 98.6 F (37 C) (05/12 0557) Pulse Rate:  [69-81] 74  (05/12 0557) Resp:  [18-19] 19  (05/12 0557) BP: (100-137)/(80-87) 137/80 mmHg (05/12 0557) SpO2:  [90 %-95 %] 92 % (05/12 0835) Weight:  [153 lb 14.1 oz (69.8 kg)] 153 lb 14.1 oz (69.8 kg) (05/12 0557)   Intake/Output Summary (Last 24 hours) at 07/06/11 0912 Last data filed at 07/06/11 0600  Gross per 24 hour  Intake    600 ml  Output   2950 ml  Net  -2350 ml       General appearance: alert, cooperative and no distress Heart: regular rate and rhythm and S1, S2 normal Lungs: faint upper airway wheeze Abdomen: soft, nontender Extremities: minor edema Wound: incisions healing well  Lab Results:  Basename 07/06/11 0620 07/04/11 0951  NA 136 129*  K 4.4 4.6  CL 100 94*  CO2 23 25  GLUCOSE 129* 113*  BUN 13 15  CREATININE 0.58 0.64  CALCIUM 8.8 8.6  MG -- --  PHOS -- --   No results found for this basename: AST:2,ALT:2,ALKPHOS:2,BILITOT:2,PROT:2,ALBUMIN:2 in the last 72 hours No results found for this basename: LIPASE:2,AMYLASE:2 in the last 72 hours  Basename 07/04/11 0951  WBC 10.9*  NEUTROABS --  HGB 9.0*  HCT 26.6*  MCV 86.4  PLT 210   No results found for this basename: CKTOTAL:4,CKMB:4,TROPONINI:4 in the last 72 hours No components found with this basename: POCBNP:3 No results found for this basename: DDIMER in the last 72 hours No results found for this basename: HGBA1C in the last 72 hours No results found for this basename: CHOL,HDL,LDLCALC,TRIG,CHOLHDL in the last 72 hours No results found for this basename:  TSH,T4TOTAL,FREET3,T3FREE,THYROIDAB in the last 72 hours No results found for this basename: VITAMINB12,FOLATE,FERRITIN,TIBC,IRON,RETICCTPCT in the last 72 hours  Medications: Scheduled    . acetaminophen  1,000 mg Oral Q6H   Or  . acetaminophen (TYLENOL) oral liquid 160 mg/5 mL  975 mg Per Tube Q6H  . albuterol  2 puff Inhalation QID  . aspirin EC  325 mg Oral Daily   Or  . aspirin  324 mg Per Tube Daily  . bisacodyl  10 mg Oral Daily   Or  . bisacodyl  10 mg Rectal Daily  . budesonide-formoterol  2 puff Inhalation BID  . clopidogrel  75 mg Oral Q breakfast  . docusate sodium  200 mg Oral Daily  . enalapril  10 mg Oral Daily  . enoxaparin  40 mg Subcutaneous Q24H  . FLUoxetine  10 mg Oral Daily  . furosemide  40 mg Oral Daily  . glipiZIDE  10 mg Oral QAC breakfast  . insulin aspart  0-24 Units Subcutaneous TID AC & HS  . insulin glargine  20 Units Subcutaneous QPC breakfast  . iron polysaccharides  150 mg Oral Daily  . metoprolol tartrate  12.5 mg Oral BID   Or  . metoprolol tartrate  12.5 mg Per Tube BID  .  mupirocin ointment   Nasal BID  . pantoprazole  40 mg Oral Q1200  . potassium chloride  40 mEq Oral Daily  . simvastatin  40 mg Oral q1800  . sodium chloride  3 mL Intravenous Q12H  . tiotropium  18 mcg Inhalation Daily     Radiology/Studies:  No results found.  INR: Will add last result for INR, ABG once components are confirmed Will add last 4 CBG results once components are confirmed  Assessment/Plan: S/P Procedure(s) (LRB): CORONARY ARTERY BYPASS GRAFTING (CABG) (N/A) ENDARTERECTOMY CAROTID (Right)  1 doing well. Cont pulm rx /wean O2 2 may be able to tx to snf soon, but he does not want to go to [place] facility so will need additional bed search   LOS: 5 days    Daniel Tyler,Daniel Tyler 5/12/20139:12 AM     Chart reviewed, patient examined, agree with above. He could go to SNF tomorrow but doesn't want to go to [facility] when he has a bed.

## 2011-07-07 NOTE — Progress Notes (Addendum)
VASCULAR AND VEIN SURGERY POST - OP CEA PROGRESS NOTE  Date of Surgery: 07/01/2011 Surgeon: Surgeon(s): Loreli Slot, MD Sherren Kerns, MD 6 Days Post-Op right Carotid Endarterectomy .  HPI: OSHEN WLODARCZYK is a 71 y.o. male who is 6 Days Post-Op right Carotid Endarterectomy . Patient is doing well. Pre-operative symptoms are Improved Patient denies headache; Patient denies difficulty swallowing; denies weakness in upper or lower extremities; Pt. denies other symptoms of stroke or TIA.  IMAGING: No results found.  Significant Diagnostic Studies: CBC Lab Results  Component Value Date   WBC 10.9* 07/04/2011   HGB 9.0* 07/04/2011   HCT 26.6* 07/04/2011   MCV 86.4 07/04/2011   PLT 210 07/04/2011    BMET    Component Value Date/Time   NA 136 07/06/2011 0620   K 4.4 07/06/2011 0620   CL 100 07/06/2011 0620   CO2 23 07/06/2011 0620   GLUCOSE 129* 07/06/2011 0620   BUN 13 07/06/2011 0620   CREATININE 0.58 07/06/2011 0620   CREATININE 0.66 06/07/2011 1645   CALCIUM 8.8 07/06/2011 0620   GFRNONAA >90 07/06/2011 0620   GFRAA >90 07/06/2011 0620    COAG Lab Results  Component Value Date   INR 1.42 07/01/2011   INR 1.00 06/27/2011   INR 1.05 04/24/2011   No results found for this basename: PTT      Intake/Output Summary (Last 24 hours) at 07/07/11 0728 Last data filed at 07/07/11 0600  Gross per 24 hour  Intake    720 ml  Output   2501 ml  Net  -1781 ml    Physical Exam:  BP Readings from Last 3 Encounters:  07/07/11 153/89  07/07/11 153/89  06/27/11 138/85   Temp Readings from Last 3 Encounters:  07/07/11 97.3 F (36.3 C) Oral  07/07/11 97.3 F (36.3 C) Oral  06/27/11 97.1 F (36.2 C) Oral   SpO2 Readings from Last 3 Encounters:  07/07/11 93%  07/07/11 93%  06/27/11 95%   Pulse Readings from Last 3 Encounters:  07/07/11 72  07/07/11 72  06/27/11 66    Pt is A&O x 3 Speech is normal right Neck Wound is healing well Patient with Negative tongue  deviation and Negative facial droop Pt has good and equal strength in all extremities  Assessment: Tajee Savant Repsher is a 71 y.o. male is S/P Right Carotid endarterectomy Pt is voiding, ambulating and taking po well   Plan: Discharge to: Rehab here in the hospital Follow-up in 2 weeks   Clinton Gallant [facility] [phone] 07/07/2011 7:28 AM   Agree  Fabienne Bruns, MD [facility] Office: [phone] Pager: [phone]

## 2011-07-07 NOTE — Progress Notes (Signed)
Physical Therapy Treatment Patient Details Name: Daniel Tyler MRN: [id number] DOB: November 20, 1940 Today's Date: 07/07/2011 Time: 4098-1191 PT Time Calculation (min): 33 min  PT Assessment / Plan / Recommendation Comments on Treatment Session  Pt s/p CABG and Right CEA. Pt educated for precautions, transfers, IS use and difference between CIR and SNF and pt now agreeable to [facility]. SW made aware of SNF recommendation and pt now agreeable.     Follow Up Recommendations       Barriers to Discharge        Equipment Recommendations       Recommendations for Other Services    Frequency     Plan Discharge plan remains appropriate;Frequency remains appropriate    Precautions / Restrictions Precautions Precautions: Sternal;Fall   Pertinent Vitals/Pain No pain O2 sats 95 and HR 81 at rest on 1L prior to tx With amb on RA sats down to 83 but maintained 95% on 2L with ambulation HR up to 101 with gait and back to 85 end of session    Mobility  Bed Mobility Bed Mobility: Rolling Right;Right Sidelying to Sit Rolling Right: 4: Min guard Right Sidelying to Sit: 4: Min assist;HOB flat Details for Bed Mobility Assistance: cueing for sequence to prevent pulling on rail and to maintain precautions throughout transfer Transfers Transfers: Sit to Stand;Stand to Sit Sit to Stand: From bed;4: Min assist Stand to Sit: To chair/3-in-1;4: Min guard Details for Transfer Assistance: cueing for hand placement with need for hand over hand cueing before pt would properly place hands to stand adhering to precautions and pt used armrests to sit despite cueing Ambulation/Gait Ambulation/Gait Assistance: 4: Min guard Ambulation Distance (Feet): 350 Feet Assistive device: Rolling walker Ambulation/Gait Assistance Details: cueing to step into RW and for breathing technique throughout Gait Pattern: Decreased stride length;Trunk flexed Gait velocity: 50ft/39sec= .77 placing pt at high fall risk Stairs: No      Exercises General Exercises - Lower Extremity Long Arc Quad: AROM;Both;20 reps;Seated Hip Flexion/Marching: AROM;Both;20 reps;Seated   PT Diagnosis:    PT Problem List:   PT Treatment Interventions:     PT Goals Acute Rehab PT Goals PT Goal: Supine/Side to Sit - Progress: Progressing toward goal PT Goal: Sit to Supine/Side - Progress: Progressing toward goal PT Goal: Sit to Stand - Progress: Progressing toward goal PT Goal: Stand to Sit - Progress: Progressing toward goal PT Goal: Ambulate - Progress: Progressing toward goal PT Goal: Up/Down Stairs - Progress: Progressing toward goal Additional Goals PT Goal: Additional Goal #1 - Progress: Progressing toward goal  Visit Information  Last PT Received On: 07/07/11 Assistance Needed: +1    Subjective Data  Subjective: I'm doing better   Cognition  Overall Cognitive Status: Impaired Area of Impairment: Memory;Following commands Arousal/Alertness: Awake/alert Orientation Level: Appears intact for tasks assessed Behavior During Session: [facility] for tasks performed Current Attention Level: Selective Memory: Decreased recall of precautions Following Commands: Follows one step commands inconsistently    Balance     End of Session PT - End of Session Activity Tolerance: Patient tolerated treatment well Patient left: in chair;with call bell/phone within reach Nurse Communication: Mobility status    Delorse Lek 07/07/2011, 10:56 AM Delaney Meigs, PT [phone]

## 2011-07-07 NOTE — Discharge Summary (Signed)
Physician Discharge Summary  Patient ID: Daniel Tyler MRN: [id number] DOB/AGE: June 23, 1940 71 y.o.  Admit date: 07/01/2011 Discharge date: 07/07/2011  Admission Diagnoses:  Patient Active Problem List  Diagnoses  . CAD (coronary artery disease)  . Insulin dependent type 2 diabetes mellitus, controlled  . Dyslipidemia  . Depression  . Carotid artery disease  . Hypertension  . COPD (chronic obstructive pulmonary disease)  . Anemia due to blood loss, acute   Discharge Diagnoses:   Patient Active Problem List  Diagnoses  . CAD (coronary artery disease)  . Insulin dependent type 2 diabetes mellitus, controlled  . Dyslipidemia  . Depression  . S/P CABG (coronary artery bypass graft)  . Carotid artery disease  . Hypertension  . COPD (chronic obstructive pulmonary disease)  . Anemia due to blood loss, acute   Discharged Condition: good  History of Present Illness:   Daniel Tyler is a 71 yo white male who was hospitalized in late February after suffering a syncopal episode.  The patient has little recolection of the event.  During the hospitalization the patient was diagnosed with pneumonia and found to be septic.  During acute phase of his infection patient suffered a Non-Q wave myocardial infarction.  The patient was initially observed and once he became a bit more medically stable he was taken for cardiac catheterization which confirmed the presence of severe 3 vessel CAD and impaired LV Function.  He also underwent ECHO which revealed some mild to moderate mitral regurgitation.  The patient was discharged from the hospital with a follow up appointment with [facility] for possible bypass surgery.  The patient presented to Dr. Sunday Corn office on 06/05/2011 for bypass evaluation.  At that time the patient denied any episodes of chest pain or dyspnea since discharge from hospital.  The patient risks and benefits of the procedure were explained to the patient and he was felt to be a candidate  for bypass surgery.  However, the patient needed to undergo pulmonary function testing and carotid duplex.  He again presented to the Dr. Rondel Jumbo office on 06/25/2011 after completion of his preoperative testing.  His carotid duplex study revealed severe stenosis of his right ICA.  He was evaluated by Dr. Darrick Penna prior to his appointment with Dr. Dorris Fetch who felt he should undergo endarterectomy at the same time as his bypass procedure.  The patient was agreeable to proceed with surgery and he was set up for an outpatient bypass surgery and right carotid endarterectomy tentatively scheduled for 07/01/2011.  Hospital Course:   Daniel Tyler presented to [facility] on 07[facility]01/2011 and underwent right sided Carotid Endarterectomy, CABG x 5 utilizing LIMA to LAD, SVG to PDA with endarterectomy, SVG to Diagonal, and SVG to OM1 and OM2, and Endoscopic Saphenous vein harvest of Right Leg.  The patient tolerated the procedure well and was taken to the surgical ICU in stable condition.  POD #0 the patient was extubated, however patient significantly desaturated after extubation and required emergent re-intubation.  POD #1 the patient was extubated.  POD #2 the patients chest tube and drains were removed wtihout difficulty.  He was medically stable and transferred to the step down unit in stable condition.  POD #3 patients chest xray revealed no evidence of pneumothorax.  There was some pleural fluid and atelectasis bilaterally. POD #4 the patient was hypertensive.  His home Norvasc was restarted.  POD #6 the patient is doing well.  He continues to require some oxygen via nasal cannula.  He  has been working with PT/OT since surgery and it was felt the patient would benefit from inpatient rehab.  He is medically stable at this time and will be discharged to [facility] today.  He is to follow up with Dr. Darrick Penna, whose office will contact patient with appointment.  He is to follow up with Dr. Dorris Fetch on  07/29/2011 at 10:45 am.  He will need to have a chest xray performed at [facility] one hour prior to his appointment with Dr. Dorris Fetch.       Disposition: 03-Skilled Nursing Facility  Discharge Orders    Future Appointments: Provider: Department: Dept Phone: Center:   07/17/2011 12:30 PM Sherren Kerns, MD [facility] [phone] [facility]   07/29/2011 10:45 AM Loreli Slot, MD [facility] [phone] [facility]     Medication List  As of 07/07/2011 11:23 AM   TAKE these medications         acetaminophen 500 MG tablet   Commonly known as: TYLENOL   Take 500 mg by mouth every 6 (six) hours as needed. For mild pain      Acidophilus 10 MG Caps   Take 1 capsule by mouth daily.      albuterol 108 (90 BASE) MCG/ACT inhaler   Commonly known as: PROVENTIL HFA;VENTOLIN HFA   Inhale 2 puffs into the lungs every 6 (six) hours as needed. For wheezing or shortness of breath      ALPRAZolam 0.25 MG tablet   Commonly known as: XANAX   Take 1 tablet (0.25 mg total) by mouth at bedtime as needed for sleep.      budesonide-formoterol 160-4.5 MCG/ACT inhaler   Commonly known as: SYMBICORT   Inhale 2 puffs into the lungs 2 (two) times daily.      clopidogrel 75 MG tablet   Commonly known as: PLAVIX   Take 1 tablet (75 mg total) by mouth daily with breakfast.      enalapril 10 MG tablet   Commonly known as: VASOTEC   Take 1 tablet (10 mg total) by mouth daily.      FLUoxetine 10 MG capsule   Commonly known as: PROZAC   Take 10 mg by mouth daily.      furosemide 20 MG tablet   Commonly known as: LASIX   Take 1 tablet (20 mg total) by mouth daily.      furosemide 40 MG tablet   Commonly known as: LASIX   Take 1 tablet (40 mg total) by mouth daily.      glipiZIDE 10 MG tablet   Commonly known as: GLUCOTROL   Take 1 tablet (10 mg total) by mouth 2 (two) times daily before a meal.      guaiFENesin 600 MG 12 hr tablet   Commonly known as: MUCINEX   Take 1,200 mg by mouth 2  (two) times daily.      insulin glargine 100 UNIT/ML injection   Commonly known as: LANTUS   Inject 17 Units into the skin at bedtime.      loperamide 2 MG capsule   Commonly known as: IMODIUM   Take 2 mg by mouth 4 (four) times daily as needed. For diarrhea or loose stools      metFORMIN 1000 MG tablet   Commonly known as: GLUCOPHAGE   Take 1 tablet (1,000 mg total) by mouth 2 (two) times daily with a meal.      metoprolol tartrate 12.5 mg Tabs   Commonly known as: LOPRESSOR   Take  0.5 tablets (12.5 mg total) by mouth 2 (two) times daily.      Muscle Rub 10-15 % Crea   Apply 1 application topically as needed. For muscle pain      oxyCODONE 5 MG immediate release tablet   Commonly known as: Oxy IR/ROXICODONE   Take 1-2 tablets (5-10 mg total) by mouth every 4 (four) hours as needed.      polysaccharide iron 150 MG capsule   Generic drug: iron polysaccharides   Take 150 mg by mouth daily.      potassium chloride SA 20 MEQ tablet   Commonly known as: K-DUR,KLOR-CON   Take 2 tablets (40 mEq total) by mouth daily.      potassium chloride SA 20 MEQ tablet   Commonly known as: K-DUR,KLOR-CON   Take 2 tablets (40 mEq total) by mouth daily.      promethazine 25 MG tablet   Commonly known as: PHENERGAN   Take 25 mg by mouth every 6 (six) hours as needed. For nausea/vomiting      simvastatin 40 MG tablet   Commonly known as: ZOCOR   Take 1 tablet (40 mg total) by mouth daily at 6 PM.      tiotropium 18 MCG inhalation capsule   Commonly known as: SPIRIVA   Place 1 capsule (18 mcg total) into inhaler and inhale daily.      traMADol 50 MG tablet   Commonly known as: ULTRAM   Take 1-2 tablets (50-100 mg total) by mouth every 4 (four) hours as needed.           Follow-up Information    Schedule an appointment as soon as possible for a visit with Darden Palmer, MD.   Contact information:   [address] [place]  [address] [phone]       Follow up with Sherren Kerns, MD in 2 weeks. (sent to office)    Contact information:   [address] [place] [address] [phone]       Follow up with Loreli Slot, MD on 07/29/2011. (Appointment is at 1045)    Contact information:   [address] [place] [address] [phone]       Follow up with [facility] on 07/29/2011. (Please get chest xray performed at 9:45 prior to your appointment with Dr. Dorris Fetch)    Contact information:   [address]         Signed: [name] 07/07/2011, 11:23 AM

## 2011-07-07 NOTE — Progress Notes (Signed)
Patient is agreeable to SNF bed at [facility] given CIR isn ot available.  Lanier Prude, PA-C, of this- anticipate d/c later today if medically stable. Reece Levy, MSW, Theresia Majors [phone]

## 2011-07-07 NOTE — Clinical Social Work Placement (Signed)
     Clinical Social Work Department CLINICAL SOCIAL WORK PLACEMENT NOTE 07/07/2011  Patient:  Daniel Tyler, Daniel Tyler  Account Number:  [id number] Admit date:  07/01/2011  Clinical Social Worker:  Baxter Flattery, LCSWA  Date/time:  07/02/2011 04:00 PM  Clinical Social Work is seeking post-discharge placement for this patient at the following level of care:   SKILLED NURSING   (*CSW will update this form in Epic as items are completed)     Patient/family provided with [facility] Department of Clinical Social Works list of facilities offering this level of care within the geographic area requested by the patient (or if unable, by the patients family).    Patient/family informed of their freedom to choose among providers that offer the needed level of care, that participate in Medicare, Medicaid or managed care program needed by the patient, have an available bed and are willing to accept the patient.    Patient/family informed of [facility] ownership interest in [facility], as well as of the fact that they are under no obligation to receive care at this facility.  PASARR submitted to EDS on  PASARR number received from EDS on   FL2 transmitted to all facilities in geographic area requested by pt/family on   FL2 transmitted to all facilities within larger geographic area on   Patient informed that his/her managed care company has contracts with or will negotiate with  certain facilities, including the following:     Patient/family informed of bed offers received:  07/04/2011 Patient chooses bed at [facility] [place] Physician recommends and patient chooses bed at    Patient to [facility] [place] on  07/07/2011 Patient to be transferred to facility by [facility]  The following physician request were entered in Epic:   Additional Comments: Pt recently at [facility] for short term rehab and requesting placement at same

## 2011-07-07 NOTE — Progress Notes (Addendum)
6 Days Post-Op Procedure(s) (LRB): CORONARY ARTERY BYPASS GRAFTING (CABG) (N/A) ENDARTERECTOMY CAROTID (Right) Subjective: [name] has no complaints this morning.  He still expresses wishes to be placed into rehab upstairs, but does not want to go to [facility].  Objective: Vital signs in last 24 hours: Temp:  [97.3 F (36.3 C)-98 F (36.7 C)] 97.3 F (36.3 C) (05/13 0429) Pulse Rate:  [72-102] 72  (05/13 0429) Cardiac Rhythm:  [-] Normal sinus rhythm (05/12 2000) Resp:  [18-20] 19  (05/13 0429) BP: (153-189)/(89-90) 153/89 mmHg (05/13 0429) SpO2:  [91 %-93 %] 93 % (05/13 0429) Weight:  [146 lb 6.2 oz (66.4 kg)] 146 lb 6.2 oz (66.4 kg) (05/13 0622)  Intake/Output from previous day: 05/12 0701 - 05/13 0700 In: 720 [P.O.:720] Out: 2501 [Urine:2500; Stool:1]    General appearance: alert, cooperative and no distress Neurologic: intact Heart: regular rate and rhythm Lungs: clear to auscultation bilaterally Abdomen: soft, non-tender; bowel sounds normal; no masses,  no organomegaly Extremities: extremities normal, atraumatic, no cyanosis or edema Wound: clean and dry  Lab Results:  [facility] 07/04/11 0951  WBC 10.9*  HGB 9.0*  HCT 26.6*  PLT 210   BMET:  Basename 07/06/11 0620 07/04/11 0951  NA 136 129*  K 4.4 4.6  CL 100 94*  CO2 23 25  GLUCOSE 129* 113*  BUN 13 15  CREATININE 0.58 0.64  CALCIUM 8.8 8.6    PT/INR: No results found for this basename: LABPROT,INR in the last 72 hours ABG    Component Value Date/Time   PHART 7.429 07/02/2011 0900   HCO3 22.0 07/02/2011 0900   TCO2 20 07/02/2011 1544   ACIDBASEDEF 2.0 07/02/2011 0900   O2SAT 96.0 07/02/2011 0900   CBG (last 3)   Basename 07/07/11 0617 07/06/11 2127 07/06/11 1629  GLUCAP 139* 126* 161*    Assessment/Plan: S/P Procedure(s) (LRB): CORONARY ARTERY BYPASS GRAFTING (CABG) (N/A) ENDARTERECTOMY CAROTID (Right)  2. CV- NSR, rate and pressure controlled, continue Lopressor 3. Resp- wean oxygen as  tolerated 4. Deconditioning-continue PT/OT, needs rehab 5. Dispo- patient ready for d/c however, does not want to go to [facility], will have social work evaluate other possibilities      LOS: 6 days    [name] 07/07/2011   PAtient seen and examined. Agree with above. For d/c to SNF today

## 2011-07-07 NOTE — Progress Notes (Signed)
EPW / ct sutures discontinued per protocol. Tips intact. Patient tolerated well. Last INR INR/Prothrombin Time on   .  Patient advised Bedrest X 1 hour. Elease Hashimoto

## 2011-07-07 NOTE — Progress Notes (Signed)
1320-1400 Completed dischagre education with pt. He agrees to [employer]. CRP in [place], will send referral.

## 2011-07-08 ENCOUNTER — Encounter: Payer: Self-pay | Admitting: Vascular Surgery

## 2011-07-08 ENCOUNTER — Other Ambulatory Visit: Payer: Self-pay

## 2011-07-14 ENCOUNTER — Encounter (HOSPITAL_COMMUNITY): Payer: Self-pay

## 2011-07-14 ENCOUNTER — Observation Stay (HOSPITAL_COMMUNITY)
Admission: EM | Admit: 2011-07-14 | Discharge: 2011-07-14 | Disposition: A | Discharge status: Skilled Nursing Facility | Payer: Medicare Other | Source: Ambulatory Visit | Attending: Emergency Medicine | Admitting: Emergency Medicine

## 2011-07-14 ENCOUNTER — Emergency Department (HOSPITAL_COMMUNITY): Payer: Medicare Other

## 2011-07-14 DIAGNOSIS — E119 Type 2 diabetes mellitus without complications: Secondary | ICD-10-CM | POA: Insufficient documentation

## 2011-07-14 DIAGNOSIS — I10 Essential (primary) hypertension: Secondary | ICD-10-CM | POA: Insufficient documentation

## 2011-07-14 DIAGNOSIS — Y92009 Unspecified place in unspecified non-institutional (private) residence as the place of occurrence of the external cause: Secondary | ICD-10-CM | POA: Insufficient documentation

## 2011-07-14 DIAGNOSIS — Z951 Presence of aortocoronary bypass graft: Secondary | ICD-10-CM | POA: Insufficient documentation

## 2011-07-14 DIAGNOSIS — I252 Old myocardial infarction: Secondary | ICD-10-CM | POA: Insufficient documentation

## 2011-07-14 DIAGNOSIS — M542 Cervicalgia: Secondary | ICD-10-CM | POA: Insufficient documentation

## 2011-07-14 DIAGNOSIS — I2510 Atherosclerotic heart disease of native coronary artery without angina pectoris: Secondary | ICD-10-CM | POA: Insufficient documentation

## 2011-07-14 DIAGNOSIS — W19XXXA Unspecified fall, initial encounter: Secondary | ICD-10-CM

## 2011-07-14 DIAGNOSIS — R5381 Other malaise: Principal | ICD-10-CM | POA: Insufficient documentation

## 2011-07-14 DIAGNOSIS — R51 Headache: Secondary | ICD-10-CM | POA: Insufficient documentation

## 2011-07-14 DIAGNOSIS — E785 Hyperlipidemia, unspecified: Secondary | ICD-10-CM | POA: Insufficient documentation

## 2011-07-14 DIAGNOSIS — Z8673 Personal history of transient ischemic attack (TIA), and cerebral infarction without residual deficits: Secondary | ICD-10-CM | POA: Insufficient documentation

## 2011-07-14 DIAGNOSIS — R410 Disorientation, unspecified: Secondary | ICD-10-CM

## 2011-07-14 DIAGNOSIS — E162 Hypoglycemia, unspecified: Secondary | ICD-10-CM

## 2011-07-14 LAB — CBC
HCT: 30.3 % — ABNORMAL LOW (ref 39.0–52.0)
Hemoglobin: 10.4 g/dL — ABNORMAL LOW (ref 13.0–17.0)
MCH: 28.6 pg (ref 26.0–34.0)
MCHC: 34.3 g/dL (ref 30.0–36.0)
MCV: 83.2 fL (ref 78.0–100.0)
Platelets: 659 K/uL — ABNORMAL HIGH (ref 150–400)
RBC: 3.64 MIL/uL — ABNORMAL LOW (ref 4.22–5.81)
RDW: 14.9 % (ref 11.5–15.5)
WBC: 14.5 K/uL — ABNORMAL HIGH (ref 4.0–10.5)

## 2011-07-14 LAB — URINALYSIS, ROUTINE W REFLEX MICROSCOPIC
Bilirubin Urine: NEGATIVE
Glucose, UA: NEGATIVE mg/dL
Hgb urine dipstick: NEGATIVE
Ketones, ur: NEGATIVE mg/dL
Leukocytes, UA: NEGATIVE
Nitrite: NEGATIVE
Protein, ur: NEGATIVE mg/dL
Specific Gravity, Urine: 1.009 (ref 1.005–1.030)
Urobilinogen, UA: 1 mg/dL (ref 0.0–1.0)
pH: 6.5 (ref 5.0–8.0)

## 2011-07-14 LAB — BASIC METABOLIC PANEL WITH GFR
BUN: 18 mg/dL (ref 6–23)
CO2: 25 meq/L (ref 19–32)
Calcium: 9.4 mg/dL (ref 8.4–10.5)
Chloride: 88 meq/L — ABNORMAL LOW (ref 96–112)
Creatinine, Ser: 0.7 mg/dL (ref 0.50–1.35)
GFR calc Af Amer: >90 mL/min
GFR calc non Af Amer: >90 mL/min
Glucose, Bld: 57 mg/dL — ABNORMAL LOW (ref 70–99)
Potassium: 4.3 meq/L (ref 3.5–5.1)
Sodium: 125 meq/L — ABNORMAL LOW (ref 135–145)

## 2011-07-14 LAB — DIFFERENTIAL
Basophils Absolute: 0 10*3/uL (ref 0.0–0.1)
Basophils Relative: 0 % (ref 0–1)
Eosinophils Absolute: 0.1 10*3/uL (ref 0.0–0.7)
Eosinophils Relative: 1 % (ref 0–5)
Lymphocytes Relative: 12 % (ref 12–46)
Lymphs Abs: 1.7 K/uL (ref 0.7–4.0)
Monocytes Absolute: 1.4 10*3/uL — ABNORMAL HIGH (ref 0.1–1.0)
Monocytes Relative: 10 % (ref 3–12)
Neutro Abs: 11.2 K/uL — ABNORMAL HIGH (ref 1.7–7.7)
Neutrophils Relative %: 77 % (ref 43–77)

## 2011-07-14 LAB — GLUCOSE, CAPILLARY
Glucose-Capillary: 41 mg/dL — CL (ref 70–99)
Glucose-Capillary: 56 mg/dL — ABNORMAL LOW (ref 70–99)
Glucose-Capillary: 107 mg/dL — ABNORMAL HIGH (ref 70–99)
Glucose-Capillary: 79 mg/dL (ref 70–99)
Glucose-Capillary: 116 mg/dL — ABNORMAL HIGH (ref 70–99)

## 2011-07-14 LAB — TROPONIN I: Troponin I: <0.3 ng/mL (ref <0.30)

## 2011-07-14 MED ORDER — ONDANSETRON HCL 4 MG/2ML IJ SOLN: 4.0000 mg | Freq: Four times a day (QID) | INTRAMUSCULAR | Status: DC | PRN

## 2011-07-14 MED ORDER — DEXTROSE 50 % IV SOLN
12.5000 g | Freq: Once | INTRAVENOUS | Status: AC
  Administered 2011-07-14: 12.5 g via INTRAVENOUS
  Filled 2011-07-14: qty 50

## 2011-07-14 MED ORDER — SODIUM CHLORIDE 0.9 % IV BOLUS (SEPSIS)
500.0000 mL | Freq: Once | INTRAVENOUS | Status: AC
  Administered 2011-07-14: 500 mL via INTRAVENOUS

## 2011-07-14 MED ORDER — GLUCOSE 40 % PO GEL
ORAL | Status: AC
  Administered 2011-07-14: 37.5 g
  Filled 2011-07-14: qty 1

## 2011-07-14 MED ORDER — ACETAMINOPHEN 325 MG PO TABS: 650.0000 mg | ORAL_TABLET | ORAL | Status: DC | PRN

## 2011-07-14 NOTE — ED Notes (Signed)
Report called to [facility].  Patient report given to [place] , RN

## 2011-07-14 NOTE — ED Notes (Signed)
Carb Mod Diet Ordered

## 2011-07-14 NOTE — ED Provider Notes (Addendum)
History     CSN: [id number]  Arrival date & time 07/14/11  1310   First MD Initiated Contact with Patient 07/14/11 1311      Chief Complaint  Patient presents with  . Fall    (Consider location/radiation/quality/duration/timing/severity/associated sxs/prior treatment) HPI Comments: Pt apparently felt weak this AM, had a fall, unknown if complete LOC, was more disoriented than usual.  He has had a small MI, 5 vessel CABG, right side endarterectomy and pneumonia in the past month.  He is on multiple meds and pt attributes his symptoms to all of his meds.  Currently denies HA, has some posterior and right sided neck pain, some of which he thinks is due to his recent surgery, pt is in ccollar and on board upon arrival by EMS.  No CP, flank pain.  No sig lower back pain, but did land on posterior right side of body.  Denies hip pain, limb pain.  Denies current SOB, N/V/D.    Patient is a 71 y.o. male presenting with fall. The history is provided by the patient and the EMS personnel.  Fall Pertinent negatives include no abdominal pain and no headaches.    Past Medical History  Diagnosis Date  . Enlarged heart   . Hyperlipidemia   . Hypertension   . Myocardial infarction   . Carotid stenosis   . Coronary artery disease   . Pneumonia     hx of  . Diabetes mellitus     takes metformin & lantus  . Anemia     Past Surgical History  Procedure Date  . Cardiac catheterization 2013  . Coronary artery bypass graft 07/01/2011    Procedure: CORONARY ARTERY BYPASS GRAFTING (CABG);  Surgeon: Loreli Slot, MD;  Location: [facility] OR;  Service: Open Heart Surgery;  Laterality: N/A;  . Endarterectomy 07/01/2011    Procedure: ENDARTERECTOMY CAROTID;  Surgeon: Sherren Kerns, MD;  Location: [facility] OR;  Service: Vascular;  Laterality: Right;    Family History  Problem Relation Age of Onset  . Heart disease Father   . Cancer Brother   . Diabetes Brother   . Diabetes Daughter   . Anesthesia  problems Neg Hx   . Hypotension Neg Hx   . Malignant hyperthermia Neg Hx   . Pseudochol deficiency Neg Hx     History  Substance Use Topics  . Smoking status: Former Smoker -- 1.0 packs/day for 20 years    Types: Cigarettes    Quit date: 02/24/1978  . Smokeless tobacco: Never Used  . Alcohol Use: No      Review of Systems  HENT: Positive for neck pain.   Respiratory: Negative for shortness of breath.   Cardiovascular: Negative for chest pain.  Gastrointestinal: Negative for abdominal pain.  Musculoskeletal: Negative for back pain and arthralgias.  Skin: Negative for color change and wound.  Neurological: Negative for dizziness, syncope, light-headedness and headaches.  Psychiatric/Behavioral: Positive for confusion.  All other systems reviewed and are negative.    Allergies  Review of patient's allergies indicates no known allergies.  Home Medications   Current Outpatient Rx  Name Route Sig Dispense Refill  . ACETAMINOPHEN 500 MG PO TABS Oral Take 500 mg by mouth every 6 (six) hours as needed. For mild pain    . ALBUTEROL SULFATE HFA 108 (90 BASE) MCG/ACT IN AERS Inhalation Inhale 2 puffs into the lungs every 6 (six) hours as needed. For wheezing or shortness of breath    . BUDESONIDE-FORMOTEROL FUMARATE 160-4.5  MCG/ACT IN AERO Inhalation Inhale 2 puffs into the lungs 2 (two) times daily. 1 Inhaler 2  . CLOPIDOGREL BISULFATE 75 MG PO TABS Oral Take 1 tablet (75 mg total) by mouth daily with breakfast. 90 tablet 2  . ENALAPRIL MALEATE 10 MG PO TABS Oral Take 1 tablet (10 mg total) by mouth daily. 90 tablet 2  . FLUOXETINE HCL 10 MG PO CAPS Oral Take 10 mg by mouth daily.    . FUROSEMIDE 20 MG PO TABS Oral Take 1 tablet (20 mg total) by mouth daily. 30 tablet 11  . FUROSEMIDE 40 MG PO TABS Oral Take 1 tablet (40 mg total) by mouth daily. 7 tablet 0  . GLIPIZIDE 10 MG PO TABS Oral Take 1 tablet (10 mg total) by mouth 2 (two) times daily before a meal. 60 tablet 11  .  GUAIFENESIN ER 600 MG PO TB12 Oral Take 1,200 mg by mouth 2 (two) times daily.    . INSULIN GLARGINE 100 UNIT/ML Brooks SOLN Subcutaneous Inject 17 Units into the skin at bedtime.     . ACIDOPHILUS 10 MG PO CAPS Oral Take 1 capsule by mouth daily.    Marland Kitchen LOPERAMIDE HCL 2 MG PO CAPS Oral Take 2 mg by mouth 4 (four) times daily as needed. For diarrhea or loose stools    . MUSCLE RUB 10-15 % EX CREA Topical Apply 1 application topically as needed. For muscle pain    . METFORMIN HCL 1000 MG PO TABS Oral Take 1 tablet (1,000 mg total) by mouth 2 (two) times daily with a meal. 60 tablet 11  . METOPROLOL TARTRATE 12.5 MG HALF TABLET Oral Take 0.5 tablets (12.5 mg total) by mouth 2 (two) times daily. 30 tablet 1  . OXYCODONE HCL 5 MG PO TABS Oral Take 1-2 tablets (5-10 mg total) by mouth every 4 (four) hours as needed. 30 tablet 0  . POLYSACCHARIDE IRON 150 MG PO CAPS Oral Take 150 mg by mouth daily.    Marland Kitchen POTASSIUM CHLORIDE CRYS ER 20 MEQ PO TBCR Oral Take 2 tablets (40 mEq total) by mouth daily. 30 tablet 0  . POTASSIUM CHLORIDE CRYS ER 20 MEQ PO TBCR Oral Take 2 tablets (40 mEq total) by mouth daily. 7 tablet 0  . PROMETHAZINE HCL 25 MG PO TABS Oral Take 25 mg by mouth every 6 (six) hours as needed. For nausea/vomiting    . SIMVASTATIN 40 MG PO TABS Oral Take 1 tablet (40 mg total) by mouth daily at 6 PM. 90 tablet 2  . TIOTROPIUM BROMIDE MONOHYDRATE 18 MCG IN CAPS Inhalation Place 1 capsule (18 mcg total) into inhaler and inhale daily. 30 capsule   . TRAMADOL HCL 50 MG PO TABS Oral Take 1-2 tablets (50-100 mg total) by mouth every 4 (four) hours as needed. 30 tablet 0    BP 113/71  Pulse 79  Temp(Src) 98.1 F (36.7 C) (Oral)  Resp 16  SpO2 94%  Physical Exam  Nursing note and vitals reviewed. Constitutional: He is oriented to person, place, and time. He appears well-developed and well-nourished.  HENT:  Head: Normocephalic and atraumatic.  Eyes: Pupils are equal, round, and reactive to light.    Neck: Spinous process tenderness present. No edema and no erythema present.    Cardiovascular: Normal rate and intact distal pulses.   No murmur heard. Pulmonary/Chest: Effort normal. No respiratory distress. He has no wheezes. He has no rales.  Abdominal: Soft. He exhibits no distension. There is no tenderness.  There is no guarding.  Musculoskeletal: He exhibits no tenderness.  Neurological: He is alert and oriented to person, place, and time.  Skin: Skin is warm. No rash noted.    ED Course  Procedures (including critical care time)  Labs Reviewed  CBC - Abnormal; Notable for the following:    WBC 14.5 (*)    RBC 3.64 (*)    Hemoglobin 10.4 (*)    HCT 30.3 (*)    Platelets 659 (*)    All other components within normal limits  DIFFERENTIAL - Abnormal; Notable for the following:    Neutro Abs 11.2 (*)    Monocytes Absolute 1.4 (*)    All other components within normal limits  BASIC METABOLIC PANEL - Abnormal; Notable for the following:    Sodium 125 (*)    Chloride 88 (*)    Glucose, Bld 57 (*)    All other components within normal limits  GLUCOSE, CAPILLARY - Abnormal; Notable for the following:    Glucose-Capillary 41 (*)    All other components within normal limits  TROPONIN I  URINALYSIS, ROUTINE W REFLEX MICROSCOPIC   Dg Chest 2 View  07/14/2011  *RADIOLOGY REPORT*  Clinical Data: Fall  CHEST - 2 VIEW  Comparison: 07/04/2011 .  Findings: Small bilateral pleural effusions.  Bibasilar atelectasis.  Ill-defined 1 cm nodular density now projects over the right fifth rib posteriorly.  No pneumothorax.  No obvious acute bony deformity.  IMPRESSION: New right upper lobe nodular density.  Upright PA and lateral is recommended when feasible to further characterize.  Small bilateral pleural effusions.  Bibasilar atelectasis.  Original Report Authenticated By: Donavan Burnet, [name]   Dg Lumbar Spine Complete  07/14/2011  *RADIOLOGY REPORT*  Clinical Data: Fall, back pain  LUMBAR  SPINE - COMPLETE 4+ VIEW  Comparison: None.  Findings: Five views of the lumbar spine submitted.  There is disc space flattening with endplate sclerotic changes at L2-L3 level. No acute fracture or subluxation.  Atherosclerotic calcifications of the abdominal aorta and the iliac arteries are noted.  Mild anterior spurring noted at L2-L3 level.  IMPRESSION: No acute fracture or subluxation.  Degenerative changes at L2 L3 level.  Original Report Authenticated By: Natasha Mead, [name]   Ct Head Wo Contrast  07/14/2011  *RADIOLOGY REPORT*  Clinical Data:  The patient fell today.  Weakness.  Head, neck pain.  Patient motion.  CT HEAD WITHOUT CONTRAST CT CERVICAL SPINE WITHOUT CONTRAST  Technique:  Multidetector CT imaging of the head and cervical spine was performed following the standard protocol without intravenous contrast.  Multiplanar CT image reconstructions of the cervical spine were also generated.  Comparison:  None  CT HEAD  Findings: There is an old lacunar infarct involving the left basal ganglia. There is no evidence for hemorrhage, mass lesion, or acute infarction.  Bone windows show no calvarial fracture.    There is atherosclerotic calcification of the internal carotid arteries.  IMPRESSION:  1.  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 2. No evidence for acute intracranial abnormality.  CT CERVICAL SPINE  Findings: There is patient motion artifact.  Portions of the scan are repeated.  There is significant degenerative change throughout cervical spine.  No evidence for acute fracture or subluxation. Most remarkable degenerative change is identified at C3-4, C5-6. At C5-6, at both of these levels, there is significant bilateral foraminal narrowing and canal stenosis.  There is atherosclerotic change of the left carotid bulb.  IMPRESSION:  1.  Significant degenerative change. 2. No evidence for acute cervical spine abnormality.  Original Report Authenticated By: Patterson Hammersmith, [name]   Ct Cervical  Spine Wo Contrast  07/14/2011  *RADIOLOGY REPORT*  Clinical Data:  The patient fell today.  Weakness.  Head, neck pain.  Patient motion.  CT HEAD WITHOUT CONTRAST CT CERVICAL SPINE WITHOUT CONTRAST  Technique:  Multidetector CT imaging of the head and cervical spine was performed following the standard protocol without intravenous contrast.  Multiplanar CT image reconstructions of the cervical spine were also generated.  Comparison:  None  CT HEAD  Findings: There is an old lacunar infarct involving the left basal ganglia. There is no evidence for hemorrhage, mass lesion, or acute infarction.  Bone windows show no calvarial fracture.    There is atherosclerotic calcification of the internal carotid arteries.  IMPRESSION:  1.  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 2. No evidence for acute intracranial abnormality.  CT CERVICAL SPINE  Findings: There is patient motion artifact.  Portions of the scan are repeated.  There is significant degenerative change throughout cervical spine.  No evidence for acute fracture or subluxation. Most remarkable degenerative change is identified at C3-4, C5-6. At C5-6, at both of these levels, there is significant bilateral foraminal narrowing and canal stenosis.  There is atherosclerotic change of the left carotid bulb.  IMPRESSION:  1.  Significant degenerative change. 2. No evidence for acute cervical spine abnormality.  Original Report Authenticated By: Patterson Hammersmith, [name]   I reviewed above films myself and reviewed radiologist interpretations.    1. Fall   2. Confusion   3. Hypoglycemia    ECG at time 13:54 shows SR at rate 77, normal axis, normal intervals, non specific T waves laterally, slightly more pronounced compared to July 27, 2011.  Labs ok except hypoglycemia, persisting despite oral glucose.  IV D50 given.  CT and xrays shows no sig injury.  Will place in CDU observation for hypoglycemia and if glucoses can be sustained, will  Be able to discharge.  I  suspect hypoglycemia caused the confusion which was transient.    MDM  Pt had fallen earlier today.  Pt's VS are normal currently.  Oriented now, no focal deficits currently, no CP.   Pt with complaint of and mild tenderness to diffuse posterior cervical spine, will get head and C spine CT.  Since landed on low back, will get plain films, however FROM of both hips, shoulder and no sig tenderness on L spine on palpation.  Low suspicion for sig injury since ground level fall.  Pt seems to be at his baseline now.          Gavin Pound. Oletta Lamas, MD 07/14/11 1621  Gavin Pound. Oletta Lamas, MD 07/14/11 1622  Gavin Pound. Oletta Lamas, MD 07/14/11 0981

## 2011-07-14 NOTE — ED Notes (Signed)
Pt reports he stood up, became dizzy fell landing on his bottom, pt denies hitting his head, LOC, pt reports lower back, mid back, and posterior neck pain, pt states, "they just give me too much medication." Pt is a/o x4, no neuro deficits noted.

## 2011-07-14 NOTE — ED Notes (Signed)
PT AMBULATED WITH EMT Catia Todorov

## 2011-07-14 NOTE — ED Notes (Signed)
PER EMS: Patient from [facility]; patient fell this morning while feeling weak around 0630, around 11am patient not as oriented per staff; patient currently alert and oriented x 4 per EMS; patient had recent CABG, has distention to right side from surgical site.

## 2011-07-14 NOTE — Discharge Instructions (Signed)
Followup with your primary care Dr. for recheck.  Return to the emergency department for any worsening in your condition.

## 2011-07-14 NOTE — ED Provider Notes (Signed)
  Physical Exam  BP 137/75  Pulse 81  Temp(Src) 97.5 F (36.4 C) (Oral)  Resp 16  SpO2 96%  Physical Exam  ED Course  Procedures  Patient has ambulated about the department with his walker and had no difficulties and his blood sugar is maintained in a normal fashion.  Patient be sent back to the nursing facility and advised, that he will need to follow up with his primary care Dr. for recheck,.     Carlyle Dolly, PA-C 07/14/11 2152

## 2011-07-16 ENCOUNTER — Encounter: Payer: Self-pay | Admitting: Vascular Surgery

## 2011-07-16 NOTE — ED Provider Notes (Signed)
Medical screening examination/treatment/procedure(s) were conducted as a shared visit with non-physician practitioner(s) and myself.  I personally evaluated the patient during the encounter   Pleas see my original H&P for full details.    Gavin Pound. Marlane Hirschmann, MD 07/16/11 1553

## 2011-07-17 ENCOUNTER — Ambulatory Visit: Payer: Self-pay | Admitting: Vascular Surgery

## 2011-07-23 ENCOUNTER — Encounter: Payer: Self-pay | Admitting: Vascular Surgery

## 2011-07-24 ENCOUNTER — Ambulatory Visit (INDEPENDENT_AMBULATORY_CARE_PROVIDER_SITE_OTHER): Payer: Medicare Other | Admitting: Vascular Surgery

## 2011-07-24 ENCOUNTER — Encounter: Payer: Self-pay | Admitting: Vascular Surgery

## 2011-07-24 VITALS — BP 132/71 | HR 82 | Resp 14 | Ht 69.0 in | Wt 131.3 lb

## 2011-07-24 DIAGNOSIS — I6529 Occlusion and stenosis of unspecified carotid artery: Secondary | ICD-10-CM

## 2011-07-24 DIAGNOSIS — Z48812 Encounter for surgical aftercare following surgery on the circulatory system: Secondary | ICD-10-CM

## 2011-07-24 NOTE — Progress Notes (Signed)
[facility] [place] HISTORY AND PHYSICAL    History of Present Illness:  Daniel Tyler is a 71 y.o. year old male who presents for post-operative follow-up after right carotid endarterectomy. This was done combined with a coronary artery bypass grafting procedure.   Denies numbness, tingling or other neuro deficits.  Has had some occasional right-sided headaches which dissipate fairly quickly No swallowing problems.  No incisional drainage   Physical Examination  Filed Vitals:   07/24/11 1112 07/24/11 1114  BP: 125/65 132/71  Pulse: 85 82  Resp: 14 14  Height: 5\' 9"  (1.753 m) 5\' 9"  (1.753 m)  Weight: 131 lb 4.8 oz (59.557 kg) 131 lb 4.8 oz (59.557 kg)  SpO2: 97% 100%  General:  Alert and oriented, no acute distress Neck: No bruit or JVD, healing and to Skin: No rash Neurologic: Upper and lower extremity motor 5/5 and symmetric  ASSESSMENT: Doing well status post right carotid endarterectomy. He has a known moderate left-sided carotid stenosis which is asymptomatic   PLAN:  Carotid duplex exam in followup visit in 6 months   Fabienne Bruns, MD [facility] [place] Office: [phone] Pager: [phone]

## 2011-07-25 NOTE — Progress Notes (Signed)
Addended by: Sharee Pimple on: 07/25/2011 08:29 AM   Modules accepted: Orders

## 2011-07-28 ENCOUNTER — Telehealth: Payer: Self-pay

## 2011-07-28 ENCOUNTER — Other Ambulatory Visit: Payer: Self-pay | Admitting: Thoracic Surgery (Cardiothoracic Vascular Surgery)

## 2011-07-28 DIAGNOSIS — I2510 Atherosclerotic heart disease of native coronary artery without angina pectoris: Secondary | ICD-10-CM

## 2011-07-28 NOTE — Telephone Encounter (Signed)
DIANNE FROM [facility] CALLING REQUESTING AN ORDER TO DISCONTINUE [facility] [place]'S OXYGEN IN THE HOME  BEST PHONE [phone]   BEST FAX [phone]

## 2011-07-29 ENCOUNTER — Encounter: Payer: Self-pay | Admitting: Thoracic Surgery (Cardiothoracic Vascular Surgery)

## 2011-07-29 NOTE — Telephone Encounter (Signed)
Please forward to primary provider to make this decision.

## 2011-07-29 NOTE — Telephone Encounter (Signed)
Pt was in hosp in March and was put on O2 and cont on it at rehab at [facility] and until return to hosp. After 2nd hsp stay and surgery, pt was taken off O2 and did not have it at rehab at [facility] the 2nd time. [facility] just needs an order to DC the O2 (he is not using) faxed to [place] at [phone] in order for DME co to p/up equipment. If we have to see pt first, please call Morrie Sheldon at [facility] to let her know.

## 2011-07-31 NOTE — Telephone Encounter (Signed)
Please contact patient or [facility].  We'll need to evaluate Daniel Tyler prior to signing an order to d/c his O2.

## 2011-08-05 ENCOUNTER — Telehealth: Payer: Self-pay

## 2011-08-05 NOTE — Telephone Encounter (Signed)
Ok sounds good. She is supposed to have a follow up by the end of July so the changes can be discussed then if need be.

## 2011-08-05 NOTE — Telephone Encounter (Signed)
[facility] nurse called and LM on my VM stating that pt has been discharged from [facility] and she wanted [place] to know that he has stopped taking several medications d/t N/V. The meds he stopped are xanax,fluoxetine, Niferex, hydrocodone, and tramadol. Since stopping these, his N/V have stopped. If we have any ?s we can call her at [phone]. This is a pt on Dr Lenord Fellers and he saw Dr Conley Rolls last time he was in, so I am forwarding this to the PA pool.

## 2011-08-07 ENCOUNTER — Other Ambulatory Visit: Payer: Self-pay | Admitting: Thoracic Surgery (Cardiothoracic Vascular Surgery)

## 2011-08-07 DIAGNOSIS — I2510 Atherosclerotic heart disease of native coronary artery without angina pectoris: Secondary | ICD-10-CM

## 2011-08-12 ENCOUNTER — Encounter: Payer: Self-pay | Admitting: Thoracic Surgery (Cardiothoracic Vascular Surgery)

## 2011-10-07 ENCOUNTER — Telehealth: Payer: Self-pay

## 2011-10-07 NOTE — Telephone Encounter (Signed)
Junious Dresser from [facility] called regarding orders on patient from Dr. Hal Hope. Junious Dresser needs to know some things regarding billing and follow through with patient now that Dr. Hal Hope is no longer at [facility]  Please call Junious Dresser at [phone].  Since she is in [place], [place], 1 + area code is needed when calling.

## 2011-10-07 NOTE — Telephone Encounter (Signed)
Dr Lenord Fellers orders were faxed back to [place] on May 31st for signature but Frances Furbish has not gotten them. I will check on these and call her back.

## 2011-10-08 NOTE — Telephone Encounter (Signed)
Have you seen anything from [facility] about this patient?

## 2011-10-08 NOTE — Telephone Encounter (Signed)
I have not seen papers from [facility] on this patient.  If it was for Dr Hal Hope we have been giving her patient's paperwork to Dr Cleta Alberts or Porfirio Oar.

## 2011-10-13 NOTE — Telephone Encounter (Signed)
There is nothing here from [facility], they need to send it again.

## 2011-10-14 NOTE — Telephone Encounter (Signed)
Spoke w/Connie from [facility] and advised her that Amy was not able to locate any orders for pt and asked for her to refax orders to Dr Cleta Alberts or Porfirio Oar. Junious Dresser agreed and I verified fax # to make sure she had the correct one.

## 2012-01-28 ENCOUNTER — Encounter: Payer: Self-pay | Admitting: Neurosurgery

## 2012-01-29 ENCOUNTER — Ambulatory Visit: Payer: Self-pay | Admitting: Neurosurgery

## 2012-01-29 ENCOUNTER — Other Ambulatory Visit: Payer: Self-pay

## 2012-02-12 ENCOUNTER — Encounter: Payer: Self-pay | Admitting: Neurosurgery

## 2012-02-13 ENCOUNTER — Other Ambulatory Visit (INDEPENDENT_AMBULATORY_CARE_PROVIDER_SITE_OTHER): Payer: Medicare Other | Admitting: *Deleted

## 2012-02-13 ENCOUNTER — Encounter: Payer: Self-pay | Admitting: Neurosurgery

## 2012-02-13 ENCOUNTER — Ambulatory Visit (INDEPENDENT_AMBULATORY_CARE_PROVIDER_SITE_OTHER): Payer: Medicare Other | Admitting: Neurosurgery

## 2012-02-13 VITALS — BP 131/78 | HR 79 | Resp 16 | Ht 69.0 in | Wt 135.0 lb

## 2012-02-13 DIAGNOSIS — I6529 Occlusion and stenosis of unspecified carotid artery: Secondary | ICD-10-CM

## 2012-02-13 DIAGNOSIS — Z48812 Encounter for surgical aftercare following surgery on the circulatory system: Secondary | ICD-10-CM

## 2012-02-13 NOTE — Progress Notes (Signed)
[facility] [place] Carotid Office Note  CC: Carotid surveillance Referring Physician: Fields  History of Present Illness: 71 year old male patient of Dr. Darrick Penna status post right CEA in may of 2013. The patient denies any signs or symptoms of CVA, TIA, amaurosis fugax or any neural deficit. The patient denies any new medical diagnoses or recent surgery.  Past Medical History  Diagnosis Date  . Enlarged heart   . Hyperlipidemia   . Hypertension   . Myocardial infarction   . Carotid stenosis   . Coronary artery disease   . Pneumonia     hx of  . Diabetes mellitus     takes metformin & lantus  . Anemia     ROS: [x]  Positive   [ ]  Denies    General: [ ]  Weight loss, [ ]  Fever, [ ]  chills Neurologic: [ ]  Dizziness, [ ]  Blackouts, [ ]  Seizure [ ]  Stroke, [ ]  "Mini stroke", [ ]  Slurred speech, [ ]  Temporary blindness; [ ]  weakness in arms or legs, [ ]  Hoarseness Cardiac: [ ]  Chest pain/pressure, [ ]  Shortness of breath at rest [ ]  Shortness of breath with exertion, [ ]  Atrial fibrillation or irregular heartbeat Vascular: [ ]  Pain in legs with walking, [ ]  Pain in legs at rest, [ ]  Pain in legs at night,  [ ]  Non-healing ulcer, [ ]  Blood clot in vein/DVT,   Pulmonary: [ ]  Home oxygen, [ ]  Productive cough, [ ]  Coughing up blood, [ ]  Asthma,  [ ]  Wheezing Musculoskeletal:  [ ]  Arthritis, [ ]  Low back pain, [ ]  Joint pain Hematologic: [ ]  Easy Bruising, [ ]  Anemia; [ ]  Hepatitis Gastrointestinal: [ ]  Blood in stool, [ ]  Gastroesophageal Reflux/heartburn, [ ]  Trouble swallowing Urinary: [ ]  chronic Kidney disease, [ ]  on HD - [ ]  MWF or [ ]  TTHS, [ ]  Burning with urination, [ ]  Difficulty urinating Skin: [ ]  Rashes, [ ]  Wounds Psychological: [ ]  Anxiety, [ ]  Depression   Social History History  Substance Use Topics  . Smoking status: Former Smoker -- 1.0 packs/day for 20 years    Types: Cigarettes    Quit date: 02/24/1978  . Smokeless tobacco: Never Used  .  Alcohol Use: No    Family History Family History  Problem Relation Age of Onset  . Heart disease Father   . Cancer Brother   . Diabetes Brother   . Diabetes Daughter   . Anesthesia problems Neg Hx   . Hypotension Neg Hx   . Malignant hyperthermia Neg Hx   . Pseudochol deficiency Neg Hx     No Known Allergies  Current Outpatient Prescriptions  Medication Sig Dispense Refill  . acetaminophen (TYLENOL) 500 MG tablet Take 500 mg by mouth every 6 (six) hours as needed. For mild pain      . albuterol (PROVENTIL HFA;VENTOLIN HFA) 108 (90 BASE) MCG/ACT inhaler Inhale 2 puffs into the lungs every 6 (six) hours as needed. For wheezing or shortness of breath      . ALPRAZolam (XANAX) 0.25 MG tablet Take 0.25 mg by mouth At bedtime as needed. For sleep      . aspirin 81 MG chewable tablet Chew 81 mg by mouth daily.      . budesonide-formoterol (SYMBICORT) 160-4.5 MCG/ACT inhaler Inhale 2 puffs into the lungs 2 (two) times daily.  1 Inhaler  2  . clopidogrel (PLAVIX) 75 MG tablet Take 1 tablet (75 mg total) by mouth daily with  breakfast.  90 tablet  2  . doxycycline (VIBRAMYCIN) 50 MG capsule Take 100 mg by mouth 2 (two) times daily. For 10 days starting on 07/14/11      . enalapril (VASOTEC) 10 MG tablet Take 1 tablet (10 mg total) by mouth daily.  90 tablet  2  . FLUoxetine (PROZAC) 10 MG capsule Take 10 mg by mouth daily.      . furosemide (LASIX) 40 MG tablet Take 1 tablet (40 mg total) by mouth daily.  7 tablet  0  . glipiZIDE (GLUCOTROL) 10 MG tablet Take 1 tablet (10 mg total) by mouth 2 (two) times daily before a meal.  60 tablet  11  . guaiFENesin (MUCINEX) 600 MG 12 hr tablet Take 1,200 mg by mouth 2 (two) times daily.      Marland Kitchen HYDROcodone-acetaminophen (NORCO) 5-325 MG per tablet Take 1 tablet by mouth every 6 (six) hours as needed. For pain      . insulin glargine (LANTUS) 100 UNIT/ML injection Inject 17 Units into the skin at bedtime.       . Lactobacillus (ACIDOPHILUS) 10 MG CAPS Take  1 capsule by mouth daily.      Marland Kitchen loperamide (IMODIUM) 2 MG capsule Take 2 mg by mouth 4 (four) times daily as needed. For diarrhea or loose stools      . Menthol-Methyl Salicylate (MUSCLE RUB) 10-15 % CREA Apply 1 application topically as needed. For muscle pain      . metFORMIN (GLUCOPHAGE) 1000 MG tablet Take 1,000 mg by mouth 2 (two) times daily with a meal.      . metoprolol tartrate (LOPRESSOR) 12.5 mg TABS Take 0.5 tablets (12.5 mg total) by mouth 2 (two) times daily.  30 tablet  1  . polysaccharide iron (NIFEREX) 150 MG CAPS capsule Take 150 mg by mouth daily.      . potassium chloride SA (K-DUR,KLOR-CON) 20 MEQ tablet Take 2 tablets (40 mEq total) by mouth daily.  30 tablet  0  . potassium chloride SA (K-DUR,KLOR-CON) 20 MEQ tablet Take 2 tablets (40 mEq total) by mouth daily.  7 tablet  0  . promethazine (PHENERGAN) 25 MG tablet Take 25 mg by mouth every 6 (six) hours as needed. For nausea/vomiting      . sennosides-docusate sodium (SENOKOT-S) 8.6-50 MG tablet Take 1 tablet by mouth daily.      . simvastatin (ZOCOR) 40 MG tablet Take 1 tablet (40 mg total) by mouth daily at 6 PM.  90 tablet  2  . tiotropium (SPIRIVA) 18 MCG inhalation capsule Place 1 capsule (18 mcg total) into inhaler and inhale daily.  30 capsule    . traMADol (ULTRAM) 50 MG tablet Take 50 mg by mouth every 4 (four) hours as needed. For pain        Physical Examination  There were no vitals filed for this visit.  There is no height or weight on file to calculate BMI.  General:  WDWN in NAD Gait: Normal HEENT: WNL Eyes: Pupils equal Pulmonary: normal non-labored breathing , without Rales, rhonchi,  wheezing Cardiac: RRR, without  Murmurs, rubs or gallops; Abdomen: soft, NT, no masses Skin: no rashes, ulcers noted  Vascular Exam Pulses: 3+ radial pulses bilaterally Carotid bruits: Carotid pulses to auscultation no bruits are heard Extremities without ischemic changes, no Gangrene , no cellulitis; no open  wounds;  Musculoskeletal: no muscle wasting or atrophy   Neurologic: A&O X 3; Appropriate Affect ; SENSATION: normal; MOTOR FUNCTION:  moving  all extremities equally. Speech is fluent/normal  Non-Invasive Vascular Imaging CAROTID DUPLEX 02/13/2012  Right ICA 0 - 19% stenosis Left ICA 20 - 39 % stenosis   ASSESSMENT/PLAN: Asymptomatic patient doing well 7 months status post right CEA. The patient will followup in 6 months with repeat carotid duplex. The patient's questions were encouraged and answered, he is in agreement with this plan.  Lauree Chandler ANP   Clinic MD: Imogene Burn on call

## 2012-02-16 NOTE — Addendum Note (Signed)
Addended by: Sharee Pimple on: 02/16/2012 02:16 PM   Modules accepted: Orders

## 2012-06-07 ENCOUNTER — Other Ambulatory Visit: Payer: Self-pay | Admitting: Podiatry

## 2012-06-17 ENCOUNTER — Other Ambulatory Visit: Payer: Self-pay | Admitting: *Deleted

## 2012-06-17 DIAGNOSIS — L97909 Non-pressure chronic ulcer of unspecified part of unspecified lower leg with unspecified severity: Secondary | ICD-10-CM

## 2012-06-24 ENCOUNTER — Ambulatory Visit: Payer: Self-pay | Admitting: Vascular Surgery

## 2012-06-26 DIAGNOSIS — L97509 Non-pressure chronic ulcer of other part of unspecified foot with unspecified severity: Secondary | ICD-10-CM | POA: Insufficient documentation

## 2012-06-26 DIAGNOSIS — E11621 Type 2 diabetes mellitus with foot ulcer: Secondary | ICD-10-CM

## 2012-07-07 ENCOUNTER — Encounter: Payer: Self-pay | Admitting: Vascular Surgery

## 2012-07-08 ENCOUNTER — Encounter (INDEPENDENT_AMBULATORY_CARE_PROVIDER_SITE_OTHER): Payer: Medicare Other | Admitting: *Deleted

## 2012-07-08 ENCOUNTER — Ambulatory Visit (INDEPENDENT_AMBULATORY_CARE_PROVIDER_SITE_OTHER): Payer: Medicare Other | Admitting: Vascular Surgery

## 2012-07-08 ENCOUNTER — Encounter: Payer: Self-pay | Admitting: Vascular Surgery

## 2012-07-08 ENCOUNTER — Encounter (HOSPITAL_COMMUNITY): Payer: Self-pay | Admitting: Pharmacy Technician

## 2012-07-08 ENCOUNTER — Other Ambulatory Visit: Payer: Self-pay

## 2012-07-08 VITALS — BP 136/71 | HR 73 | Temp 98.2°F | Resp 16 | Ht 69.0 in | Wt 160.0 lb

## 2012-07-08 DIAGNOSIS — L97909 Non-pressure chronic ulcer of unspecified part of unspecified lower leg with unspecified severity: Secondary | ICD-10-CM

## 2012-07-08 DIAGNOSIS — I7025 Atherosclerosis of native arteries of other extremities with ulceration: Secondary | ICD-10-CM | POA: Insufficient documentation

## 2012-07-08 DIAGNOSIS — E1159 Type 2 diabetes mellitus with other circulatory complications: Secondary | ICD-10-CM

## 2012-07-08 DIAGNOSIS — I739 Peripheral vascular disease, unspecified: Secondary | ICD-10-CM

## 2012-07-08 NOTE — Progress Notes (Signed)
[facility] [place] HISTORY AND PHYSICAL   History of Present Illness:  Patient is a 72 y.o. year old male who presents for evaluation of a nonhealing wound on his right fourth toe. The patient is referred by Dr. Petery.  The patient states the wound on his toe has been present for a few months and slowly getting worse. He denies any claudication symptoms. He does complain of his feet hurt at night time. Other medical problems include hyperlipidemia, hypertension, diabetes, coronary disease. I previously did a right carotid endarterectomy in am and May of 2013 and recent duplex scan showed no recurrent stenosis. He is a former smoker but quit many years ago.  Past Medical History  Diagnosis Date  . Enlarged heart   . Hyperlipidemia   . Hypertension   . Carotid stenosis   . Coronary artery disease   . Pneumonia 2013    hx of  . Diabetes mellitus     takes metformin & lantus  . Anemia   . Myocardial infarction 2013    Past Surgical History  Procedure Laterality Date  . Cardiac catheterization  2013  . Endarterectomy  07/01/2011    Procedure: ENDARTERECTOMY CAROTID;  Surgeon: Auriana Scalia E Ladashia Demarinis, MD;  Location: [facility] OR;  Service: Vascular;  Laterality: Right;  . Carotid endarterectomy  07/01/11    Right CEA  . Coronary artery bypass graft  07/01/2011    Procedure: CORONARY ARTERY BYPASS GRAFTING (CABG);  Surgeon: Steven C Hendrickson, MD;  Location: [facility] OR;  Service: Open Heart Surgery;  Laterality: N/A;     Social History History  Substance Use Topics  . Smoking status: Former Smoker -- 1.00 packs/day for 20 years    Types: Cigarettes    Quit date: 02/24/1978  . Smokeless tobacco: Never Used  . Alcohol Use: No    Family History Family History  Problem Relation Age of Onset  . Heart disease Father   . Cancer Brother   . Diabetes Brother   . Diabetes Daughter   . Anesthesia problems Neg Hx   . Hypotension Neg Hx   . Malignant hyperthermia Neg Hx   .  Pseudochol deficiency Neg Hx     Allergies  No Known Allergies   Current Outpatient Prescriptions  Medication Sig Dispense Refill  . acetaminophen (TYLENOL) 500 MG tablet Take 500 mg by mouth every 6 (six) hours as needed. For mild pain      . aspirin 81 MG chewable tablet Chew 81 mg by mouth daily.      . cephALEXin (KEFLEX) 500 MG capsule Take 500 mg by mouth 2 (two) times daily.      . FLUoxetine (PROZAC) 10 MG capsule Take 10 mg by mouth daily.      . glipiZIDE (GLUCOTROL) 10 MG tablet Take 1 tablet (10 mg total) by mouth 2 (two) times daily before a meal.  60 tablet  11  . insulin glargine (LANTUS) 100 UNIT/ML injection Inject 17 Units into the skin at bedtime.       . metFORMIN (GLUCOPHAGE) 1000 MG tablet Take 1,000 mg by mouth 2 (two) times daily with a meal.      . metoprolol tartrate (LOPRESSOR) 12.5 mg TABS Take 0.5 tablets (12.5 mg total) by mouth 2 (two) times daily.  30 tablet  1  . polysaccharide iron (NIFEREX) 150 MG CAPS capsule Take 150 mg by mouth daily.      . albuterol (PROVENTIL HFA;VENTOLIN HFA) 108 (90 BASE) MCG/ACT inhaler Inhale   2 puffs into the lungs every 6 (six) hours as needed. For wheezing or shortness of breath      . ALPRAZolam (XANAX) 0.25 MG tablet Take 0.25 mg by mouth At bedtime as needed. For sleep      . budesonide-formoterol (SYMBICORT) 160-4.5 MCG/ACT inhaler Inhale 2 puffs into the lungs 2 (two) times daily.  1 Inhaler  2  . enalapril (VASOTEC) 10 MG tablet Take 1 tablet (10 mg total) by mouth daily.  90 tablet  2  . furosemide (LASIX) 40 MG tablet Take 1 tablet (40 mg total) by mouth daily.  7 tablet  0  . guaiFENesin (MUCINEX) 600 MG 12 hr tablet Take 1,200 mg by mouth 2 (two) times daily.      . HYDROcodone-acetaminophen (NORCO) 5-325 MG per tablet Take 1 tablet by mouth every 6 (six) hours as needed. For pain      . Lactobacillus (ACIDOPHILUS) 10 MG CAPS Take 1 capsule by mouth daily.      . loperamide (IMODIUM) 2 MG capsule Take 2 mg by mouth 4  (four) times daily as needed. For diarrhea or loose stools      . Menthol-Methyl Salicylate (MUSCLE RUB) 10-15 % CREA Apply 1 application topically as needed. For muscle pain      . potassium chloride SA (K-DUR,KLOR-CON) 20 MEQ tablet Take 2 tablets (40 mEq total) by mouth daily.  30 tablet  0  . potassium chloride SA (K-DUR,KLOR-CON) 20 MEQ tablet Take 2 tablets (40 mEq total) by mouth daily.  7 tablet  0  . promethazine (PHENERGAN) 25 MG tablet Take 25 mg by mouth every 6 (six) hours as needed. For nausea/vomiting      . sennosides-docusate sodium (SENOKOT-S) 8.6-50 MG tablet Take 1 tablet by mouth daily.      . simvastatin (ZOCOR) 40 MG tablet Take 1 tablet (40 mg total) by mouth daily at 6 PM.  90 tablet  2  . tiotropium (SPIRIVA) 18 MCG inhalation capsule Place 1 capsule (18 mcg total) into inhaler and inhale daily.  30 capsule    . traMADol (ULTRAM) 50 MG tablet Take 50 mg by mouth every 4 (four) hours as needed. For pain       No current facility-administered medications for this visit.    ROS:   General:  No weight loss, Fever, chills  HEENT: No recent headaches, no nasal bleeding, no visual changes, no sore throat  Neurologic: No dizziness, blackouts, seizures. No recent symptoms of stroke or mini- stroke. No recent episodes of slurred speech, or temporary blindness.  Cardiac: No recent episodes of chest pain/pressure, no shortness of breath at rest.  + shortness of breath with exertion.  Denies history of atrial fibrillation or irregular heartbeat  Vascular: No history of rest pain in feet.  No history of claudication.  + history of non-healing ulcer, No history of DVT   Pulmonary: No home oxygen, no productive cough, no hemoptysis,  No asthma or wheezing  Musculoskeletal:  [ ] Arthritis, [ ] Low back pain,  [ ] Joint pain  Hematologic:No history of hypercoagulable state.  No history of easy bleeding.  No history of anemia  Gastrointestinal: No hematochezia or melena,  No  gastroesophageal reflux, no trouble swallowing  Urinary: [ ] chronic Kidney disease, [ ] on HD - [ ] MWF or [ ] TTHS, [ ] Burning with urination, [ ] Frequent urination, [ ] Difficulty urinating;   Skin: No rashes  Psychological: No history of anxiety,    No history of depression   Physical Examination  Filed Vitals:   07/08/12 1530  BP: 136/71  Pulse: 73  Temp: 98.2 F (36.8 C)  TempSrc: Oral  Resp: 16  Height: 5' 9" (1.753 m)  Weight: 160 lb (72.576 kg)  SpO2: 92%    Body mass index is 23.62 kg/(m^2).  General:  Alert and oriented, no acute distress HEENT: Normal Neck: No bruit or JVD Pulmonary: Clear to auscultation bilaterally Cardiac: Regular Rate and Rhythm without murmur Abdomen: Soft, non-tender, non-distended, no mass Skin: No rash, ulceration MID shaft right fourth toe laterally with exposed bone Extremity Pulses:  2+ radial, brachial, absent femoral, dorsalis pedis, posterior tibial pulses bilaterally Musculoskeletal: No deformity or edema  Neurologic: Upper and lower extremity motor 5/5 and symmetric  DATA: The patient had bilateral ABIs today which I reviewed and interpreted. 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 Although he could have calcification of the femoral artery. Right fourth toe is not salvageable. He clearly has a limb threatening situation.   PLAN:  Aortogram with bilateral runoff possible intervention tomorrow. Risks benefits possible complications and procedure details were explained the patient today including but limited to bleeding infection contrast reaction vessel injury he understands and agrees to proceed.  Ireoluwa Gorsline, MD [facility] [place] Office: [phone] Pager: [phone]  

## 2012-07-09 ENCOUNTER — Telehealth: Payer: Self-pay | Admitting: Vascular Surgery

## 2012-07-09 ENCOUNTER — Ambulatory Visit (HOSPITAL_COMMUNITY)
Admission: RE | Admit: 2012-07-09 | Discharge: 2012-07-09 | Disposition: A | Discharge status: Home / Self Care | Payer: Medicare Other | Source: Ambulatory Visit | Attending: Vascular Surgery | Admitting: Vascular Surgery

## 2012-07-09 ENCOUNTER — Encounter (HOSPITAL_COMMUNITY)
Admission: RE | Disposition: A | Discharge status: Home / Self Care | Payer: Self-pay | Source: Ambulatory Visit | Attending: Vascular Surgery

## 2012-07-09 DIAGNOSIS — Z794 Long term (current) use of insulin: Secondary | ICD-10-CM | POA: Insufficient documentation

## 2012-07-09 DIAGNOSIS — I6529 Occlusion and stenosis of unspecified carotid artery: Secondary | ICD-10-CM | POA: Insufficient documentation

## 2012-07-09 DIAGNOSIS — I7092 Chronic total occlusion of artery of the extremities: Secondary | ICD-10-CM | POA: Insufficient documentation

## 2012-07-09 DIAGNOSIS — E119 Type 2 diabetes mellitus without complications: Secondary | ICD-10-CM | POA: Insufficient documentation

## 2012-07-09 DIAGNOSIS — L98499 Non-pressure chronic ulcer of skin of other sites with unspecified severity: Secondary | ICD-10-CM | POA: Insufficient documentation

## 2012-07-09 DIAGNOSIS — Z7982 Long term (current) use of aspirin: Secondary | ICD-10-CM | POA: Insufficient documentation

## 2012-07-09 DIAGNOSIS — I708 Atherosclerosis of other arteries: Secondary | ICD-10-CM | POA: Insufficient documentation

## 2012-07-09 DIAGNOSIS — I517 Cardiomegaly: Secondary | ICD-10-CM | POA: Insufficient documentation

## 2012-07-09 DIAGNOSIS — Z79899 Other long term (current) drug therapy: Secondary | ICD-10-CM | POA: Insufficient documentation

## 2012-07-09 DIAGNOSIS — L97509 Non-pressure chronic ulcer of other part of unspecified foot with unspecified severity: Secondary | ICD-10-CM | POA: Insufficient documentation

## 2012-07-09 DIAGNOSIS — Z87891 Personal history of nicotine dependence: Secondary | ICD-10-CM | POA: Insufficient documentation

## 2012-07-09 DIAGNOSIS — I10 Essential (primary) hypertension: Secondary | ICD-10-CM | POA: Insufficient documentation

## 2012-07-09 DIAGNOSIS — I2510 Atherosclerotic heart disease of native coronary artery without angina pectoris: Secondary | ICD-10-CM | POA: Insufficient documentation

## 2012-07-09 DIAGNOSIS — I739 Peripheral vascular disease, unspecified: Secondary | ICD-10-CM | POA: Insufficient documentation

## 2012-07-09 DIAGNOSIS — I252 Old myocardial infarction: Secondary | ICD-10-CM | POA: Insufficient documentation

## 2012-07-09 DIAGNOSIS — E785 Hyperlipidemia, unspecified: Secondary | ICD-10-CM | POA: Insufficient documentation

## 2012-07-09 HISTORY — PX: ABDOMINAL AORTAGRAM: SHX5454

## 2012-07-09 LAB — POCT I-STAT, CHEM 8
BUN: 21 mg/dL (ref 6–23)
Chloride: 104 mEq/L (ref 96–112)
HCT: 40 % (ref 39.0–52.0)
Potassium: 4.4 mEq/L (ref 3.5–5.1)

## 2012-07-09 LAB — GLUCOSE, CAPILLARY

## 2012-07-09 SURGERY — ABDOMINAL AORTAGRAM
Anesthesia: LOCAL

## 2012-07-09 MED ORDER — METOPROLOL TARTRATE 1 MG/ML IV SOLN: 2.0000 mg | INTRAVENOUS | Status: DC | PRN

## 2012-07-09 MED ORDER — LABETALOL HCL 5 MG/ML IV SOLN: 10.0000 mg | INTRAVENOUS | Status: DC | PRN

## 2012-07-09 MED ORDER — SODIUM CHLORIDE 0.45 % IV SOLN
INTRAVENOUS | Status: DC
  Administered 2012-07-09: 12:00:00 via INTRAVENOUS

## 2012-07-09 MED ORDER — ACETAMINOPHEN 325 MG PO TABS: 325.0000 mg | ORAL_TABLET | ORAL | Status: DC | PRN

## 2012-07-09 MED ORDER — MORPHINE SULFATE 10 MG/ML IJ SOLN
2.0000 mg | INTRAMUSCULAR | Status: DC | PRN
  Administered 2012-07-09: 2 mg via INTRAVENOUS

## 2012-07-09 MED ORDER — FENTANYL CITRATE 0.05 MG/ML IJ SOLN
INTRAMUSCULAR | Status: AC
  Filled 2012-07-09: qty 2

## 2012-07-09 MED ORDER — MIDAZOLAM HCL 2 MG/2ML IJ SOLN
INTRAMUSCULAR | Status: AC
  Filled 2012-07-09: qty 2

## 2012-07-09 MED ORDER — ACETAMINOPHEN 325 MG RE SUPP: 325.0000 mg | RECTAL | Status: DC | PRN

## 2012-07-09 MED ORDER — HYDRALAZINE HCL 20 MG/ML IJ SOLN: 10.0000 mg | INTRAMUSCULAR | Status: DC | PRN

## 2012-07-09 MED ORDER — GUAIFENESIN-DM 100-10 MG/5ML PO SYRP: 15.0000 mL | ORAL_SOLUTION | ORAL | Status: DC | PRN

## 2012-07-09 MED ORDER — SODIUM CHLORIDE 0.9 % IV SOLN
INTRAVENOUS | Status: DC
  Administered 2012-07-09: 09:00:00 via INTRAVENOUS

## 2012-07-09 MED ORDER — MORPHINE SULFATE 2 MG/ML IJ SOLN
INTRAMUSCULAR | Status: AC
  Filled 2012-07-09: qty 1

## 2012-07-09 MED ORDER — HEPARIN (PORCINE) IN NACL 2-0.9 UNIT/ML-% IJ SOLN
INTRAMUSCULAR | Status: AC
  Filled 2012-07-09: qty 1000

## 2012-07-09 MED ORDER — ONDANSETRON HCL 4 MG/2ML IJ SOLN: 4.0000 mg | Freq: Four times a day (QID) | INTRAMUSCULAR | Status: DC | PRN

## 2012-07-09 MED ORDER — PHENOL 1.4 % MT LIQD: 1.0000 | OROMUCOSAL | Status: DC | PRN

## 2012-07-09 MED ORDER — LIDOCAINE HCL (PF) 1 % IJ SOLN
INTRAMUSCULAR | Status: AC
  Filled 2012-07-09: qty 30

## 2012-07-09 NOTE — Telephone Encounter (Addendum)
Message copied by Fredrich Birks on Fri Jul 09, 2012  3:12 PM ------      Message from: Sherren Kerns      Created: Fri Jul 09, 2012 11:15 AM       Ultrasound left groin      Aortogram with bilat runoff      He needs vein mapping of left leg either Monday or Tuesday next week.  He will need right fem pop on Wednesday with left leg vein                  Leonette Most ------  07/09/12: left message for patient to call and confirm appt, dpm

## 2012-07-09 NOTE — H&P (View-Only) (Signed)
[facility] [place] HISTORY AND PHYSICAL   History of Present Illness:  Patient is a 72 y.o. year old male who presents for evaluation of a nonhealing wound on his right fourth toe. The patient is referred by Dr. Petery.  The patient states the wound on his toe has been present for a few months and slowly getting worse. He denies any claudication symptoms. He does complain of his feet hurt at night time. Other medical problems include hyperlipidemia, hypertension, diabetes, coronary disease. I previously did a right carotid endarterectomy in am and May of 2013 and recent duplex scan showed no recurrent stenosis. He is a former smoker but quit many years ago.  Past Medical History  Diagnosis Date  . Enlarged heart   . Hyperlipidemia   . Hypertension   . Carotid stenosis   . Coronary artery disease   . Pneumonia 2013    hx of  . Diabetes mellitus     takes metformin & lantus  . Anemia   . Myocardial infarction 2013    Past Surgical History  Procedure Laterality Date  . Cardiac catheterization  2013  . Endarterectomy  07/01/2011    Procedure: ENDARTERECTOMY CAROTID;  Surgeon: Phoenix Riesen E Anabeth Chilcott, MD;  Location: [facility] OR;  Service: Vascular;  Laterality: Right;  . Carotid endarterectomy  07/01/11    Right CEA  . Coronary artery bypass graft  07/01/2011    Procedure: CORONARY ARTERY BYPASS GRAFTING (CABG);  Surgeon: Steven C Hendrickson, MD;  Location: [facility] OR;  Service: Open Heart Surgery;  Laterality: N/A;     Social History History  Substance Use Topics  . Smoking status: Former Smoker -- 1.00 packs/day for 20 years    Types: Cigarettes    Quit date: 02/24/1978  . Smokeless tobacco: Never Used  . Alcohol Use: No    Family History Family History  Problem Relation Age of Onset  . Heart disease Father   . Cancer Brother   . Diabetes Brother   . Diabetes Daughter   . Anesthesia problems Neg Hx   . Hypotension Neg Hx   . Malignant hyperthermia Neg Hx   .  Pseudochol deficiency Neg Hx     Allergies  No Known Allergies   Current Outpatient Prescriptions  Medication Sig Dispense Refill  . acetaminophen (TYLENOL) 500 MG tablet Take 500 mg by mouth every 6 (six) hours as needed. For mild pain      . aspirin 81 MG chewable tablet Chew 81 mg by mouth daily.      . cephALEXin (KEFLEX) 500 MG capsule Take 500 mg by mouth 2 (two) times daily.      . FLUoxetine (PROZAC) 10 MG capsule Take 10 mg by mouth daily.      . glipiZIDE (GLUCOTROL) 10 MG tablet Take 1 tablet (10 mg total) by mouth 2 (two) times daily before a meal.  60 tablet  11  . insulin glargine (LANTUS) 100 UNIT/ML injection Inject 17 Units into the skin at bedtime.       . metFORMIN (GLUCOPHAGE) 1000 MG tablet Take 1,000 mg by mouth 2 (two) times daily with a meal.      . metoprolol tartrate (LOPRESSOR) 12.5 mg TABS Take 0.5 tablets (12.5 mg total) by mouth 2 (two) times daily.  30 tablet  1  . polysaccharide iron (NIFEREX) 150 MG CAPS capsule Take 150 mg by mouth daily.      . albuterol (PROVENTIL HFA;VENTOLIN HFA) 108 (90 BASE) MCG/ACT inhaler Inhale   2 puffs into the lungs every 6 (six) hours as needed. For wheezing or shortness of breath      . ALPRAZolam (XANAX) 0.25 MG tablet Take 0.25 mg by mouth At bedtime as needed. For sleep      . budesonide-formoterol (SYMBICORT) 160-4.5 MCG/ACT inhaler Inhale 2 puffs into the lungs 2 (two) times daily.  1 Inhaler  2  . enalapril (VASOTEC) 10 MG tablet Take 1 tablet (10 mg total) by mouth daily.  90 tablet  2  . furosemide (LASIX) 40 MG tablet Take 1 tablet (40 mg total) by mouth daily.  7 tablet  0  . guaiFENesin (MUCINEX) 600 MG 12 hr tablet Take 1,200 mg by mouth 2 (two) times daily.      . HYDROcodone-acetaminophen (NORCO) 5-325 MG per tablet Take 1 tablet by mouth every 6 (six) hours as needed. For pain      . Lactobacillus (ACIDOPHILUS) 10 MG CAPS Take 1 capsule by mouth daily.      . loperamide (IMODIUM) 2 MG capsule Take 2 mg by mouth 4  (four) times daily as needed. For diarrhea or loose stools      . Menthol-Methyl Salicylate (MUSCLE RUB) 10-15 % CREA Apply 1 application topically as needed. For muscle pain      . potassium chloride SA (K-DUR,KLOR-CON) 20 MEQ tablet Take 2 tablets (40 mEq total) by mouth daily.  30 tablet  0  . potassium chloride SA (K-DUR,KLOR-CON) 20 MEQ tablet Take 2 tablets (40 mEq total) by mouth daily.  7 tablet  0  . promethazine (PHENERGAN) 25 MG tablet Take 25 mg by mouth every 6 (six) hours as needed. For nausea/vomiting      . sennosides-docusate sodium (SENOKOT-S) 8.6-50 MG tablet Take 1 tablet by mouth daily.      . simvastatin (ZOCOR) 40 MG tablet Take 1 tablet (40 mg total) by mouth daily at 6 PM.  90 tablet  2  . tiotropium (SPIRIVA) 18 MCG inhalation capsule Place 1 capsule (18 mcg total) into inhaler and inhale daily.  30 capsule    . traMADol (ULTRAM) 50 MG tablet Take 50 mg by mouth every 4 (four) hours as needed. For pain       No current facility-administered medications for this visit.    ROS:   General:  No weight loss, Fever, chills  HEENT: No recent headaches, no nasal bleeding, no visual changes, no sore throat  Neurologic: No dizziness, blackouts, seizures. No recent symptoms of stroke or mini- stroke. No recent episodes of slurred speech, or temporary blindness.  Cardiac: No recent episodes of chest pain/pressure, no shortness of breath at rest.  + shortness of breath with exertion.  Denies history of atrial fibrillation or irregular heartbeat  Vascular: No history of rest pain in feet.  No history of claudication.  + history of non-healing ulcer, No history of DVT   Pulmonary: No home oxygen, no productive cough, no hemoptysis,  No asthma or wheezing  Musculoskeletal:  [ ] Arthritis, [ ] Low back pain,  [ ] Joint pain  Hematologic:No history of hypercoagulable state.  No history of easy bleeding.  No history of anemia  Gastrointestinal: No hematochezia or melena,  No  gastroesophageal reflux, no trouble swallowing  Urinary: [ ] chronic Kidney disease, [ ] on HD - [ ] MWF or [ ] TTHS, [ ] Burning with urination, [ ] Frequent urination, [ ] Difficulty urinating;   Skin: No rashes  Psychological: No history of anxiety,    No history of depression   Physical Examination  Filed Vitals:   07/08/12 1530  BP: 136/71  Pulse: 73  Temp: 98.2 F (36.8 C)  TempSrc: Oral  Resp: 16  Height: 5' 9" (1.753 m)  Weight: 160 lb (72.576 kg)  SpO2: 92%    Body mass index is 23.62 kg/(m^2).  General:  Alert and oriented, no acute distress HEENT: Normal Neck: No bruit or JVD Pulmonary: Clear to auscultation bilaterally Cardiac: Regular Rate and Rhythm without murmur Abdomen: Soft, non-tender, non-distended, no mass Skin: No rash, ulceration MID shaft right fourth toe laterally with exposed bone Extremity Pulses:  2+ radial, brachial, absent femoral, dorsalis pedis, posterior tibial pulses bilaterally Musculoskeletal: No deformity or edema  Neurologic: Upper and lower extremity motor 5/5 and symmetric  DATA: The patient had bilateral ABIs today which I reviewed and interpreted. 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 Although he could have calcification of the femoral artery. Right fourth toe is not salvageable. He clearly has a limb threatening situation.   PLAN:  Aortogram with bilateral runoff possible intervention tomorrow. Risks benefits possible complications and procedure details were explained the patient today including but limited to bleeding infection contrast reaction vessel injury he understands and agrees to proceed.  Preesha Benjamin, MD [facility]  Office: [phone] Pager: [phone]  

## 2012-07-09 NOTE — Op Note (Signed)
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 The patient was placed in supine position on the Angio table. Both groins were prepped and draped in usual sterile fashion. Local anesthesia was infiltrated over the left common femoral artery. Ultrasound was used to identify the left common femoral artery. Using ultrasound guidance the left common femoral artery was successfully cannulated. An 76 Versacore wire was threaded into the abdominal aorta under fluoroscopic guidance. A 5 French sheath was then placed over the guidewire and the left common femoral artery. This was thoroughly flushed with heparinized saline. A 5 French pigtail catheter was then placed over the guidewire into the abdominal aorta. Abdominal aortogram was obtained. This shows a patent infrarenal, aorta with patent left and right renal arteries. The left and right common and external iliac arteries are patent. The internal iliac arteries are occluded. The right and left common femoral arteries are patent.  Next the pigtail catheter was pulled down just above the aortic bifurcation and lower extremity runoff views were obtained.  In the right lower extremity, the right common femoral artery and profunda femoris artery is patent. There is a chronic long segment right superficial femoral artery occlusion. The popliteal artery reconstitutes above the knee but has several segments of stenosis. The below-knee popliteal artery is a better quality vessel. There is one-vessel runoff to the right foot via the peroneal artery. The foot is perfused by anterior and posterior communicating branches.  In the left  lower extremity the left common femoral and profunda femoris artery is patent. There is a long chronic left superficial femoral artery occlusion. The below-knee popliteal artery is reconstituted via profunda collaterals. There is one-vessel runoff via the peroneal artery which gives off the anterior and posterior communicating branches and fills the dorsalis pedis artery to the foot.  At this point the pigtail catheter was removed over guidewire. The 5 French sheath was left in place to be pulled in the holding area. The patient tolerated procedure well and there were complications.  Operative management: The patient will be scheduled for a left femoral to below-knee popliteal bypass with contralateral saphenous vein next week.  Fabienne Bruns, MD [facility] [place] Office: [phone] Pager: [phone]

## 2012-07-09 NOTE — Interval H&P Note (Signed)
History and Physical Interval Note:  07/09/2012 10:40 AM  Daniel Tyler  has presented today for surgery, with the diagnosis of PVD  The various methods of treatment have been discussed with the patient and family. After consideration of risks, benefits and other options for treatment, the patient has consented to  Procedure(s): ABDOMINAL AORTAGRAM (N/A) as a surgical intervention .  The patient's history has been reviewed, patient examined, no change in status, stable for surgery.  I have reviewed the patient's chart and labs.  Questions were answered to the patient's satisfaction.     Arnecia Ector E

## 2012-07-10 DIAGNOSIS — M7989 Other specified soft tissue disorders: Secondary | ICD-10-CM

## 2012-07-10 DIAGNOSIS — M79609 Pain in unspecified limb: Secondary | ICD-10-CM

## 2012-07-11 ENCOUNTER — Emergency Department (HOSPITAL_COMMUNITY)
Admission: EM | Admit: 2012-07-11 | Discharge: 2012-07-11 | Disposition: A | Discharge status: Home / Self Care | Payer: Medicare Other | Attending: Emergency Medicine | Admitting: Emergency Medicine

## 2012-07-11 ENCOUNTER — Encounter (HOSPITAL_COMMUNITY): Payer: Self-pay | Admitting: *Deleted

## 2012-07-11 DIAGNOSIS — G8918 Other acute postprocedural pain: Secondary | ICD-10-CM

## 2012-07-11 DIAGNOSIS — Z8701 Personal history of pneumonia (recurrent): Secondary | ICD-10-CM | POA: Insufficient documentation

## 2012-07-11 DIAGNOSIS — E785 Hyperlipidemia, unspecified: Secondary | ICD-10-CM | POA: Insufficient documentation

## 2012-07-11 DIAGNOSIS — E119 Type 2 diabetes mellitus without complications: Secondary | ICD-10-CM | POA: Insufficient documentation

## 2012-07-11 DIAGNOSIS — Z87891 Personal history of nicotine dependence: Secondary | ICD-10-CM | POA: Insufficient documentation

## 2012-07-11 DIAGNOSIS — I252 Old myocardial infarction: Secondary | ICD-10-CM | POA: Insufficient documentation

## 2012-07-11 DIAGNOSIS — Z862 Personal history of diseases of the blood and blood-forming organs and certain disorders involving the immune mechanism: Secondary | ICD-10-CM | POA: Insufficient documentation

## 2012-07-11 DIAGNOSIS — Z8679 Personal history of other diseases of the circulatory system: Secondary | ICD-10-CM | POA: Insufficient documentation

## 2012-07-11 DIAGNOSIS — Z79899 Other long term (current) drug therapy: Secondary | ICD-10-CM | POA: Insufficient documentation

## 2012-07-11 DIAGNOSIS — I2510 Atherosclerotic heart disease of native coronary artery without angina pectoris: Secondary | ICD-10-CM | POA: Insufficient documentation

## 2012-07-11 DIAGNOSIS — I10 Essential (primary) hypertension: Secondary | ICD-10-CM | POA: Insufficient documentation

## 2012-07-11 NOTE — ED Provider Notes (Signed)
Patient sent here by his vascular surgery team.  They met the patient on arrival, no ED evaluation performed.  Gerhard Munch, MD 07/11/12 1007

## 2012-07-11 NOTE — ED Notes (Signed)
Reports having heart cath done on 5/16 left groin and reports dressing is saturated and told to come here for eval.

## 2012-07-11 NOTE — Consult Note (Signed)
[facility]  Patient name: Daniel Tyler MRN: [id number] DOB: 10-20-1940 Sex: male  REASON FOR CONSULT: Bleeding left groin.   HPI: Daniel Tyler is a 72 y.o. male who underwent a diagnostic arteriogram on 07/09/12 via a left femoral approach (5 Fr sheath). He was discharged that day. This AM a family member called stating that they were on the way to the ED because he was bleeding from the left groin. She stated that the dressing was soaked in blood. I met them in the ED. He denies any significant pain in the left groin. He noted ecchymosis in the left groin and thought that he was bleeding.  The agram was done for a non-healing wound of the right 4th toe and he is scheduled for a right fempop this coming Wednesday with vein from the left leg.   Past Medical History  Diagnosis Date  . Enlarged heart   . Hyperlipidemia   . Hypertension   . Carotid stenosis   . Coronary artery disease   . Pneumonia 2013    hx of  . Diabetes mellitus     takes metformin & lantus  . Anemia   . Myocardial infarction 2013   Family History  Problem Relation Age of Onset  . Heart disease Father   . Cancer Brother   . Diabetes Brother   . Diabetes Daughter   . Anesthesia problems Neg Hx   . Hypotension Neg Hx   . Malignant hyperthermia Neg Hx   . Pseudochol deficiency Neg Hx    SOCIAL HISTORY: History  Substance Use Topics  . Smoking status: Former Smoker -- 1.00 packs/day for 20 years    Types: Cigarettes    Quit date: 02/24/1978  . Smokeless tobacco: Never Used  . Alcohol Use: No   No Known Allergies  No current facility-administered medications for this encounter.   Current Outpatient Prescriptions  Medication Sig Dispense Refill  . albuterol (PROVENTIL HFA;VENTOLIN HFA) 108 (90 BASE) MCG/ACT inhaler Inhale 2 puffs into the lungs every 6 (six) hours as needed. For wheezing or shortness of breath      . ALPRAZolam (XANAX) 0.25 MG tablet Take 0.25 mg by mouth  at bedtime. For sleep      . furosemide (LASIX) 40 MG tablet Take 40 mg by mouth daily.      Marland Kitchen glipiZIDE (GLUCOTROL) 5 MG tablet Take 5 mg by mouth 2 (two) times daily before a meal.      . insulin glargine (LANTUS) 100 UNIT/ML injection Inject 17 Units into the skin at bedtime.       . metFORMIN (GLUCOPHAGE) 1000 MG tablet Take 1,000 mg by mouth 2 (two) times daily with a meal.      . cephALEXin (KEFLEX) 500 MG capsule Take 1,000 mg by mouth 2 (two) times daily. Started 4.28.14 for 21 days ending 5.19.14      . gabapentin (NEURONTIN) 100 MG capsule Take 100 mg by mouth daily.      Marland Kitchen HYDROcodone-acetaminophen (NORCO) 5-325 MG per tablet Take 1 tablet by mouth every 6 (six) hours as needed. For pain      . meloxicam (MOBIC) 15 MG tablet Take 15 mg by mouth daily.      . metoprolol tartrate (LOPRESSOR) 25 MG tablet Take 25 mg by mouth 2 (two) times daily.      . simvastatin (ZOCOR) 40 MG tablet Take 40 mg by mouth every evening.       REVIEW  OF SYSTEMS: Arly.Keller ] denotes positive finding; [  ] denotes negative finding CARDIOVASCULAR:  [ ]  chest pain   [ ]  chest pressure   [ ]  palpitations   [ ]  orthopnea   Arly.Keller ] dyspnea on exertion   [ ]  claudication   [ ]  rest pain   [ ]  DVT   [ ]  phlebitis PULMONARY:   [ ]  productive cough   [ ]  asthma   [ ]  wheezing NEUROLOGIC:   [ ]  weakness  [ ]  paresthesias  [ ]  aphasia  [ ]  amaurosis  [ ]  dizziness HEMATOLOGIC:   [ ]  bleeding problems   [ ]  clotting disorders MUSCULOSKELETAL:  [ ]  joint pain   [ ]  joint swelling [ ]  leg swelling GASTROINTESTINAL: [ ]   blood in stool  [ ]   hematemesis GENITOURINARY:  [ ]   dysuria  [ ]   hematuria PSYCHIATRIC:  [ ]  history of major depression INTEGUMENTARY:  [ ]  rashes  Arly.Keller ] ulcers right 4th toe.  CONSTITUTIONAL:  [ ]  fever   [ ]  chills  PHYSICAL EXAM: Filed Vitals:   07/11/12 0949  BP: 146/96  Pulse: 88  Temp: 98.2 F (36.8 C)  TempSrc: Oral  Resp: 18  SpO2: 98%   There is no weight on file to calculate  BMI. GENERAL: The patient is a well-nourished male, in no acute distress. The vital signs are documented above. CARDIOVASCULAR: There is a regular rate and rhythm. Pulsatile mass left groin. No palpable pedal pulses. No blood on dressing which is completely dry.  PULMONARY: There is good air exchange bilaterally without wheezing or rales. ABDOMEN: Soft and non-tender with normal pitched bowel sounds.  MUSCULOSKELETAL: There are no major deformities or cyanosis. NEUROLOGIC: No focal weakness or paresthesias are detected. SKIN: There are no ulcers or rashes noted. PSYCHIATRIC: The patient has a normal affect.  DATA:   DUPLEX LEFT GROIN: Duplex shows no evidence of a left femoral artery pseudoaneurysm. No significant hematoma noted.  MEDICAL ISSUES: Will discharge to home. Patient discharge to home. He will return for his surgery on Wednesday.   Shadawn Hanaway S [facility] [place] Beeper: [phone]

## 2012-07-11 NOTE — Progress Notes (Signed)
VASCULAR LAB PRELIMINARY  PRELIMINARY  PRELIMINARY  PRELIMINARY  Left groin ultrasound completed.    Preliminary report:  There is no obvious evidence of pseudoaneurysm or AV fistula noted in the left lower extremity.  Ludie Pavlik, RVT 07/11/2012, 10:54 AM

## 2012-07-11 NOTE — ED Notes (Signed)
Dr. Edilia Bo at the bedside and assessed the pt.

## 2012-07-12 ENCOUNTER — Other Ambulatory Visit: Payer: Self-pay | Admitting: *Deleted

## 2012-07-12 ENCOUNTER — Other Ambulatory Visit: Payer: Self-pay

## 2012-07-12 DIAGNOSIS — I739 Peripheral vascular disease, unspecified: Secondary | ICD-10-CM

## 2012-07-12 DIAGNOSIS — Z01810 Encounter for preprocedural cardiovascular examination: Secondary | ICD-10-CM

## 2012-07-13 ENCOUNTER — Encounter (HOSPITAL_COMMUNITY): Payer: Self-pay

## 2012-07-13 ENCOUNTER — Ambulatory Visit (HOSPITAL_COMMUNITY)
Admission: RE | Admit: 2012-07-13 | Discharge: 2012-07-13 | Disposition: A | Discharge status: Home / Self Care | Payer: Medicare Other | Source: Ambulatory Visit | Attending: Anesthesiology | Admitting: Anesthesiology

## 2012-07-13 ENCOUNTER — Encounter (HOSPITAL_COMMUNITY)
Admission: RE | Admit: 2012-07-13 | Discharge: 2012-07-13 | Disposition: A | Discharge status: Home / Self Care | Payer: Medicare Other | Source: Ambulatory Visit | Attending: Vascular Surgery | Admitting: Vascular Surgery

## 2012-07-13 HISTORY — DX: Major depressive disorder, single episode, unspecified: F32.9

## 2012-07-13 HISTORY — DX: Other melanin hyperpigmentation: L81.4

## 2012-07-13 HISTORY — DX: Depression, unspecified: F32.A

## 2012-07-13 HISTORY — DX: Other symptoms and signs involving the musculoskeletal system: R29.898

## 2012-07-13 LAB — CBC
MCH: 29.1 pg (ref 26.0–34.0)
MCHC: 33.7 g/dL (ref 30.0–36.0)
RDW: 14.4 % (ref 11.5–15.5)

## 2012-07-13 LAB — COMPREHENSIVE METABOLIC PANEL
ALT: 9 U/L (ref 0–53)
Albumin: 3.5 g/dL (ref 3.5–5.2)
Alkaline Phosphatase: 60 U/L (ref 39–117)
Calcium: 9.2 mg/dL (ref 8.4–10.5)
Potassium: 4.7 mEq/L (ref 3.5–5.1)
Sodium: 132 mEq/L — ABNORMAL LOW (ref 135–145)
Total Protein: 7.8 g/dL (ref 6.0–8.3)

## 2012-07-13 LAB — SURGICAL PCR SCREEN
MRSA, PCR: NEGATIVE
Staphylococcus aureus: NEGATIVE

## 2012-07-13 LAB — TYPE AND SCREEN
ABO/RH(D): A POS
Antibody Screen: NEGATIVE

## 2012-07-13 LAB — URINALYSIS, ROUTINE W REFLEX MICROSCOPIC
Hgb urine dipstick: NEGATIVE
Ketones, ur: NEGATIVE mg/dL
Leukocytes, UA: NEGATIVE
Specific Gravity, Urine: 1.035 — ABNORMAL HIGH (ref 1.005–1.030)

## 2012-07-13 LAB — PROTIME-INR: Prothrombin Time: 12.3 seconds (ref 11.6–15.2)

## 2012-07-13 LAB — APTT: aPTT: 35 seconds (ref 24–37)

## 2012-07-13 LAB — URINE MICROSCOPIC-ADD ON

## 2012-07-13 MED ORDER — SODIUM CHLORIDE 0.9 % IV SOLN
INTRAVENOUS | Status: DC
  Administered 2012-07-14 (×3): via INTRAVENOUS

## 2012-07-13 MED ORDER — DEXTROSE 5 % IV SOLN
1.5000 g | INTRAVENOUS | Status: AC
  Administered 2012-07-14: 1.5 g via INTRAVENOUS
  Filled 2012-07-13: qty 1.5

## 2012-07-13 NOTE — Progress Notes (Signed)
Anesthesia Chart Review: Patient is a 72 year old male scheduled for right FPBG with left leg vein on 07/14/12 by Dr. Darrick Penna.  By notes, Dr. Darrick Penna feels patient "clearly has a limb threatening situation."  He is s/p CABG X 5 (LIMA to LAD, SVG to D1, sequential SVG to OM1 and OM2, endarterectomy and SVG to PDA) and right CEA on 07/01/11.  Other history includes CAD/MI, mild-moderate MR, cardiomyopathy, DM2, former smoker, HTN, PNA 03/2011, anemia, HLD. PCP was reported as Dr. Ulis Rias.  Patient told his PAT RN that he did not have a cardiologist, but he was seeing Dr. Donnie Aho prior to his CABG and had one post-op visit on 07/11/11.  Patient's PAT visit was this afternoon.  I was only given his chart to review after patient left his appointment.  Pre-CABG cardiac cath by Dr. Donnie Aho on 04/24/11 showed:  1. Significant three-vessel coronary artery disease with significant stenosis involving the circumflex and right coronary artery, ostium of the diagonal branch and mid LAD  2. Abnormal ventricular function with inferior hypokinesis. EF 40-45%.   2D Echo on 04/15/11 showed:  - Left ventricle: The cavity size was normal. Systolic function was moderately to severely reduced. The estimated ejection fraction was in the range of 30% to 35%. Moderate diffuse hypokinesis with regional variations. Severe hypokinesis of the inferior myocardium. - Mitral valve: Mild to moderate regurgitation directed Posteriorly.  EKG on 07/13/12 showed NSR, cannot rule out inferior infarct (age undetermined), cannot rule out inferior infarct (age undetermined).  I think it appears stable since 07/11/11 (Dr. York Spaniel office).  Carotid duplex on 02/13/12 showed widely patient right CEA site, 1-39% left ICA stenosis.  CXR on 07/13/12 showed: Post CABG. COPD changes. No acute abnormalities. When compared to the previous exam, questioned nodular density in the right upper lobe on the previous study is not definitely visualized on  current exam.  PFTs on 06/09/11 showed very severe airflow limitation. (Report in front of chart and under Results Review tab).   Labs from 07/09/12 and 07/13/12 noted. His non-fasting glucose is elevated at 307.  His A1C was 6.8, but that was one year ago.  I routed CMET results to Dr. Darrick Penna and [facility] nurses Okey Regal and Darel Hong.  He will get a fasting CBG on arrival.  He is on glipizide and Lantus.  I reviewed cardiac history with anesthesiologist Dr. Michelle Piper.  Plan to clinically correlate on the day of surgery.  Since patient was re-vascularized only one year ago, can likely proceed if no acute CV/CHF symptoms--and if glucose is not significantly elevated.  Velna Ochs [facility]/Anesthesiology Phone [phone] 07/13/2012 4:37 PM

## 2012-07-13 NOTE — Progress Notes (Signed)
Pt doesn't have a cardiologist  Echo and heart cath reports in epic from 2013  Medical Md is Dr.David Cloward  EKG and CXR reports in epic from 07/14/11

## 2012-07-13 NOTE — Pre-Procedure Instructions (Signed)
Arden Tinoco Lamping  07/13/2012   Your procedure is scheduled on:  Wed, May 21 @ 12:00 PM  Report to [facility] at 8:30 AM.  Call this number if you have problems the morning of surgery: [phone]   Remember:   Do not eat food or drink liquids after midnight.   Take these medicines the morning of surgery with A SIP OF WATER: Albuterol<Bring Your Inhaler With You>   Do not wear jewelry  Do not wear lotions, powders, or colognes. You may wear deodorant.  Men may shave face and neck.  Do not bring valuables to the hospital.  Contacts, dentures or bridgework may not be worn into surgery.  Leave suitcase in the car. After surgery it may be brought to your room.  For patients admitted to the hospital, checkout time is 11:00 AM the day of  discharge.   Patients discharged the day of surgery will not be allowed to drive  home.    Special Instructions: Shower using CHG 2 nights before surgery and the night before surgery.  If you shower the day of surgery use CHG.  Use special wash - you have one bottle of CHG for all showers.  You should use approximately 1/3 of the bottle for each shower.   Please read over the following fact sheets that you were given: Pain Booklet, Coughing and Deep Breathing, Blood Transfusion Information, MRSA Information and Surgical Site Infection Prevention

## 2012-07-14 ENCOUNTER — Encounter (HOSPITAL_COMMUNITY): Payer: Self-pay | Admitting: Vascular Surgery

## 2012-07-14 ENCOUNTER — Inpatient Hospital Stay (HOSPITAL_COMMUNITY): Payer: Medicare Other

## 2012-07-14 ENCOUNTER — Encounter (HOSPITAL_COMMUNITY)
Admission: RE | Disposition: A | Discharge status: Skilled Nursing Facility | Payer: Self-pay | Source: Ambulatory Visit | Attending: Vascular Surgery

## 2012-07-14 ENCOUNTER — Encounter (HOSPITAL_COMMUNITY): Payer: Self-pay | Admitting: *Deleted

## 2012-07-14 ENCOUNTER — Inpatient Hospital Stay (HOSPITAL_COMMUNITY)
Admission: RE | Admit: 2012-07-14 | Discharge: 2012-07-20 | DRG: 253 | Disposition: A | Discharge status: Skilled Nursing Facility | Payer: Medicare Other | Source: Ambulatory Visit | Attending: Vascular Surgery | Admitting: Vascular Surgery

## 2012-07-14 ENCOUNTER — Inpatient Hospital Stay (HOSPITAL_COMMUNITY): Payer: Medicare Other | Admitting: Certified Registered Nurse Anesthetist

## 2012-07-14 DIAGNOSIS — D62 Acute posthemorrhagic anemia: Secondary | ICD-10-CM | POA: Diagnosis not present

## 2012-07-14 DIAGNOSIS — Z79899 Other long term (current) drug therapy: Secondary | ICD-10-CM

## 2012-07-14 DIAGNOSIS — Z01810 Encounter for preprocedural cardiovascular examination: Secondary | ICD-10-CM

## 2012-07-14 DIAGNOSIS — M869 Osteomyelitis, unspecified: Secondary | ICD-10-CM | POA: Diagnosis present

## 2012-07-14 DIAGNOSIS — I70209 Unspecified atherosclerosis of native arteries of extremities, unspecified extremity: Principal | ICD-10-CM | POA: Diagnosis present

## 2012-07-14 DIAGNOSIS — Z794 Long term (current) use of insulin: Secondary | ICD-10-CM

## 2012-07-14 DIAGNOSIS — Z951 Presence of aortocoronary bypass graft: Secondary | ICD-10-CM

## 2012-07-14 DIAGNOSIS — I2510 Atherosclerotic heart disease of native coronary artery without angina pectoris: Secondary | ICD-10-CM | POA: Diagnosis present

## 2012-07-14 DIAGNOSIS — I70269 Atherosclerosis of native arteries of extremities with gangrene, unspecified extremity: Secondary | ICD-10-CM

## 2012-07-14 DIAGNOSIS — I252 Old myocardial infarction: Secondary | ICD-10-CM

## 2012-07-14 DIAGNOSIS — Z7982 Long term (current) use of aspirin: Secondary | ICD-10-CM

## 2012-07-14 DIAGNOSIS — E119 Type 2 diabetes mellitus without complications: Secondary | ICD-10-CM

## 2012-07-14 DIAGNOSIS — I700 Atherosclerosis of aorta: Secondary | ICD-10-CM | POA: Diagnosis present

## 2012-07-14 DIAGNOSIS — I517 Cardiomegaly: Secondary | ICD-10-CM | POA: Diagnosis present

## 2012-07-14 DIAGNOSIS — F3289 Other specified depressive episodes: Secondary | ICD-10-CM | POA: Diagnosis present

## 2012-07-14 DIAGNOSIS — I708 Atherosclerosis of other arteries: Secondary | ICD-10-CM | POA: Diagnosis present

## 2012-07-14 DIAGNOSIS — F329 Major depressive disorder, single episode, unspecified: Secondary | ICD-10-CM | POA: Diagnosis present

## 2012-07-14 DIAGNOSIS — E785 Hyperlipidemia, unspecified: Secondary | ICD-10-CM

## 2012-07-14 DIAGNOSIS — R339 Retention of urine, unspecified: Secondary | ICD-10-CM | POA: Diagnosis not present

## 2012-07-14 DIAGNOSIS — L98499 Non-pressure chronic ulcer of skin of other sites with unspecified severity: Secondary | ICD-10-CM

## 2012-07-14 DIAGNOSIS — Z87891 Personal history of nicotine dependence: Secondary | ICD-10-CM

## 2012-07-14 DIAGNOSIS — Z01812 Encounter for preprocedural laboratory examination: Secondary | ICD-10-CM

## 2012-07-14 DIAGNOSIS — I10 Essential (primary) hypertension: Secondary | ICD-10-CM

## 2012-07-14 DIAGNOSIS — Z01818 Encounter for other preprocedural examination: Secondary | ICD-10-CM

## 2012-07-14 HISTORY — PX: FEMORAL-POPLITEAL BYPASS GRAFT: SHX937

## 2012-07-14 HISTORY — PX: INTRAOPERATIVE ARTERIOGRAM: SHX5157

## 2012-07-14 HISTORY — PX: AMPUTATION: SHX166

## 2012-07-14 LAB — GLUCOSE, CAPILLARY
Glucose-Capillary: 245 mg/dL — ABNORMAL HIGH (ref 70–99)
Glucose-Capillary: 202 mg/dL — ABNORMAL HIGH (ref 70–99)
Glucose-Capillary: 135 mg/dL — ABNORMAL HIGH (ref 70–99)
Glucose-Capillary: 231 mg/dL — ABNORMAL HIGH (ref 70–99)

## 2012-07-14 SURGERY — BYPASS GRAFT FEMORAL-POPLITEAL ARTERY
Anesthesia: General | Site: Toe | Laterality: Right

## 2012-07-14 MED ORDER — DOCUSATE SODIUM 100 MG PO CAPS
100.0000 mg | ORAL_CAPSULE | Freq: Every day | ORAL | Status: DC
  Administered 2012-07-15 – 2012-07-20 (×6): 100 mg via ORAL
  Filled 2012-07-14 (×6): qty 1

## 2012-07-14 MED ORDER — GLIPIZIDE 5 MG PO TABS
5.0000 mg | ORAL_TABLET | Freq: Two times a day (BID) | ORAL | Status: DC
  Administered 2012-07-15 – 2012-07-20 (×11): 5 mg via ORAL
  Filled 2012-07-14 (×13): qty 1

## 2012-07-14 MED ORDER — MEPERIDINE HCL 25 MG/ML IJ SOLN: 6.2500 mg | INTRAMUSCULAR | Status: DC | PRN

## 2012-07-14 MED ORDER — SODIUM CHLORIDE 0.9 % IV SOLN
INTRAVENOUS | Status: DC
  Administered 2012-07-14 – 2012-07-15 (×2): via INTRAVENOUS

## 2012-07-14 MED ORDER — PHENYLEPHRINE HCL 10 MG/ML IJ SOLN
10.0000 mg | INTRAVENOUS | Status: DC | PRN
  Administered 2012-07-14: 10 ug/min via INTRAVENOUS

## 2012-07-14 MED ORDER — HEPARIN SODIUM (PORCINE) 1000 UNIT/ML IJ SOLN
INTRAMUSCULAR | Status: DC | PRN
  Administered 2012-07-14: 3000 [IU] via INTRAVENOUS
  Administered 2012-07-14: 8000 [IU] via INTRAVENOUS

## 2012-07-14 MED ORDER — SODIUM CHLORIDE 0.9 % IV SOLN: 500.0000 mL | Freq: Once | INTRAVENOUS | Status: AC | PRN

## 2012-07-14 MED ORDER — ONDANSETRON HCL 4 MG/2ML IJ SOLN
INTRAMUSCULAR | Status: DC | PRN
  Administered 2012-07-14: 4 mg via INTRAVENOUS

## 2012-07-14 MED ORDER — ROCURONIUM BROMIDE 100 MG/10ML IV SOLN
INTRAVENOUS | Status: DC | PRN
  Administered 2012-07-14: 50 mg via INTRAVENOUS

## 2012-07-14 MED ORDER — LABETALOL HCL 5 MG/ML IV SOLN
10.0000 mg | INTRAVENOUS | Status: DC | PRN
  Filled 2012-07-14: qty 4

## 2012-07-14 MED ORDER — DOPAMINE-DEXTROSE 3.2-5 MG/ML-% IV SOLN: 3.0000 ug/kg/min | INTRAVENOUS | Status: DC

## 2012-07-14 MED ORDER — OXYCODONE HCL 5 MG PO TABS
5.0000 mg | ORAL_TABLET | ORAL | Status: DC | PRN
  Administered 2012-07-15 – 2012-07-20 (×11): 10 mg via ORAL
  Filled 2012-07-14 (×11): qty 2

## 2012-07-14 MED ORDER — FENTANYL CITRATE 0.05 MG/ML IJ SOLN
INTRAMUSCULAR | Status: DC | PRN
  Administered 2012-07-14: 25 ug via INTRAVENOUS
  Administered 2012-07-14: 50 ug via INTRAVENOUS
  Administered 2012-07-14: 250 ug via INTRAVENOUS
  Administered 2012-07-14 (×2): 25 ug via INTRAVENOUS

## 2012-07-14 MED ORDER — IOHEXOL 300 MG/ML  SOLN
INTRAMUSCULAR | Status: DC | PRN
  Administered 2012-07-14: 50 mL via INTRAVENOUS

## 2012-07-14 MED ORDER — ALBUMIN HUMAN 5 % IV SOLN
INTRAVENOUS | Status: DC | PRN
  Administered 2012-07-14 (×3): via INTRAVENOUS

## 2012-07-14 MED ORDER — THROMBIN 20000 UNITS EX SOLR
CUTANEOUS | Status: AC
  Filled 2012-07-14: qty 20000

## 2012-07-14 MED ORDER — ALBUTEROL SULFATE HFA 108 (90 BASE) MCG/ACT IN AERS
2.0000 | INHALATION_SPRAY | Freq: Four times a day (QID) | RESPIRATORY_TRACT | Status: DC | PRN
  Filled 2012-07-14: qty 6.7

## 2012-07-14 MED ORDER — 0.9 % SODIUM CHLORIDE (POUR BTL) OPTIME
TOPICAL | Status: DC | PRN
  Administered 2012-07-14 (×2): 1000 mL

## 2012-07-14 MED ORDER — METOPROLOL TARTRATE 1 MG/ML IV SOLN: 2.0000 mg | INTRAVENOUS | Status: DC | PRN

## 2012-07-14 MED ORDER — ENOXAPARIN SODIUM 40 MG/0.4ML ~~LOC~~ SOLN
40.0000 mg | SUBCUTANEOUS | Status: DC
  Administered 2012-07-15 – 2012-07-19 (×5): 40 mg via SUBCUTANEOUS
  Filled 2012-07-14 (×6): qty 0.4

## 2012-07-14 MED ORDER — MIDAZOLAM HCL 2 MG/2ML IJ SOLN: 0.5000 mg | Freq: Once | INTRAMUSCULAR | Status: DC | PRN

## 2012-07-14 MED ORDER — INSULIN GLARGINE 100 UNIT/ML ~~LOC~~ SOLN
17.0000 [IU] | Freq: Every day | SUBCUTANEOUS | Status: DC
  Administered 2012-07-14 – 2012-07-19 (×6): 17 [IU] via SUBCUTANEOUS
  Filled 2012-07-14 (×9): qty 0.17

## 2012-07-14 MED ORDER — ACETAMINOPHEN 650 MG RE SUPP: 325.0000 mg | RECTAL | Status: DC | PRN

## 2012-07-14 MED ORDER — NEOSTIGMINE METHYLSULFATE 1 MG/ML IJ SOLN
INTRAMUSCULAR | Status: DC | PRN
  Administered 2012-07-14: 4 mg via INTRAVENOUS

## 2012-07-14 MED ORDER — HYDRALAZINE HCL 20 MG/ML IJ SOLN: 10.0000 mg | INTRAMUSCULAR | Status: DC | PRN

## 2012-07-14 MED ORDER — ALUM & MAG HYDROXIDE-SIMETH 200-200-20 MG/5ML PO SUSP
15.0000 mL | ORAL | Status: DC | PRN
  Administered 2012-07-15: 30 mL via ORAL
  Filled 2012-07-14: qty 30

## 2012-07-14 MED ORDER — LIDOCAINE HCL (CARDIAC) 20 MG/ML IV SOLN
INTRAVENOUS | Status: DC | PRN
  Administered 2012-07-14: 20 mg via INTRAVENOUS

## 2012-07-14 MED ORDER — ALPRAZOLAM 0.25 MG PO TABS
0.2500 mg | ORAL_TABLET | Freq: Every day | ORAL | Status: DC
  Administered 2012-07-14 – 2012-07-19 (×6): 0.25 mg via ORAL
  Filled 2012-07-14 (×6): qty 1

## 2012-07-14 MED ORDER — METFORMIN HCL 500 MG PO TABS
1000.0000 mg | ORAL_TABLET | Freq: Two times a day (BID) | ORAL | Status: DC
  Administered 2012-07-15 – 2012-07-20 (×11): 1000 mg via ORAL
  Filled 2012-07-14 (×13): qty 2

## 2012-07-14 MED ORDER — MORPHINE SULFATE 2 MG/ML IJ SOLN
2.0000 mg | INTRAMUSCULAR | Status: DC | PRN
  Administered 2012-07-14 – 2012-07-15 (×3): 2 mg via INTRAVENOUS
  Filled 2012-07-14 (×3): qty 1

## 2012-07-14 MED ORDER — ACETAMINOPHEN 325 MG PO TABS: 325.0000 mg | ORAL_TABLET | ORAL | Status: DC | PRN

## 2012-07-14 MED ORDER — SODIUM CHLORIDE 0.9 % IR SOLN
Status: DC | PRN
  Administered 2012-07-14: 14:00:00

## 2012-07-14 MED ORDER — DEXTROSE 5 % IV SOLN
1.5000 g | Freq: Two times a day (BID) | INTRAVENOUS | Status: AC
  Administered 2012-07-15 (×2): 1.5 g via INTRAVENOUS
  Filled 2012-07-14 (×2): qty 1.5

## 2012-07-14 MED ORDER — GLYCOPYRROLATE 0.2 MG/ML IJ SOLN
INTRAMUSCULAR | Status: DC | PRN
  Administered 2012-07-14: .6 mg via INTRAVENOUS

## 2012-07-14 MED ORDER — PROPOFOL 10 MG/ML IV BOLUS
INTRAVENOUS | Status: DC | PRN
  Administered 2012-07-14: 100 mg via INTRAVENOUS

## 2012-07-14 MED ORDER — PHENOL 1.4 % MT LIQD: 1.0000 | OROMUCOSAL | Status: DC | PRN

## 2012-07-14 MED ORDER — GUAIFENESIN-DM 100-10 MG/5ML PO SYRP: 15.0000 mL | ORAL_SOLUTION | ORAL | Status: DC | PRN

## 2012-07-14 MED ORDER — INSULIN ASPART 100 UNIT/ML ~~LOC~~ SOLN
0.0000 [IU] | Freq: Three times a day (TID) | SUBCUTANEOUS | Status: DC
  Administered 2012-07-15 (×2): 3 [IU] via SUBCUTANEOUS
  Administered 2012-07-16: 1 [IU] via SUBCUTANEOUS
  Administered 2012-07-16: 3 [IU] via SUBCUTANEOUS
  Administered 2012-07-17 – 2012-07-18 (×4): 2 [IU] via SUBCUTANEOUS
  Administered 2012-07-18: 1 [IU] via SUBCUTANEOUS
  Administered 2012-07-18: 3 [IU] via SUBCUTANEOUS
  Administered 2012-07-19: 2 [IU] via SUBCUTANEOUS
  Administered 2012-07-19: 3 [IU] via SUBCUTANEOUS
  Administered 2012-07-19 – 2012-07-20 (×2): 1 [IU] via SUBCUTANEOUS
  Administered 2012-07-20: 2 [IU] via SUBCUTANEOUS

## 2012-07-14 MED ORDER — OXYCODONE HCL 5 MG PO TABS: 5.0000 mg | ORAL_TABLET | Freq: Once | ORAL | Status: AC | PRN

## 2012-07-14 MED ORDER — ONDANSETRON HCL 4 MG/2ML IJ SOLN
4.0000 mg | Freq: Four times a day (QID) | INTRAMUSCULAR | Status: DC | PRN
  Administered 2012-07-15: 4 mg via INTRAVENOUS
  Filled 2012-07-14: qty 2

## 2012-07-14 MED ORDER — PANTOPRAZOLE SODIUM 40 MG PO TBEC
40.0000 mg | DELAYED_RELEASE_TABLET | Freq: Every day | ORAL | Status: DC
  Administered 2012-07-14 – 2012-07-20 (×7): 40 mg via ORAL
  Filled 2012-07-14 (×6): qty 1

## 2012-07-14 MED ORDER — PROMETHAZINE HCL 25 MG/ML IJ SOLN: 6.2500 mg | INTRAMUSCULAR | Status: DC | PRN

## 2012-07-14 MED ORDER — FUROSEMIDE 40 MG PO TABS
40.0000 mg | ORAL_TABLET | Freq: Every day | ORAL | Status: DC
  Administered 2012-07-14 – 2012-07-20 (×7): 40 mg via ORAL
  Filled 2012-07-14 (×7): qty 1

## 2012-07-14 MED ORDER — SODIUM CHLORIDE 0.9 % IV SOLN
INTRAVENOUS | Status: DC
  Administered 2012-07-14: 11:00:00 via INTRAVENOUS

## 2012-07-14 MED ORDER — FENTANYL CITRATE 0.05 MG/ML IJ SOLN: 25.0000 ug | INTRAMUSCULAR | Status: DC | PRN

## 2012-07-14 MED ORDER — OXYCODONE HCL 5 MG/5ML PO SOLN: 5.0000 mg | Freq: Once | ORAL | Status: AC | PRN

## 2012-07-14 MED ORDER — POTASSIUM CHLORIDE CRYS ER 20 MEQ PO TBCR: 20.0000 meq | EXTENDED_RELEASE_TABLET | Freq: Once | ORAL | Status: AC | PRN

## 2012-07-14 MED ORDER — METOPROLOL TARTRATE 25 MG PO TABS
25.0000 mg | ORAL_TABLET | Freq: Two times a day (BID) | ORAL | Status: DC
  Administered 2012-07-14 – 2012-07-20 (×11): 25 mg via ORAL
  Filled 2012-07-14 (×13): qty 1

## 2012-07-14 SURGICAL SUPPLY — 73 items
ADH SKN CLS APL DERMABOND .7 (GAUZE/BANDAGES/DRESSINGS) ×12
BAG ISL DRAPE 18X18 STRL (DRAPES) ×4
BAG ISOLATION DRAPE 18X18 (DRAPES) IMPLANT
BANDAGE ELASTIC 4 VELCRO ST LF (GAUZE/BANDAGES/DRESSINGS) ×1 IMPLANT
BANDAGE ESMARK 6X9 LF (GAUZE/BANDAGES/DRESSINGS) IMPLANT
BANDAGE GAUZE ELAST BULKY 4 IN (GAUZE/BANDAGES/DRESSINGS) ×1 IMPLANT
BNDG CMPR 9X6 STRL LF SNTH (GAUZE/BANDAGES/DRESSINGS)
BNDG ESMARK 6X9 LF (GAUZE/BANDAGES/DRESSINGS)
CANISTER SUCTION 2500CC (MISCELLANEOUS) ×5 IMPLANT
CLIP TI MEDIUM 24 (CLIP) ×5 IMPLANT
CLIP TI WIDE RED SMALL 24 (CLIP) ×7 IMPLANT
CLOTH BEACON ORANGE TIMEOUT ST (SAFETY) ×5 IMPLANT
COVER SURGICAL LIGHT HANDLE (MISCELLANEOUS) ×5 IMPLANT
CUFF TOURNIQUET SINGLE 24IN (TOURNIQUET CUFF) IMPLANT
CUFF TOURNIQUET SINGLE 34IN LL (TOURNIQUET CUFF) IMPLANT
CUFF TOURNIQUET SINGLE 44IN (TOURNIQUET CUFF) IMPLANT
DERMABOND ADVANCED (GAUZE/BANDAGES/DRESSINGS) ×3
DERMABOND ADVANCED .7 DNX12 (GAUZE/BANDAGES/DRESSINGS) IMPLANT
DRAIN SNY WOU (WOUND CARE) IMPLANT
DRAPE ISOLATION BAG 18X18 (DRAPES) ×1
DRAPE WARM FLUID 44X44 (DRAPE) ×5 IMPLANT
DRAPE X-RAY CASS 24X20 (DRAPES) ×1 IMPLANT
DRSG COVADERM 4X14 (GAUZE/BANDAGES/DRESSINGS) ×2 IMPLANT
DRSG COVADERM 4X8 (GAUZE/BANDAGES/DRESSINGS) ×2 IMPLANT
DRSG EMULSION OIL 3X3 NADH (GAUZE/BANDAGES/DRESSINGS) ×1 IMPLANT
ELECT REM PT RETURN 9FT ADLT (ELECTROSURGICAL) ×5
ELECTRODE REM PT RTRN 9FT ADLT (ELECTROSURGICAL) ×4 IMPLANT
EVACUATOR SILICONE 100CC (DRAIN) IMPLANT
GAUZE SPONGE 4X4 16PLY XRAY LF (GAUZE/BANDAGES/DRESSINGS) ×1 IMPLANT
GLOVE BIO SURGEON STRL SZ 6.5 (GLOVE) ×2 IMPLANT
GLOVE BIO SURGEON STRL SZ7.5 (GLOVE) ×6 IMPLANT
GLOVE BIOGEL PI IND STRL 6.5 (GLOVE) IMPLANT
GLOVE BIOGEL PI IND STRL 7.0 (GLOVE) IMPLANT
GLOVE BIOGEL PI IND STRL 7.5 (GLOVE) IMPLANT
GLOVE BIOGEL PI INDICATOR 6.5 (GLOVE) ×1
GLOVE BIOGEL PI INDICATOR 7.0 (GLOVE) ×4
GLOVE BIOGEL PI INDICATOR 7.5 (GLOVE) ×1
GLOVE SS BIOGEL STRL SZ 6.5 (GLOVE) IMPLANT
GLOVE SUPERSENSE BIOGEL SZ 6.5 (GLOVE) ×1
GLOVE SURG SS PI 7.0 STRL IVOR (GLOVE) ×2 IMPLANT
KIT BASIN OR (CUSTOM PROCEDURE TRAY) ×5 IMPLANT
KIT ROOM TURNOVER OR (KITS) ×5 IMPLANT
MARKER GRAFT CORONARY BYPASS (MISCELLANEOUS) IMPLANT
MARKER SKIN DUAL TIP RULER LAB (MISCELLANEOUS) ×1 IMPLANT
NS IRRIG 1000ML POUR BTL (IV SOLUTION) ×10 IMPLANT
PACK PERIPHERAL VASCULAR (CUSTOM PROCEDURE TRAY) ×5 IMPLANT
PAD ARMBOARD 7.5X6 YLW CONV (MISCELLANEOUS) ×10 IMPLANT
PADDING CAST COTTON 6X4 STRL (CAST SUPPLIES) IMPLANT
SET COLLECT BLD 21X3/4 12 (NEEDLE) ×1 IMPLANT
SPONGE GAUZE 4X4 12PLY (GAUZE/BANDAGES/DRESSINGS) ×1 IMPLANT
SPONGE SURGIFOAM ABS GEL 100 (HEMOSTASIS) IMPLANT
STAPLER VISISTAT 35W (STAPLE) ×3 IMPLANT
STOPCOCK 4 WAY LG BORE MALE ST (IV SETS) ×1 IMPLANT
SUT PROLENE 5 0 C 1 24 (SUTURE) ×5 IMPLANT
SUT PROLENE 6 0 CC (SUTURE) ×6 IMPLANT
SUT PROLENE 7 0 BV 1 (SUTURE) IMPLANT
SUT PROLENE 7 0 BV1 MDA (SUTURE) ×2 IMPLANT
SUT SILK 2 0 (SUTURE) ×5
SUT SILK 2 0 SH (SUTURE) ×5 IMPLANT
SUT SILK 2-0 18XBRD TIE 12 (SUTURE) IMPLANT
SUT SILK 3 0 (SUTURE) ×10
SUT SILK 3-0 18XBRD TIE 12 (SUTURE) IMPLANT
SUT VIC AB 2-0 CTX 36 (SUTURE) ×10 IMPLANT
SUT VIC AB 3-0 SH 27 (SUTURE) ×30
SUT VIC AB 3-0 SH 27X BRD (SUTURE) ×8 IMPLANT
SUT VIC AB 4-0 PS2 27 (SUTURE) ×2 IMPLANT
TAPE UMBILICAL COTTON 1/8X30 (MISCELLANEOUS) ×1 IMPLANT
TOWEL OR 17X24 6PK STRL BLUE (TOWEL DISPOSABLE) ×10 IMPLANT
TOWEL OR 17X26 10 PK STRL BLUE (TOWEL DISPOSABLE) ×11 IMPLANT
TRAY FOLEY CATH 14FRSI W/METER (CATHETERS) ×5 IMPLANT
TUBING EXTENTION W/L.L. (IV SETS) IMPLANT
UNDERPAD 30X30 INCONTINENT (UNDERPADS AND DIAPERS) ×5 IMPLANT
WATER STERILE IRR 1000ML POUR (IV SOLUTION) ×5 IMPLANT

## 2012-07-14 NOTE — Preoperative (Signed)
Beta Blockers   Reason not to administer Beta Blockers:Not Applicable, metoprolol taken 07/13/12.

## 2012-07-14 NOTE — Anesthesia Preprocedure Evaluation (Signed)
Anesthesia Evaluation  Patient identified by MRN, date of birth, ID band Patient awake    Reviewed: Allergy & Precautions, H&P , NPO status , Patient's Chart, lab work & pertinent test results, reviewed documented beta blocker date and time   History of Anesthesia Complications Negative for: history of anesthetic complications  Airway Mallampati: II TM Distance: >3 FB Neck ROM: Full    Dental  (+) Edentulous Upper and Edentulous Lower   Pulmonary neg pneumonia -, COPDformer smoker,  breath sounds clear to auscultation  Pulmonary exam normal       Cardiovascular hypertension, Pt. on medications + CAD, + Past MI, + CABG ('13) and + Peripheral Vascular Disease (s/p CEA. for fem-pop) Rhythm:Regular Rate:Normal  '13 ECHO: EF 30-35%, mild- mod MR   Neuro/Psych negative neurological ROS  negative psych ROS   GI/Hepatic negative GI ROS, Neg liver ROS,   Endo/Other  diabetes (glu 202), Well Controlled, Type 2, Insulin Dependent  Renal/GU negative Renal ROS     Musculoskeletal   Abdominal   Peds  Hematology   Anesthesia Other Findings   Reproductive/Obstetrics                           Anesthesia Physical Anesthesia Plan  ASA: III  Anesthesia Plan: General   Post-op Pain Management:    Induction: Intravenous  Airway Management Planned: Oral ETT  Additional Equipment:   Intra-op Plan:   Post-operative Plan: Extubation in OR  Informed Consent: I have reviewed the patients History and Physical, chart, labs and discussed the procedure including the risks, benefits and alternatives for the proposed anesthesia with the patient or authorized representative who has indicated his/her understanding and acceptance.     Plan Discussed with: CRNA and Surgeon  Anesthesia Plan Comments: (Plan routine monitors, GETA)        Anesthesia Quick Evaluation

## 2012-07-14 NOTE — Brief Op Note (Signed)
07/14/2012  6:03 PM  PATIENT:  Daniel Tyler  72 y.o. male  PRE-OPERATIVE DIAGNOSIS:  Peripheral Vascular Disease nonhealing wound right foot  POST-OPERATIVE DIAGNOSIS:  Peripheral Vascular Disease; Nonhealing Wound Right Foot  PROCEDURE:  Procedure(s) with comments: BYPASS GRAFT FEMORAL-POPLITEAL ARTERY (Right) - Right Femoral Popliteal Bypass with Left Leg Nonreversed Greater Saphenous Vein VEIN HARVEST (Left) - Left Greater SaphenousVein Harvest AMPUTATION DIGIT (Right) - Amputation Right Fourth Toe INTRA OPERATIVE ARTERIOGRAM (Right)  SURGEON:  Surgeon(s) and Role:    * Sherren Kerns, MD - Primary  PHYSICIAN ASSISTANT: Elpidio Eric  Findings 2-3 mm saphenous vein, 1 vessel runoff peroneal, doppler augments >80% with unpinching graft  Right fourth toe amp open  Complications: None Counts: correct SPECIMEN: right 4th toe  Fabienne Bruns, MD [facility] [place] Office: [phone] Pager: [phone]

## 2012-07-14 NOTE — Interval H&P Note (Signed)
History and Physical Interval Note:  07/14/2012 8:37 AM  Daniel Tyler  has presented today for surgery, with the diagnosis of Peripheral Vascular Disease nonhealing wound right foot  The various methods of treatment have been discussed with the patient and family. After consideration of risks, benefits and other options for treatment, the patient has consented to  Procedure(s) with comments: BYPASS GRAFT FEMORAL-POPLITEAL ARTERY (Right) - Right Femoral Popliteal Bypass with left leg vein as a surgical intervention .  The patient's history has been reviewed, patient examined, no change in status, stable for surgery.  I have reviewed the patient's chart and labs.  Questions were answered to the patient's satisfaction.    We will also do amputation of the right fourth toe today.  Risk including but not limited to myocardial events, occlusion of bypass with possible limb loss,  Limb loss despite a patent bypass, bleeding infection, death.  Pt understands and agrees to proceed.  [name] E

## 2012-07-14 NOTE — H&P (View-Only) (Signed)
[facility] [place] HISTORY AND PHYSICAL   History of Present Illness:  Patient is a 72 y.o. year old male who presents for evaluation of a nonhealing wound on his right fourth toe. The patient is referred by Dr. Elvin So.  The patient states the wound on his toe has been present for a few months and slowly getting worse. He denies any claudication symptoms. He does complain of his feet hurt at night time. Other medical problems include hyperlipidemia, hypertension, diabetes, coronary disease. I previously did a right carotid endarterectomy in am and May of 2013 and recent duplex scan showed no recurrent stenosis. He is a former smoker but quit many years ago.  Past Medical History  Diagnosis Date  . Enlarged heart   . Hyperlipidemia   . Hypertension   . Carotid stenosis   . Coronary artery disease   . Pneumonia 2013    hx of  . Diabetes mellitus     takes metformin & lantus  . Anemia   . Myocardial infarction 2013    Past Surgical History  Procedure Laterality Date  . Cardiac catheterization  2013  . Endarterectomy  07/01/2011    Procedure: ENDARTERECTOMY CAROTID;  Surgeon: Sherren Kerns, MD;  Location[facility] OR;  Service: Vascular;  Laterality: Right;  . Carotid endarterectomy  07/01/11    Right CEA  . Coronary artery bypass graft  07/01/2011    Procedure: CORONARY ARTERY BYPASS GRAFTING (CABG);  Surgeon: Loreli Slot, MD;  Location: [facility] OR;  Service: Open Heart Surgery;  Laterality: N/A;     Social History History  Substance Use Topics  . Smoking status: Former Smoker -- 1.00 packs/day for 20 years    Types: Cigarettes    Quit date: 02/24/1978  . Smokeless tobacco: Never Used  . Alcohol Use: No    Family History Family History  Problem Relation Age of Onset  . Heart disease Father   . Cancer Brother   . Diabetes Brother   . Diabetes Daughter   . Anesthesia problems Neg Hx   . Hypotension Neg Hx   . Malignant hyperthermia Neg Hx   .  Pseudochol deficiency Neg Hx     Allergies  No Known Allergies   Current Outpatient Prescriptions  Medication Sig Dispense Refill  . acetaminophen (TYLENOL) 500 MG tablet Take 500 mg by mouth every 6 (six) hours as needed. For mild pain      . aspirin 81 MG chewable tablet Chew 81 mg by mouth daily.      . cephALEXin (KEFLEX) 500 MG capsule Take 500 mg by mouth 2 (two) times daily.      Marland Kitchen FLUoxetine (PROZAC) 10 MG capsule Take 10 mg by mouth daily.      Marland Kitchen glipiZIDE (GLUCOTROL) 10 MG tablet Take 1 tablet (10 mg total) by mouth 2 (two) times daily before a meal.  60 tablet  11  . insulin glargine (LANTUS) 100 UNIT/ML injection Inject 17 Units into the skin at bedtime.       . metFORMIN (GLUCOPHAGE) 1000 MG tablet Take 1,000 mg by mouth 2 (two) times daily with a meal.      . metoprolol tartrate (LOPRESSOR) 12.5 mg TABS Take 0.5 tablets (12.5 mg total) by mouth 2 (two) times daily.  30 tablet  1  . polysaccharide iron (NIFEREX) 150 MG CAPS capsule Take 150 mg by mouth daily.      Marland Kitchen albuterol (PROVENTIL HFA;VENTOLIN HFA) 108 (90 BASE) MCG/ACT inhaler Inhale  2 puffs into the lungs every 6 (six) hours as needed. For wheezing or shortness of breath      . ALPRAZolam (XANAX) 0.25 MG tablet Take 0.25 mg by mouth At bedtime as needed. For sleep      . budesonide-formoterol (SYMBICORT) 160-4.5 MCG/ACT inhaler Inhale 2 puffs into the lungs 2 (two) times daily.  1 Inhaler  2  . enalapril (VASOTEC) 10 MG tablet Take 1 tablet (10 mg total) by mouth daily.  90 tablet  2  . furosemide (LASIX) 40 MG tablet Take 1 tablet (40 mg total) by mouth daily.  7 tablet  0  . guaiFENesin (MUCINEX) 600 MG 12 hr tablet Take 1,200 mg by mouth 2 (two) times daily.      Marland Kitchen HYDROcodone-acetaminophen (NORCO) 5-325 MG per tablet Take 1 tablet by mouth every 6 (six) hours as needed. For pain      . Lactobacillus (ACIDOPHILUS) 10 MG CAPS Take 1 capsule by mouth daily.      Marland Kitchen loperamide (IMODIUM) 2 MG capsule Take 2 mg by mouth 4  (four) times daily as needed. For diarrhea or loose stools      . Menthol-Methyl Salicylate (MUSCLE RUB) 10-15 % CREA Apply 1 application topically as needed. For muscle pain      . potassium chloride SA (K-DUR,KLOR-CON) 20 MEQ tablet Take 2 tablets (40 mEq total) by mouth daily.  30 tablet  0  . potassium chloride SA (K-DUR,KLOR-CON) 20 MEQ tablet Take 2 tablets (40 mEq total) by mouth daily.  7 tablet  0  . promethazine (PHENERGAN) 25 MG tablet Take 25 mg by mouth every 6 (six) hours as needed. For nausea/vomiting      . sennosides-docusate sodium (SENOKOT-S) 8.6-50 MG tablet Take 1 tablet by mouth daily.      . simvastatin (ZOCOR) 40 MG tablet Take 1 tablet (40 mg total) by mouth daily at 6 PM.  90 tablet  2  . tiotropium (SPIRIVA) 18 MCG inhalation capsule Place 1 capsule (18 mcg total) into inhaler and inhale daily.  30 capsule    . traMADol (ULTRAM) 50 MG tablet Take 50 mg by mouth every 4 (four) hours as needed. For pain       No current facility-administered medications for this visit.    ROS:   General:  No weight loss, Fever, chills  HEENT: No recent headaches, no nasal bleeding, no visual changes, no sore throat  Neurologic: No dizziness, blackouts, seizures. No recent symptoms of stroke or mini- stroke. No recent episodes of slurred speech, or temporary blindness.  Cardiac: No recent episodes of chest pain/pressure, no shortness of breath at rest.  + shortness of breath with exertion.  Denies history of atrial fibrillation or irregular heartbeat  Vascular: No history of rest pain in feet.  No history of claudication.  + history of non-healing ulcer, No history of DVT   Pulmonary: No home oxygen, no productive cough, no hemoptysis,  No asthma or wheezing  Musculoskeletal:  [ ]  Arthritis, [ ]  Low back pain,  [ ]  Joint pain  Hematologic:No history of hypercoagulable state.  No history of easy bleeding.  No history of anemia  Gastrointestinal: No hematochezia or melena,  No  gastroesophageal reflux, no trouble swallowing  Urinary: [ ]  chronic Kidney disease, [ ]  on HD - [ ]  MWF or [ ]  TTHS, [ ]  Burning with urination, [ ]  Frequent urination, [ ]  Difficulty urinating;   Skin: No rashes  Psychological: No history of anxiety,  No history of depression   Physical Examination  Filed Vitals:   07/08/12 1530  BP: 136/71  Pulse: 73  Temp: 98.2 F (36.8 C)  TempSrc: Oral  Resp: 16  Height: 5\' 9"  (1.753 m)  Weight: 160 lb (72.576 kg)  SpO2: 92%    Body mass index is 23.62 kg/(m^2).  General:  Alert and oriented, no acute distress HEENT: Normal Neck: No bruit or JVD Pulmonary: Clear to auscultation bilaterally Cardiac: Regular Rate and Rhythm without murmur Abdomen: Soft, non-tender, non-distended, no mass Skin: No rash, ulceration MID shaft right fourth toe laterally with exposed bone Extremity Pulses:  2+ radial, brachial, absent femoral, dorsalis pedis, posterior tibial pulses bilaterally Musculoskeletal: No deformity or edema  Neurologic: Upper and lower extremity motor 5/5 and symmetric  DATA: The patient had bilateral ABIs today which I reviewed and interpreted. 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 Although he could have calcification of the femoral artery. Right fourth toe is not salvageable. He clearly has a limb threatening situation.   PLAN:  Aortogram with bilateral runoff possible intervention tomorrow. Risks benefits possible complications and procedure details were explained the patient today including but limited to bleeding infection contrast reaction vessel injury he understands and agrees to proceed.  Fabienne Bruns, MD [facility] [place] Office: [phone] Pager: [phone]

## 2012-07-14 NOTE — OR Nursing (Signed)
Spoke with dr Imogene Burn re lack of dopplerable PT LLE/ states should have a peroneal which he then could not find with doppler, remarked it's ok he has an AT ( did not Yona Kosek)/ pt has (+) sensation and movement and nml cap refill

## 2012-07-14 NOTE — Anesthesia Procedure Notes (Signed)
Procedure Name: Intubation Date/Time: 07/14/2012 12:54 PM Performed by: Angelica Pou Pre-anesthesia Checklist: Patient identified, Timeout performed, Emergency Drugs available, Suction available and Patient being monitored Patient Re-evaluated:Patient Re-evaluated prior to inductionOxygen Delivery Method: Circle system utilized Preoxygenation: Pre-oxygenation with 100% oxygen Intubation Type: IV induction Ventilation: Mask ventilation without difficulty and Oral airway inserted - appropriate to patient size Laryngoscope Size: Mac and 3 Grade View: Grade I Tube type: Oral Tube size: 7.5 mm Number of attempts: 1 Airway Equipment and Method: Stylet and Oral airway Placement Confirmation: ETT inserted through vocal cords under direct vision,  breath sounds checked- equal and bilateral and positive ETCO2 Secured at: 23 cm Tube secured with: Tape Dental Injury: Teeth and Oropharynx as per pre-operative assessment

## 2012-07-14 NOTE — Progress Notes (Signed)
Left Lower Extremity Vein Map    Left Great Saphenous Vein  Segment Diameter Comment  1. Origin 7.29mm   2. High Thigh 3.86mm Branch  3. Mid Thigh 2.62mm   4. Low Thigh 2.69mm   5. At Knee 3.17mm   6. High Calf 3.57mm   7. Mid Calf 2.50mm   8. Ankle 3.61mm    mm    mm    mm     . Preliminary results discussed with Dr.Fields.  07/14/2012 9:54 AM Gertie Fey, RDMS, RDCS   .ms

## 2012-07-14 NOTE — Transfer of Care (Signed)
Immediate Anesthesia Transfer of Care Note  Patient: Daniel Tyler  Procedure(s) Performed: Procedure(s) with comments: BYPASS GRAFT FEMORAL-POPLITEAL ARTERY (Right) - Right Femoral Popliteal Bypass with Left Leg Nonreversed Greater Saphenous Vein VEIN HARVEST (Left) - Left Greater SaphenousVein Harvest AMPUTATION DIGIT (Right) - Amputation Right Fourth Toe INTRA OPERATIVE ARTERIOGRAM (Right)  Patient Location: PACU  Anesthesia Type:General  Level of Consciousness: awake, oriented, sedated and patient cooperative  Airway & Oxygen Therapy: Patient Spontanous Breathing and Patient connected to nasal cannula oxygen  Post-op Assessment: Report given to PACU RN, Post -op Vital signs reviewed and stable and Patient moving all extremities  Post vital signs: Reviewed and stable  Complications: No apparent anesthesia complications

## 2012-07-14 NOTE — OR Nursing (Signed)
Lower body bair hugger applied d/t coldness of LE's

## 2012-07-15 ENCOUNTER — Telehealth: Payer: Self-pay | Admitting: Vascular Surgery

## 2012-07-15 ENCOUNTER — Encounter (HOSPITAL_COMMUNITY): Payer: Self-pay

## 2012-07-15 DIAGNOSIS — I739 Peripheral vascular disease, unspecified: Secondary | ICD-10-CM

## 2012-07-15 DIAGNOSIS — L98499 Non-pressure chronic ulcer of skin of other sites with unspecified severity: Secondary | ICD-10-CM

## 2012-07-15 LAB — CBC
MCH: 28.9 pg (ref 26.0–34.0)
MCHC: 33.3 g/dL (ref 30.0–36.0)
Platelets: 297 10*3/uL (ref 150–400)

## 2012-07-15 LAB — GLUCOSE, CAPILLARY
Glucose-Capillary: 219 mg/dL — ABNORMAL HIGH (ref 70–99)
Glucose-Capillary: 217 mg/dL — ABNORMAL HIGH (ref 70–99)

## 2012-07-15 LAB — HEMOGLOBIN A1C
Hgb A1c MFr Bld: 7.4 % — ABNORMAL HIGH (ref <5.7)
Mean Plasma Glucose: 166 mg/dL — ABNORMAL HIGH (ref <117)

## 2012-07-15 LAB — BASIC METABOLIC PANEL
Calcium: 8.5 mg/dL (ref 8.4–10.5)
Creatinine, Ser: 0.75 mg/dL (ref 0.50–1.35)
GFR calc non Af Amer: >90 mL/min (ref >90)
Glucose, Bld: 228 mg/dL — ABNORMAL HIGH (ref 70–99)
Sodium: 133 mEq/L — ABNORMAL LOW (ref 135–145)

## 2012-07-15 NOTE — Progress Notes (Signed)
5/22  Spoke with patient about his diabetes.  Was diagnosed in 1999. Has been taking Lantus 17 units at bedtime and Metformin and Glipizide at home.  States that he checks his blood sugars every other day.  States that he lives at home alone.  Not sure  whether patient can go home and care for diabetes alone.  Will have staff RNs check patient on giving his own insulin and the accuracy of drawing up insulin.  Last HgbA1C on 06/26/12 was 6.8%.  Infection in foot seems to have been the culprit for high blood sugars at this time.  Will continue to follow while in hospital.  Smith Mince RN BSN CDE

## 2012-07-15 NOTE — Telephone Encounter (Signed)
Message copied by Margaretmary Eddy on Thu Jul 15, 2012  3:10 PM ------      Message from: Lorin Mercy K      Created: Thu Jul 15, 2012  8:24 AM      Regarding: schedule                   ----- Message -----         From: Dara Lords, PA-C         Sent: 07/14/2012   4:47 PM           To: Sharee Pimple, CMA            S/p right fem pop with vein harvest from left leg 07/14/12.  F/u with Dr. Darrick Penna in 2 weeks.  Will need staple removal.            Thanks,      Lelon Mast ------

## 2012-07-15 NOTE — Progress Notes (Signed)
Utilization review completed.  

## 2012-07-15 NOTE — Progress Notes (Signed)
Pt still has not voided from am catheter removal. Pt c/o feeling a lot of pressure in lower abdomen. Bladder scan completed, greater than 700 ml resulted. I/O cath performed, 1200 cc of urine drained. Pt states his stomach feels better. Pt instructed to call when he has the urge to void. Will continue to monitor.

## 2012-07-15 NOTE — Telephone Encounter (Signed)
LVM re appt info, sent letter - kf °

## 2012-07-15 NOTE — Progress Notes (Signed)
Pt. Transferred to rm. [id number] with belongings; vss; reported given to [facility], Charity fundraiser.  Vivi Martens RN

## 2012-07-15 NOTE — Progress Notes (Signed)
Pt. Has not voided since foley removal at 0600 this am; bladder scan showed 175 ml urine.  Will continue to monitor.  Vivi Martens RN

## 2012-07-15 NOTE — Progress Notes (Signed)
MD notified, Pt has not spontaneously voided in greater than 12 hours. Pt complaining of abdominal pressure, bladder scan completed, 476 ml present on scan. MD orders given to place foley for tonight. Will continue to monitor.

## 2012-07-15 NOTE — Evaluation (Signed)
Occupational Therapy Evaluation Patient Details Name: Daniel Tyler MRN: [id number] DOB: 31-Dec-1940 Today's Date: 07/15/2012 Time: 4132-4401 OT Time Calculation (min): 24 min  OT Assessment / Plan / Recommendation Clinical Impression  Pt admitted with Rt fem-pop bypass and rt 4th toe amputation.  Pt will benefit from continued acute OT services to address below problem list in prep for return home.  Pt lives alone but reports he can have friends and family provide intermittent sup/assist.  Pt will need to reach mod I level to safely return home.    OT Assessment  Patient needs continued OT Services    Follow Up Recommendations  Home health OT    Barriers to Discharge Decreased caregiver support lives alone  Equipment Recommendations  Tub/shower seat    Recommendations for Other Services    Frequency  Min 2X/week    Precautions / Restrictions Precautions Precautions: Fall Restrictions Weight Bearing Restrictions: No   Pertinent Vitals/Pain See vitals    ADL  Eating/Feeding: Performed;Independent Where Assessed - Eating/Feeding: Edge of bed Grooming: Performed;Brushing hair;Set up Where Assessed - Grooming: Unsupported sitting Upper Body Bathing: Simulated;Set up Where Assessed - Upper Body Bathing: Unsupported sitting Lower Body Bathing: Simulated;Minimal assistance Where Assessed - Lower Body Bathing: Supported sit to stand Upper Body Dressing: Simulated;Set up Where Assessed - Upper Body Dressing: Unsupported sitting Lower Body Dressing: Simulated;Minimal assistance Where Assessed - Lower Body Dressing: Supported sit to stand Toilet Transfer: Simulated;Min Pension scheme manager Method: Sit to Barista:  (bed) Equipment Used: Rolling walker Transfers/Ambulation Related to ADLs: Min guard for sit<>stand when trying to use urinal. ADL Comments: Pt c/o unable to urinate.  Pt sat EOB attempting to use urnial then stood ~2 minutes attempting to  void.      OT Diagnosis: Generalized weakness;Acute pain  OT Problem List: Decreased strength;Decreased activity tolerance;Pain;Decreased knowledge of use of DME or AE OT Treatment Interventions: Self-care/ADL training;DME and/or AE instruction;Therapeutic activities;Patient/family education   OT Goals Acute Rehab OT Goals OT Goal Formulation: With patient Time For Goal Achievement: 07/22/12 Potential to Achieve Goals: Good ADL Goals Pt Will Perform Grooming: with modified independence;Standing at sink ADL Goal: Grooming - Progress: Goal set today Pt Will Perform Lower Body Bathing: with modified independence;Sit to stand from chair;Sit to stand from bed;with adaptive equipment ADL Goal: Lower Body Bathing - Progress: Goal set today Pt Will Perform Lower Body Dressing: with modified independence;Sit to stand from chair;Sit to stand from bed;with adaptive equipment ADL Goal: Lower Body Dressing - Progress: Goal set today Pt Will Transfer to Toilet: with modified independence;Ambulation;with DME;Regular height toilet ADL Goal: Toilet Transfer - Progress: Goal set today Pt Will Perform Tub/Shower Transfer: Tub transfer;with modified independence;Ambulation;with DME;Shower seat with back ADL Goal: Web designer - Progress: Goal set today Miscellaneous OT Goals Miscellaneous OT Goal #1: Pt will retrieve ADL items at mod I level with safe use of DME.  OT Goal: Miscellaneous Goal #1 - Progress: Goal set today  Visit Information  Last OT Received On: 07/15/12 Assistance Needed: +1    Subjective Data      Prior Functioning     Home Living Lives With: Alone Available Help at Discharge: Friend(s);Family;Available PRN/intermittently Type of Home: House Home Access: Stairs to enter [employer] of Steps: 4 Home Layout: One level Bathroom Shower/Tub: Engineer, manufacturing systems: Standard Home Adaptive Equipment: Walker - rolling Additional Comments: Reports his  neighbor Gabriel Rung will come over as much as needed.   Prior Function Level of Independence: Independent  Vocation: Retired Musician: No difficulties Dominant Hand: Right         Vision/Perception     Copywriter, advertising Arousal/Alertness: Awake/alert Behavior During Therapy: WFL for tasks assessed/performed Overall Cognitive Status: Within Functional Limits for tasks assessed    Extremity/Trunk Assessment Right Upper Extremity Assessment RUE ROM/Strength/Tone: WFL for tasks assessed Left Upper Extremity Assessment LUE ROM/Strength/Tone: WFL for tasks assessed Right Lower Extremity Assessment RLE ROM/Strength/Tone: Deficits RLE ROM/Strength/Tone Deficits: functionally greater than 3/5 - not formally tested due to pain and incisions Left Lower Extremity Assessment LLE ROM/Strength/Tone: Deficits LLE ROM/Strength/Tone Deficits: functionally greater than 3/5 - not formally tested due to pain and incisions     Mobility Bed Mobility Bed Mobility: Supine to Sit;Sitting - Scoot to Edge of Bed;Sit to Supine Supine to Sit: 6: Modified independent (Device/Increase time) Sitting - Scoot to Edge of Bed: 6: Modified independent (Device/Increase time) Sit to Supine: 6: Modified independent (Device/Increase time) Details for Bed Mobility Assistance: increased time Transfers Transfers: Sit to Stand;Stand to Sit Sit to Stand: 4: Min guard;From bed;With upper extremity assist Stand to Sit: 4: Min guard;To bed;With armrests;With upper extremity assist Details for Transfer Assistance: Min guard for safety.     Exercise     Balance Balance Balance Assessed: Yes Static Sitting Balance Static Sitting - Balance Support: No upper extremity supported;Feet supported Static Sitting - Level of Assistance: 7: Independent Static Standing Balance Static Standing - Balance Support: Left upper extremity supported;During functional activity Static Standing - Level of Assistance: 5:  Stand by assistance;4: Min assist Static Standing - Comment/# of Minutes: Close guarding for safety while pt attempting to void in urinal.   End of Session OT - End of Session Equipment Utilized During Treatment: Gait belt Activity Tolerance: Patient limited by fatigue Patient left: in bed;with nursing in room;with call bell/phone within reach Nurse Communication: Mobility status  GO    07/15/2012 Cipriano Mile OTR/L Pager [phone] Office [phone]  Areg, Bialas 07/15/2012, 2:42 PM

## 2012-07-15 NOTE — Evaluation (Signed)
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 Pt currently with functional limitations due to the deficits listed below (PT Problem List). Pt will benefit from skilled PT to increase their independence and safety with mobility to allow discharge home alone with HHPT.      PT Assessment  Patient needs continued PT services    Follow Up Recommendations  Home health PT;Supervision - Intermittent (Pt does not want SNF.)    Does the patient have the potential to tolerate intense rehabilitation      Barriers to Discharge Decreased caregiver support      Equipment Recommendations  None recommended by PT    Recommendations for Other Services     Frequency Min 3X/week    Precautions / Restrictions Precautions Precautions: Fall Restrictions Weight Bearing Restrictions: No   Pertinent Vitals/Pain Pt with bil leg pain with mobility.  Pt premedicated and repositioned.      Mobility  Bed Mobility Bed Mobility: Supine to Sit;Sit to Supine;Sitting - Scoot to Edge of Bed Supine to Sit: 5: Supervision;HOB elevated Sitting - Scoot to [place] of Bed: 5: Supervision Sit to Supine: HOB elevated Details for Bed Mobility Assistance: Incr time Transfers Transfers: Sit to Stand;Stand to Sit Sit to Stand: 4: Min assist;Without upper extremity assist;From bed Stand to Sit: 4: Min assist;With upper extremity assist;To bed Details for Transfer Assistance: Pt pulling up on walker with hands despite verbal/tactile cues. Ambulation/Gait Ambulation/Gait Assistance: 4: Min guard Ambulation Distance (Feet): 30 Feet Assistive device: Rolling walker Ambulation/Gait Assistance Details: Verbal cues to stay closer to walker. Gait Pattern: Step-to  pattern;Decreased stance time - right;Decreased step length - right;Decreased step length - left;Trunk flexed Gait velocity: decr General Gait Details: Limited by pain.    Exercises     PT Diagnosis: Difficulty walking;Acute pain  PT Problem List: Decreased activity tolerance;Decreased balance;Decreased mobility;Decreased strength;Decreased knowledge of use of DME;Pain PT Treatment Interventions: DME instruction;Gait training;Functional mobility training;Stair training;Therapeutic activities;Therapeutic exercise;Balance training;Patient/family education   PT Goals Acute Rehab PT Goals PT Goal Formulation: With patient Time For Goal Achievement: 07/22/12 Potential to Achieve Goals: Good Pt will go Supine/Side to Sit: with modified independence PT Goal: Supine/Side to Sit - Progress: Goal set today Pt will go Sit to Supine/Side: with modified independence PT Goal: Sit to Supine/Side - Progress: Goal set today Pt will go Sit to Stand: with modified independence PT Goal: Sit to Stand - Progress: Goal set today Pt will go Stand to Sit: with modified independence PT Goal: Stand to Sit - Progress: Goal set today Pt will Ambulate: 51 - 150 feet;with supervision PT Goal: Ambulate - Progress: Goal set today Pt will Go Up / Down Stairs: 1-2 stairs;with min assist;with least restrictive assistive device PT Goal: Up/Down Stairs - Progress: Goal set today  Visit Information  Last PT Received On: 07/15/12 Assistance Needed: +1    Subjective Data  Subjective: "I'm going home.  I'm not going back to [facility]," pt stated. Patient Stated Goal: Go home   Prior Functioning  Home Living Lives With: Alone Type of Home: House Home Access: Stairs to enter Entrance Stairs-Number of Steps: 4 Home Layout: One level Home Adaptive Equipment: Walker - rolling Prior Function Level of Independence: Independent Vocation: Retired    Copywriter, advertising Arousal/Alertness: Awake/alert  Behavior During  Therapy: WFL for tasks assessed/performed Overall Cognitive Status: Within Functional Limits for tasks assessed    Extremity/Trunk Assessment Right Lower Extremity Assessment RLE ROM/Strength/Tone: Deficits RLE ROM/Strength/Tone Deficits: functionally greater than 3/5 - not formally tested due to pain and incisions Left Lower Extremity Assessment LLE ROM/Strength/Tone: Deficits LLE ROM/Strength/Tone Deficits: functionally greater than 3/5 - not formally tested due to pain and incisions   Balance Balance Balance Assessed: Yes Static Standing Balance Static Standing - Balance Support: Bilateral upper extremity supported Static Standing - Level of Assistance: 5: Stand by assistance  End of Session PT - End of Session Equipment Utilized During Treatment: Gait belt Activity Tolerance: Patient limited by pain Patient left: in bed;with call bell/phone within reach Nurse Communication: Mobility status  GP     [facility] 07/15/2012, 11:34 AM  Skip Mayer PT [phone]

## 2012-07-15 NOTE — Progress Notes (Addendum)
VASCULAR LAB PRELIMINARY  ARTERIAL  ABI completed:    RIGHT    LEFT    PRESSURE WAVEFORM  PRESSURE WAVEFORM  BRACHIAL 144 Triphasic BRACHIAL 121 Triphasic  AT 80 Dampened Monophasic DP 69 Biphasic         PT 95 Monophasic PT 59 Barely Audible Monophasic           RIGHT LEFT  ABI 0.66 0.48   ABIs indicate a moderate reduction in arterial flow on the right and a severe reduction on the left.  Jaiven Graveline, RVS 07/15/2012, 9:46 AM

## 2012-07-15 NOTE — Progress Notes (Signed)
[facility] [place]   Subjective  - I feel cold and hungry, some soreness   Objective 114/45 72 98.6 F (37 C) (Oral) 19 100%  Intake/Output Summary (Last 24 hours) at 07/15/12 0816 Last data filed at 07/15/12 0759  Gross per 24 hour  Intake   3050 ml  Output   1910 ml  Net   1140 ml   Right foot pink brisk cap refill, peroneal and PT doppler brisk strong monophasic, to amp site clean no incision hematoma  Left foot brisk peroneal biphasic and biphasic DP doppler, no incision hematoma   Assessment/Planning: Doing well post op day 1.  Ambulate transfer to 2000. Home when pain controlled and ambulatory. Start local wound care right foot.  Consider VAC next one to 2 days.  Acute Blood loss anemia asymptomatic follow. Hyperglycemia will involve diabetes management.  Glucose elevated in preop testing as well.  Daniel Tyler E 07/15/2012 8:16 AM --  Laboratory Lab Results:  Recent Labs  07/13/12 1440 07/15/12 0605  WBC 9.8 10.1  HGB 11.1* 8.6*  HCT 32.9* 25.8*  PLT 341 297   BMET  Recent Labs  07/13/12 1440  NA 132*  K 4.7  CL 98  CO2 18*  GLUCOSE 307*  BUN 15  CREATININE 0.82  CALCIUM 9.2    COAG Lab Results  Component Value Date   INR 0.92 07/13/2012   INR 1.42 07/01/2011   INR 1.00 06/27/2011   No results found for this basename: PTT

## 2012-07-15 NOTE — Op Note (Signed)
Procedure: 1.  Right femoral to below knee popliteal bypass with non-reversed contralateral great saphenous vein                    2.  Amputation right 4th toe with resection of metatarsal head  Preoperative diagnosis: Osteomyelitis right fourth toe  Postoperative diagnosis: Same  Anesthesia: General  Asst.: Doreatha Massed, PA-C, Regina Roczniak PA-C  Operative findings:     Calcified vessels, marginal saphenous vein 2.5-3.5 mm  Operative details: After obtaining informed consent, the patient was taken to the operating room. The patient was placed in supine position on the operating room table. After induction of general anesthesia and endotracheal intubation, a Foley catheter was placed. Next, the patient's entire right and left lower extremities were prepped and draped in the usual sterile fashion. A longitudinal incision was then made in the right groin and carried down through the subcutaneous tissues to expose the right common femoral artery.  The common femoral artery was severely calcified. There was a pulse within it. The circumflex iliac branches were dissected free circumferentially and ligated and divided between silk ties and the event that a femoral endarterectomy needed to be performed. The distal external iliac artery was dissected free circumferentially underneath the inguinal ligament.  A vessel loop was also placed around the distal external iliac artery. The superficial femoral and profunda femoris arteries were dissected free circumferentially. These are also heavily calcified. Again since I was concerned femoral endarterectomy might be necessary I dissected several centimeters down the profunda femoris to a suitable location in case a profundoplasty was necessary. A vessel loop was placed around the first branch point of the profunda.  Next a longitudinal incision was made on the medial aspect of the right leg to expose the below-knee popliteal artery. The patient had previously  had vein harvested from the right leg for coronary artery bypass grafting. The incision was deepened into the fascia at the below knee segment and the below knee popliteal space was entered.  The popliteal artery was dissected free circumferentially.  It was calcified in islands but was clampable.  A tunnel was then created between the heads of the gastrocnemius muscle subsartorial up to the groin.    Attention was then turned to the left lower extremity. A longitudinal incision was made just below the groin crease. The incision was carried down through the subcutaneous tissues to the level of the left greater saphenous vein. Proximal portion the vein was 3-1/2 mm in diameter. Next the saphenofemoral junction was identified in the medial portion of the groin incision and this was harvested through several skip incisions on the medial aspect of the leg.  Side branches were ligated and divided between silk ties or clips.  The vein was of marginal quality but I thought usable 2.5-3.5 mm diameter. I harvested the vein to the mid calf in the event that a vein patch would be necessary for femoral endarterectomy. The vein was doubly clipped distally and transected. The vein was removed and the saphenofemoral junction and oversewn with 5-0 Prolene. The vein was gently distended with heparinized saline and inspected for hemostasis. Several 7-0 Prolene sutures were required to repair a small holes in the vein.   The patient was given 10000 units of heparin.  He was also given an additional 3000 units of heparin during the course of the case.  After appropriate circulation time, the distal right external iliac artery was controlled with a small Cooley clamp. The distal common  femoral artery was controlled with a peripheral Debakey clamp.   A longitudinal opening was made in the common femoral artery on its anterior surface.  The vessel was calcified but sewable. The vein was placed in a non reversed configuration.  The  arteriotomy was extended with Pott's scissors.  The vein was spatulated and sewn end to side to the artery using a running 6 0 Prolene.  Just prior to completion anastomosis everything was forebled backbled and thoroughly flushed. Proximal clamp and distal clamps were removed and there was good pulsatile flow in the profunda femoris artery immediately. The SFA was chronically occluded.    The graft was then brought through the subsartorial tunnel down to the below-knee popliteal artery after marking for orientation. The below-knee popliteal artery was controlled proximally and distally with a Henley clamp. A longitudinal opening was made in the distal below-knee popliteal artery in an area that was fairly free of calcification. The graft was then cut to length and spatulated and sewn end of graft to side of artery using running 6-0 Prolene suture.  At completion of the anastomosis everything was forebled backbled and thoroughly flushed. The remainder of the anastomosis was completed and all clamps were removed restoring pulsatile flow to the below-knee popliteal artery. An intraoperative arteriogram was then obtained using inflow occlusion. A 21-gauge butterfly needle was introduced through the proximal aspect of the vein graft. Contrast was injected showing a patent distal anastomosis with one-vessel runoff via the peroneal artery..  The patient had monophasic to biphasic Doppler flow in the posterior tibial and peroneal areas of the foot and lower leg. He had minimal DP Doppler flow.  2 repair sutures were placed on the medial wall of the distal anastomosis. The heparin was partially reversed with protamine.  After hemostasis was obtained, the deep layers and subcutaneous layers of the below-knee popliteal incision were closed with running 3-0 Vicryl suture. The skin was closed with staples.   The saphenectomy incisions were closed with running 3 0 vicryl follow by staples.  Both points were inspected and  found to be hemostatic. This was then closed in multiple layers of running 2 0 and 3-0 Vicryl suture and 4-0 subcuticular stitch.   Attention was then turned to the patient's right foot. The right fourth toe was grasped distally and a circumferential incision was made at the base of right fourth toe. The proximal phalanx was fully debrided away with Marca Ancona. The metatarsal head of the fourth toe was also resected. There was good bleeding from the skin edge. The wound overall is fairly clean but due to the osteomyelitis the wound was left open and packed with normal saline gauze dressing.  The patient tolerated the procedure well and there were no complications. Instrument sponge and needle counts correct in the case. Patient was taken to the recovery in stable condition.  Fabienne Bruns, MD [facility] [place] Office: [phone] Pager: [phone]

## 2012-07-16 LAB — GLUCOSE, CAPILLARY
Glucose-Capillary: 147 mg/dL — ABNORMAL HIGH (ref 70–99)
Glucose-Capillary: 138 mg/dL — ABNORMAL HIGH (ref 70–99)
Glucose-Capillary: 223 mg/dL — ABNORMAL HIGH (ref 70–99)
Glucose-Capillary: 90 mg/dL (ref 70–99)

## 2012-07-16 MED ORDER — DARBEPOETIN ALFA-POLYSORBATE 150 MCG/0.3ML IJ SOLN
INTRAMUSCULAR | Status: AC
  Filled 2012-07-16: qty 0.3

## 2012-07-16 NOTE — Anesthesia Postprocedure Evaluation (Signed)
  Anesthesia Post-op Note  Patient: Daniel Tyler  Procedure(s) Performed: Procedure(s) with comments: BYPASS GRAFT FEMORAL-POPLITEAL ARTERY (Right) - Right Femoral Popliteal Bypass with Left Leg Nonreversed Greater Saphenous Vein VEIN HARVEST (Left) - Left Greater SaphenousVein Harvest AMPUTATION DIGIT (Right) - Amputation Right Fourth Toe INTRA OPERATIVE ARTERIOGRAM (Right)  Patient Location: PACU  Anesthesia Type:General  Level of Consciousness: awake  Airway and Oxygen Therapy: Patient Spontanous Breathing  Post-op Pain: mild  Post-op Assessment: Post-op Vital signs reviewed  Post-op Vital Signs: Reviewed and stable  Complications: No apparent anesthesia complications

## 2012-07-16 NOTE — Progress Notes (Signed)
Occupational Therapy Treatment Patient Details Name: Daniel Tyler MRN: [id number] DOB: Feb 10, 1941 Today's Date: 07/16/2012 Time: 4098-1191 OT Time Calculation (min): 24 min  OT Assessment / Plan / Recommendation Comments on Treatment Session Pt admitted with Rt fem-pop bypass and rt 4th toe amputation. Pt with functional decline during this session. Pt only able to take a few steps to the chair with Min a due to pain R LE. Pt nauseated. Required mod A for LB ADL. Do not feel pt is able to D/C home with [facility] services at this time. Feel pt needs rehab at SNF prior to return home, as pt is high fall risk and must be mod I to D/C home. .Pt will benefit from continued skilled OT services to facilitate D/C to SNFdue to below deficits.    Follow Up Recommendations  SNF    Barriers to Discharge   decreased caregiver support    Equipment Recommendations  Tub/shower seat    Recommendations for Other Services    Frequency Min 2X/week   Plan Discharge plan needs to be updated    Precautions / Restrictions Precautions Precautions: Fall Precaution Comments: R fourth metatarsal amputation;fem pop bypass Restrictions Weight Bearing Restrictions: No   Pertinent Vitals/Pain Did not rate. Reported tremendous pain. R upper thigh.     ADL  Grooming: Performed;Brushing hair;Set up Where Assessed - Grooming: Unsupported sitting Upper Body Bathing: Simulated;Set up Where Assessed - Upper Body Bathing: Unsupported sitting Lower Body Bathing: Simulated;Moderate assistance Where Assessed - Lower Body Bathing: Supported sit to stand Where Assessed - Upper Body Dressing: Unsupported sitting Lower Body Dressing: Performed;Moderate assistance Where Assessed - Lower Body Dressing: Supported sit to stand Toilet Transfer: Performed;Minimal Dentist Method: Sit to Barista:  (bed) Equipment Used: Rolling walker;Gait belt;Other (comment) (cast  shoe) Transfers/Ambulation Related to ADLs: min A ADL Comments: Decreased functional status today. Pt limited ezxtremely by RLE thigh pain today. Only able to ambulate a few steps to chair. Trying to sit unsafetly without surface safely behind him.    OT Diagnosis:    OT Problem List:   OT Treatment Interventions:     OT Goals Acute Rehab OT Goals Time For Goal Achievement: 07/22/12 Potential to Achieve Goals: Good ADL Goals Pt Will Perform Grooming: with modified independence;Standing at sink ADL Goal: Grooming - Progress: Progressing toward goals Pt Will Perform Lower Body Bathing: with modified independence;Sit to stand from chair;Sit to stand from bed;with adaptive equipment ADL Goal: Lower Body Bathing - Progress: Progressing toward goals Pt Will Perform Lower Body Dressing: with modified independence;Sit to stand from chair;Sit to stand from bed;with adaptive equipment ADL Goal: Lower Body Dressing - Progress: Progressing toward goals Pt Will Transfer to Toilet: with modified independence;Ambulation;with DME;Regular height toilet ADL Goal: Toilet Transfer - Progress: Progressing toward goals Pt Will Perform Tub/Shower Transfer: Tub transfer;with modified independence;Ambulation;with DME;Shower seat with back Miscellaneous OT Goals Miscellaneous OT Goal #1: Pt will retrieve ADL items at mod I level with safe use of DME.  OT Goal: Miscellaneous Goal #1 - Progress: Progressing toward goals  Visit Information  Last OT Received On: 07/16/12 Assistance Needed: +1    Subjective Data      Prior Functioning       Cognition  Cognition Arousal/Alertness: Awake/alert Behavior During Therapy: WFL for tasks assessed/performed Overall Cognitive Status: No family/caregiver present to determine baseline cognitive functioning (poor problem solving. repetition required. poor judgement )    Mobility  Bed Mobility Bed Mobility: Supine to Sit Supine  to Sit: 6: Modified independent  (Device/Increase time) Sitting - Scoot to Edge of Bed: 6: Modified independent (Device/Increase time) Sit to Supine: 6: Modified independent (Device/Increase time) Details for Bed Mobility Assistance: increased time Transfers Transfers: Sit to Stand;Stand to Sit Sit to Stand: From bed;With upper extremity assist;4: Min assist Stand to Sit: To bed;With armrests;With upper extremity assist;4: Min assist Details for Transfer Assistance: min A    Exercises      Balance Balance Balance Assessed: Yes   End of Session OT - End of Session Equipment Utilized During Treatment: Gait belt Activity Tolerance: Patient limited by fatigue;Patient limited by pain Patient left: with call bell/phone within reach;in chair Nurse Communication: Mobility status  GO     Daniel Tyler,Daniel Tyler 07/16/2012, 3:35 PM [facility], OTR/L  [phone] 07/16/2012

## 2012-07-16 NOTE — Progress Notes (Addendum)
[facility] [place]  Progress Note Bypass Surgery  Date of Surgery: 07/14/2012  Procedure(s): BYPASS GRAFT FEMORAL-POPLITEAL ARTERY VEIN HARVEST AMPUTATION DIGIT INTRA OPERATIVE ARTERIOGRAM Surgeon: Surgeon(s): Sherren Kerns, MD  2 Days Post-Op  History of Present Illness  Daniel Tyler is a 72 y.o. male who is S/P Procedure(s): BYPASS GRAFT FEMORAL-POPLITEAL ARTERY VEIN HARVEST AMPUTATION DIGIT INTRA OPERATIVE ARTERIOGRAM right.  The patient's pre-op symptoms of pain are Improved . Patients pain is well controlled.    VASC. LAB Studies:        ABI: Right 0.66;  Left 0.48;   Imaging: Dg Ang/ext/uni/or Right  07/15/2012   *RADIOLOGY REPORT*  Clinical Data: Peripheral vascular disease  RIGHT ANG/EXT/UNI/ OR  Technique: Single intraoperative spot image of the right knee and leg  Comparison:  None.  Findings: Contrast fills the superficial femoral artery and popliteal artery.  A venous bypass graft is also opacified from the thigh with its distal anastomosis to the popliteal artery below the knee.  There is slight aneurysmal dilatation at the distal anastomosis.  The peroneal artery is patent to the distal leg. Anterior tibial and posterior tibial arteries intermittently opacify.  IMPRESSION: Intraoperative angiogram as described.   Original Report Authenticated By: Jolaine Click, [name]    Significant Diagnostic Studies: CBC Lab Results  Component Value Date   WBC 10.1 07/15/2012   HGB 8.6* 07/15/2012   HCT 25.8* 07/15/2012   MCV 86.6 07/15/2012   PLT 297 07/15/2012    BMET     Component Value Date/Time   NA 133* 07/15/2012 0605   K 4.5 07/15/2012 0605   CL 97 07/15/2012 0605   CO2 20 07/15/2012 0605   GLUCOSE 228* 07/15/2012 0605   BUN 10 07/15/2012 0605   CREATININE 0.75 07/15/2012 0605   CREATININE 0.66 06/07/2011 1645   CALCIUM 8.5 07/15/2012 0605   GFRNONAA >90 07/15/2012 0605   GFRAA >90 07/15/2012 0605    COAG Lab Results  Component Value Date   INR 0.92 07/13/2012   INR 1.42 07/01/2011   INR 1.00 06/27/2011   No results found for this basename: PTT    Physical Examination  BP Readings from Last 3 Encounters:  07/16/12 133/66  07/16/12 133/66  07/13/12 142/78   Temp Readings from Last 3 Encounters:  07/16/12 98.8 F (37.1 C)   07/16/12 98.8 F (37.1 C)   07/13/12 99 F (37.2 C)    SpO2 Readings from Last 3 Encounters:  07/16/12 98%  07/16/12 98%  07/13/12 97%   Pulse Readings from Last 3 Encounters:  07/16/12 90  07/16/12 90  07/13/12 97    Pt is A&O x 3 right lower extremity and left: Incision/s is/are clean,dry.intact, and  healing with hematoma, erythema or drainage Limb is warm; with good color Left anterior tibialis doppler signal   Assessment/Plan: Pt. Doing well Post-op pain is controlled Wounds are healing well PT/OT for ambulation Continue wound care as ordered new dry dressing applied to left 4th toe area wound.  Clinton Gallant [facility]  07/16/2012 8:06 AM    Right fourth toe wound clean Peroneal and posterior tibial doppler right foot Toes pink brisk cap refill Still very sore Urinary retention after first foley removal  Will  D/c foley again tomorrow a.m. Continue to ambulate and wound care Post op ABI pending if not improved will get duplex since vein was marginal  Fabienne Bruns, MD [facility] [place] Office: [phone] Pager: [phone]

## 2012-07-16 NOTE — Progress Notes (Signed)
Physical Therapy Treatment Patient Details Name: Daniel Tyler MRN: [id number] DOB: [birth year] Today's Date: 07/16/2012 Time: 4098-1191 PT Time Calculation (min): 36 min  PT Assessment / Plan / Recommendation Comments on Treatment Session  Patient presents with increased deficits in functional mobility as well as significant concerns for safety. Do not feel patient is safe for d/c home alone and patient does not have necessary caregiver support. Spoke at length with patient regarding current abilities and level of assist required. Pt in agreement that short term SNF may be beneficial.  At this time rec d/c to SNF for safety and functional mobility.    Follow Up Recommendations  SNF;Supervision/Assistance - 24 hour (Pt does not want SNF.)     Does the patient have the potential to tolerate intense rehabilitation     Barriers to Discharge  Lives alone; decreased caregiver support      Equipment Recommendations  None recommended by PT    Recommendations for Other Services    Frequency Min 3X/week   Plan Discharge plan needs to be updated    Precautions / Restrictions Precautions Precautions: Fall Restrictions Weight Bearing Restrictions: No   Pertinent Vitals/Pain No numerical value given but patient visibly in excess pain during activity causing patient to forego safety concerns    Mobility  Bed Mobility Bed Mobility: Supine to Sit;Sitting - Scoot to Edge of Bed;Sit to Supine Supine to Sit: 6: Modified independent (Device/Increase time) Sitting - Scoot to Edge of Bed: 6: Modified independent (Device/Increase time) Details for Bed Mobility Assistance: increased time Transfers Transfers: Sit to Stand;Stand to Sit Sit to Stand: 4: Min guard;From bed;With upper extremity assist Stand to Sit: 4: Min assist;With upper extremity assist;With armrests Details for Transfer Assistance: Assist to sit for safety as pt very impulsive and trying to sit prior to reaching  surface Ambulation/Gait Ambulation/Gait Assistance: 3: Mod assist Ambulation Distance (Feet): 12 Feet Assistive device: Rolling walker Ambulation/Gait Assistance Details: Patient very unsteady; max cues for safety, patient with difficulty following instruction; gait very unsteady and pt unsafe secondary to increase pain Gait Pattern: Step-to pattern;Decreased stance time - right;Decreased step length - right;Decreased step length - left;Trunk flexed Gait velocity: decr General Gait Details: Limited by pain; attempting to abandon rw and reach across room for bed to sit down when not in close range to do so.Marland Kitchen    Exercises     PT Diagnosis:    PT Problem List:   PT Treatment Interventions:     PT Goals Acute Rehab PT Goals PT Goal Formulation: With patient Time For Goal Achievement: 07/22/12 Potential to Achieve Goals: Good Pt will go Supine/Side to Sit: with modified independence PT Goal: Supine/Side to Sit - Progress: Met Pt will go Sit to Supine/Side: with modified independence Pt will go Sit to Stand: with modified independence PT Goal: Sit to Stand - Progress: Progressing toward goal Pt will go Stand to Sit: with modified independence PT Goal: Stand to Sit - Progress: Progressing toward goal Pt will Ambulate: 51 - 150 feet;with supervision PT Goal: Ambulate - Progress: Progressing toward goal Pt will Go Up / Down Stairs: 1-2 stairs;with min assist;with least restrictive assistive device  Visit Information  Last PT Received On: 07/16/12 Assistance Needed: +1 (+2 for chair follow and safety)    Subjective Data  Subjective: "i think maybe i might have to" in response to SNF Patient Stated Goal: go home   Cognition  Cognition Arousal/Alertness: Awake/alert Behavior During Therapy: WFL for tasks assessed/performed Overall Cognitive Status:  No family/caregiver present to determine baseline cognitive functioning (impulsive,decr awareness of assist needed and deficits)     Balance  Balance Balance Assessed: Yes Static Sitting Balance Static Sitting - Balance Support: No upper extremity supported;Feet supported Static Sitting - Level of Assistance: 7: Independent Static Standing Balance Static Standing - Balance Support: Left upper extremity supported;During functional activity Static Standing - Level of Assistance: 4: Min assist Static Standing - Comment/# of Minutes: Patient with increased pain and decreased stability  End of Session PT - End of Session Equipment Utilized During Treatment: Gait belt Activity Tolerance: Patient limited by pain Patient left: in chair;with call bell/phone within reach Nurse Communication: Mobility status   GP     Fabio Asa 07/16/2012, 3:23 PM Charlotte Crumb, PT DPT  [phone]

## 2012-07-17 LAB — GLUCOSE, CAPILLARY
Glucose-Capillary: 165 mg/dL — ABNORMAL HIGH (ref 70–99)
Glucose-Capillary: 157 mg/dL — ABNORMAL HIGH (ref 70–99)

## 2012-07-17 MED ORDER — TAMSULOSIN HCL 0.4 MG PO CAPS
0.4000 mg | ORAL_CAPSULE | Freq: Every day | ORAL | Status: DC
  Administered 2012-07-17 – 2012-07-20 (×4): 0.4 mg via ORAL
  Filled 2012-07-17 (×4): qty 1

## 2012-07-17 NOTE — Progress Notes (Signed)
[facility] Progress Note  07/17/2012 8:22 AM 3 Days Post-Op  Subjective:  "feeling rough this am"  RN states they had to I&O cath pt this am.  Tm 99.2 HR 70's-80's regular 100-120's systolic 97% RA  Filed Vitals:   07/17/12 0419  BP: 100/52  Pulse: 78  Temp: 99.2 F (37.3 C)  Resp: 20    Physical Exam: Incisions:  All incisions are c/d/i; bilateral groins with ecchymosis as well as some testicular ecchymosis Extremities:  Right AT with monophasic doppler signal; toe amp site is pale  CBC    Component Value Date/Time   WBC 10.1 07/15/2012 0605   RBC 2.98* 07/15/2012 0605   HGB 8.6* 07/15/2012 0605   HCT 25.8* 07/15/2012 0605   PLT 297 07/15/2012 0605   MCV 86.6 07/15/2012 0605   MCH 28.9 07/15/2012 0605   MCHC 33.3 07/15/2012 0605   RDW 14.9 07/15/2012 0605   LYMPHSABS 1.7 07/14/2011 1403   MONOABS 1.4* 07/14/2011 1403   EOSABS 0.1 07/14/2011 1403   BASOSABS 0.0 07/14/2011 1403    BMET    Component Value Date/Time   NA 133* 07/15/2012 0605   K 4.5 07/15/2012 0605   CL 97 07/15/2012 0605   CO2 20 07/15/2012 0605   GLUCOSE 228* 07/15/2012 0605   BUN 10 07/15/2012 0605   CREATININE 0.75 07/15/2012 0605   CREATININE 0.66 06/07/2011 1645   CALCIUM 8.5 07/15/2012 0605   GFRNONAA >90 07/15/2012 0605   GFRAA >90 07/15/2012 0605    INR    Component Value Date/Time   INR 0.92 07/13/2012 1440     Assessment/Plan:  72 y.o. male is s/p:  1. Right femoral to below knee popliteal bypass with non-reversed contralateral great saphenous vein  2. 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  Will order social work consult for discharge planning to SNF -ABI's are still not done post operatively-will re-order  Doreatha Massed,  PA-C [facility] [phone] 07/17/2012 8:22 AM

## 2012-07-17 NOTE — Progress Notes (Signed)
Patient has not voided on his own today. Did an in and out cath at 11 am and got out 300. Did a bladder scan at 6:30 pm and it was . Per nursing protocol we placed a catheter. Will continue to monitor closely. Patient states he feels much better.

## 2012-07-17 NOTE — Progress Notes (Signed)
VASCULAR LAB PRELIMINARY  PRELIMINARY  PRELIMINARY  PRELIMINARY   ABI's were completed 07/15/12.  There is a tech note from 07/15/12 as well as a final report under results review.  Right ABI was 0.66.  Left ABI was 0.48.     Gabriela Irigoyen, RVT 07/17/2012, 3:04 PM

## 2012-07-17 NOTE — Progress Notes (Signed)
I agree with the above  The patient's incisions are healing nicely. He has adequate Doppler signals at the ankle Toe amputation site is healing Flomax will be added to help with urinary retention Continue with mobilization   Wells Olof Marcil

## 2012-07-18 DIAGNOSIS — L98499 Non-pressure chronic ulcer of skin of other sites with unspecified severity: Secondary | ICD-10-CM

## 2012-07-18 DIAGNOSIS — I739 Peripheral vascular disease, unspecified: Secondary | ICD-10-CM

## 2012-07-18 LAB — GLUCOSE, CAPILLARY
Glucose-Capillary: 135 mg/dL — ABNORMAL HIGH (ref 70–99)
Glucose-Capillary: 146 mg/dL — ABNORMAL HIGH (ref 70–99)

## 2012-07-18 NOTE — Progress Notes (Signed)
I agree with the above Urinary retention, the Foley catheter will remain in place today. He is on Flomax. I'll attempt to remove it tomorrow  The patient had faint Doppler signals again today. I reviewed his preoperative and postoperative ABIs. They were slightly improved. I was concerned about the patency of his graft so I had duplexed. Duplex shows a stenosis in the mid thigh but the graft remains patent. The patient's transportation site is unchanged. Continue with dressing changes.  Durene Cal

## 2012-07-18 NOTE — Progress Notes (Signed)
Right bypass graft duplex has been completed.  Preliminary = Patent graft. 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 (420 cm/s).  Called prelim to Dr. Myra Gianotti.   Farrel Demark, RDMS, RVT 07/18/2012

## 2012-07-18 NOTE — Progress Notes (Signed)
Clinical Social Work Department BRIEF PSYCHOSOCIAL ASSESSMENT 07/18/2012  Patient:  Daniel Tyler, Daniel Tyler     Account Number:  [id number]     Admit date:  07/14/2012  Clinical Social Worker:  Robin Searing  Date/Time:  07/18/2012 03:57 PM  Referred by:  Physician  Date Referred:  07/18/2012 Referred for  SNF Placement   Other Referral:   Interview type:  Patient Other interview type:    PSYCHOSOCIAL DATA Living Status:  FAMILY Admitted from facility:   Level of care:   Primary support name:  brother- robert Primary support relationship to patient:  FAMILY Degree of support available:   good    CURRENT CONCERNS Current Concerns  Post-Acute Placement   Other Concerns:    SOCIAL WORK ASSESSMENT / PLAN Spoke with patient today to discuss SNF- he is hopeful for d/c home- states he has been to SNF in the past. He is agreeabel to considering SNF   Assessment/plan status:  Other - See comment Other assessment/ plan:   will complete FL2 and Pasarr for SNF search   Information/referral to community resources:   SNF    PATIENT'S/FAMILY'S RESPONSE TO PLAN OF CARE: Will proceed with SNF search and have CSW f/u for further dispo       Reece Levy, MSW, [employer] [phone]

## 2012-07-18 NOTE — Progress Notes (Addendum)
Clinical Social Work Department CLINICAL SOCIAL WORK PLACEMENT NOTE 07/18/2012  Patient:  Daniel Tyler, Daniel Tyler  Account Number:  [id number] Admit date:  07/14/2012  Clinical Social Worker:  Robin Searing  Date/time:  07/18/2012 04:13 PM  Clinical Social Work is seeking post-discharge placement for this patient at the following level of care:   SKILLED NURSING   (*CSW will update this form in Epic as items are completed)   07/18/2012  Patient/family provided with [facility] Department of Clinical Social Work's list of facilities offering this level of care within the geographic area requested by the patient (or if unable, by the patient's family).  07/18/2012  Patient/family informed of their freedom to choose among providers that offer the needed level of care, that participate in Medicare, Medicaid or managed care program needed by the patient, have an available bed and are willing to accept the patient.  07/18/2012  Patient/family informed of [facility]' ownership interest in [facility], as well as of the fact that they are under no obligation to receive care at this facility.  PASARR submitted to EDS on 07/18/2012 PASARR number received from EDS on 07/18/2012  FL2 transmitted to all facilities in geographic area requested by pt/family on  07/18/2012 FL2 transmitted to all facilities within larger geographic area on   Patient informed that his/her managed care company has contracts with or will negotiate with  certain facilities, including the following:     Patient/family informed of bed offers received:  07/20/2012 Patient chooses bed at  [facility] Physician recommends and patient chooses bed at    Patient to be transferred to [facility] on 07/20/12   Patient to be transferred to facility by  Ambulance  The following physician request were entered in Epic:   Additional Comments: Reece Levy, MSW, Theresia Majors [phone]

## 2012-07-18 NOTE — Progress Notes (Signed)
[facility] Progress Note  07/18/2012 10:12 AM 4 Days Post-Op  Subjective:  "that toe hurts"  Afebrile VSS 96% RA  Filed Vitals:   07/18/12 0402  BP: 107/53  Pulse: 84  Temp: 98.6 F (37 C)  Resp: 19    Physical Exam: Incisions:  C/d/i and unchanged from yesterday Extremities:  Toe amp site healing; difficulty obtaining doppler signal. Monophasic peroneal signal right.  CBC    Component Value Date/Time   WBC 10.1 07/15/2012 0605   RBC 2.98* 07/15/2012 0605   HGB 8.6* 07/15/2012 0605   HCT 25.8* 07/15/2012 0605   PLT 297 07/15/2012 0605   MCV 86.6 07/15/2012 0605   MCH 28.9 07/15/2012 0605   MCHC 33.3 07/15/2012 0605   RDW 14.9 07/15/2012 0605   LYMPHSABS 1.7 07/14/2011 1403   MONOABS 1.4* 07/14/2011 1403   EOSABS 0.1 07/14/2011 1403   BASOSABS 0.0 07/14/2011 1403    BMET    Component Value Date/Time   NA 133* 07/15/2012 0605   K 4.5 07/15/2012 0605   CL 97 07/15/2012 0605   CO2 20 07/15/2012 0605   GLUCOSE 228* 07/15/2012 0605   BUN 10 07/15/2012 0605   CREATININE 0.75 07/15/2012 0605   CREATININE 0.66 06/07/2011 1645   CALCIUM 8.5 07/15/2012 0605   GFRNONAA >90 07/15/2012 0605   GFRAA >90 07/15/2012 0605    INR    Component Value Date/Time   INR 0.92 07/13/2012 1440     Intake/Output Summary (Last 24 hours) at 07/18/12 1012 Last data filed at 07/17/12 2005  Gross per 24 hour  Intake      0 ml  Output   1350 ml  Net  -1350 ml     Assessment/Plan:  72 y.o. male is s/p:  1. Right femoral to below knee popliteal bypass with non-reversed contralateral great saphenous vein  2. 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  Started on Flomax yesterday-will give this a day or so to circulate and will try to discontinue foley again in a day or two. -DVT prophylaxis:  lovenox -continue to mobilize and OOB to chair tid with meals. -duplex ordered of bypass graft this  am-graft is patent   Doreatha Massed, PA-C [facility] [phone] 07/18/2012 10:12 AM

## 2012-07-19 LAB — GLUCOSE, CAPILLARY
Glucose-Capillary: 128 mg/dL — ABNORMAL HIGH (ref 70–99)
Glucose-Capillary: 131 mg/dL — ABNORMAL HIGH (ref 70–99)

## 2012-07-19 NOTE — Progress Notes (Signed)
Foley d/c'd per order; 800 ml urine emptied form BSD. Patient instructed to notify RN of first void. Right foot dressing changed per order; site unremarkable. Call bell, phone and urinal near. Will monitor. Daniel Tyler

## 2012-07-19 NOTE — Progress Notes (Signed)
Occupational Therapy Treatment Patient Details Name: Daniel Tyler MRN: [id number] DOB: 09-Nov-1940 Today's Date: 07/19/2012 Time: 1191-4782 OT Time Calculation (min): 39 min  OT Assessment / Plan / Recommendation Comments on Treatment Session  Pt making slow progress. Appears self limiting during sessions. Will need SNF before going home. Pt will benefit from skilled OT services to facilitate D/C to next venue due to below deficits.    Follow Up Recommendations  SNF    Barriers to Discharge       Equipment Recommendations  Tub/shower seat    Recommendations for Other Services    Frequency Min 2X/week   Plan Discharge plan remains appropriate    Precautions / Restrictions Precautions Precautions: Fall Precaution Comments: R fourth metatarsal amputation;fem pop bypass   Pertinent Vitals/Pain C/o pain RLE. C/o nausea. nsg aware    ADL  Grooming: Min guard Where Assessed - Grooming: Supported standing Lower Body Dressing: Maximal assistance Where Assessed - Lower Body Dressing: Supported sit to stand Toilet Transfer: Minimal assistance Toilet Transfer Method: Other (comment) (ambulaitng) Toilet Transfer Equipment: Bedside commode;Other (comment) (over toilet) Toileting - Clothing Manipulation and Hygiene: Maximal assistance Where Assessed - Toileting Clothing Manipulation and Hygiene: Sit to stand from 3-in-1 or toilet Equipment Used: Rolling walker;Gait belt;Other (comment) Transfers/Ambulation Related to ADLs: min A ADL Comments: A with all LB ADL. Max encouragement    OT Diagnosis:    OT Problem List:   OT Treatment Interventions:     OT Goals Acute Rehab OT Goals OT Goal Formulation: With patient Time For Goal Achievement: 07/22/12 Potential to Achieve Goals: Good ADL Goals Pt Will Perform Grooming: with modified independence;Standing at sink ADL Goal: Grooming - Progress: Progressing toward goals Pt Will Perform Lower Body Bathing: Sit to stand from chair;Sit  to stand from bed;with adaptive equipment;with min assist ADL Goal: Lower Body Bathing - Progress: Revised due to lack of progress Pt Will Perform Lower Body Dressing: with min assist;Sit to stand from chair;Supported;with adaptive equipment;with cueing (comment type and amount) ADL Goal: Lower Body Dressing - Progress: Revised due to lack of progress Pt Will Transfer to Toilet: with supervision;Ambulation;with DME;3-in-1;Maintaining weight bearing status ADL Goal: Toilet Transfer - Progress: Revised due to lack of progress Pt Will Perform Tub/Shower Transfer: Tub transfer;with modified independence;Ambulation;with DME;Shower seat with back ADL Goal: Web designer - Progress: Discontinued (comment) (will address at SNF) Miscellaneous OT Goals Miscellaneous OT Goal #1: Pt will retrieve ADL items at mod I level with safe use of DME.  OT Goal: Miscellaneous Goal #1 - Progress: Discontinued (comment)  Visit Information  Last OT Received On: 07/19/12 Assistance Needed: +1    Subjective Data   "I want to lie back down."   Prior Functioning       Cognition  Cognition Arousal/Alertness: Awake/alert Behavior During Therapy: WFL for tasks assessed/performed Overall Cognitive Status: Impaired/Different from baseline Area of Impairment: Safety/judgement;Awareness;Problem solving;Memory Memory: Decreased recall of precautions Safety/Judgement: Decreased awareness of safety;Decreased awareness of deficits Awareness: Emergent Problem Solving: Slow processing General Comments: ? baseline    Mobility  Bed Mobility Bed Mobility: Supine to Sit;Sitting - Scoot to Edge of Bed Supine to Sit: 5: Supervision;HOB elevated Sitting - Scoot to Edge of Bed: 5: Supervision;With rail Details for Bed Mobility Assistance: increased time today Transfers Transfers: Sit to Stand;Stand to Sit Sit to Stand: 4: Min assist;With upper extremity assist;From bed Stand to Sit: 4: Min assist;With upper extremity  assist;To chair/3-in-1 Details for Transfer Assistance: tactile cues for mobility; Max vc  Exercises      Balance High Level Balance High Level Balance Activites: Other (comment) (Pt unable to let go of RW to complete hygiene after toiletin)   End of Session OT - End of Session Equipment Utilized During Treatment: Gait belt Activity Tolerance: Patient limited by fatigue Patient left: in chair;with call bell/phone within reach;with nursing in room Nurse Communication: Mobility status  GO     Alison Kubicki,HILLARY 07/19/2012, 2:09 PM [facility], OTR/L  [phone] 07/19/2012

## 2012-07-19 NOTE — Progress Notes (Signed)
[facility] Progress Note  07/19/2012 8:18 AM 5 Days Post-Op  Subjective:  No complaints; states he walked to the door and back.  Afebrile x 24 hrs VSS 97% RA  Filed Vitals:   07/19/12 0350  BP: 121/74  Pulse: 81  Temp: 98.8 F (37.1 C)  Resp: 19    Physical Exam: Incisions:  All incisions are healing nicely.  Bilateral groins are soft and still with some ecchymosis. Extremities:  Toe amp site healing.  CBC    Component Value Date/Time   WBC 10.1 07/15/2012 0605   RBC 2.98* 07/15/2012 0605   HGB 8.6* 07/15/2012 0605   HCT 25.8* 07/15/2012 0605   PLT 297 07/15/2012 0605   MCV 86.6 07/15/2012 0605   MCH 28.9 07/15/2012 0605   MCHC 33.3 07/15/2012 0605   RDW 14.9 07/15/2012 0605   LYMPHSABS 1.7 07/14/2011 1403   MONOABS 1.4* 07/14/2011 1403   EOSABS 0.1 07/14/2011 1403   BASOSABS 0.0 07/14/2011 1403    BMET    Component Value Date/Time   NA 133* 07/15/2012 0605   K 4.5 07/15/2012 0605   CL 97 07/15/2012 0605   CO2 20 07/15/2012 0605   GLUCOSE 228* 07/15/2012 0605   BUN 10 07/15/2012 0605   CREATININE 0.75 07/15/2012 0605   CREATININE 0.66 06/07/2011 1645   CALCIUM 8.5 07/15/2012 0605   GFRNONAA >90 07/15/2012 0605   GFRAA >90 07/15/2012 0605    INR    Component Value Date/Time   INR 0.92 07/13/2012 1440     Intake/Output Summary (Last 24 hours) at 07/19/12 0818 Last data filed at 07/19/12 0100  Gross per 24 hour  Intake      0 ml  Output   1800 ml  Net  -1800 ml     Assessment/Plan:  72 y.o. male is s/p:  1. Right femoral to below knee popliteal bypass with non-reversed contralateral great saphenous vein  2. 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 -DVT prophylaxis: lovenox -continue to mobilize more today -will check labs in am.   Doreatha Massed, PA-C [facility] [phone] 07/19/2012 8:18 AM

## 2012-07-19 NOTE — Progress Notes (Signed)
Patient voided 100 cc clear yellow urine s/p foley removal. Assisted patient back to bed. Call bell near. Mamie Levers

## 2012-07-19 NOTE — Clinical Social Work Note (Signed)
CSW met with pt at bedside.  Pt was alert and oriented.  CSW reviewed bed offers with pt.  Pt was not interested in SNF.  Pt states, "I don't want to go to any of them."  CSW made [facility] aware.  Pt was agreeable for CSW to leave bed offers at bedside.  CSW will continue to follow.  Vickii Penna, LCSWA [phone]  Clinical Social Work

## 2012-07-19 NOTE — Progress Notes (Signed)
PATIENT P.O.A.  IS Daniel Tyler . HE WOULD LIKE TO DISCUSS PLAN OF CARE WITH [name] AND SOCIAL WORKER. HE CAN BE REACH AT [phone]

## 2012-07-19 NOTE — Progress Notes (Signed)
I agree with the above Will attempt removing his Foley again. Continue with ambulation Recheck blood work in the morning  [employer]

## 2012-07-19 NOTE — Clinical Social Work Note (Signed)
MD - FL2 on chart, please sign.  Thanks!  Vickii Penna, LCSWA [phone]  Clinical Social Work

## 2012-07-19 NOTE — Progress Notes (Signed)
Physical Therapy Treatment Patient Details Name: Daniel Tyler MRN: [id number] DOB: 10/27/1940 Today's Date: 07/19/2012 Time: 1478-2956 PT Time Calculation (min): 39 min  PT Assessment / Plan / Recommendation Comments on Treatment Session  Pt continues to demonstrate significant deficits in mobility secondary to pain, deconditioning, and self limiting behaviors. Pt is very unsafe and requires increase assist for safety.  Additionally, patient non-receptive to verbal cues.  Feel patient will continue to benefit from skilled Pt to address deficits and maximize functional mobility. Will continue to provide encouragement for patient participation.    Follow Up Recommendations  SNF;Supervision/Assistance - 24 hour (Pt does not want SNF.)     Does the patient have the potential to tolerate intense rehabilitation     Barriers to Discharge        Equipment Recommendations  None recommended by PT    Recommendations for Other Services    Frequency Min 3X/week   Plan Discharge plan needs to be updated    Precautions / Restrictions Precautions Precautions: Fall Precaution Comments: R fourth metatarsal amputation;fem pop bypass   Pertinent Vitals/Pain Pt complains of 4/10 low back pain today as well as bilateral leg pain R>L    Mobility  Bed Mobility Bed Mobility: Supine to Sit;Sitting - Scoot to Edge of Bed Supine to Sit: 5: Supervision;HOB elevated Sitting - Scoot to Edge of Bed: 5: Supervision;With rail Details for Bed Mobility Assistance: increased time today, patient demonstrating difficulty attending to task at hand, max cues to redirect. Transfers Transfers: Sit to Stand;Stand to Sit Sit to Stand: 4: Min assist;With upper extremity assist;From bed Stand to Sit: 4: Min assist;With upper extremity assist;To chair/3-in-1 Details for Transfer Assistance: tactile cues for mobility; Max vc Ambulation/Gait Ambulation/Gait Assistance: 3: Mod assist Ambulation Distance (Feet): 28  Feet Assistive device: Rolling walker Ambulation/Gait Assistance Details: Pt requires MAX encouragement and cues, atient stopping and resting with every 2 steps. Patient requires tactile cues and assist for control of rolling walker. Max vcs for step length and positioning within RW. Patient very sel-limiting Gait Pattern: Step-to pattern;Decreased stance time - right;Decreased step length - right;Decreased step length - left;Trunk flexed Gait velocity: decr (significantly) General Gait Details: Limited by pain; attempting to abandon rw and reach across room for bed to sit down when not in close range to do so.Marland Kitchen    Exercises     PT Diagnosis:    PT Problem List:   PT Treatment Interventions:     PT Goals Acute Rehab PT Goals PT Goal Formulation: With patient Time For Goal Achievement: 07/22/12 Potential to Achieve Goals: Good Pt will go Supine/Side to Sit: with modified independence PT Goal: Supine/Side to Sit - Progress: Met Pt will go Sit to Supine/Side: with modified independence Pt will go Sit to Stand: with modified independence PT Goal: Sit to Stand - Progress: Progressing toward goal Pt will go Stand to Sit: with modified independence PT Goal: Stand to Sit - Progress: Progressing toward goal Pt will Ambulate: 51 - 150 feet;with supervision PT Goal: Ambulate - Progress: Progressing toward goal Pt will Go Up / Down Stairs: 1-2 stairs;with min assist;with least restrictive assistive device  Visit Information  Last PT Received On: 07/19/12 Assistance Needed: +1    Subjective Data  Subjective: "i don't think I can do anything" Patient Stated Goal: go home   Cognition  Cognition Arousal/Alertness: Awake/alert Behavior During Therapy: WFL for tasks assessed/performed Overall Cognitive Status: Impaired/Different from baseline Area of Impairment: Safety/judgement;Awareness;Problem solving;Memory Memory: Decreased recall of precautions  Safety/Judgement: Decreased awareness of  safety;Decreased awareness of deficits Awareness: Emergent Problem Solving: Slow processing General Comments: ? baseline    Balance  Balance Balance Assessed: Yes Static Standing Balance Static Standing - Balance Support: Left upper extremity supported;During functional activity Static Standing - Level of Assistance: 4: Min assist Static Standing - Comment/# of Minutes: Patient continues to demonstrate unsafe behaviors trying to sit during standing rest breaks without a chair to bed to sit on.  High Level Balance High Level Balance Activites: Other (comment) (Pt unable to let go of RW to complete hygiene after toiletin)  End of Session PT - End of Session Equipment Utilized During Treatment: Gait belt Activity Tolerance: Patient limited by pain Patient left: in chair;with call bell/phone within reach Nurse Communication: Mobility status   GP     Fabio Asa 07/19/2012, 2:20 PM Charlotte Crumb, PT DPT  [phone]

## 2012-07-20 LAB — CBC
Hemoglobin: 8.4 g/dL — ABNORMAL LOW (ref 13.0–17.0)
MCHC: 33.9 g/dL (ref 30.0–36.0)
RDW: 14.7 % (ref 11.5–15.5)
WBC: 9.6 10*3/uL (ref 4.0–10.5)

## 2012-07-20 LAB — BASIC METABOLIC PANEL
Chloride: 92 mEq/L — ABNORMAL LOW (ref 96–112)
GFR calc Af Amer: >90 mL/min (ref >90)
GFR calc non Af Amer: 90 mL/min — ABNORMAL LOW (ref >90)
Potassium: 4 mEq/L (ref 3.5–5.1)
Sodium: 130 mEq/L — ABNORMAL LOW (ref 135–145)

## 2012-07-20 LAB — GLUCOSE, CAPILLARY: Glucose-Capillary: 156 mg/dL — ABNORMAL HIGH (ref 70–99)

## 2012-07-20 MED ORDER — OXYCODONE HCL 5 MG PO TABS: 5.0000 mg | ORAL_TABLET | ORAL | Status: DC | PRN

## 2012-07-20 NOTE — Progress Notes (Signed)
Awaiting transport to [facility] via [facility]. Report called to [facility]. IV d/c'd with catheter intact. Patient assisted in dressing. Mamie Levers

## 2012-07-20 NOTE — Clinical Social Work Note (Signed)
Clinical Social Worker facilitated patient discharge including contacting patient, patient HCPOA and facility to confirm patient discharge plans.  Clinical information faxed to facility and family agreeable with plan.  CSW arranged ambulance transport via [facility] to [place] .  RN to call report prior to discharge.  Clinical Social Worker will sign off for now as social work intervention is no longer needed. Please consult [place] again if new need arises.  Macario Golds, [place] [phone]

## 2012-07-20 NOTE — Progress Notes (Signed)
Patient transported via [facility] to [facility]. Belongings with patient. Daniel Tyler

## 2012-07-20 NOTE — Progress Notes (Signed)
[facility] [place]  Subjective  - Still with some pain at toe amp site   Objective 126/75 80 98.8 F (37.1 C) (Oral) 18 95%  Intake/Output Summary (Last 24 hours) at 07/20/12 0755 Last data filed at 07/20/12 0700  Gross per 24 hour  Intake   1140 ml  Output    600 ml  Net    540 ml   Right and left leg incisions healing Right toe amp site slightly dessicated but overall healing Brisk biphasic peroneal and PT doppler, faint monophasic DP  Assessment/Planning: POD #6 Right fem below knee pop.  May have some vein graft stenosis by duplex will observe for now PT/OT pt would prefer Rehab here Will need SNF if not a Rehab candidate    Daniel Tyler,Daniel Tyler 07/20/2012 7:55 AM --  Laboratory Lab Results:  Recent Labs  07/20/12 0522  WBC 9.6  HGB 8.4*  HCT 24.8*  PLT 406*   BMET  Recent Labs  07/20/12 0522  NA 130*  K 4.0  CL 92*  CO2 25  GLUCOSE 156*  BUN 20  CREATININE 0.76  CALCIUM 9.2    COAG Lab Results  Component Value Date   INR 0.92 07/13/2012   INR 1.42 07/01/2011   INR 1.00 06/27/2011   No results found for this basename: PTT

## 2012-07-20 NOTE — Progress Notes (Signed)
Occupational Therapy Treatment Patient Details Name: Daniel Tyler MRN: [id number] DOB: [birth year] Today's Date: 07/20/2012 Time: 4098-1191 OT Time Calculation (min): 26 min  OT Assessment / Plan / Recommendation Comments on Treatment Session Pt with improvement noted today. Increasing independence with mobility and ADL; however, pt unsafe and requires max vc. Pt needs SNF for rehab. Pt will benefit from skilled OT services to facilitate D/C to next venue due to below deficits.    Follow Up Recommendations  SNF    Barriers to Discharge       Equipment Recommendations  Tub/shower seat    Recommendations for Other Services    Frequency Min 2X/week   Plan Discharge plan remains appropriate    Precautions / Restrictions Precautions Precautions: Fall Restrictions Weight Bearing Restrictions: No   Pertinent Vitals/Pain C/o general pain. No request for pain meds    ADL  Grooming: Supervision/safety;Set up Where Assessed - Grooming: Supported standing Toilet Transfer: Hydrographic surveyor Method: Other (comment) (ambulatin to stand to urinate) Toileting - Architect and Hygiene: Supervision/safety Where Assessed - Toileting Clothing Manipulation and Hygiene: Standing Equipment Used: Gait belt;Rolling walker Transfers/Ambulation Related to ADLs:  minguard RW level ADL Comments: Improving with ADL ability. improved endurance,. improved participation    OT Diagnosis:    OT Problem List:   OT Treatment Interventions:     OT Goals Acute Rehab OT Goals OT Goal Formulation: With patient Time For Goal Achievement: 07/29/12 ADL Goals Pt Will Perform Grooming: with modified independence;Standing at sink ADL Goal: Grooming - Progress: Progressing toward goals Pt Will Perform Lower Body Bathing: Sit to stand from chair;Sit to stand from bed;with adaptive equipment;with min assist ADL Goal: Lower Body Bathing - Progress: Progressing toward goals Pt Will Perform Lower  Body Dressing: with min assist;Sit to stand from chair;Supported;with adaptive equipment;with cueing (comment type and amount) ADL Goal: Lower Body Dressing - Progress: Progressing toward goals Pt Will Transfer to Toilet: with supervision;Ambulation;with DME;3-in-1;Maintaining weight bearing status ADL Goal: Toilet Transfer - Progress: Progressing toward goals Pt Will Perform Tub/Shower Transfer: Tub transfer;with modified independence;Ambulation;with DME;Shower seat with back ADL Goal: Web designer - Progress: Progressing toward goals Miscellaneous OT Goals Miscellaneous OT Goal #1: Pt will retrieve ADL items at mod I level with safe use of DME.  OT Goal: Miscellaneous Goal #1 - Progress: Progressing toward goals  Visit Information  Last OT Received On: 07/20/12 Assistance Needed: +1 PT/OT Co-Evaluation/Treatment: Yes    Subjective Data      Prior Functioning       Cognition  Cognition Arousal/Alertness: Awake/alert Behavior During Therapy: WFL for tasks assessed/performed Overall Cognitive Status: Impaired/Different from baseline Area of Impairment: Safety/judgement;Awareness;Problem solving;Memory Memory: Decreased recall of precautions Safety/Judgement: Decreased awareness of safety;Decreased awareness of deficits Awareness: Emergent Problem Solving: Slow processing;Requires verbal cues;Difficulty sequencing General Comments: ? baseline    Mobility  Bed Mobility Bed Mobility: Supine to Sit;Sitting - Scoot to Edge of Bed Supine to Sit: 6: Modified independent (Device/Increase time) Sitting - Scoot to Edge of Bed: 6: Modified independent (Device/Increase time) Transfers Transfers: Sit to Stand;Stand to Sit Sit to Stand: 4: Min guard;With upper extremity assist;From bed Stand to Sit: 4: Min assist;With upper extremity assist;To chair/3-in-1 Details for Transfer Assistance: max vc for correct sequence and tactile cues for safety. Trying to unsafely sit without chair  behind him    Exercises  General Exercises - Upper Extremity Shoulder Flexion: Theraband;Both;10 reps Theraband Level (Shoulder Flexion): Level 1 (Yellow) Shoulder Extension: Strengthening;Both;5 reps;Seated;Theraband Theraband Level (Shoulder  Extension): Level 1 (Yellow) Shoulder ABduction: Strengthening;Both;5 reps;Seated;Theraband Theraband Level (Shoulder Abduction): Level 1 (Yellow) Elbow Flexion: Strengthening;Both;5 reps;Seated;Theraband Theraband Level (Elbow Flexion): Level 1 (Yellow) Elbow Extension: Strengthening;Both;5 reps;Seated;Theraband Theraband Level (Elbow Extension): Level 1 (Yellow)   Balance Static Standing Balance Static Standing - Balance Support: No upper extremity supported;During functional activity Static Standing - Level of Assistance: 4: Min assist   End of Session OT - End of Session Equipment Utilized During Treatment: Gait belt Activity Tolerance: Patient limited by fatigue Patient left: in chair;with call bell/phone within reach;with nursing in room Nurse Communication: Mobility status  GO     Daniel Tyler,Daniel Tyler 07/20/2012, 11:39 AM Luisa Dago, OTR/L  [phone] 07/20/2012

## 2012-07-20 NOTE — Progress Notes (Signed)
Physical Therapy Treatment Patient Details Name: Daniel Tyler MRN: [id number] DOB: 09/12/1940 Today's Date: 07/20/2012 Time: 5784-6962 PT Time Calculation (min): 24 min  PT Assessment / Plan / Recommendation Comments on Treatment Session  Pt with improvements in activity tolerance today but continues to require increased encouragement to participate. Patient ambulated increased distance. Will continue to work with patient and progress activity as tolerated.    Follow Up Recommendations  SNF;Supervision/Assistance - 24 hour (Pt does not want SNF.)     Does the patient have the potential to tolerate intense rehabilitation     Barriers to Discharge        Equipment Recommendations  None recommended by PT    Recommendations for Other Services    Frequency Min 3X/week   Plan Discharge plan needs to be updated    Precautions / Restrictions Precautions Precautions: Fall Restrictions Weight Bearing Restrictions: No   Pertinent Vitals/Pain 6/10    Mobility  Bed Mobility Bed Mobility: Supine to Sit;Sitting - Scoot to Edge of Bed Supine to Sit: 6: Modified independent (Device/Increase time) Sitting - Scoot to Edge of Bed: 6: Modified independent (Device/Increase time) Transfers Transfers: Sit to Stand;Stand to Sit Sit to Stand: 4: Min guard;With upper extremity assist;From bed Stand to Sit: 4: Min assist;With upper extremity assist;To chair/3-in-1 Details for Transfer Assistance: max vc for correct sequence and tactile cues for safety. Trying to unsafely sit without chair behind him Ambulation/Gait Ambulation/Gait Assistance: 4: Min assist Ambulation Distance (Feet): 90 Feet Assistive device: Rolling walker Ambulation/Gait Assistance Details: Pt continues to require max cues for encouragement as well as verbal and tactile cues for sequencing and proper use of RW Gait Pattern: Step-to pattern;Decreased stance time - right;Decreased step length - right;Decreased step length -  left;Trunk flexed Gait velocity: decr (significantly) General Gait Details: Limited by pain; attempting to abandon rw and reach across room for bed to sit down when not in close range to do so.Marland Kitchen    Exercises General Exercises - Upper Extremity Shoulder Flexion: Theraband;Both;10 reps Theraband Level (Shoulder Flexion): Level 1 (Yellow) Shoulder Extension: Strengthening;Both;5 reps;Seated;Theraband Theraband Level (Shoulder Extension): Level 1 (Yellow) Shoulder ABduction: Strengthening;Both;5 reps;Seated;Theraband Theraband Level (Shoulder Abduction): Level 1 (Yellow) Elbow Flexion: Strengthening;Both;5 reps;Seated;Theraband Theraband Level (Elbow Flexion): Level 1 (Yellow) Elbow Extension: Strengthening;Both;5 reps;Seated;Theraband Theraband Level (Elbow Extension): Level 1 (Yellow)     PT Goals Acute Rehab PT Goals PT Goal Formulation: With patient Time For Goal Achievement: 07/22/12 Potential to Achieve Goals: Good Pt will go Supine/Side to Sit: with modified independence Pt will go Sit to Supine/Side: with modified independence Pt will go Sit to Stand: with modified independence PT Goal: Sit to Stand - Progress: Progressing toward goal Pt will go Stand to Sit: with modified independence PT Goal: Stand to Sit - Progress: Progressing toward goal Pt will Ambulate: 51 - 150 feet;with supervision PT Goal: Ambulate - Progress: Progressing toward goal Pt will Go Up / Down Stairs: 1-2 stairs;with min assist;with least restrictive assistive device  Visit Information  Last PT Received On: 07/20/12 Assistance Needed: +1    Subjective Data  Subjective: I can't do much Patient Stated Goal: to go home   Cognition  Cognition Arousal/Alertness: Awake/alert Behavior During Therapy: WFL for tasks assessed/performed Overall Cognitive Status: Impaired/Different from baseline Area of Impairment: Safety/judgement;Awareness;Problem solving;Memory Memory: Decreased recall of  precautions Safety/Judgement: Decreased awareness of safety;Decreased awareness of deficits Awareness: Emergent Problem Solving: Slow processing;Requires verbal cues;Difficulty sequencing General Comments: ? baseline    Balance  Balance Balance Assessed: Yes  Static Standing Balance Static Standing - Balance Support: No upper extremity supported;During functional activity Static Standing - Level of Assistance: 4: Min assist Static Standing - Comment/# of Minutes: 2 minutes and 1 minute  End of Session PT - End of Session Equipment Utilized During Treatment: Gait belt Activity Tolerance: Patient limited by pain Patient left: in chair;with call bell/phone within reach Nurse Communication: Mobility status   GP     Fabio Asa 07/20/2012, 12:46 PM Charlotte Crumb, PT DPT  [phone]

## 2012-07-20 NOTE — Progress Notes (Signed)
[facility] Discharge Summary   Patient ID:  Daniel Tyler MRN: [id number] DOB/AGE: August 12, 1940 72 y.o.  Admit date: 07/14/2012 Discharge date: 07/20/2012 Date of Surgery: 07/14/2012 Surgeon: Surgeon(s): Sherren Kerns, MD  Admission Diagnosis: Peripheral Vascular Disease nonhealing wound right foot  Discharge Diagnoses:  Peripheral Vascular Disease nonhealing wound right foot  Secondary Diagnoses: Past Medical History  Diagnosis Date  . Enlarged heart   . Hyperlipidemia   . Carotid stenosis   . Coronary artery disease   . Pneumonia 2013    hx of  . Anemia   . Diabetes mellitus     takes Glipizide and Metformin daily;Lantus nightly  . Myocardial infarction 2013  . Weakness of right leg   . Skin spots-aging   . Depression     bc of foot pain but no meds required    Procedure(s): BYPASS GRAFT FEMORAL-POPLITEAL ARTERY VEIN HARVEST AMPUTATION DIGIT INTRA OPERATIVE ARTERIOGRAM  Discharged Condition: good  HPI: Patient is a 72 y.o. year old male who presents for evaluation of a nonhealing wound on his right fourth toe. The patient is referred by Dr. Elvin So. The patient states the wound on his toe has been present for a few months and slowly getting worse. He denies any claudication symptoms. He does complain of his feet hurt at night time. Other medical problems include hyperlipidemia, hypertension, diabetes, coronary disease. I previously did a right carotid endarterectomy in am and May of 2013 and recent duplex scan showed no recurrent stenosis. He is a former smoker but quit many years ago.  On 07/09/2012 an aortogram was performed showing #1 chronic bilateral superficial femoral artery occlusions #2 one-vessel runoff bilateral via peroneal.  He was brought to the ED with left groin bleeding and non healing right 4th toe ulcer.  On 07-14-2012 Dr. 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  ABI: Right 0.66; Left 0.48.  He was on Lovenox prophylaxis and given Flomax for difficulty with voiding.  He is being discharged today to SNF for improvement of independence and mobility.     Hospital Course:  Daniel Tyler is a 72 y.o. male is S/P Right Procedure(s): BYPASS GRAFT FEMORAL-POPLITEAL ARTERY VEIN HARVEST AMPUTATION DIGIT INTRA OPERATIVE ARTERIOGRAM Extubated: POD # 0 Physical exam: Right and left leg incisions healing  Right toe amp site slightly dessicated but overall healing  Brisk biphasic peroneal and PT doppler, faint monophasic DP Post-op wounds clean, dry, intact or healing well Pt. Ambulating, voiding and taking PO diet without difficulty. Pt pain controlled with PO pain meds. Labs as below Complications:difficulty with mobility and independent voiding.  Consults:     Significant Diagnostic Studies: CBC Lab Results  Component Value Date   WBC 9.6 07/20/2012   HGB 8.4* 07/20/2012   HCT 24.8* 07/20/2012   MCV 86.1 07/20/2012   PLT 406* 07/20/2012    BMET    Component Value Date/Time   NA 130* 07/20/2012 0522   K 4.0 07/20/2012 0522   CL 92* 07/20/2012 0522   CO2 25 07/20/2012 0522   GLUCOSE 156* 07/20/2012 0522   BUN 20 07/20/2012 0522   CREATININE 0.76 07/20/2012 0522   CREATININE 0.66 06/07/2011 1645   CALCIUM 9.2 07/20/2012 0522   GFRNONAA 90* 07/20/2012 0522   GFRAA >90 07/20/2012 0522  COAG Lab Results  Component Value Date   INR 0.92 07/13/2012   INR 1.42 07/01/2011   INR 1.00 06/27/2011       Discharge Orders   Future Appointments Provider Department Dept Phone   07/29/2012 9:15 AM Sherren Kerns, MD [facility] [phone]   08/19/2012 2:30 PM [facility] [place] [phone]   08/19/2012 3:40 PM Evern Bio, NP  [facility] [phone]   Future Orders Complete By Expires     Call MD for:  redness, tenderness, or signs of infection (pain, swelling, bleeding, redness, odor or green/yellow discharge around incision site)  As directed     Call MD for:  severe or increased pain, loss or decreased feeling  in affected limb(s)  As directed     Call MD for:  temperature >100.5  As directed     Discharge wound care:  As directed     Comments:      Wash the groin wound with soap and water and pat dry.  Then put a dry gauze or washcloth there to keep this area dry.  Do not use Vaseline or neosporin on your incisions.  Only use soap and water on your incisions and then protect and keep dry.    Driving Restrictions  As directed     Comments:      No driving for 2 weeks    Lifting restrictions  As directed     Comments:      No lifting for 6 weeks    Resume previous diet  As directed         Medication List    TAKE these medications       albuterol 108 (90 BASE) MCG/ACT inhaler  Commonly known as:  PROVENTIL HFA;VENTOLIN HFA  Inhale 2 puffs into the lungs every 6 (six) hours as needed. For wheezing or shortness of breath     ALPRAZolam 0.25 MG tablet  Commonly known as:  XANAX  Take 0.25 mg by mouth at bedtime. For sleep     furosemide 40 MG tablet  Commonly known as:  LASIX  Take 40 mg by mouth daily.     glipiZIDE 5 MG tablet  Commonly known as:  GLUCOTROL  Take 5 mg by mouth 2 (two) times daily before a meal.     insulin glargine 100 UNIT/ML injection  Commonly known as:  LANTUS  Inject 17 Units into the skin at bedtime.     metFORMIN 1000 MG tablet  Commonly known as:  GLUCOPHAGE  Take 1,000 mg by mouth 2 (two) times daily with a meal.     metoprolol tartrate 25 MG tablet  Commonly known as:  LOPRESSOR  Take 25 mg by mouth 2 (two) times daily.       Verbal and written Discharge instructions given to the patient. Wound care per Discharge AVS      Follow-up Information   Follow up with Sherren Kerns, MD In 2 weeks. (Office will call you to arrange your appts (sent))    Contact information:   [address] [place] [address] [phone]     Disposition:  Discharge to :SNF- For VQI Registry use --- Instructions: Press F2 to tab through selections.  Delete question if not applicable.   Post-op:  Wound infection: No  Graft infection: No  Transfusion: No  If yes, 0 units given New Arrhythmia: No Ipsilateral amputation: [x ] no, [ ]   Minor, [ ]  BKA, [ ]  AKA Discharge patency: [ ]  Primary, [ ]  Primary assisted, [ ]  Secondary, [ ]  Occluded Patency judged by: [x ] Dopper only, [ ]  Palpable graft pulse, [ ]  Palpable distal pulse, [ ]  ABI inc. > 0.15, [ ]  Duplex Discharge ABI: R 0.66, L 0.40 D/C Ambulatory Status: Ambulatory with Assistance  Complications: MI: [x ] No, [ ]  Troponin only, [ ]  EKG or Clinical CHF: No Resp failure: [x ] none, [ ]  Pneumonia, [ ]  Ventilator Chg in renal function: [x ] none, [ ]  Inc. Cr > 0.5, [ ]  Temp. Dialysis, [ ]  Permanent dialysis Stroke: [x ] None, [ ]  Minor, [ ]  Major Return to OR: No  Reason for return to OR: [ ]  Bleeding, [ ]  Infection, [ ]  Thrombosis, [ ]  Revision  Discharge medications: Statin use:  No  for medical reason   ASA use:  No  for medical reason   Plavix use:  No  for medical reason   Beta blocker use: Yes Coumadin use: No  for medical reason       SignedMosetta Pigeon 07/20/2012, 1:58 PM

## 2012-07-20 NOTE — Progress Notes (Signed)
Voided 500 cc urine. Assisted back to bed. Call bell near. Bed alarm in place.Daniel Tyler

## 2012-07-21 ENCOUNTER — Other Ambulatory Visit: Payer: Self-pay | Admitting: Geriatric Medicine

## 2012-07-21 MED ORDER — ALPRAZOLAM 0.25 MG PO TABS: 0.2500 mg | ORAL_TABLET | Freq: Every day | ORAL | Status: DC

## 2012-07-21 NOTE — Care Management Note (Signed)
    Page 1 of 1   07/21/2012     1:34:39 PM   CARE MANAGEMENT NOTE 07/21/2012  Patient:  Daniel Tyler, Daniel Tyler   Account Number:  [id number]  Date Initiated:  07/15/2012  Documentation initiated by:  Donn Pierini  Subjective/Objective Assessment:   Pt admitted s/p  fem-pop bypass graft     Action/Plan:   PTA pt lived at home alone, PT/OT evals pending- NCM to follow for d/c recommendations and planning- pt wants to return home   Anticipated DC Date:  07/19/2012   Anticipated DC Plan:  HOME W HOME HEALTH SERVICES  In-house referral  Clinical Social Worker      DC Planning Services  CM consult      Choice offered to / List presented to:             Status of service:  Completed, signed off Medicare Important Message given?   (If response is "NO", the following Medicare IM given date fields will be blank) Date Medicare IM given:   Date Additional Medicare IM given:    Discharge Disposition:    Per UR Regulation:  Reviewed for med. necessity/level of care/duration of stay  If discussed at Long Length of Stay Meetings, dates discussed:    Comments:  07/20/12 Rosalita Chessman [phone] PT/OT RECOMMENDATIONS FOR SNF AT DC.  INITIALLY PT REFUSED SNF PLACEMENT, BUT NOW HAS RECONSIDERED.  CSW FOLLOWING TO FACILITATE DC TO SNF.

## 2012-07-22 ENCOUNTER — Encounter: Payer: Self-pay | Admitting: Nurse Practitioner

## 2012-07-22 ENCOUNTER — Non-Acute Institutional Stay (SKILLED_NURSING_FACILITY): Payer: Medicare Other | Admitting: Nurse Practitioner

## 2012-07-22 DIAGNOSIS — E1159 Type 2 diabetes mellitus with other circulatory complications: Secondary | ICD-10-CM

## 2012-07-22 DIAGNOSIS — I739 Peripheral vascular disease, unspecified: Secondary | ICD-10-CM

## 2012-07-22 DIAGNOSIS — J449 Chronic obstructive pulmonary disease, unspecified: Secondary | ICD-10-CM

## 2012-07-22 DIAGNOSIS — L98499 Non-pressure chronic ulcer of skin of other sites with unspecified severity: Secondary | ICD-10-CM

## 2012-07-22 DIAGNOSIS — D62 Acute posthemorrhagic anemia: Secondary | ICD-10-CM

## 2012-07-22 DIAGNOSIS — I10 Essential (primary) hypertension: Secondary | ICD-10-CM

## 2012-07-22 NOTE — Assessment & Plan Note (Signed)
Patients htn is stable; continue current regimen. Will monitor and make changes as necessary.

## 2012-07-22 NOTE — Assessment & Plan Note (Addendum)
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 Here at [facility] for STR to regain strength and mobility.  Has follow up scheduled for stable removal

## 2012-07-22 NOTE — Assessment & Plan Note (Addendum)
Will cont lantus, metformin, and glucotrol at this time. Will cont [facility] CBGS On 07/15/12 A1c was 7.4

## 2012-07-22 NOTE — Progress Notes (Signed)
Patient ID: Daniel Tyler, male   DOB: Jan 30, 1941, 72 y.o.   MRN: [id number]   PCP: Dois Davenport., MD  Code Status: full  No Known Allergies  Chief Complaint: follow up hospitalization  HPI:  Patient is a 72 y.o. year old male with a pmh of  hyperlipidemia, hypertension, diabetes, coronary disease was seen by vascular for evaluation of a nonhealing wound on his right fourth toe. he had no any claudication symptoms. He does complain of his feet hurt at night time. It was found that pt had osteomyelitis and was hospitalized on 07/14/12 - 07/20/12. On 07-14-2012 Dr. Darrick Penna performed Right Femoral Popliteal Bypass with Left Leg Nonreversed Greater Saphenous Vein  Pt had a  right carotid endarterectomy in May of 2013 and recent duplex scan showed no recurrent stenosis. He is a former smoker but quit many years ago.  On 07/09/2012 an aortogram was performed showing #1 chronic bilateral superficial femoral artery occlusions #2 one-vessel runoff bilateral via peroneal. He was brought to the ED with left groin bleeding and non healing right 4th toe ulcer.  VEIN HARVEST (Left) - Left Greater SaphenousVein Harvest  AMPUTATION DIGIT (Right) - Amputation Right Fourth Toe  ABI: Right 0.66; Left 0.48.  Pt had some difficulty voiding after surgery and was given Flomax. Pt currently without any difficulty voiding or dysuria. Pt is now at [facility] for short term rehab. Pt is doing well and without any complaints during today's visit.   Review of Systems:  Review of Systems  Constitutional: Negative for fever, chills and malaise/fatigue.  Respiratory: Negative for cough and shortness of breath.   Cardiovascular: Negative for chest pain, palpitations, claudication and leg swelling.  Gastrointestinal: Negative for heartburn, abdominal pain, diarrhea and constipation.  Genitourinary: Negative for dysuria, urgency and frequency.  Musculoskeletal: Negative for myalgias and joint pain.  Skin:       bruising and  staples to bilateral groins  Stables to bilateral legs Dressing to right foot  Neurological: Negative for dizziness, weakness and headaches.  Psychiatric/Behavioral: Negative for depression. The patient is not nervous/anxious and does not have insomnia.      Past Medical History  Diagnosis Date  . Enlarged heart   . Hyperlipidemia   . Carotid stenosis   . Coronary artery disease   . Pneumonia 2013    hx of  . Anemia   . Diabetes mellitus     takes Glipizide and Metformin daily;Lantus nightly  . Myocardial infarction 2013  . Weakness of right leg   . Skin spots-aging   . Depression     bc of foot pain but no meds required   Past Surgical History  Procedure Laterality Date  . Cardiac catheterization  2013  . Endarterectomy  07/01/2011    Procedure: ENDARTERECTOMY CAROTID;  Surgeon: Sherren Kerns, MD;  Location: [facility] OR;  Service: Vascular;  Laterality: Right;  . Carotid endarterectomy  07/01/11    Right CEA  . Abdominal aortagram    . Coronary artery bypass graft  07/01/2011    Procedure: CORONARY ARTERY BYPASS GRAFTING (CABG);  Surgeon: Loreli Slot, MD;  Location: [facility] OR;  Service: Open Heart Surgery;  Laterality: N/A;  . Colonoscopy    . Femoral-popliteal bypass graft Right 07/14/2012    Procedure: BYPASS GRAFT FEMORAL-POPLITEAL ARTERY;  Surgeon: Sherren Kerns, MD;  Location: [facility] OR;  Service: Vascular;  Laterality: Right;  Right Femoral Popliteal Bypass with Left Leg Nonreversed Greater Saphenous Vein  . Amputation Right 07/14/2012  Procedure: AMPUTATION DIGIT;  Surgeon: Sherren Kerns, MD;  Location: [facility] OR;  Service: Vascular;  Laterality: Right;  Amputation Right Fourth Toe  . Intraoperative arteriogram Right 07/14/2012    Procedure: INTRA OPERATIVE ARTERIOGRAM;  Surgeon: Sherren Kerns, MD;  Location: [facility] OR;  Service: Vascular;  Laterality: Right;   Social History:   reports that he quit smoking about 34 years ago. His smoking use included Cigarettes. He has a  20 pack-year smoking history. He has never used smokeless tobacco. He reports that he does not drink alcohol or use illicit drugs.  Family History  Problem Relation Age of Onset  . Heart disease Father   . Cancer Brother   . Diabetes Brother   . Diabetes Daughter   . Anesthesia problems Neg Hx   . Hypotension Neg Hx   . Malignant hyperthermia Neg Hx   . Pseudochol deficiency Neg Hx     Medications: Patient's Medications  New Prescriptions   No medications on file  Previous Medications   ALBUTEROL (PROVENTIL HFA;VENTOLIN HFA) 108 (90 BASE) MCG/ACT INHALER    Inhale 2 puffs into the lungs every 6 (six) hours as needed. For wheezing or shortness of breath   ALPRAZOLAM (XANAX) 0.25 MG TABLET    Take 1 tablet (0.25 mg total) by mouth at bedtime. For sleep   FUROSEMIDE (LASIX) 40 MG TABLET    Take 40 mg by mouth daily.   GLIPIZIDE (GLUCOTROL) 5 MG TABLET    Take 5 mg by mouth 2 (two) times daily before a meal.   INSULIN GLARGINE (LANTUS) 100 UNIT/ML INJECTION    Inject 17 Units into the skin at bedtime.    METFORMIN (GLUCOPHAGE) 1000 MG TABLET    Take 1,000 mg by mouth 2 (two) times daily with a meal.   METOPROLOL TARTRATE (LOPRESSOR) 25 MG TABLET    Take 25 mg by mouth 2 (two) times daily.   OXYCODONE (OXY IR/ROXICODONE) 5 MG IMMEDIATE RELEASE TABLET    Take 1-2 tablets (5-10 mg total) by mouth every 4 (four) hours as needed.  Modified Medications   No medications on file  Discontinued Medications   No medications on file     Physical Exam:  Filed Vitals:   07/22/12 1620  BP: 127/81  Pulse: 82  Temp: 97.7 F (36.5 C)  Resp: 18    Physical Exam  Constitutional: He is oriented to person, place, and time. He appears well-developed. No distress.  HENT:  Head: Normocephalic and atraumatic.  Eyes: EOM are normal. Pupils are equal, round, and reactive to light.  Neck: Normal range of motion. Neck supple. No thyromegaly present.  Cardiovascular: Normal rate, regular rhythm and  normal heart sounds.   Pulmonary/Chest: Breath sounds normal. No respiratory distress.  Abdominal: Soft. Bowel sounds are normal. He exhibits no distension. There is no tenderness.  Musculoskeletal: Normal range of motion. He exhibits no edema and no tenderness.  Neurological: He is alert and oriented to person, place, and time.  Skin: Skin is warm and dry. He is not diaphoretic.  Bruising to bilateral groins at incisions also with hematoma below right incision. Mild tenderness at site. No drainage or heat to site. incisions to right and left legs with staples mild irritation to all sites where staples have been inserted. No drainage, heat or increased pain  Psychiatric: He has a normal mood and affect.     Labs reviewed: Basic Metabolic Panel:  Recent Labs  16/10/96 1440 07/15/12 0605 07/20/12 0522  NA  132* 133* 130*  K 4.7 4.5 4.0  CL 98 97 92*  CO2 18* 20 25  GLUCOSE 307* 228* 156*  BUN 15 10 20   CREATININE 0.82 0.75 0.76  CALCIUM 9.2 8.5 9.2   Liver Function Tests:  Recent Labs  07/13/12 1440  AST 12  ALT 9  ALKPHOS 60  BILITOT 0.2*  PROT 7.8  ALBUMIN 3.5   No results found for this basename: LIPASE, AMYLASE,  in the last 8760 hours No results found for this basename: AMMONIA,  in the last 8760 hours CBC:  Recent Labs  07/13/12 1440 07/15/12 0605 07/20/12 0522  WBC 9.8 10.1 9.6  HGB 11.1* 8.6* 8.4*  HCT 32.9* 25.8* 24.8*  MCV 86.1 86.6 86.1  PLT 341 297 406*   Cardiac Enzymes: No results found for this basename: CKTOTAL, CKMB, CKMBINDEX, TROPONINI,  in the last 8760 hours BNP: No components found with this basename: POCBNP,  CBG:  Recent Labs  07/19/12 2105 07/20/12 0605 07/20/12 1107  GLUCAP 131* 156* 150*    Radiological Exams:  Assessment/Plan Type II or unspecified type diabetes mellitus with peripheral circulatory disorders, uncontrolled(250.72) Will cont lantus, metformin, and glucotrol at this time. Will cont ACHS CBGS On 07/15/12  A1c was 7.4  Atherosclerosis of native arteries of the extremities with ulceration(440.23) S/p BYPASS GRAFT FEMORAL-POPLITEAL ARTERY (Right) - Right Femoral Popliteal Bypass with Left Leg Nonreversed Greater Saphenous Vein, VEIN HARVEST (Left) - Left Greater SaphenousVein Harvest, AMPUTATION DIGIT (Right) - Amputation Right Fourth Toe. Here at [facility] for STR to regain strength and mobility.  Has follow up scheduled for stable removal   Anemia due to blood loss, acute Will follow up cbc  Hypertension Patients htn is stable; continue current regimen. Will monitor and make changes as necessary.   COPD (chronic obstructive pulmonary disease) Only taking as needed albuterol. Pulmonary status is stable at this time.     Labs/tests ordered  Cbc and bmp

## 2012-07-22 NOTE — Assessment & Plan Note (Signed)
Only taking as needed albuterol. Pulmonary status is stable at this time.

## 2012-07-22 NOTE — Assessment & Plan Note (Signed)
Will follow up cbc 

## 2012-07-28 ENCOUNTER — Encounter: Payer: Self-pay | Admitting: Vascular Surgery

## 2012-07-29 ENCOUNTER — Encounter: Payer: Self-pay | Admitting: Vascular Surgery

## 2012-07-29 ENCOUNTER — Other Ambulatory Visit: Payer: Self-pay | Admitting: *Deleted

## 2012-07-29 ENCOUNTER — Ambulatory Visit (INDEPENDENT_AMBULATORY_CARE_PROVIDER_SITE_OTHER): Payer: Medicare Other | Admitting: Vascular Surgery

## 2012-07-29 VITALS — BP 119/63 | HR 91 | Temp 98.8°F | Ht 69.0 in | Wt 155.0 lb

## 2012-07-29 DIAGNOSIS — I739 Peripheral vascular disease, unspecified: Secondary | ICD-10-CM

## 2012-07-29 MED ORDER — OXYCODONE HCL 5 MG PO TABS: ORAL_TABLET | ORAL | Status: DC

## 2012-07-29 NOTE — Discharge Summary (Signed)
[facility] Discharge Summary  Patient ID:  Daniel Tyler  MRN: [id number]  DOB/AGE: 72-11-1940 72 y.o.  Admit date: 07/14/2012  Discharge date: 07/20/2012  Date of Surgery: 07/14/2012  Surgeon: Surgeon(s):  Sherren Kerns, MD  Admission Diagnosis:  Peripheral Vascular Disease  nonhealing wound right foot  Discharge Diagnoses:  Peripheral Vascular Disease  nonhealing wound right foot  Secondary Diagnoses:  Past Medical History   Diagnosis  Date   .  Enlarged heart    .  Hyperlipidemia    .  Carotid stenosis    .  Coronary artery disease    .  Pneumonia  2013     hx of   .  Anemia    .  Diabetes mellitus      takes Glipizide and Metformin daily;Lantus nightly   .  Myocardial infarction  2013   .  Weakness of right leg    .  Skin spots-aging    .  Depression      bc of foot pain but no meds required    Procedure(s):  BYPASS GRAFT FEMORAL-POPLITEAL ARTERY  VEIN HARVEST  AMPUTATION DIGIT  INTRA OPERATIVE ARTERIOGRAM  Discharged Condition: good  HPI: Patient is a 72 y.o. year old male who presents for evaluation of a nonhealing wound on his right fourth toe. The patient is referred by Dr. Elvin So. The patient states the wound on his toe has been present for a few months and slowly getting worse. He denies any claudication symptoms. He does complain of his feet hurt at night time. Other medical problems include hyperlipidemia, hypertension, diabetes, coronary disease. I previously did a right carotid endarterectomy in am and May of 2013 and recent duplex scan showed no recurrent stenosis. He is a former smoker but quit many years ago.  On 07/09/2012 an aortogram was performed showing #1 chronic bilateral superficial femoral artery occlusions #2 one-vessel runoff bilateral via peroneal. He was brought to the ED with left groin bleeding and non healing right 4th toe ulcer. On 07-14-2012 Dr. Darrick Penna performedRight Femoral Popliteal Bypass with Left Leg Nonreversed Greater  Saphenous Vein  VEIN HARVEST (Left) - Left Greater SaphenousVein Harvest  AMPUTATION DIGIT (Right) - Amputation Right Fourth Toe  Post-op he was stable on day one and transferred to 2000. ABI: Right 0.66; Left 0.48. He was on Lovenox prophylaxis and given Flomax for difficulty with voiding. He is being discharged today to SNF for improvement of independence and mobility.  Hospital Course:  Daniel Tyler is a 72 y.o. male is S/P Right  Procedure(s):  BYPASS GRAFT FEMORAL-POPLITEAL ARTERY  VEIN HARVEST  AMPUTATION DIGIT  INTRA OPERATIVE ARTERIOGRAM  Extubated: POD # 0  Physical exam: Right and left leg incisions healing  Right toe amp site slightly dessicated but overall healing  Brisk biphasic peroneal and PT doppler, faint monophasic DP  Post-op wounds clean, dry, intact or healing well  Pt. Ambulating, voiding and taking PO diet without difficulty.  Pt pain controlled with PO pain meds.  Labs as below  Complications:difficulty with mobility and independent voiding.  Consults:   Significant Diagnostic Studies:  CBC  Lab Results   Component  Value  Date    WBC  9.6  07/20/2012    HGB  8.4*  07/20/2012    HCT  24.8*  07/20/2012    MCV  86.1  07/20/2012    PLT  406*  07/20/2012    BMET    Component  Value  Date/Time  NA  130*  07/20/2012 0522    K  4.0  07/20/2012 0522    CL  92*  07/20/2012 0522    CO2  25  07/20/2012 0522    GLUCOSE  156*  07/20/2012 0522    BUN  20  07/20/2012 0522    CREATININE  0.76  07/20/2012 0522    CREATININE  0.66  06/07/2011 1645    CALCIUM  9.2  07/20/2012 0522    GFRNONAA  90*  07/20/2012 0522    GFRAA  >90  07/20/2012 0522    COAG  Lab Results   Component  Value  Date    INR  0.92  07/13/2012    INR  1.42  07/01/2011    INR  1.00  06/27/2011    Discharge Orders    Future Appointments  Provider  Department  Dept Phone    07/29/2012 9:15 AM  Sherren Kerns, MD  [facility]  [phone]    08/19/2012 2:30 PM  [facility]   [facility]  [phone]    08/19/2012 3:40 PM  Evern Bio, NP  [facility]  [phone]    Future Orders  Complete By  Expires     Call MD for: redness, tenderness, or signs of infection (pain, swelling, bleeding, redness, odor or green/yellow discharge around incision site)  As directed      Call MD for: severe or increased pain, loss or decreased feeling in affected limb(s)  As directed      Call MD for: temperature >100.5  As directed      Discharge wound care:  As directed      Comments:     Wash the groin wound with soap and water and pat dry. Then put a dry gauze or washcloth there to keep this area dry. Do not use Vaseline or neosporin on your incisions. Only use soap and water on your incisions and then protect and keep dry.     Driving Restrictions  As directed      Comments:     No driving for 2 weeks     Lifting restrictions  As directed      Comments:     No lifting for 6 weeks     Resume previous diet  As directed          Medication List     TAKE these medications       albuterol 108 (90 BASE) MCG/ACT inhaler    Commonly known as: PROVENTIL HFA;VENTOLIN HFA    Inhale 2 puffs into the lungs every 6 (six) hours as needed. For wheezing or shortness of breath    ALPRAZolam 0.25 MG tablet    Commonly known as: XANAX    Take 0.25 mg by mouth at bedtime. For sleep    furosemide 40 MG tablet    Commonly known as: LASIX    Take 40 mg by mouth daily.    glipiZIDE 5 MG tablet    Commonly known as: GLUCOTROL    Take 5 mg by mouth 2 (two) times daily before a meal.    insulin glargine 100 UNIT/ML injection    Commonly known as: LANTUS    Inject 17 Units into the skin at bedtime.    metFORMIN 1000 MG tablet    Commonly known as: GLUCOPHAGE    Take 1,000 mg by mouth 2 (two) times daily with a meal.  metoprolol tartrate 25 MG tablet    Commonly known as: LOPRESSOR    Take 25 mg by mouth 2 (two) times daily.       Verbal and written Discharge instructions given to the patient. Wound care per Discharge AVS      Follow-up Information    Follow up with Sherren Kerns, MD In 2 weeks. (Office will call you to arrange your appts (sent))    Contact information:    [address]  [place] [address]  [phone]     Disposition:  Discharge to :SNF- For VQI Registry use ---  Instructions: Press F2 to tab through selections. Delete question if not applicable.  Post-op:  Wound infection: No  Graft infection: No  Transfusion: No If yes, 0 units given  New Arrhythmia: No  Ipsilateral amputation: [x ] no, [ ]  Minor, [ ]  BKA, [ ]  AKA  Discharge patency: [ ]  Primary, [ ]  Primary assisted, [ ]  Secondary, [ ]  Occluded  Patency judged by: [x ] Dopper only, [ ]  Palpable graft pulse, [ ]  Palpable distal pulse, [ ]  ABI inc. > 0.15, [ ]  Duplex  Discharge ABI: R 0.66, L 0.40  D/C Ambulatory Status: Ambulatory with Assistance  Complications:  MI: [x ] No, [ ]  Troponin only, [ ]  EKG or Clinical  CHF: No  Resp failure: [x ] none, [ ]  Pneumonia, [ ]  Ventilator  Chg in renal function: [x ] none, [ ]  Inc. Cr > 0.5, [ ]  Temp. Dialysis, [ ]  Permanent dialysis  Stroke: [x ] None, [ ]  Minor, [ ]  Major  Return to OR: No  Reason for return to OR: [ ]  Bleeding, [ ]  Infection, [ ]  Thrombosis, [ ]  Revision  Discharge medications:  Statin use: No for medical reason  ASA use: No for medical reason  Plavix use: No for medical reason  Beta blocker use: Yes  Coumadin use: No for medical reason  Signed:  Mosetta Pigeon

## 2012-07-29 NOTE — Progress Notes (Signed)
Patient is a 72 year old male status post right femoral to below-knee popliteal bypass with contralateral saphenous vein on May 22nd. He also had amputation of his right fourth toe at that time. He returns today for further followup. He states that his right foot feels better. He is currently living at a skilled nursing facility. Of note his vein was fairly marginal.  A duplex scan in the hospital suggested he may have a mid graft stenosis. Due to the fact that he had a very large operation we deferred intervention on this for now.  Physical exam:  Filed Vitals:   07/29/12 0925  BP: 119/63  Pulse: 91  Temp: 98.8 F (37.1 C)  TempSrc: Oral  Height: 5\' 9"  (1.753 m)  Weight: 155 lb (70.308 kg)  SpO2: 98%   Extremities: Left lower extremity all incisions well-healed left foot has brisk capillary refill no open ulcers, right lower extremity brisk monophasic to biphasic peroneal Doppler signal the toe amputation site is clean and granulating and appears healthy incisions in the right leg are also well-healed staples were removed today  Assessment: Patent right femoropopliteal bypass with possible narrowing that currently his bypass is patent and his toe indication is healing. He will followup with [place] in one month and have a duplex of his bypass graft at that time. Continue local wound care for the toe amputation site.  Fabienne Bruns, MD [facility] [place] Office: [phone] Pager: [phone]

## 2012-07-30 NOTE — Progress Notes (Signed)
Date: 07/30/2012  MRN:  [id number] Name:  Daniel Tyler Sex:  male Age:  72 y.o. DOB:1941/02/03                         Facility/Room; [facility] 108A Level Of Care: Provider: Dr. Murray Hodgkins  Emergency Contacts: Contact Information   Name Relation Home Work Mobile   [place] Friend [phone]     Sanjuan Dame [phone]        Code Status:Full Code MOST Form:  Allergies:No Known Allergies   Chief Complaint  Patient presents with  . Medical Managment of Chronic Issues    New admit to SNF following hospitalization for nonhealing wound right foot     HPI: Patient is a 72 y.o. year old male who presents for evaluation of a nonhealing wound on his right fourth toe. The patient is referred by Dr. Elvin So. The patient states the wound on his toe has been present for a few months and slowly getting worse. On 07/14/12 had  right femoral to below knee popliteal bypass with non-reversed contralateral great saphenous vein . 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 Fabienne Bruns.  Patient was discharged 06/25/2012 to [facility] for rehab.    Past Medical History  Diagnosis Date  . Enlarged heart   . Hyperlipidemia   . Carotid stenosis   . Coronary artery disease   . Pneumonia 2013    hx of  . Anemia   . Diabetes mellitus     takes Glipizide and Metformin daily;Lantus nightly  . Myocardial infarction 2013  . Weakness of right leg   . Skin spots-aging   . Depression     bc of foot pain but no meds required    Past Surgical History  Procedure Laterality Date  . Cardiac catheterization  2013  . Endarterectomy  07/01/2011    Procedure: ENDARTERECTOMY CAROTID;  Surgeon: Sherren Kerns, MD;  Location: [facility] OR;  Service: Vascular;  Laterality: Right;  . Carotid endarterectomy  07/01/11    Right CEA  . Abdominal aortagram    . Coronary artery bypass graft  07/01/2011    Procedure: CORONARY ARTERY BYPASS GRAFTING (CABG);  Surgeon: Loreli Slot, MD;  Location: [facility] OR;  Service: Open Heart Surgery;  Laterality: N/A;  . Colonoscopy    . Femoral-popliteal bypass graft Right 07/14/2012    Procedure: BYPASS GRAFT FEMORAL-POPLITEAL ARTERY;  Surgeon: Sherren Kerns, MD;  Location: [facility] OR;  Service: Vascular;  Laterality: Right;  Right Femoral Popliteal Bypass with Left Leg Nonreversed Greater Saphenous Vein  . Amputation Right 07/14/2012    Procedure: AMPUTATION DIGIT;  Surgeon: Sherren Kerns, MD;  Location: [facility] OR;  Service: Vascular;  Laterality: Right;  Amputation Right Fourth Toe  . Intraoperative arteriogram Right 07/14/2012    Procedure: INTRA OPERATIVE ARTERIOGRAM;  Surgeon: Sherren Kerns, MD;  Location: [facility] OR;  Service: Vascular;  Laterality: Right;     Procedures:    07/15/2012 Right Angiogram   Intraoperative angiogram as described.  Consultants:  Dr. Fabienne Bruns Vascular surgeon   Current Outpatient Prescriptions  Medication Sig Dispense Refill  . albuterol (PROVENTIL HFA;VENTOLIN HFA) 108 (90 BASE) MCG/ACT inhaler Inhale 2 puffs into the lungs every 6 (six) hours as needed. For wheezing or shortness of breath      . ALPRAZolam (XANAX) 0.25 MG tablet Take 1 tablet (0.25 mg total) by mouth at bedtime. For sleep  30 tablet  3  . furosemide (LASIX) 40 MG tablet Take 40 mg by mouth daily.      Marland Kitchen glipiZIDE (GLUCOTROL) 5 MG tablet Take 5 mg by mouth 2 (two) times daily before a meal.      . insulin glargine (LANTUS) 100 UNIT/ML injection Inject 17 Units into the skin at bedtime.       . metFORMIN (GLUCOPHAGE) 1000 MG tablet Take 1,000 mg by mouth 2 (two) times daily with a meal.      . metoprolol tartrate (LOPRESSOR) 25 MG tablet Take 25 mg by mouth 2 (two) times daily.      Marland Kitchen oxyCODONE (OXY IR/ROXICODONE) 5 MG immediate release tablet Take one tablet by mouth every four hours as needed for pain; Take two tablet by mouth every four as needed for pain  360 tablet  0   No current facility-administered medications for  this visit.     There is no immunization history on file for this patient.   Diet:  History  Substance Use Topics  . Smoking status: Former Smoker -- 1.00 packs/day for 20 years    Types: Cigarettes    Quit date: 02/24/1978  . Smokeless tobacco: Never Used  . Alcohol Use: No    Family History  Problem Relation Age of Onset  . Heart disease Father   . Cancer Brother   . Diabetes Brother   . Diabetes Daughter   . Anesthesia problems Neg Hx   . Hypotension Neg Hx   . Malignant hyperthermia Neg Hx   . Pseudochol deficiency Neg Hx       Vital signs: BP 124/68  Pulse 79  Resp 20  Ht 5\' 8"  (1.727 m)  Wt 157 lb 6.4 oz (71.396 kg)  BMI 23.94 kg/m2  General Appearance:    Alert, cooperative, no distress, appears stated age  Head:    Normocephalic, without obvious abnormality, atraumatic  Eyes:    PERRL, conjunctiva/corneas clear, EOM's intact, fundi    benign, both eyes       Ears:    Normal TM's and external ear canals, both ears  Nose:   Nares normal, septum midline, mucosa normal, no drainage   or sinus tenderness  Throat:   Lips, mucosa, and tongue normal; teeth and gums normal  Neck:   Supple, symmetrical, trachea midline, no adenopathy;       thyroid:  No enlargement/tenderness/nodules; no carotid   bruit or JVD  Back:     Symmetric, no curvature, ROM normal, no CVA tenderness  Lungs:     Clear to auscultation bilaterally, respirations unlabored  Chest wall:    No tenderness or deformity  Heart:    Regular rate and rhythm, S1 and S2 normal, no murmur, rub   or gallop  Abdomen:     Soft, non-tender, bowel sounds active all four quadrants,    no masses, no organomegaly  Genitalia:    Normal male without lesion, discharge or tenderness  Rectal:    Normal tone, normal prostate, no masses or tenderness;   guaiac negative stool  Extremities:   Extremities normal, atraumatic, no cyanosis or edema  Pulses:   2+ and symmetric all extremities  Skin:   Skin color,  texture, turgor normal, no rashes or lesions  Lymph nodes:   Cervical, supraclavicular, and axillary nodes normal  Neurologic:   CNII-XII intact. Normal strength, sensation and reflexes      throughout    Screening Score  MMS    PHQ2  PHQ9     Fall Risk    BIMS    Admission on 07/14/2012, Discharged on 07/20/2012  Component Date Value Range Status  . Glucose-Capillary 07/14/2012 245* 70 - 99 mg/dL Final  . Glucose-Capillary 07/14/2012 202* 70 - 99 mg/dL Final  . Glucose-Capillary 07/14/2012 135* 70 - 99 mg/dL Final  . Comment 1 40/98/1191 Documented in Chart   Final  . Comment 2 07/14/2012 Notify RN   Final  . WBC 07/15/2012 10.1  4.0 - 10.5 K/uL Final  . RBC 07/15/2012 2.98* 4.22 - 5.81 MIL/uL Final  . Hemoglobin 07/15/2012 8.6* 13.0 - 17.0 g/dL Final   REPEATED TO VERIFY  . HCT 07/15/2012 25.8* 39.0 - 52.0 % Final  . MCV 07/15/2012 86.6  78.0 - 100.0 fL Final  . MCH 07/15/2012 28.9  26.0 - 34.0 pg Final  . MCHC 07/15/2012 33.3  30.0 - 36.0 g/dL Final  . RDW 47/82/9562 14.9  11.5 - 15.5 % Final  . Platelets 07/15/2012 297  150 - 400 K/uL Final  . Sodium 07/15/2012 133* 135 - 145 mEq/L Final  . Potassium 07/15/2012 4.5  3.5 - 5.1 mEq/L Final  . Chloride 07/15/2012 97  96 - 112 mEq/L Final  . CO2 07/15/2012 20  19 - 32 mEq/L Final  . Glucose, Bld 07/15/2012 228* 70 - 99 mg/dL Final  . BUN 13/09/6576 10  6 - 23 mg/dL Final  . Creatinine, Ser 07/15/2012 0.75  0.50 - 1.35 mg/dL Final  . Calcium 46/96/2952 8.5  8.4 - 10.5 mg/dL Final  . GFR calc non Af Amer 07/15/2012 >90  >90 mL/min Final  . GFR calc Af Amer 07/15/2012 >90  >90 mL/min Final   Comment:                                 The eGFR has been calculated                          using the CKD EPI equation.                          This calculation has not been                          validated in all clinical                          situations.                          eGFR's persistently                           <90 mL/min signify                          possible Chronic Kidney Disease.  Marland Kitchen Hemoglobin A1C 07/15/2012 7.4* <5.7 % Final   Comment: (NOTE)  According to the ADA Clinical Practice Recommendations for 2011, when                          HbA1c is used as a screening test:                           >=6.5%   Diagnostic of Diabetes Mellitus                                    (if abnormal result is confirmed)                          5.7-6.4%   Increased risk of developing Diabetes Mellitus                          References:Diagnosis and Classification of Diabetes Mellitus,Diabetes                          Care,2011,34(Suppl 1):S62-S69 and Standards of Medical Care in                                  Diabetes - 2011,Diabetes Care,2011,34 (Suppl 1):S11-S61.  . Mean Plasma Glucose 07/15/2012 166* <117 mg/dL Final  . Glucose-Capillary 07/14/2012 231* 70 - 99 mg/dL Final  . Comment 1 16/11/9602 Notify RN   Final  . Comment 2 07/14/2012 Documented in Chart   Final  . Glucose-Capillary 07/15/2012 219* 70 - 99 mg/dL Final  . Comment 1 54/10/8117 Documented in Chart   Final  . Comment 2 07/15/2012 Notify RN   Final  . Glucose-Capillary 07/15/2012 213* 70 - 99 mg/dL Final  . Comment 1 14/78/2956 Documented in Chart   Final  . Comment 2 07/15/2012 Notify RN   Final  . Glucose-Capillary 07/15/2012 217* 70 - 99 mg/dL Final  . Glucose-Capillary 07/15/2012 147* 70 - 99 mg/dL Final  . Glucose-Capillary 07/16/2012 138* 70 - 99 mg/dL Final  . Glucose-Capillary 07/16/2012 147* 70 - 99 mg/dL Final  . Comment 1 21/30/8657 Notify RN   Final  . Comment 2 07/16/2012 Documented in Chart   Final  . Glucose-Capillary 07/16/2012 223* 70 - 99 mg/dL Final  . Comment 1 84/69/6295 Notify RN   Final  . Comment 2 07/16/2012 Documented in Chart   Final  . Glucose-Capillary 07/16/2012 90  70 - 99 mg/dL  Final  . Comment 1 28/41/3244 Documented in Chart   Final  . Comment 2 07/16/2012 Notify RN   Final  . Glucose-Capillary 07/17/2012 165* 70 - 99 mg/dL Final  . Comment 1 02/26/7251 Documented in Chart   Final  . Comment 2 07/17/2012 Notify RN   Final  . Glucose-Capillary 07/17/2012 163* 70 - 99 mg/dL Final  . Comment 1 66/44/0347 Notify RN   Final  . Comment 2 07/17/2012 Documented in Chart   Final  . Glucose-Capillary 07/17/2012 158* 70 - 99 mg/dL Final  . Comment 1 42/59/5638 Notify RN   Final  . Comment 2 07/17/2012 Documented in Chart   Final  . Glucose-Capillary 07/17/2012 157* 70 - 99 mg/dL Final  . Comment 1 75/64/3329 Documented in Chart   Final  . Comment 2 07/17/2012 Notify  RN   Final  . Glucose-Capillary 07/18/2012 135* 70 - 99 mg/dL Final  . Glucose-Capillary 07/18/2012 222* 70 - 99 mg/dL Final  . Comment 1 16/11/9602 Notify RN   Final  . Comment 2 07/18/2012 Documented in Chart   Final  . Glucose-Capillary 07/18/2012 192* 70 - 99 mg/dL Final  . Comment 1 54/10/8117 Notify RN   Final  . Comment 2 07/18/2012 Documented in Chart   Final  . Glucose-Capillary 07/18/2012 146* 70 - 99 mg/dL Final  . Comment 1 14/78/2956 Documented in Chart   Final  . Comment 2 07/18/2012 Notify RN   Final  . Glucose-Capillary 07/19/2012 128* 70 - 99 mg/dL Final  . Comment 1 21/30/8657 Documented in Chart   Final  . Comment 2 07/19/2012 Notify RN   Final  . Glucose-Capillary 07/19/2012 158* 70 - 99 mg/dL Final  . Comment 1 84/69/6295 Documented in Chart   Final  . Comment 2 07/19/2012 Notify RN   Final  . Glucose-Capillary 07/19/2012 182* 70 - 99 mg/dL Final  . Comment 1 28/41/3244 Documented in Chart   Final  . Comment 2 07/19/2012 Notify RN   Final  . WBC 07/20/2012 9.6  4.0 - 10.5 K/uL Final   WHITE COUNT CONFIRMED ON SMEAR  . RBC 07/20/2012 2.88* 4.22 - 5.81 MIL/uL Final  . Hemoglobin 07/20/2012 8.4* 13.0 - 17.0 g/dL Final  . HCT 02/26/7251 24.8* 39.0 - 52.0 % Final  . MCV 07/20/2012  86.1  78.0 - 100.0 fL Final  . MCH 07/20/2012 29.2  26.0 - 34.0 pg Final  . MCHC 07/20/2012 33.9  30.0 - 36.0 g/dL Final  . RDW 66/44/0347 14.7  11.5 - 15.5 % Final  . Platelets 07/20/2012 406* 150 - 400 K/uL Final  . Sodium 07/20/2012 130* 135 - 145 mEq/L Final  . Potassium 07/20/2012 4.0  3.5 - 5.1 mEq/L Final  . Chloride 07/20/2012 92* 96 - 112 mEq/L Final  . CO2 07/20/2012 25  19 - 32 mEq/L Final  . Glucose, Bld 07/20/2012 156* 70 - 99 mg/dL Final  . BUN 42/59/5638 20  6 - 23 mg/dL Final  . Creatinine, Ser 07/20/2012 0.76  0.50 - 1.35 mg/dL Final  . Calcium 75/64/3329 9.2  8.4 - 10.5 mg/dL Final  . GFR calc non Af Amer 07/20/2012 90* >90 mL/min Final  . GFR calc Af Amer 07/20/2012 >90  >90 mL/min Final   Comment:                                 The eGFR has been calculated                          using the CKD EPI equation.                          This calculation has not been                          validated in all clinical                          situations.                          eGFR's persistently                          <  90 mL/min signify                          possible Chronic Kidney Disease.  . Glucose-Capillary 07/19/2012 131* 70 - 99 mg/dL Final  . Comment 1 16/11/9602 Documented in Chart   Final  . Comment 2 07/19/2012 Notify RN   Final  . Glucose-Capillary 07/20/2012 156* 70 - 99 mg/dL Final  . Glucose-Capillary 07/20/2012 150* 70 - 99 mg/dL Final  . Comment 1 54/10/8117 Notify RN   Final  . Comment 2 07/20/2012 Documented in Chart   Final  Hospital Outpatient Visit on 07/13/2012  Component Date Value Range Status  . MRSA, PCR 07/13/2012 NEGATIVE  NEGATIVE Final  . Staphylococcus aureus 07/13/2012 NEGATIVE  NEGATIVE Final   Comment:                                 The Xpert SA Assay (FDA                          approved for NASAL specimens                          in patients over 3 years of age),                          is one component of                           a comprehensive surveillance                          program.  Test performance has                          been validated by [employer] for patients greater                          than or equal to 52 year old.                          It is not intended                          to diagnose infection nor to                          guide or monitor treatment.  Marland Kitchen aPTT 07/13/2012 35  24 - 37 seconds Final  . WBC 07/13/2012 9.8  4.0 - 10.5 K/uL Final  . RBC 07/13/2012 3.82* 4.22 - 5.81 MIL/uL Final  . Hemoglobin 07/13/2012 11.1* 13.0 - 17.0 g/dL Final  . HCT 14/78/2956 32.9* 39.0 - 52.0 % Final  . MCV 07/13/2012 86.1  78.0 - 100.0 fL Final  . MCH 07/13/2012 29.1  26.0 - 34.0 pg Final  . MCHC 07/13/2012 33.7  30.0 - 36.0 g/dL Final  . RDW 21/30/8657 14.4  11.5 - 15.5 % Final  . Platelets  07/13/2012 341  150 - 400 K/uL Final  . Sodium 07/13/2012 132* 135 - 145 mEq/L Final  . Potassium 07/13/2012 4.7  3.5 - 5.1 mEq/L Final  . Chloride 07/13/2012 98  96 - 112 mEq/L Final  . CO2 07/13/2012 18* 19 - 32 mEq/L Final  . Glucose, Bld 07/13/2012 307* 70 - 99 mg/dL Final  . BUN 16/11/9602 15  6 - 23 mg/dL Final  . Creatinine, Ser 07/13/2012 0.82  0.50 - 1.35 mg/dL Final  . Calcium 54/10/8117 9.2  8.4 - 10.5 mg/dL Final  . Total Protein 07/13/2012 7.8  6.0 - 8.3 g/dL Final  . Albumin 14/78/2956 3.5  3.5 - 5.2 g/dL Final  . AST 21/30/8657 12  0 - 37 U/L Final  . ALT 07/13/2012 9  0 - 53 U/L Final  . Alkaline Phosphatase 07/13/2012 60  39 - 117 U/L Final  . Total Bilirubin 07/13/2012 0.2* 0.3 - 1.2 mg/dL Final  . GFR calc non Af Amer 07/13/2012 87* >90 mL/min Final  . GFR calc Af Amer 07/13/2012 >90  >90 mL/min Final   Comment:                                 The eGFR has been calculated                          using the CKD EPI equation.                          This calculation has not been                          validated in all clinical                           situations.                          eGFR's persistently                          <90 mL/min signify                          possible Chronic Kidney Disease.  Marland Kitchen Prothrombin Time 07/13/2012 12.3  11.6 - 15.2 seconds Final  . INR 07/13/2012 0.92  0.00 - 1.49 Final  . ABO/RH(D) 07/13/2012 A POS   Final  . Antibody Screen 07/13/2012 NEG   Final  . Sample Expiration 07/13/2012 07/27/2012   Final  . Color, Urine 07/13/2012 YELLOW  YELLOW Final  . APPearance 07/13/2012 CLEAR  CLEAR Final  . Specific Gravity, Urine 07/13/2012 1.035* 1.005 - 1.030 Final  . pH 07/13/2012 5.5  5.0 - 8.0 Final  . Glucose, UA 07/13/2012 >1000* NEGATIVE mg/dL Final  . Hgb urine dipstick 07/13/2012 NEGATIVE  NEGATIVE Final  . Bilirubin Urine 07/13/2012 NEGATIVE  NEGATIVE Final  . Ketones, ur 07/13/2012 NEGATIVE  NEGATIVE mg/dL Final  . Protein, ur 84/69/6295 NEGATIVE  NEGATIVE mg/dL Final  . Urobilinogen, UA 07/13/2012 1.0  0.0 - 1.0 mg/dL Final  . Nitrite 28/41/3244 NEGATIVE  NEGATIVE Final  . Leukocytes, UA 07/13/2012 NEGATIVE  NEGATIVE Final  . Squamous Epithelial / LPF 07/13/2012 RARE  RARE Final  . RBC / HPF 07/13/2012 0-2  <3 RBC/hpf Final  Admission on 07/09/2012, Discharged on 07/09/2012  Component Date Value Range Status  . Sodium 07/09/2012 137  135 - 145 mEq/L Final  . Potassium 07/09/2012 4.4  3.5 - 5.1 mEq/L Final  . Chloride 07/09/2012 104  96 - 112 mEq/L Final  . BUN 07/09/2012 21  6 - 23 mg/dL Final  . Creatinine, Ser 07/09/2012 0.80  0.50 - 1.35 mg/dL Final  . Glucose, Bld 16/11/9602 193* 70 - 99 mg/dL Final  . Calcium, Ion 54/10/8117 1.27  1.13 - 1.30 mmol/L Final  . TCO2 07/09/2012 25  0 - 100 mmol/L Final  . Hemoglobin 07/09/2012 13.6  13.0 - 17.0 g/dL Final  . HCT 14/78/2956 40.0  39.0 - 52.0 % Final  . Glucose-Capillary 07/09/2012 168* 70 - 99 mg/dL Final  . Glucose-Capillary 07/09/2012 150* 70 - 99 mg/dL Final  . Comment 1 21/30/8657 Documented in Chart   Final  .  Comment 2 07/09/2012 Notify RN   Final     Annual summary: Hospitalizations:   Infection History:  Functional assessment: Areas of potential improvement: Rehabilitation Potential: Prognosis for survival: Plan:

## 2012-08-05 DIAGNOSIS — R05 Cough: Secondary | ICD-10-CM

## 2012-08-05 DIAGNOSIS — R059 Cough, unspecified: Secondary | ICD-10-CM

## 2012-08-06 ENCOUNTER — Encounter: Payer: Self-pay | Admitting: Nurse Practitioner

## 2012-08-06 ENCOUNTER — Non-Acute Institutional Stay (SKILLED_NURSING_FACILITY): Payer: Medicare Other | Admitting: Nurse Practitioner

## 2012-08-06 DIAGNOSIS — T8140XA Infection following a procedure, unspecified, initial encounter: Secondary | ICD-10-CM

## 2012-08-06 DIAGNOSIS — R05 Cough: Secondary | ICD-10-CM

## 2012-08-06 NOTE — Progress Notes (Signed)
Date: 08/06/2012  MRN:  [id number] Name:  Daniel Tyler Sex:  male Age:  72 y.o. DOB:04/04/1940                       Facility/Room; [facility] Level Of Care: SNF Provider: Dr. Murray Hodgkins  Emergency Contacts: Contact Information   Name Relation Home Work Mobile   [place] Friend [phone]     Sanjuan Dame [phone]        Code Status: MOST Form:  Allergies:No Known Allergies   Chief Complaint  Patient presents with  . Medical Managment of Chronic Issues    wound check      HPI:  Past Medical History  Diagnosis Date  . Enlarged heart   . Hyperlipidemia   . Carotid stenosis   . Coronary artery disease   . Pneumonia 2013    hx of  . Anemia   . Diabetes mellitus     takes Glipizide and Metformin daily;Lantus nightly  . Myocardial infarction 2013  . Weakness of right leg   . Skin spots-aging   . Depression     bc of foot pain but no meds required    Past Surgical History  Procedure Laterality Date  . Cardiac catheterization  2013  . Endarterectomy  07/01/2011    Procedure: ENDARTERECTOMY CAROTID;  Surgeon: Sherren Kerns, MD;  Location: [facility] OR;  Service: Vascular;  Laterality: Right;  . Carotid endarterectomy  07/01/11    Right CEA  . Abdominal aortagram    . Coronary artery bypass graft  07/01/2011    Procedure: CORONARY ARTERY BYPASS GRAFTING (CABG);  Surgeon: Loreli Slot, MD;  Location: [facility] OR;  Service: Open Heart Surgery;  Laterality: N/A;  . Colonoscopy    . Femoral-popliteal bypass graft Right 07/14/2012    Procedure: BYPASS GRAFT FEMORAL-POPLITEAL ARTERY;  Surgeon: Sherren Kerns, MD;  Location: [facility] OR;  Service: Vascular;  Laterality: Right;  Right Femoral Popliteal Bypass with Left Leg Nonreversed Greater Saphenous Vein  . Amputation Right 07/14/2012    Procedure: AMPUTATION DIGIT;  Surgeon: Sherren Kerns, MD;  Location: [facility] OR;  Service: Vascular;  Laterality: Right;  Amputation Right Fourth Toe  . Intraoperative arteriogram  Right 07/14/2012    Procedure: INTRA OPERATIVE ARTERIOGRAM;  Surgeon: Sherren Kerns, MD;  Location: [facility] OR;  Service: Vascular;  Laterality: Right;     Procedures: Consultants:  Current Outpatient Prescriptions  Medication Sig Dispense Refill  . albuterol (PROVENTIL HFA;VENTOLIN HFA) 108 (90 BASE) MCG/ACT inhaler Inhale 2 puffs into the lungs every 6 (six) hours as needed. For wheezing or shortness of breath      . ALPRAZolam (XANAX) 0.25 MG tablet Take 1 tablet (0.25 mg total) by mouth at bedtime. For sleep  30 tablet  3  . furosemide (LASIX) 40 MG tablet Take 40 mg by mouth daily.      Marland Kitchen glipiZIDE (GLUCOTROL) 5 MG tablet Take 5 mg by mouth 2 (two) times daily before a meal.      . insulin glargine (LANTUS) 100 UNIT/ML injection Inject 17 Units into the skin at bedtime.       . metFORMIN (GLUCOPHAGE) 1000 MG tablet Take 1,000 mg by mouth 2 (two) times daily with a meal.      . metoprolol tartrate (LOPRESSOR) 25 MG tablet Take 25 mg by mouth 2 (two) times daily.      Marland Kitchen oxyCODONE (OXY IR/ROXICODONE) 5 MG immediate release tablet Take one tablet by mouth  every four hours as needed for pain; Take two tablet by mouth every four as needed for pain  360 tablet  0   No current facility-administered medications for this visit.     There is no immunization history on file for this patient.   Diet:  History  Substance Use Topics  . Smoking status: Former Smoker -- 1.00 packs/day for 20 years    Types: Cigarettes    Quit date: 02/24/1978  . Smokeless tobacco: Never Used  . Alcohol Use: No    Family History  Problem Relation Age of Onset  . Heart disease Father   . Cancer Brother   . Diabetes Brother   . Diabetes Daughter   . Anesthesia problems Neg Hx   . Hypotension Neg Hx   . Malignant hyperthermia Neg Hx   . Pseudochol deficiency Neg Hx        Vital signs: BP 124/68  Pulse 74  Resp 20  Wt 142 lb 9.6 oz (64.683 kg)  BMI 21.69 kg/m2    General Appearance:    Alert,  cooperative, no distress, appears stated age  Head:    Normocephalic, without obvious abnormality, atraumatic  Eyes:    PERRL, conjunctiva/corneas clear, EOM's intact, fundi    benign, both eyes       Ears:    Normal TM's and external ear canals, both ears  Nose:   Nares normal, septum midline, mucosa normal, no drainage   or sinus tenderness  Throat:   Lips, mucosa, and tongue normal; teeth and gums normal  Neck:   Supple, symmetrical, trachea midline, no adenopathy;       thyroid:  No enlargement/tenderness/nodules; no carotid   bruit or JVD  Back:     Symmetric, no curvature, ROM normal, no CVA tenderness  Lungs:     Clear to auscultation bilaterally, respirations unlabored  Chest wall:    No tenderness or deformity  Heart:    Regular rate and rhythm, S1 and S2 normal, no murmur, rub   or gallop  Abdomen:     Soft, non-tender, bowel sounds active all four quadrants,    no masses, no organomegaly  Genitalia:    Normal male without lesion, discharge or tenderness  Rectal:    Normal tone, normal prostate, no masses or tenderness;   guaiac negative stool  Extremities:   Extremities normal, atraumatic, no cyanosis or edema  Pulses:   2+ and symmetric all extremities  Skin:   Skin color, texture, turgor normal, no rashes or lesions  Lymph nodes:   Cervical, supraclavicular, and axillary nodes normal  Neurologic:   CNII-XII intact. Normal strength, sensation and reflexes      throughout    Screening Score  MMS    PHQ2    PHQ9     Fall Risk    BIMS     Admission on 07/14/2012, Discharged on 07/20/2012  Component Date Value Range Status  . Glucose-Capillary 07/14/2012 245* 70 - 99 mg/dL Final  . Glucose-Capillary 07/14/2012 202* 70 - 99 mg/dL Final  . Glucose-Capillary 07/14/2012 135* 70 - 99 mg/dL Final  . Comment 1 29/56/2130 Documented in Chart   Final  . Comment 2 07/14/2012 Notify RN   Final  . WBC 07/15/2012 10.1  4.0 - 10.5 K/uL Final  . RBC 07/15/2012 2.98* 4.22 - 5.81  MIL/uL Final  . Hemoglobin 07/15/2012 8.6* 13.0 - 17.0 g/dL Final   REPEATED TO VERIFY  . HCT 07/15/2012 25.8* 39.0 - 52.0 %  Final  . MCV 07/15/2012 86.6  78.0 - 100.0 fL Final  . MCH 07/15/2012 28.9  26.0 - 34.0 pg Final  . MCHC 07/15/2012 33.3  30.0 - 36.0 g/dL Final  . RDW 16/11/9602 14.9  11.5 - 15.5 % Final  . Platelets 07/15/2012 297  150 - 400 K/uL Final  . Sodium 07/15/2012 133* 135 - 145 mEq/L Final  . Potassium 07/15/2012 4.5  3.5 - 5.1 mEq/L Final  . Chloride 07/15/2012 97  96 - 112 mEq/L Final  . CO2 07/15/2012 20  19 - 32 mEq/L Final  . Glucose, Bld 07/15/2012 228* 70 - 99 mg/dL Final  . BUN 54/10/8117 10  6 - 23 mg/dL Final  . Creatinine, Ser 07/15/2012 0.75  0.50 - 1.35 mg/dL Final  . Calcium 14/78/2956 8.5  8.4 - 10.5 mg/dL Final  . GFR calc non Af Amer 07/15/2012 >90  >90 mL/min Final  . GFR calc Af Amer 07/15/2012 >90  >90 mL/min Final   Comment:                                 The eGFR has been calculated                          using the CKD EPI equation.                          This calculation has not been                          validated in all clinical                          situations.                          eGFR's persistently                          <90 mL/min signify                          possible Chronic Kidney Disease.  Marland Kitchen Hemoglobin A1C 07/15/2012 7.4* <5.7 % Final   Comment: (NOTE)                                                                                                                         According to the ADA Clinical Practice Recommendations for 2011, when                          HbA1c is used as a screening test:                           >=  6.5%   Diagnostic of Diabetes Mellitus                                    (if abnormal result is confirmed)                          5.7-6.4%   Increased risk of developing Diabetes Mellitus                          References:Diagnosis and Classification of Diabetes Mellitus,Diabetes                           Care,2011,34(Suppl 1):S62-S69 and Standards of Medical Care in                                  Diabetes - 2011,Diabetes Care,2011,34 (Suppl 1):S11-S61.  . Mean Plasma Glucose 07/15/2012 166* <117 mg/dL Final  . Glucose-Capillary 07/14/2012 231* 70 - 99 mg/dL Final  . Comment 1 13/09/6576 Notify RN   Final  . Comment 2 07/14/2012 Documented in Chart   Final  . Glucose-Capillary 07/15/2012 219* 70 - 99 mg/dL Final  . Comment 1 46/96/2952 Documented in Chart   Final  . Comment 2 07/15/2012 Notify RN   Final  . Glucose-Capillary 07/15/2012 213* 70 - 99 mg/dL Final  . Comment 1 84/13/2440 Documented in Chart   Final  . Comment 2 07/15/2012 Notify RN   Final  . Glucose-Capillary 07/15/2012 217* 70 - 99 mg/dL Final  . Glucose-Capillary 07/15/2012 147* 70 - 99 mg/dL Final  . Glucose-Capillary 07/16/2012 138* 70 - 99 mg/dL Final  . Glucose-Capillary 07/16/2012 147* 70 - 99 mg/dL Final  . Comment 1 12/21/2534 Notify RN   Final  . Comment 2 07/16/2012 Documented in Chart   Final  . Glucose-Capillary 07/16/2012 223* 70 - 99 mg/dL Final  . Comment 1 64/40/3474 Notify RN   Final  . Comment 2 07/16/2012 Documented in Chart   Final  . Glucose-Capillary 07/16/2012 90  70 - 99 mg/dL Final  . Comment 1 25/95/6387 Documented in Chart   Final  . Comment 2 07/16/2012 Notify RN   Final  . Glucose-Capillary 07/17/2012 165* 70 - 99 mg/dL Final  . Comment 1 56/43/3295 Documented in Chart   Final  . Comment 2 07/17/2012 Notify RN   Final  . Glucose-Capillary 07/17/2012 163* 70 - 99 mg/dL Final  . Comment 1 18/84/1660 Notify RN   Final  . Comment 2 07/17/2012 Documented in Chart   Final  . Glucose-Capillary 07/17/2012 158* 70 - 99 mg/dL Final  . Comment 1 63/02/6008 Notify RN   Final  . Comment 2 07/17/2012 Documented in Chart   Final  . Glucose-Capillary 07/17/2012 157* 70 - 99 mg/dL Final  . Comment 1 93/23/5573 Documented in Chart   Final  . Comment 2 07/17/2012 Notify RN   Final  .  Glucose-Capillary 07/18/2012 135* 70 - 99 mg/dL Final  . Glucose-Capillary 07/18/2012 222* 70 - 99 mg/dL Final  . Comment 1 22/03/5425 Notify RN   Final  . Comment 2 07/18/2012 Documented in Chart   Final  . Glucose-Capillary 07/18/2012 192* 70 - 99 mg/dL Final  . Comment 1 08/17/7626 Notify RN   Final  .  Comment 2 07/18/2012 Documented in Chart   Final  . Glucose-Capillary 07/18/2012 146* 70 - 99 mg/dL Final  . Comment 1 16/11/9602 Documented in Chart   Final  . Comment 2 07/18/2012 Notify RN   Final  . Glucose-Capillary 07/19/2012 128* 70 - 99 mg/dL Final  . Comment 1 54/10/8117 Documented in Chart   Final  . Comment 2 07/19/2012 Notify RN   Final  . Glucose-Capillary 07/19/2012 158* 70 - 99 mg/dL Final  . Comment 1 14/78/2956 Documented in Chart   Final  . Comment 2 07/19/2012 Notify RN   Final  . Glucose-Capillary 07/19/2012 182* 70 - 99 mg/dL Final  . Comment 1 21/30/8657 Documented in Chart   Final  . Comment 2 07/19/2012 Notify RN   Final  . WBC 07/20/2012 9.6  4.0 - 10.5 K/uL Final   WHITE COUNT CONFIRMED ON SMEAR  . RBC 07/20/2012 2.88* 4.22 - 5.81 MIL/uL Final  . Hemoglobin 07/20/2012 8.4* 13.0 - 17.0 g/dL Final  . HCT 84/69/6295 24.8* 39.0 - 52.0 % Final  . MCV 07/20/2012 86.1  78.0 - 100.0 fL Final  . MCH 07/20/2012 29.2  26.0 - 34.0 pg Final  . MCHC 07/20/2012 33.9  30.0 - 36.0 g/dL Final  . RDW 28/41/3244 14.7  11.5 - 15.5 % Final  . Platelets 07/20/2012 406* 150 - 400 K/uL Final  . Sodium 07/20/2012 130* 135 - 145 mEq/L Final  . Potassium 07/20/2012 4.0  3.5 - 5.1 mEq/L Final  . Chloride 07/20/2012 92* 96 - 112 mEq/L Final  . CO2 07/20/2012 25  19 - 32 mEq/L Final  . Glucose, Bld 07/20/2012 156* 70 - 99 mg/dL Final  . BUN 02/26/7251 20  6 - 23 mg/dL Final  . Creatinine, Ser 07/20/2012 0.76  0.50 - 1.35 mg/dL Final  . Calcium 66/44/0347 9.2  8.4 - 10.5 mg/dL Final  . GFR calc non Af Amer 07/20/2012 90* >90 mL/min Final  . GFR calc Af Amer 07/20/2012 >90  >90 mL/min  Final   Comment:                                 The eGFR has been calculated                          using the CKD EPI equation.                          This calculation has not been                          validated in all clinical                          situations.                          eGFR's persistently                          <90 mL/min signify                          possible Chronic Kidney Disease.  . Glucose-Capillary 07/19/2012 131* 70 - 99 mg/dL Final  . Comment 1  07/19/2012 Documented in Chart   Final  . Comment 2 07/19/2012 Notify RN   Final  . Glucose-Capillary 07/20/2012 156* 70 - 99 mg/dL Final  . Glucose-Capillary 07/20/2012 150* 70 - 99 mg/dL Final  . Comment 1 40/98/1191 Notify RN   Final  . Comment 2 07/20/2012 Documented in Chart   Final  Hospital Outpatient Visit on 07/13/2012  Component Date Value Range Status  . MRSA, PCR 07/13/2012 NEGATIVE  NEGATIVE Final  . Staphylococcus aureus 07/13/2012 NEGATIVE  NEGATIVE Final   Comment:                                 The Xpert SA Assay (FDA                          approved for NASAL specimens                          in patients over 44 years of age),                          is one component of                          a comprehensive surveillance                          program.  Test performance has                          been validated by [employer] for patients greater                          than or equal to 55 year old.                          It is not intended                          to diagnose infection nor to                          guide or monitor treatment.  Marland Kitchen aPTT 07/13/2012 35  24 - 37 seconds Final  . WBC 07/13/2012 9.8  4.0 - 10.5 K/uL Final  . RBC 07/13/2012 3.82* 4.22 - 5.81 MIL/uL Final  . Hemoglobin 07/13/2012 11.1* 13.0 - 17.0 g/dL Final  . HCT 47/82/9562 32.9* 39.0 - 52.0 % Final  . MCV 07/13/2012 86.1  78.0 - 100.0 fL Final  . MCH 07/13/2012  29.1  26.0 - 34.0 pg Final  . MCHC 07/13/2012 33.7  30.0 - 36.0 g/dL Final  . RDW 13/09/6576 14.4  11.5 - 15.5 % Final  . Platelets 07/13/2012 341  150 - 400 K/uL Final  . Sodium 07/13/2012 132* 135 - 145 mEq/L Final  . Potassium 07/13/2012 4.7  3.5 - 5.1 mEq/L Final  . Chloride 07/13/2012 98  96 - 112 mEq/L Final  . CO2 07/13/2012 18* 19 -  32 mEq/L Final  . Glucose, Bld 07/13/2012 307* 70 - 99 mg/dL Final  . BUN 96/05/5407 15  6 - 23 mg/dL Final  . Creatinine, Ser 07/13/2012 0.82  0.50 - 1.35 mg/dL Final  . Calcium 81/19/1478 9.2  8.4 - 10.5 mg/dL Final  . Total Protein 07/13/2012 7.8  6.0 - 8.3 g/dL Final  . Albumin 29/56/2130 3.5  3.5 - 5.2 g/dL Final  . AST 86/57/8469 12  0 - 37 U/L Final  . ALT 07/13/2012 9  0 - 53 U/L Final  . Alkaline Phosphatase 07/13/2012 60  39 - 117 U/L Final  . Total Bilirubin 07/13/2012 0.2* 0.3 - 1.2 mg/dL Final  . GFR calc non Af Amer 07/13/2012 87* >90 mL/min Final  . GFR calc Af Amer 07/13/2012 >90  >90 mL/min Final   Comment:                                 The eGFR has been calculated                          using the CKD EPI equation.                          This calculation has not been                          validated in all clinical                          situations.                          eGFR's persistently                          <90 mL/min signify                          possible Chronic Kidney Disease.  Marland Kitchen Prothrombin Time 07/13/2012 12.3  11.6 - 15.2 seconds Final  . INR 07/13/2012 0.92  0.00 - 1.49 Final  . ABO/RH(D) 07/13/2012 A POS   Final  . Antibody Screen 07/13/2012 NEG   Final  . Sample Expiration 07/13/2012 07/27/2012   Final  . Color, Urine 07/13/2012 YELLOW  YELLOW Final  . APPearance 07/13/2012 CLEAR  CLEAR Final  . Specific Gravity, Urine 07/13/2012 1.035* 1.005 - 1.030 Final  . pH 07/13/2012 5.5  5.0 - 8.0 Final  . Glucose, UA 07/13/2012 >1000* NEGATIVE mg/dL Final  . Hgb urine dipstick 07/13/2012 NEGATIVE   NEGATIVE Final  . Bilirubin Urine 07/13/2012 NEGATIVE  NEGATIVE Final  . Ketones, ur 07/13/2012 NEGATIVE  NEGATIVE mg/dL Final  . Protein, ur 62/95/2841 NEGATIVE  NEGATIVE mg/dL Final  . Urobilinogen, UA 07/13/2012 1.0  0.0 - 1.0 mg/dL Final  . Nitrite 32/44/0102 NEGATIVE  NEGATIVE Final  . Leukocytes, UA 07/13/2012 NEGATIVE  NEGATIVE Final  . Squamous Epithelial / LPF 07/13/2012 RARE  RARE Final  . RBC / HPF 07/13/2012 0-2  <3 RBC/hpf Final  Admission on 07/09/2012, Discharged on 07/09/2012  Component Date Value Range Status  . Sodium 07/09/2012 137  135 - 145 mEq/L Final  . Potassium 07/09/2012 4.4  3.5 - 5.1 mEq/L  Final  . Chloride 07/09/2012 104  96 - 112 mEq/L Final  . BUN 07/09/2012 21  6 - 23 mg/dL Final  . Creatinine, Ser 07/09/2012 0.80  0.50 - 1.35 mg/dL Final  . Glucose, Bld 08/65/7846 193* 70 - 99 mg/dL Final  . Calcium, Ion 96/29/5284 1.27  1.13 - 1.30 mmol/L Final  . TCO2 07/09/2012 25  0 - 100 mmol/L Final  . Hemoglobin 07/09/2012 13.6  13.0 - 17.0 g/dL Final  . HCT 13/24/4010 40.0  39.0 - 52.0 % Final  . Glucose-Capillary 07/09/2012 168* 70 - 99 mg/dL Final  . Glucose-Capillary 07/09/2012 150* 70 - 99 mg/dL Final  . Comment 1 27/25/3664 Documented in Chart   Final  . Comment 2 07/09/2012 Notify RN   Final     Annual summary: Hospitalizations:  Infection History:  Functional assessment: Areas of potential improvement: Rehabilitation Potential: Prognosis for survival: Plan:

## 2012-08-06 NOTE — Progress Notes (Signed)
Patient ID: Daniel Tyler, male   DOB: 06-16-1940, 72 y.o.   MRN: [id number]  Nursing Home Location:  [facility]   Place of Service: SNF (31)   Chief Complaint: acute visit  HPI:  Patient is a 72 y.o. year old male with a H/o hyperlipidemia, hypertension, diabetes, coronary disease who on 07-14-2012 Dr. 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  Pt is at [facility] for rehab and is being seen today for cough and congestion. Pt with decreased energy, worsening fatigue and malaise. Pt reports he has had increased congestion and has been coughing up green yellowish thick sputum. Pt reports he has had an increase shortness of breath over the last 3 days and is needing oxygen and frequent breathing treatments.   Review of Systems:  Review of Systems  Constitutional: Positive for malaise/fatigue. Negative for fever and chills.  HENT: Negative for ear pain, congestion, sore throat and ear discharge.   Respiratory: Positive for cough, sputum production, shortness of breath and wheezing.   Cardiovascular: Negative for chest pain and palpitations.  Gastrointestinal: Positive for nausea and vomiting.       Nausea and vomiting after coughing episodes   Neurological: Positive for weakness. Negative for dizziness.    Medications: Patient's Medications  New Prescriptions   No medications on file  Previous Medications   ALBUTEROL (PROVENTIL HFA;VENTOLIN HFA) 108 (90 BASE) MCG/ACT INHALER    Inhale 2 puffs into the lungs every 6 (six) hours as needed. For wheezing or shortness of breath   ALPRAZOLAM (XANAX) 0.25 MG TABLET    Take 1 tablet (0.25 mg total) by mouth at bedtime. For sleep   FUROSEMIDE (LASIX) 40 MG TABLET    Take 40 mg by mouth daily.   GLIPIZIDE (GLUCOTROL) 5 MG TABLET    Take 5 mg by mouth 2 (two) times daily before a meal.   INSULIN GLARGINE  (LANTUS) 100 UNIT/ML INJECTION    Inject 17 Units into the skin at bedtime.    METFORMIN (GLUCOPHAGE) 1000 MG TABLET    Take 1,000 mg by mouth 2 (two) times daily with a meal.   METOPROLOL TARTRATE (LOPRESSOR) 25 MG TABLET    Take 25 mg by mouth 2 (two) times daily.   OXYCODONE (OXY IR/ROXICODONE) 5 MG IMMEDIATE RELEASE TABLET    Take one tablet by mouth every four hours as needed for pain; Take two tablet by mouth every four as needed for pain  Modified Medications   No medications on file  Discontinued Medications   No medications on file     Physical Exam:  Filed Vitals:   08/06/12 0922  BP: 118/66  Pulse: 80  Temp: 97.2 F (36.2 C)  Resp: 20    Physical Exam  Constitutional: He has a sickly appearance. No distress.  HENT:  Head: Normocephalic and atraumatic.  Eyes: Conjunctivae and EOM are normal. Pupils are equal, round, and reactive to light.  Neck: Normal range of motion. Neck supple.  Cardiovascular: Normal rate, regular rhythm and normal heart sounds.   Pulmonary/Chest: Effort normal. No respiratory distress. He has wheezes (throughout). He has rhonchi (throughout).  Abdominal: Soft. Bowel sounds are normal. He exhibits no distension. There is no tenderness.  Neurological: He displays weakness.  Skin: He is not diaphoretic.      Assessment/Plan Cough will get chest xray to rule out pneumonia will start levaquin  500 mg for 7 days, florastore bid for 7 days for GI health. Will have pt take mucinex DM 1 tablet q 12 hours for 7 days; duonebs q 6 hours for 7 days   Labs/tests ordered Cbc with diff, bmp, cxr

## 2012-08-10 ENCOUNTER — Encounter: Payer: Self-pay | Admitting: Nurse Practitioner

## 2012-08-10 ENCOUNTER — Non-Acute Institutional Stay (SKILLED_NURSING_FACILITY): Payer: Medicare Other | Admitting: Nurse Practitioner

## 2012-08-10 DIAGNOSIS — I10 Essential (primary) hypertension: Secondary | ICD-10-CM

## 2012-08-10 DIAGNOSIS — J449 Chronic obstructive pulmonary disease, unspecified: Secondary | ICD-10-CM

## 2012-08-10 DIAGNOSIS — L98499 Non-pressure chronic ulcer of skin of other sites with unspecified severity: Secondary | ICD-10-CM

## 2012-08-10 DIAGNOSIS — I739 Peripheral vascular disease, unspecified: Secondary | ICD-10-CM

## 2012-08-10 DIAGNOSIS — E1159 Type 2 diabetes mellitus with other circulatory complications: Secondary | ICD-10-CM

## 2012-08-10 NOTE — Progress Notes (Signed)
Patient ID: Daniel Tyler, male   DOB: Jan 22, 1941, 72 y.o.   MRN: [id number]  Nursing Home Location:  [facility]   Place of Service: SNF (31)   Chief Complaint: evaluation for discharge   HPI:  Patient is a 72 y.o. year old male with a H/o hyperlipidemia, hypertension, diabetes, coronary disease who on 07-14-2012 Dr. 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  Pt is at [facility] for rehab and is being seen today for discharge. Recently pt was seen with  decreased energy, worsening fatigue and malaise, increased congestion and cough. Was treated with scheduled breathing treatments and antibiotic and has responded great to treatment. Pt reports he has increased energy, not needing o2 and without any complaints of shortness of breath. Pt still remains with cough but reports this is improving. Pt with increased appetite. Does not complain of pain; no fever or chills. Staples removed to incisions on bilateral legs. Pt still conts with wound care to right toe amputation. Therapy reports patient is doing well with therapy, now stable to discharge home with home health.   Review of Systems:  Review of Systems  Constitutional: Negative for fever, chills and malaise/fatigue.  HENT: Negative.   Eyes: Negative.   Respiratory: Positive for cough. Negative for hemoptysis and shortness of breath.   Cardiovascular: Negative for chest pain, palpitations and leg swelling.  Gastrointestinal: Negative for heartburn, abdominal pain, diarrhea and constipation.  Genitourinary: Negative for dysuria, urgency and frequency.  Musculoskeletal: Negative for myalgias.  Skin: Negative.   Neurological: Negative for weakness.     Medications: Patient's Medications  New Prescriptions   No medications on file  Previous Medications   ALBUTEROL (PROVENTIL HFA;VENTOLIN HFA) 108  (90 BASE) MCG/ACT INHALER    Inhale 2 puffs into the lungs every 6 (six) hours as needed. For wheezing or shortness of breath   ALPRAZOLAM (XANAX) 0.25 MG TABLET    Take 1 tablet (0.25 mg total) by mouth at bedtime. For sleep   FUROSEMIDE (LASIX) 40 MG TABLET    Take 40 mg by mouth daily.   GLIPIZIDE (GLUCOTROL) 5 MG TABLET    Take 5 mg by mouth 2 (two) times daily before a meal.   INSULIN GLARGINE (LANTUS) 100 UNIT/ML INJECTION    Inject 17 Units into the skin at bedtime.    METFORMIN (GLUCOPHAGE) 1000 MG TABLET    Take 1,000 mg by mouth 2 (two) times daily with a meal.   METOPROLOL TARTRATE (LOPRESSOR) 25 MG TABLET    Take 25 mg by mouth 2 (two) times daily.  Modified Medications   No medications on file  Discontinued Medications   OXYCODONE (OXY IR/ROXICODONE) 5 MG IMMEDIATE RELEASE TABLET    Take one tablet by mouth every four hours as needed for pain; Take two tablet by mouth every four as needed for pain     Physical Exam:  Filed Vitals:   08/10/12 1654  BP: 121/66  Pulse: 68  Temp: 97.4 F (36.3 C)  Resp: 20    Physical Exam  Constitutional: He is oriented to person, place, and time. Vital signs are normal. No distress.  Thin male in NAD  Cardiovascular: Normal rate, regular rhythm and normal heart sounds.   Pulmonary/Chest: Effort normal and breath sounds normal. No respiratory distress.  Abdominal: Soft. Bowel sounds are normal.  Musculoskeletal: Normal range of motion. He exhibits no  edema and no tenderness.  Neurological: He is alert and oriented to person, place, and time.  Skin: Skin is warm and dry. He is not diaphoretic.  incision sites healing- no drainage or infection noted        Labs reviewed:   CMP with Estimated GFR       Result: 07/23/2012 1:26 PM    ( Status: F )            Sodium  131     L  135-145  mEq/L  SLN       Potassium  4.3        3.5-5.3  mEq/L  SLN       Chloride  98        96-112  mEq/L  SLN       CO2  23        19-32  mEq/L  SLN        Glucose  108     H  70-99  mg/dL  SLN       BUN  22        6-23  mg/dL  SLN       Creatinine  0.89        0.50-1.35  mg/dL  SLN       Bilirubin, Total  0.4        0.3-1.2  mg/dL  SLN       Alkaline Phosphatase  56        39-117  U/L  SLN       AST/SGOT  15        0-37  U/L  SLN       ALT/SGPT  10        0-53  U/L  SLN       Total Protein  6.5        6.0-8.3  g/dL  SLN       Albumin  3.4     L  3.5-5.2  g/dL  SLN       Calcium  8.6        8.4-10.5  mg/dL  SLN       Est GFR, African American  >89         mL/min  SLN       Est GFR, NonAfrican American  86         mL/min  SLN  C    CBC NO Diff (Complete Blood Count)       Result: 07/23/2012 11:28 AM    ( Status: F )            WBC  10.4        4.0-10.5  K/uL  SLN       RBC  3.14     L  4.22-5.81  MIL/uL  SLN       Hemoglobin  9.0     L  13.0-17.0  g/dL  SLN       Hematocrit  26.8     L  39.0-52.0  %  SLN       MCV  85.4        78.0-100.0  fL  SLN       MCH  28.7        26.0-34.0  pg  SLN       MCHC  33.6        30.0-36.0  g/dL  SLN       RDW  40.9  H  11.5-15.5  %  SLN       Platelet Count  590     H  150-400  K/uL  SLN      Hemoglobin A1C       Result: 07/23/2012 1:02 PM    ( Status: F )            Hemoglobin A1C  7.0     H  <5.7  %  SLN  C     Estimated Average Glucose  154     H  <117  mg/dL  SLN               Assessment/Plan 1.   Type II or unspecified type diabetes mellitus with peripheral circulatory disorders, uncontrolled(250.72) 250.72     Patient is stable; continue current regimen.   2.    Hypertension 401.9     Patient is stable; continue current regimen.   3.    COPD (chronic obstructive pulmonary disease) 496     With excerebration to cont Levaquin for 2 more days- staff to provide pt with  medication  4.   Peripheral vascular disease, unspecified 443.9      s/p Right Femoral Popliteal Bypass with Left Leg Nonreversed Greater Saphenous Vein. Pt has done well with inpt therapies and is now stable for discharge-will  need PT/OT/Nursing per home health. No DME needed. Rx written.  will need to follow up with PCP and surgeon within 2 weeks.   5.   Atherosclerosis of native arteries of the extremities with ulceration(440.23)   Nonhealing wound sp amputation- will need home health nursing to follow pt and for   wound care

## 2012-08-12 DIAGNOSIS — I798 Other disorders of arteries, arterioles and capillaries in diseases classified elsewhere: Secondary | ICD-10-CM

## 2012-08-12 DIAGNOSIS — Z4789 Encounter for other orthopedic aftercare: Secondary | ICD-10-CM

## 2012-08-12 DIAGNOSIS — E1159 Type 2 diabetes mellitus with other circulatory complications: Secondary | ICD-10-CM

## 2012-08-12 DIAGNOSIS — Z48812 Encounter for surgical aftercare following surgery on the circulatory system: Secondary | ICD-10-CM

## 2012-08-13 ENCOUNTER — Telehealth: Payer: Self-pay

## 2012-08-13 NOTE — Telephone Encounter (Signed)
Duncan Dull with [facility] is faxing over all orders for Occupational Therapy and wanted to get a verbal authorization 1 additional visit for next week.  [facility] number is [phone]

## 2012-08-13 NOTE — Telephone Encounter (Signed)
We have not been ordering this. Called Don to advise.

## 2012-08-19 ENCOUNTER — Ambulatory Visit: Payer: Self-pay | Admitting: Neurosurgery

## 2012-08-19 ENCOUNTER — Other Ambulatory Visit: Payer: Self-pay

## 2012-08-24 ENCOUNTER — Other Ambulatory Visit: Payer: Self-pay | Admitting: *Deleted

## 2012-08-24 DIAGNOSIS — I739 Peripheral vascular disease, unspecified: Secondary | ICD-10-CM

## 2012-08-24 DIAGNOSIS — Z48812 Encounter for surgical aftercare following surgery on the circulatory system: Secondary | ICD-10-CM

## 2012-09-08 ENCOUNTER — Encounter: Payer: Self-pay | Admitting: Vascular Surgery

## 2012-09-09 ENCOUNTER — Encounter (INDEPENDENT_AMBULATORY_CARE_PROVIDER_SITE_OTHER): Payer: Medicare Other | Admitting: Vascular Surgery

## 2012-09-09 ENCOUNTER — Ambulatory Visit (INDEPENDENT_AMBULATORY_CARE_PROVIDER_SITE_OTHER): Payer: Medicare Other | Admitting: Vascular Surgery

## 2012-09-09 ENCOUNTER — Encounter: Payer: Self-pay | Admitting: Vascular Surgery

## 2012-09-09 ENCOUNTER — Other Ambulatory Visit (INDEPENDENT_AMBULATORY_CARE_PROVIDER_SITE_OTHER): Payer: Medicare Other | Admitting: Vascular Surgery

## 2012-09-09 VITALS — BP 107/62 | HR 65 | Temp 97.9°F | Resp 16 | Ht 69.0 in | Wt 144.0 lb

## 2012-09-09 DIAGNOSIS — Z01810 Encounter for preprocedural cardiovascular examination: Secondary | ICD-10-CM

## 2012-09-09 DIAGNOSIS — I6521 Occlusion and stenosis of right carotid artery: Secondary | ICD-10-CM

## 2012-09-09 DIAGNOSIS — I739 Peripheral vascular disease, unspecified: Secondary | ICD-10-CM

## 2012-09-09 DIAGNOSIS — Z48812 Encounter for surgical aftercare following surgery on the circulatory system: Secondary | ICD-10-CM

## 2012-09-09 DIAGNOSIS — I6529 Occlusion and stenosis of unspecified carotid artery: Secondary | ICD-10-CM

## 2012-09-09 NOTE — Progress Notes (Signed)
[facility] [place] HISTORY AND PHYSICAL   CC:  F/u to surgery and f/u carotid duplex  Daniel Davenport, MD  HPI: This is a 72 y.o. male  Who underwent a femoral to popliteal bypass with contralateral vein 07/14/12 as well as a right 4th toe amputation.  He has also had a right CEA in May 2013.   He states he has been doing well since discharge.  He denies any claudication and states that he has a place on his groin that he has been putting neosporin on.  He is on medication for his diabetes and medication for his HTN.  Past Medical History  Diagnosis Date  . Enlarged heart   . Hyperlipidemia   . Carotid stenosis   . Coronary artery disease   . Pneumonia 2013    hx of  . Anemia   . Diabetes mellitus     takes Glipizide and Metformin daily;Lantus nightly  . Myocardial infarction 2013  . Weakness of right leg   . Skin spots-aging   . Depression     bc of foot pain but no meds required   Past Surgical History  Procedure Laterality Date  . Cardiac catheterization  2013  . Endarterectomy  07/01/2011    Procedure: ENDARTERECTOMY CAROTID;  Surgeon: Sherren Kerns, MD;  Location: [facility] OR;  Service: Vascular;  Laterality: Right;  . Carotid endarterectomy  07/01/11    Right CEA  . Abdominal aortagram    . Coronary artery bypass graft  07/01/2011    Procedure: CORONARY ARTERY BYPASS GRAFTING (CABG);  Surgeon: Loreli Slot, MD;  Location: [facility] OR;  Service: Open Heart Surgery;  Laterality: N/A;  . Colonoscopy    . Femoral-popliteal bypass graft Right 07/14/2012    Procedure: BYPASS GRAFT FEMORAL-POPLITEAL ARTERY;  Surgeon: Sherren Kerns, MD;  Location: [facility] OR;  Service: Vascular;  Laterality: Right;  Right Femoral Popliteal Bypass with Left Leg Nonreversed Greater Saphenous Vein  . Amputation Right 07/14/2012    Procedure: AMPUTATION DIGIT;  Surgeon: Sherren Kerns, MD;  Location: [facility] OR;  Service: Vascular;  Laterality: Right;  Amputation Right Fourth Toe  .  Intraoperative arteriogram Right 07/14/2012    Procedure: INTRA OPERATIVE ARTERIOGRAM;  Surgeon: Sherren Kerns, MD;  Location: [facility] OR;  Service: Vascular;  Laterality: Right;    No Known Allergies  Current Outpatient Prescriptions  Medication Sig Dispense Refill  . albuterol (PROVENTIL HFA;VENTOLIN HFA) 108 (90 BASE) MCG/ACT inhaler Inhale 2 puffs into the lungs every 6 (six) hours as needed. For wheezing or shortness of breath      . ALPRAZolam (XANAX) 0.25 MG tablet Take 1 tablet (0.25 mg total) by mouth at bedtime. For sleep  30 tablet  3  . furosemide (LASIX) 40 MG tablet Take 40 mg by mouth daily.      Marland Kitchen glipiZIDE (GLUCOTROL) 5 MG tablet Take 5 mg by mouth 2 (two) times daily before a meal.      . insulin glargine (LANTUS) 100 UNIT/ML injection Inject 17 Units into the skin at bedtime.       . metFORMIN (GLUCOPHAGE) 1000 MG tablet Take 1,000 mg by mouth 2 (two) times daily with a meal.      . metoprolol tartrate (LOPRESSOR) 25 MG tablet Take 25 mg by mouth 2 (two) times daily.       No current facility-administered medications for this visit.    Family History  Problem Relation Age of Onset  . Heart  disease Father   . Cancer Brother   . Diabetes Brother   . Diabetes Daughter   . Anesthesia problems Neg Hx   . Hypotension Neg Hx   . Malignant hyperthermia Neg Hx   . Pseudochol deficiency Neg Hx     History   Social History  . Marital Status: Single    Spouse Name: N/A    Number of Children: N/A  . Years of Education: N/A   Occupational History  . Not on file.   Social History Main Topics  . Smoking status: Former Smoker -- 1.00 packs/day for 20 years    Types: Cigarettes    Quit date: 02/24/1978  . Smokeless tobacco: Never Used  . Alcohol Use: No  . Drug Use: No  . Sexually Active: No   Other Topics Concern  . Not on file   Social History Narrative  . No narrative on file     ROS: [x]  Positive   [ ]  Negative   [ ]  All sytems reviewed and are  negative  Cardiovascular: []  chest pain/pressure []  palpitations []  SOB lying flat []  DOE []  pain in legs while walking []  pain in feet when lying flat []  hx of DVT []  hx of phlebitis []  swelling in legs []  varicose veins  Pulmonary: []  productive cough []  asthma []  wheezing  Neurologic: []  weakness in []  arms []  legs []  numbness in []  arms []  legs [] difficulty speaking or slurred speech []  temporary loss of vision in one eye []  dizziness  Hematologic: []  bleeding problems []  problems with blood clotting easily  GI []  vomiting blood []  blood in stool  GU: []  burning with urination []  blood in urine  Psychiatric: []  hx of major depression  Integumentary: []  rashes []  ulcers  Constitutional: []  fever []  chills   PHYSICAL EXAMINATION:  Filed Vitals:   09/09/12 1438  BP: 107/62  Pulse: 65  Temp: 97.9 F (36.6 C)  Resp:    Body mass index is 21.26 kg/(m^2).  General:  WDWN in NAD Gait: Slow; unassisted  HENT: WNL, normocephalic Eyes: Pupils equal Pulmonary: normal non-labored breathing , without Rales, rhonchi,  wheezing Cardiac: RRR Skin: without rashes, without ulcers BLE scars are healing nicely Vascular Exam/Pulses:right popliteal pulse is difficult to palpate Extremities: without ischemic changes, without Gangrene , without cellulitis; without open wounds; right 4th toe amputation site is healing nicely.  Right groin incision is healed.  There is a small hematoma or possibly lymph nodes present Musculoskeletal: no muscle wasting or atrophy  Neurologic: A&O X 3; Appropriate Affect ; SENSATION: normal; MOTOR FUNCTION:  moving all extremities equally. Speech is fluent/normal   Non-Invasive Vascular Imaging:    Carotid duplex scan 09/09/12: 1.  Right ICA is patent with hx of CEA, no hyperplasia or hemodynamically significant plaque 2.  Left ICA stenosis is present in the 40%-59% range 3.  Right ECA patent.  Left ECA stenosis is present 4.   Bilateral vertebral arteries are patent and antegrade -there is an increase in velocities on the right and stable on the left since previous study on 02/13/12.  Arterial duplex of bypass graft 09/09/12:  Vein graft is patent without area of stenosis.  ABI's 09/09/12: Right:  0.69 Left:  0.54    ASSESSMENT/PLAN: 72 y.o. male s/p right femoral to popliteal bypass with contralateral vein and right 4th toe amputation 07/14/12 and right CEA  07/02/11.  -pt is doing well without complaints -his bypass graft is patent without stenosis; we will  have him return in 3 months for evaluation of the bypass graft with arterial duplex and ABI's. -his carotid duplex has had some increase in velocities on the left, but this is still in the range of 40-59% and the pt is asymptomatic.  We will have him return in a year to f/u with a carotid duplex scan. -will refer pt to podiatrist for toe nail maintenance; it is expressed to the pt the importance of having someone else trim his toenails and he expressed understanding.   Doreatha Massed, PA-C [facility] [phone]  Clinic MD:  Pt seen and examined in conjunction with Dr. Darrick Penna   History exam and details of plan as above Had marginal vein at time of bypass but velocities reasonable today and toe amputation healed.  No claudication or rest pain Repeat duplex 3 months  Fabienne Bruns, MD [facility] [place] Office: [phone] Pager: [phone]

## 2012-09-11 ENCOUNTER — Other Ambulatory Visit: Payer: Self-pay | Admitting: Internal Medicine

## 2012-09-27 ENCOUNTER — Other Ambulatory Visit: Payer: Self-pay | Admitting: *Deleted

## 2012-09-27 DIAGNOSIS — I739 Peripheral vascular disease, unspecified: Secondary | ICD-10-CM

## 2012-09-27 DIAGNOSIS — Z48812 Encounter for surgical aftercare following surgery on the circulatory system: Secondary | ICD-10-CM

## 2012-10-19 ENCOUNTER — Other Ambulatory Visit: Payer: Self-pay | Admitting: Internal Medicine

## 2012-10-21 ENCOUNTER — Other Ambulatory Visit: Payer: Self-pay | Admitting: Internal Medicine

## 2012-11-11 ENCOUNTER — Other Ambulatory Visit: Payer: Self-pay | Admitting: *Deleted

## 2012-12-15 ENCOUNTER — Encounter: Payer: Self-pay | Admitting: Vascular Surgery

## 2012-12-16 ENCOUNTER — Inpatient Hospital Stay (HOSPITAL_COMMUNITY): Admission: RE | Admit: 2012-12-16 | Payer: Self-pay | Source: Ambulatory Visit

## 2012-12-16 ENCOUNTER — Ambulatory Visit: Payer: Self-pay | Admitting: Vascular Surgery

## 2013-02-28 IMAGING — CR DG CHEST 1V PORT
1 series · 1 of 1 positions shown · non-contrast
Comparison: None.

CLINICAL DATA: Right-sided chest pain

PORTABLE CHEST - 1 VIEW

[view not recorded]
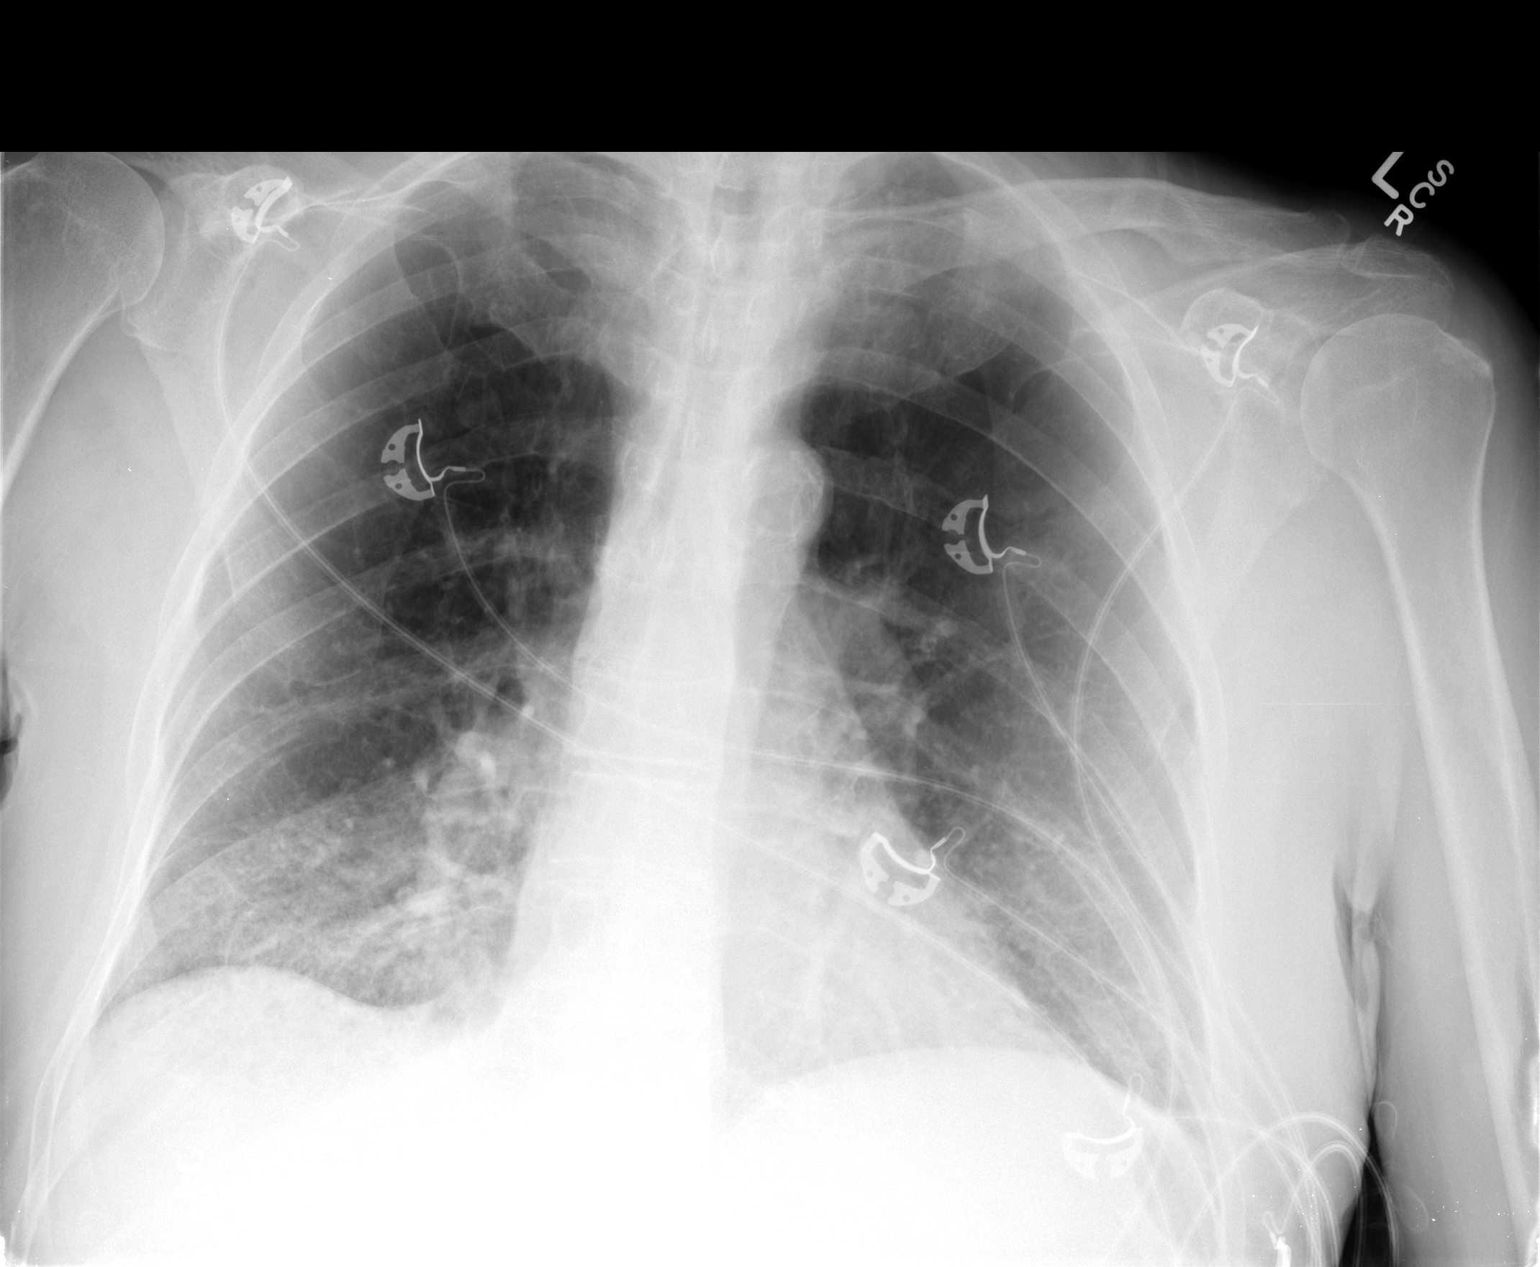

[1 of 1 positions shown; findings below may reference images not displayed]

FINDINGS: Right lower lobe airspace disease, possible pneumonia.

COPD with hyperinflation.  Apical emphysema.  No pleural effusion.
Vascularity is normal.
IMPRESSION: COPD.  Right lower lobe infiltrate, suspicious for pneumonia.

## 2013-03-01 IMAGING — CR DG CHEST 1V PORT
1 series · 1 of 1 positions shown · non-contrast
Comparison: 04/14/2011.

CLINICAL DATA: Shortness of breath.  Clinical concern for fluid
overload.

PORTABLE CHEST - 1 VIEW

[AP]
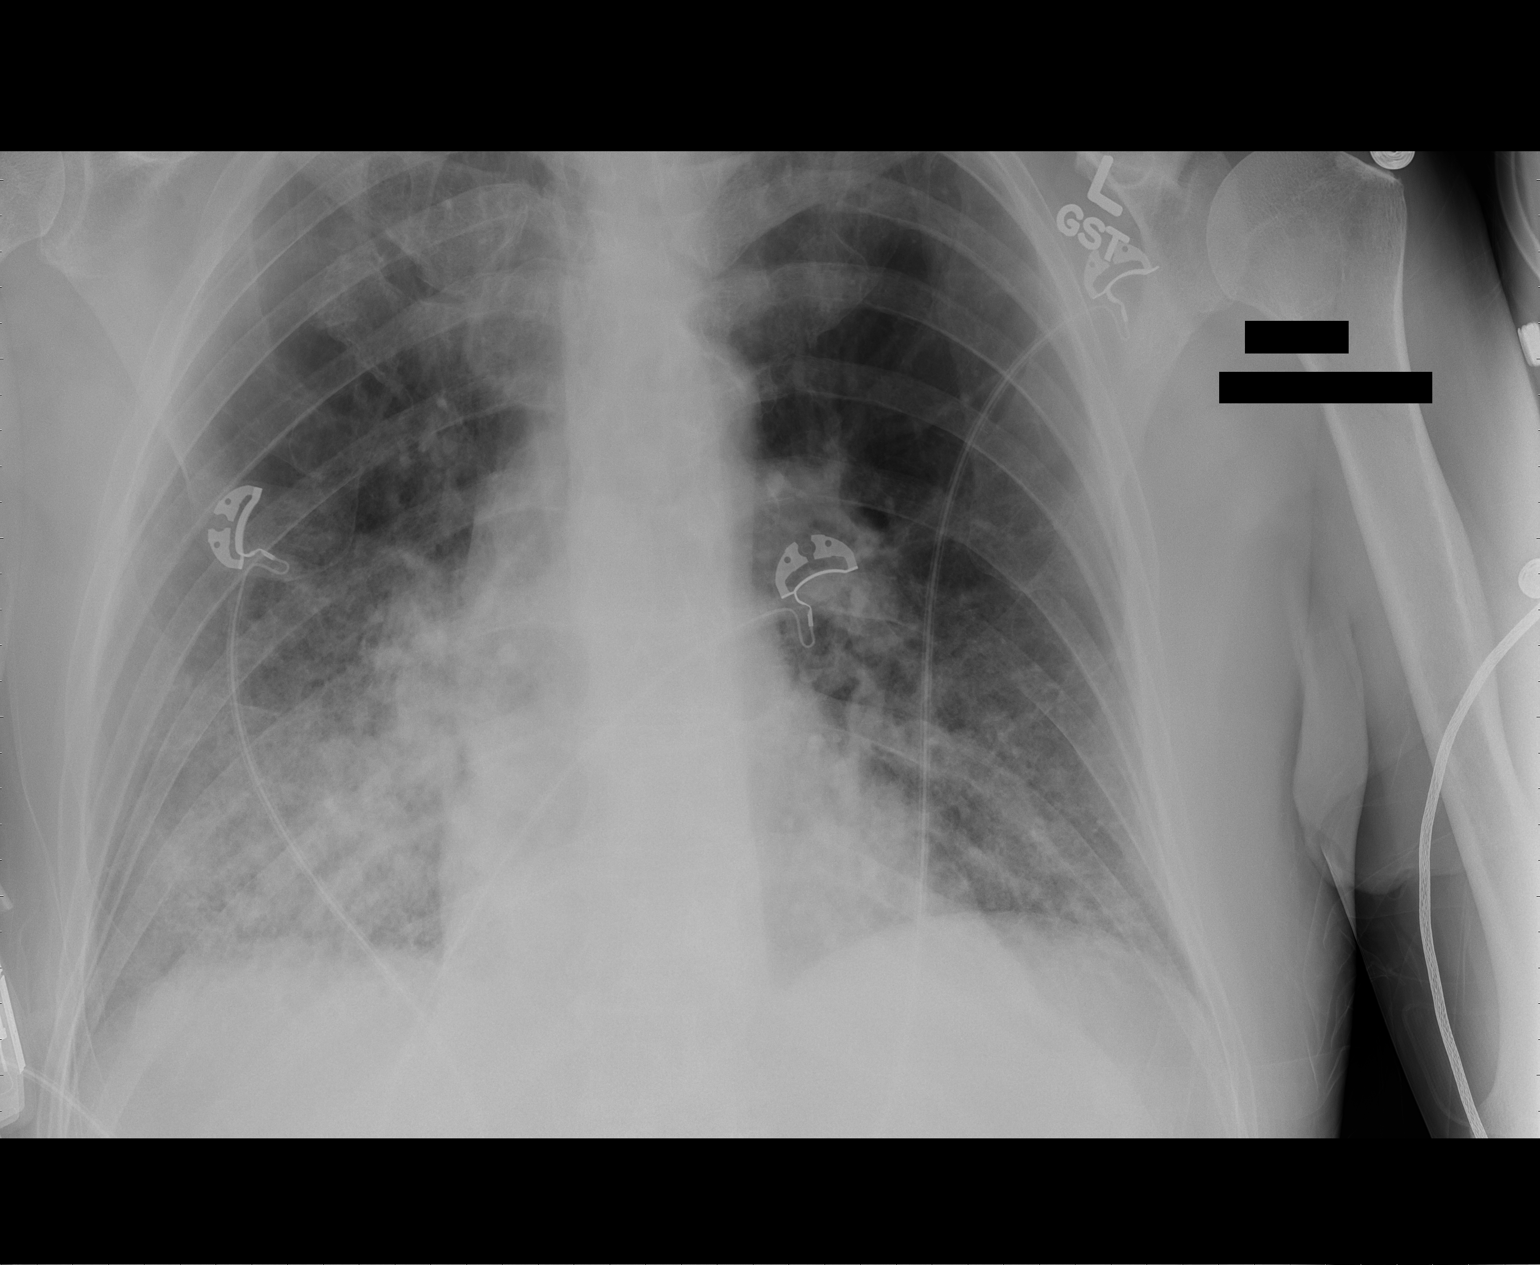

[1 of 1 positions shown; findings below may reference images not displayed]

FINDINGS: Significant increase in right lower lobe airspace opacity
with interval mild left basilar airspace opacity.  Normal sized
heart.  No pleural fluid.  Unremarkable bones.
IMPRESSION: 1.  Worsening right lower lobe pneumonia.
2.  Interval probable pneumonia at the left lung base.

## 2013-03-03 IMAGING — CR DG CHEST 1V PORT
2 series · 2 of 2 positions shown · non-contrast
Comparison: 04/16/2011.

CLINICAL DATA: Reintubated.

PORTABLE CHEST - 1 VIEW

[AP (1 of 2)]
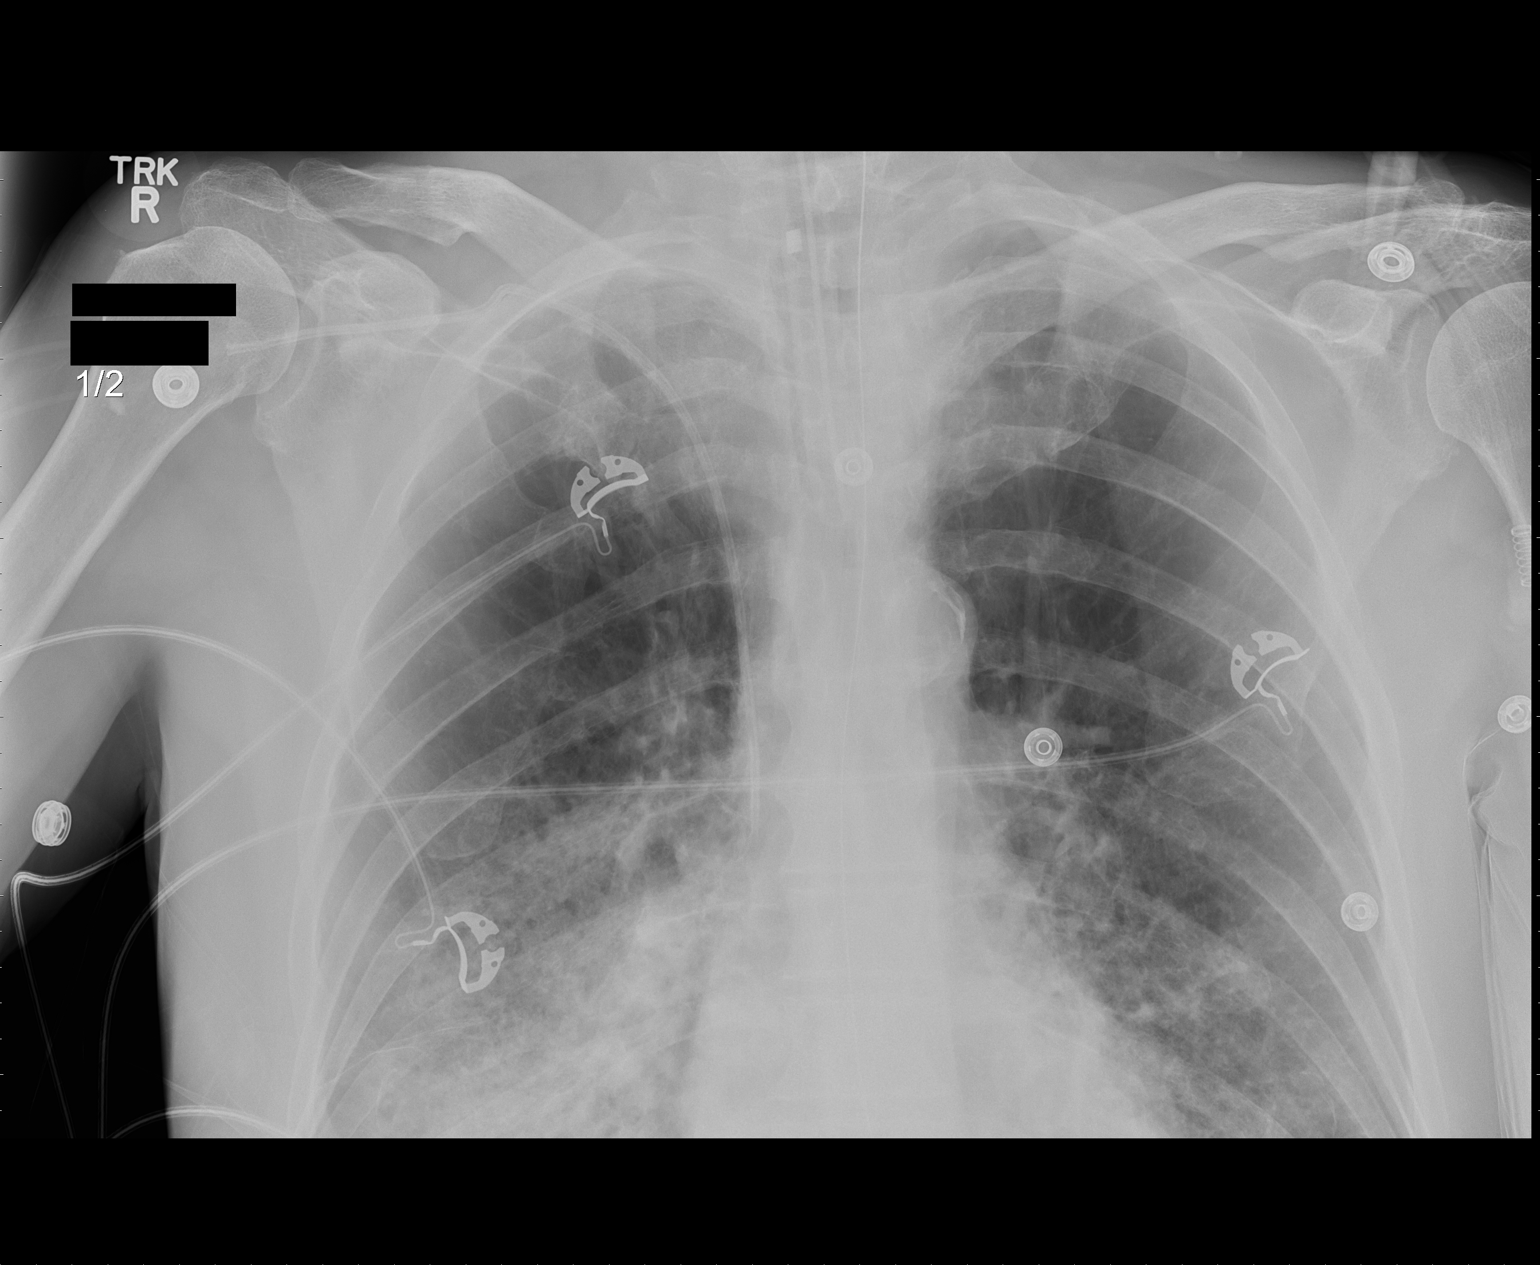

[AP (2 of 2)]
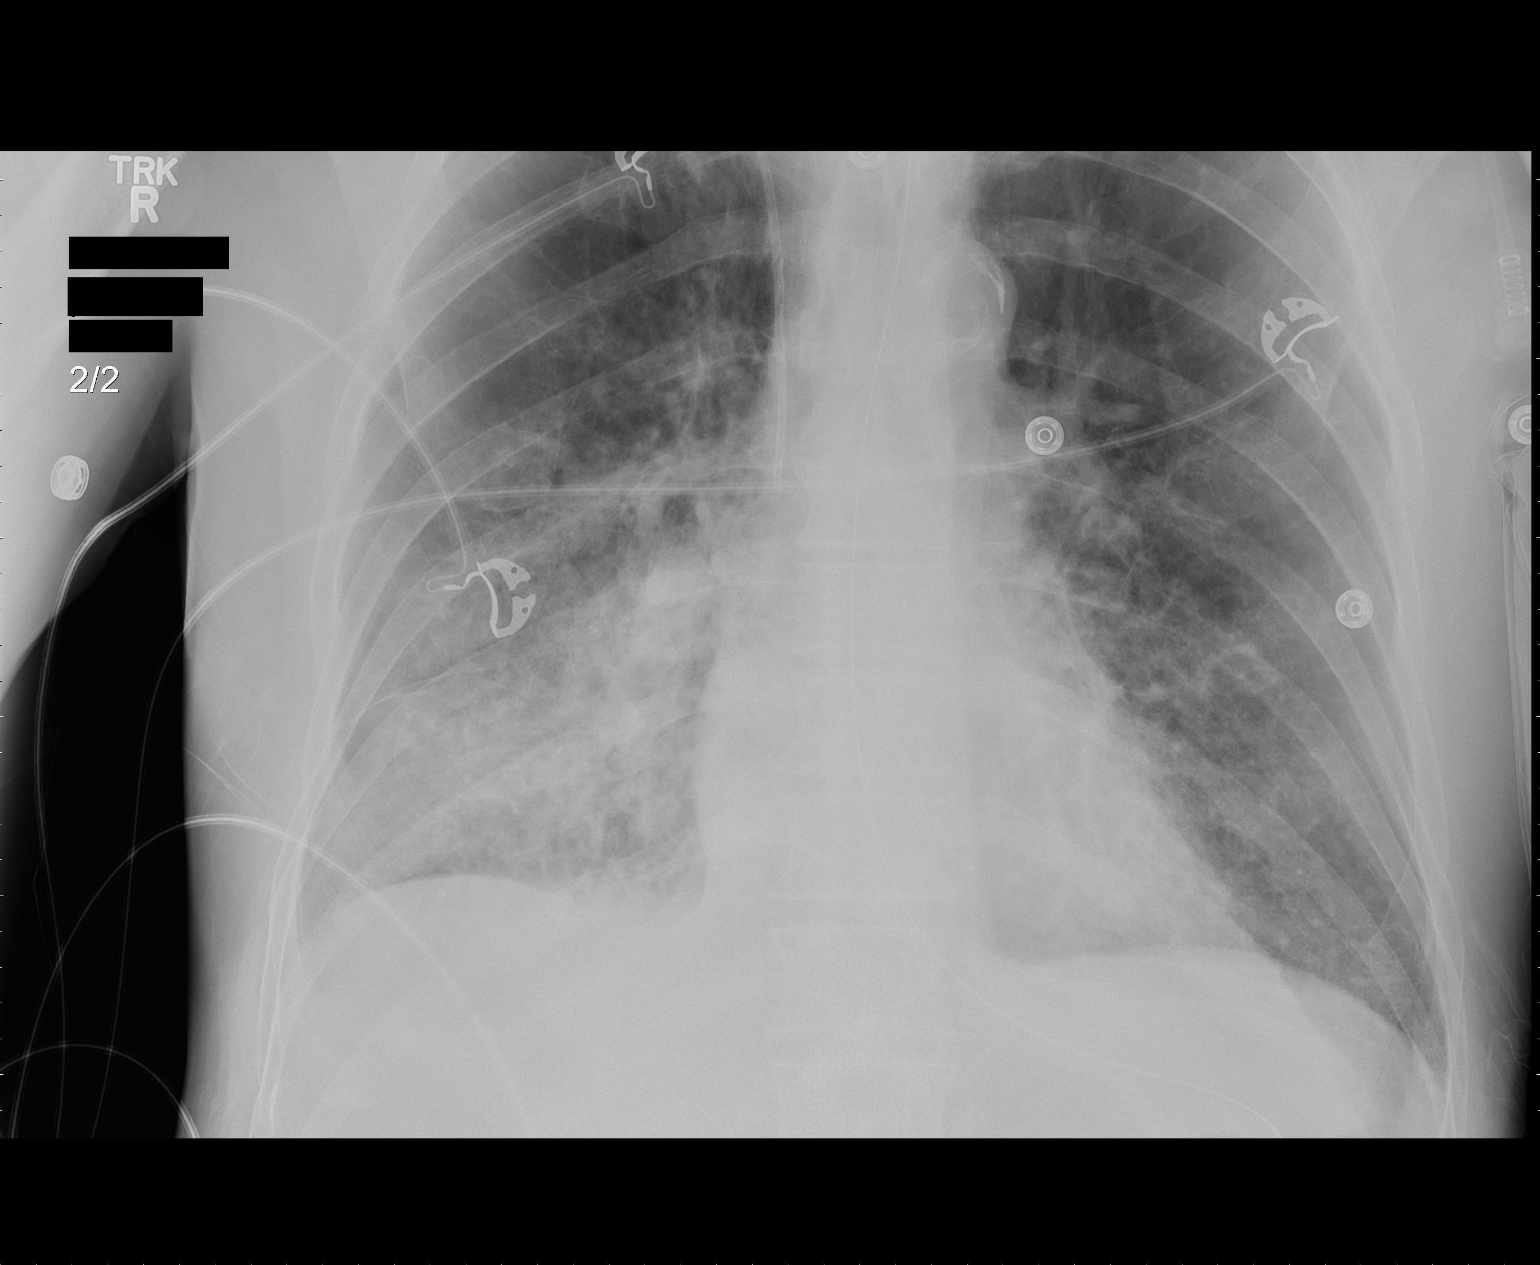

[2 of 2 positions shown; findings below may reference images not displayed]

FINDINGS: Basilar air space disease greater on the right.  Findings
suspicious for infectious infiltrate.  Asymmetric pulmonary edema
is a secondary less likely consideration.

Endotracheal tube tip 6 cm above the carina.  Right central line
tip mid superior vena cava level.  No gross pneumothorax.
Nasogastric tube courses below the diaphragm.  The tip is not
included on this exam..

Heart size within normal limits.  Calcified aorta.
IMPRESSION: Basilar air space disease greater on the right.  Findings
suspicious for infectious infiltrate. Appearance without
significant change.

## 2013-03-04 IMAGING — CR DG CHEST 1V PORT
1 series · 1 of 1 positions shown · non-contrast
Comparison: Portable chest x-ray of 04/17/2011

CLINICAL DATA: Evaluate endotracheal tube position

PORTABLE CHEST - 1 VIEW

[AP]
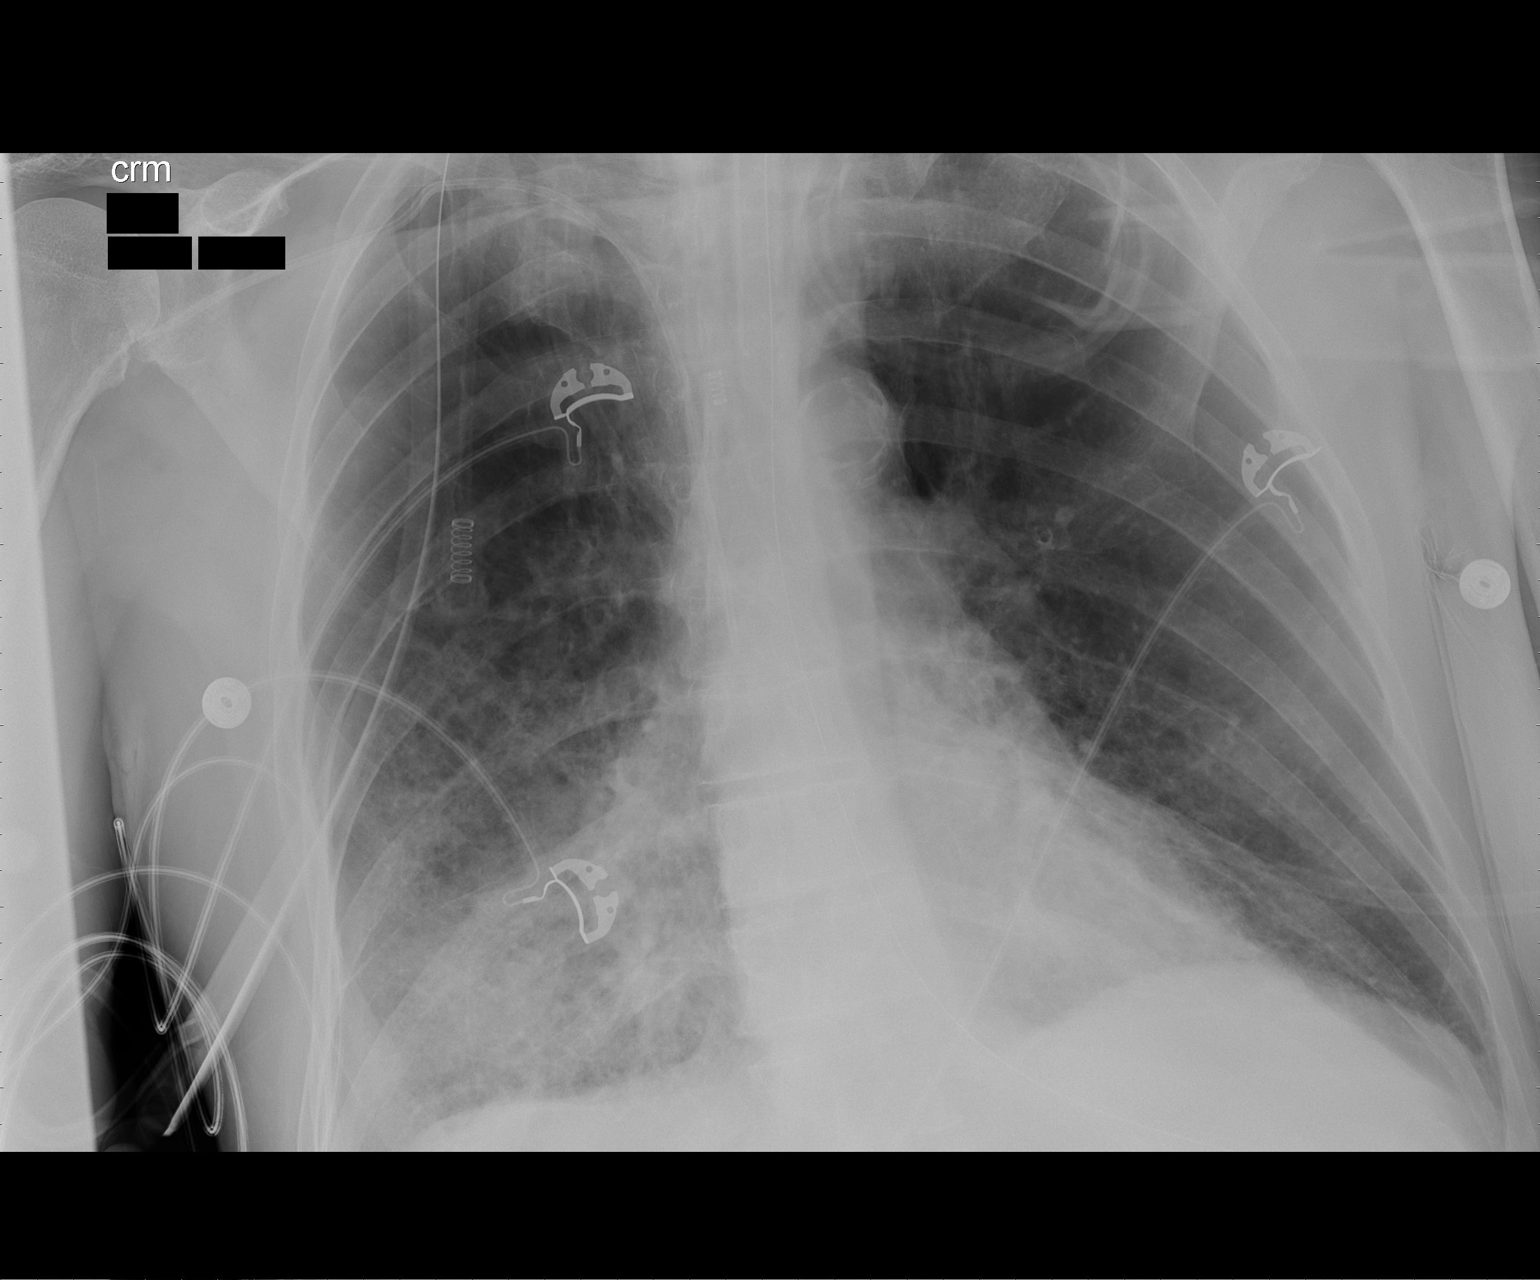

[1 of 1 positions shown; findings below may reference images not displayed]

FINDINGS: The tip of the endotracheal tube is approximally 5.7 cm
above the carina.  A right IJ central venous line remains with the
tip in the mid lower SVC.  No pneumothorax is seen. Opacities
remain at the lung bases right greater than left most consistent
with pneumonia.  Heart size is stable.
IMPRESSION: 1.  Bibasilar opacities right greater than right suspicious for
pneumonia.
2.  Endotracheal tip 5.2 cm above the carina.

## 2013-03-06 ENCOUNTER — Other Ambulatory Visit: Payer: Self-pay | Admitting: Internal Medicine

## 2013-03-07 ENCOUNTER — Other Ambulatory Visit: Payer: Self-pay | Admitting: *Deleted

## 2013-03-29 ENCOUNTER — Other Ambulatory Visit: Payer: Self-pay | Admitting: Internal Medicine

## 2013-04-12 ENCOUNTER — Other Ambulatory Visit: Payer: Self-pay | Admitting: Vascular Surgery

## 2013-04-12 DIAGNOSIS — I6529 Occlusion and stenosis of unspecified carotid artery: Secondary | ICD-10-CM

## 2013-08-16 ENCOUNTER — Other Ambulatory Visit: Payer: Self-pay | Admitting: Internal Medicine

## 2013-09-14 ENCOUNTER — Encounter: Payer: Self-pay | Admitting: Family

## 2013-09-15 ENCOUNTER — Ambulatory Visit: Payer: Self-pay | Admitting: Family

## 2013-09-15 ENCOUNTER — Other Ambulatory Visit (HOSPITAL_COMMUNITY): Payer: Self-pay

## 2013-12-29 ENCOUNTER — Other Ambulatory Visit (HOSPITAL_COMMUNITY): Payer: Self-pay

## 2013-12-29 ENCOUNTER — Ambulatory Visit: Payer: Self-pay | Admitting: Vascular Surgery

## 2013-12-29 ENCOUNTER — Encounter (HOSPITAL_COMMUNITY): Payer: Self-pay

## 2014-01-10 ENCOUNTER — Encounter: Payer: Self-pay | Admitting: Vascular Surgery

## 2014-01-11 ENCOUNTER — Ambulatory Visit: Payer: Self-pay | Admitting: Vascular Surgery

## 2014-02-02 ENCOUNTER — Encounter (HOSPITAL_COMMUNITY): Payer: Self-pay | Admitting: Cardiology

## 2014-07-19 NOTE — Progress Notes (Signed)
This encounter was created in error - please disregard.

## 2014-07-31 NOTE — Progress Notes (Signed)
This encounter was created in error - please disregard.

## 2014-08-04 ENCOUNTER — Encounter: Payer: Self-pay | Admitting: Internal Medicine

## 2015-07-20 ENCOUNTER — Observation Stay (HOSPITAL_COMMUNITY)
Admission: EM | Admit: 2015-07-20 | Discharge: 2015-07-21 | Disposition: A | Discharge status: Home Health | Payer: Medicare Other | Attending: Internal Medicine | Admitting: Internal Medicine

## 2015-07-20 ENCOUNTER — Encounter (HOSPITAL_COMMUNITY): Payer: Self-pay | Admitting: *Deleted

## 2015-07-20 ENCOUNTER — Emergency Department (HOSPITAL_COMMUNITY): Payer: Medicare Other

## 2015-07-20 DIAGNOSIS — E785 Hyperlipidemia, unspecified: Secondary | ICD-10-CM | POA: Diagnosis present

## 2015-07-20 DIAGNOSIS — I252 Old myocardial infarction: Secondary | ICD-10-CM | POA: Insufficient documentation

## 2015-07-20 DIAGNOSIS — E860 Dehydration: Secondary | ICD-10-CM | POA: Insufficient documentation

## 2015-07-20 DIAGNOSIS — E119 Type 2 diabetes mellitus without complications: Secondary | ICD-10-CM | POA: Diagnosis not present

## 2015-07-20 DIAGNOSIS — I110 Hypertensive heart disease with heart failure: Secondary | ICD-10-CM | POA: Diagnosis not present

## 2015-07-20 DIAGNOSIS — J449 Chronic obstructive pulmonary disease, unspecified: Secondary | ICD-10-CM | POA: Diagnosis not present

## 2015-07-20 DIAGNOSIS — E11649 Type 2 diabetes mellitus with hypoglycemia without coma: Secondary | ICD-10-CM | POA: Diagnosis not present

## 2015-07-20 DIAGNOSIS — Z7984 Long term (current) use of oral hypoglycemic drugs: Secondary | ICD-10-CM | POA: Insufficient documentation

## 2015-07-20 DIAGNOSIS — D649 Anemia, unspecified: Secondary | ICD-10-CM | POA: Diagnosis not present

## 2015-07-20 DIAGNOSIS — I10 Essential (primary) hypertension: Secondary | ICD-10-CM | POA: Diagnosis not present

## 2015-07-20 DIAGNOSIS — Z87891 Personal history of nicotine dependence: Secondary | ICD-10-CM | POA: Diagnosis not present

## 2015-07-20 DIAGNOSIS — Z955 Presence of coronary angioplasty implant and graft: Secondary | ICD-10-CM | POA: Diagnosis not present

## 2015-07-20 DIAGNOSIS — I739 Peripheral vascular disease, unspecified: Secondary | ICD-10-CM

## 2015-07-20 DIAGNOSIS — E872 Acidosis: Secondary | ICD-10-CM | POA: Diagnosis not present

## 2015-07-20 DIAGNOSIS — I5022 Chronic systolic (congestive) heart failure: Secondary | ICD-10-CM | POA: Diagnosis not present

## 2015-07-20 DIAGNOSIS — R55 Syncope and collapse: Principal | ICD-10-CM | POA: Diagnosis present

## 2015-07-20 DIAGNOSIS — I779 Disorder of arteries and arterioles, unspecified: Secondary | ICD-10-CM | POA: Diagnosis not present

## 2015-07-20 DIAGNOSIS — R531 Weakness: Secondary | ICD-10-CM | POA: Insufficient documentation

## 2015-07-20 DIAGNOSIS — I2510 Atherosclerotic heart disease of native coronary artery without angina pectoris: Secondary | ICD-10-CM | POA: Insufficient documentation

## 2015-07-20 DIAGNOSIS — Z951 Presence of aortocoronary bypass graft: Secondary | ICD-10-CM

## 2015-07-20 DIAGNOSIS — Z79899 Other long term (current) drug therapy: Secondary | ICD-10-CM | POA: Insufficient documentation

## 2015-07-20 DIAGNOSIS — E871 Hypo-osmolality and hyponatremia: Secondary | ICD-10-CM | POA: Diagnosis not present

## 2015-07-20 LAB — CBC
HEMATOCRIT: 31.1 % — AB (ref 39.0–52.0)
Hemoglobin: 10.2 g/dL — ABNORMAL LOW (ref 13.0–17.0)
MCH: 28.9 pg (ref 26.0–34.0)
MCHC: 32.8 g/dL (ref 30.0–36.0)
MCV: 88.1 fL (ref 78.0–100.0)
PLATELETS: 348 10*3/uL (ref 150–400)
RBC: 3.53 MIL/uL — AB (ref 4.22–5.81)
RDW: 14.3 % (ref 11.5–15.5)
WBC: 8.4 10*3/uL (ref 4.0–10.5)

## 2015-07-20 LAB — BASIC METABOLIC PANEL
Anion gap: 13 (ref 5–15)
BUN: 21 mg/dL — ABNORMAL HIGH (ref 6–20)
CHLORIDE: 100 mmol/L — AB (ref 101–111)
CO2: 19 mmol/L — ABNORMAL LOW (ref 22–32)
Calcium: 9 mg/dL (ref 8.9–10.3)
Creatinine, Ser: 1.46 mg/dL — ABNORMAL HIGH (ref 0.61–1.24)
GFR, EST AFRICAN AMERICAN: 53 mL/min — AB (ref >60)
GFR, EST NON AFRICAN AMERICAN: 46 mL/min — AB (ref >60)
Glucose, Bld: 161 mg/dL — ABNORMAL HIGH (ref 65–99)
POTASSIUM: 4.4 mmol/L (ref 3.5–5.1)
SODIUM: 132 mmol/L — AB (ref 135–145)

## 2015-07-20 LAB — URINALYSIS, ROUTINE W REFLEX MICROSCOPIC
Bilirubin Urine: NEGATIVE
Glucose, UA: NEGATIVE mg/dL
HGB URINE DIPSTICK: NEGATIVE
Ketones, ur: NEGATIVE mg/dL
LEUKOCYTES UA: NEGATIVE
NITRITE: NEGATIVE
PROTEIN: NEGATIVE mg/dL
Specific Gravity, Urine: 1.012 (ref 1.005–1.030)
pH: 5 (ref 5.0–8.0)

## 2015-07-20 LAB — I-STAT TROPONIN, ED: Troponin i, poc: 0.02 ng/mL (ref 0.00–0.08)

## 2015-07-20 MED ORDER — SODIUM CHLORIDE 0.9 % IV SOLN
INTRAVENOUS | Status: DC
  Administered 2015-07-21: via INTRAVENOUS

## 2015-07-20 MED ORDER — ACETAMINOPHEN 650 MG RE SUPP: 650.0000 mg | Freq: Four times a day (QID) | RECTAL | Status: DC | PRN

## 2015-07-20 MED ORDER — ONDANSETRON HCL 4 MG PO TABS: 4.0000 mg | ORAL_TABLET | Freq: Four times a day (QID) | ORAL | Status: DC | PRN

## 2015-07-20 MED ORDER — ENOXAPARIN SODIUM 40 MG/0.4ML ~~LOC~~ SOLN
40.0000 mg | Freq: Every day | SUBCUTANEOUS | Status: DC
  Administered 2015-07-21: 40 mg via SUBCUTANEOUS
  Filled 2015-07-20: qty 0.4

## 2015-07-20 MED ORDER — METOPROLOL TARTRATE 12.5 MG HALF TABLET
12.5000 mg | ORAL_TABLET | Freq: Two times a day (BID) | ORAL | Status: DC
  Administered 2015-07-21 (×2): 12.5 mg via ORAL
  Filled 2015-07-20 (×2): qty 1

## 2015-07-20 MED ORDER — ONDANSETRON HCL 4 MG/2ML IJ SOLN: 4.0000 mg | Freq: Four times a day (QID) | INTRAMUSCULAR | Status: DC | PRN

## 2015-07-20 MED ORDER — ALBUTEROL SULFATE (2.5 MG/3ML) 0.083% IN NEBU: 2.5000 mg | INHALATION_SOLUTION | RESPIRATORY_TRACT | Status: DC | PRN

## 2015-07-20 MED ORDER — SIMVASTATIN 40 MG PO TABS
40.0000 mg | ORAL_TABLET | Freq: Every day | ORAL | Status: DC
  Administered 2015-07-21: 40 mg via ORAL
  Filled 2015-07-20 (×2): qty 1

## 2015-07-20 MED ORDER — SODIUM CHLORIDE 0.9 % IV BOLUS (SEPSIS)
1000.0000 mL | Freq: Once | INTRAVENOUS | Status: AC
  Administered 2015-07-20: 1000 mL via INTRAVENOUS

## 2015-07-20 MED ORDER — FENOFIBRATE 160 MG PO TABS
160.0000 mg | ORAL_TABLET | Freq: Every day | ORAL | Status: DC
  Administered 2015-07-21: 160 mg via ORAL
  Filled 2015-07-20: qty 1

## 2015-07-20 MED ORDER — ACETAMINOPHEN 325 MG PO TABS: 650.0000 mg | ORAL_TABLET | Freq: Four times a day (QID) | ORAL | Status: DC | PRN

## 2015-07-20 MED ORDER — SODIUM CHLORIDE 0.9% FLUSH
3.0000 mL | Freq: Two times a day (BID) | INTRAVENOUS | Status: DC
  Administered 2015-07-21: 3 mL via INTRAVENOUS

## 2015-07-20 NOTE — ED Provider Notes (Signed)
CSN: [id number]     Arrival date & time 07/20/15  1921 History   First MD Initiated Contact with Patient 07/20/15 1923     Chief Complaint  Patient presents with  . Near Syncope    The history is provided by the patient and a friend.  Past mental history of coronary artery disease, HLD, carotid stenosis s/p endartectomy who presents to ED for evaluation of syncope versus near syncope. He was in line at [facility] when he had sudden feeling of warmth diaphoresis nausea the sensation is about a passed out. He then slipped over into a chair. Patient is unsure if he lost consciousness but that he might of. This episode lasted for a few seconds and then he returned to his baseline mental status. Otherwise was feeling well throughout the day without any preceding symptoms. He denies any chest pain, shortness of breath, nausea, vomiting diarrhea, change in urinary output, abd pain. No prior hx of exertional syncope.  No hemoptysis. No hx of PE.    Past Medical History  Diagnosis Date  . Enlarged heart   . Hyperlipidemia   . Carotid stenosis   . Coronary artery disease   . Pneumonia 2013    hx of  . Anemia   . Diabetes mellitus     takes Glipizide and Metformin daily;Lantus nightly  . Myocardial infarction (HCC) 2013  . Weakness of right leg   . Skin spots-aging   . Depression     bc of foot pain but no meds required   Past Surgical History  Procedure Laterality Date  . Cardiac catheterization  2013  . Endarterectomy  07/01/2011    Procedure: ENDARTERECTOMY CAROTID;  Surgeon: Sherren Kerns, MD;  Location: [facility] OR;  Service: Vascular;  Laterality: Right;  . Carotid endarterectomy  07/01/11    Right CEA  . Abdominal aortagram    . Coronary artery bypass graft  07/01/2011    Procedure: CORONARY ARTERY BYPASS GRAFTING (CABG);  Surgeon: Loreli Slot, MD;  Location: [facility] OR;  Service: Open Heart Surgery;  Laterality: N/A;  . Colonoscopy    . Femoral-popliteal bypass graft Right 07/14/2012   Procedure: BYPASS GRAFT FEMORAL-POPLITEAL ARTERY;  Surgeon: Sherren Kerns, MD;  Location: [facility] OR;  Service: Vascular;  Laterality: Right;  Right Femoral Popliteal Bypass with Left Leg Nonreversed Greater Saphenous Vein  . Amputation Right 07/14/2012    Procedure: AMPUTATION DIGIT;  Surgeon: Sherren Kerns, MD;  Location: [facility] OR;  Service: Vascular;  Laterality: Right;  Amputation Right Fourth Toe  . Intraoperative arteriogram Right 07/14/2012    Procedure: INTRA OPERATIVE ARTERIOGRAM;  Surgeon: Sherren Kerns, MD;  Location: [facility] OR;  Service: Vascular;  Laterality: Right;  . Left heart catheterization with coronary angiogram N/A 04/24/2011    Procedure: LEFT HEART CATHETERIZATION WITH CORONARY ANGIOGRAM;  Surgeon: Othella Boyer, MD;  Location: [facility] CATH LAB;  Service: Cardiovascular;  Laterality: N/A;  . Abdominal aortagram N/A 07/09/2012    Procedure: ABDOMINAL Ronny Flurry;  Surgeon: Sherren Kerns, MD;  Location: [facility] CATH LAB;  Service: Cardiovascular;  Laterality: N/A;   Family History  Problem Relation Age of Onset  . Heart disease Father   . Cancer Brother   . Diabetes Brother   . Diabetes Daughter   . Anesthesia problems Neg Hx   . Hypotension Neg Hx   . Malignant hyperthermia Neg Hx   . Pseudochol deficiency Neg Hx    Social History  Substance Use Topics  . Smoking status:  Former Smoker -- 1.00 packs/day for 20 years    Types: Cigarettes    Quit date: 02/24/1978  . Smokeless tobacco: Never Used  . Alcohol Use: No    Review of Systems  Constitutional: Positive for fatigue. Negative for fever and chills.  Respiratory: Negative for cough, shortness of breath and wheezing.   Cardiovascular: Negative for chest pain and palpitations.  Gastrointestinal: Negative for nausea, vomiting and abdominal pain.  Genitourinary: Negative for decreased urine volume.  Musculoskeletal: Negative for back pain.  Skin: Negative for rash.  Neurological: Positive for syncope.  All other systems  reviewed and are negative.     Allergies  Review of patient's allergies indicates no known allergies.  Home Medications   Prior to Admission medications   Medication Sig Start Date End Date Taking? Authorizing Provider  acetaminophen (TYLENOL) 500 MG tablet Take 1,000-1,500 mg by mouth daily as needed for mild pain.   Yes Historical Provider, MD  fenofibrate 160 MG tablet Take 160 mg by mouth daily. 06/23/15  Yes Historical Provider, MD  furosemide (LASIX) 40 MG tablet Take 40 mg by mouth daily.   Yes Historical Provider, MD  glipiZIDE (GLUCOTROL) 5 MG tablet Take 5 mg by mouth 2 (two) times daily before a meal.   Yes Historical Provider, MD  metFORMIN (GLUCOPHAGE) 1000 MG tablet TAKE ONE TABLET BY MOUTH TWICE DAILY FOR  DIABETES 10/21/12  Yes Tiffany L Reed, DO  metoprolol tartrate (LOPRESSOR) 25 MG tablet Take 12.5 mg by mouth 2 (two) times daily. 03/06/15  Yes Historical Provider, MD  simvastatin (ZOCOR) 40 MG tablet Take 40 mg by mouth at bedtime. 06/12/15  Yes Historical Provider, MD   BP 115/81 mmHg  Pulse 90  Temp(Src) 98.1 F (36.7 C) (Oral)  Resp 18  Ht 5\' 9"  (1.753 m)  Wt 63.73 kg  BMI 20.74 kg/m2  SpO2 100% Physical Exam  Constitutional: He is oriented to person, place, and time. He appears well-developed and well-nourished. No distress.  eldelry  HENT:  Head: Normocephalic and atraumatic.  Nose: Nose normal.  Eyes: Conjunctivae are normal.  No conj pallor  Neck: Normal range of motion. Neck supple. No tracheal deviation present.  Cardiovascular: Normal rate, regular rhythm and normal heart sounds.   No murmur heard. Pulmonary/Chest: Effort normal and breath sounds normal. No respiratory distress. He has no wheezes. He has no rales.  Abdominal: Soft. Bowel sounds are normal. He exhibits no distension and no mass. There is no tenderness.  Musculoskeletal: Normal range of motion. He exhibits no edema.  No lower extremity edema, calf tenderness, warmth, erythema or  palpable cords   Neurological: He is alert and oriented to person, place, and time.  Skin: Skin is warm and dry. No rash noted.  Psychiatric: He has a normal mood and affect.  Nursing note and vitals reviewed.   ED Course  Procedures (including critical care time) Labs Review Labs Reviewed  BASIC METABOLIC PANEL - Abnormal; Notable for the following:    Sodium 132 (*)    Chloride 100 (*)    CO2 19 (*)    Glucose, Bld 161 (*)    BUN 21 (*)    Creatinine, Ser 1.46 (*)    GFR calc non Af Amer 46 (*)    GFR calc Af Amer 53 (*)    All other components within normal limits  CBC - Abnormal; Notable for the following:    RBC 3.53 (*)    Hemoglobin 10.2 (*)    HCT  31.1 (*)    All other components within normal limits  URINALYSIS, ROUTINE W REFLEX MICROSCOPIC (NOT AT [facility])  MAGNESIUM  COMPREHENSIVE METABOLIC PANEL  CBC  TROPONIN I  TROPONIN I  TROPONIN I  CREATININE, URINE, RANDOM  UREA NITROGEN, URINE  HEMOGLOBIN A1C  LIPID PANEL  I-STAT TROPOININ, ED    Imaging Review Dg Chest 2 View  07/20/2015  CLINICAL DATA:  Dizziness and nausea today. EXAM: CHEST  2 VIEW COMPARISON:  07/13/2012. FINDINGS: Normal sized heart. Post CABG changes. Clear lungs. The lungs remain mildly hyperexpanded. Diffuse osteopenia. IMPRESSION: No acute abnormality.  Stable mild changes of COPD. Electronically Signed   By: Beckie SaltsSteven  Reid [name]   On: 07/20/2015 20:16   I have personally reviewed and evaluated these images and lab results as part of my medical decision-making.   EKG Interpretation   Date/Time:  Friday Jul 20 2015 19:29:40 EDT Ventricular Rate:  72 PR Interval:  162 QRS Duration: 148 QT Interval:  444 QTC Calculation: 486 R Axis:   74 Text Interpretation:  Sinus rhythm Right bundle branch block Probable  inferior infarct, old RBBB new from previous Confirmed by LITTLE MD,  RACHEL [phone]([id number]) on 07/20/2015 7:59:39 PM      MDM   Final diagnoses:  Syncope and collapse    High risk  syncope given history of vascular disease, CAD and carotid stenosis. Unchanged EKG compared to prior without evidence of new dysrhythmia. No ischemic changes. Initial troponin negative.  AKI noted. Gave NS bolus.  Appears well.  Doubt PE givne normal vitals now, no s/s DVT.  EKG not cw WPW or brugada.  Admitted to hospitalist for further syncope observation and workup.    Sofie RowerMike Shanena Pellegrino, MD 07/21/15 [phone]  Laurence Spatesachel Morgan Little, MD 07/24/15 [phone]

## 2015-07-20 NOTE — ED Notes (Signed)
Pt to ED by [facility] c/o dizziness and nausea after leaving [employer]. Pt reports feeling normal all day, was leaving [employer] when pt had to sit down on a bench. Pt denies chest or SOB.

## 2015-07-20 NOTE — ED Notes (Signed)
Admitting MD at bedside.

## 2015-07-20 NOTE — H&P (Addendum)
History and Physical    Daniel Tyler ZOX:[id number] DOB: Sep 12, 1940 DOA: 07/20/2015  Referring MD/NP/PA: Sofie Rower, MD resident PCP: Daniel Davenport., MD  Patient coming from: Transported from [place] via EMS  Chief Complaint: Syncope  HPI: Daniel Tyler is a 75 y.o. male with medical history significant of PVD s/p amputation, CAD s/p CABG, HTN, dyslipidemia, carotid artery disease, and diabetes mellitus type 2; who presents after having a syncopal episode. Patient provides his own history and is s a relatively fair historian. He and his friend Joe who lives across the street went to [employer] this evening. He recalls being in the checkout line some time around 6 PM about to leave to go get something to eat when he recalls feeling ill. He recalls telling his friend Gabriel Rung that he felt like he was going to pass out. Able to be guided to a bench nearby prior to losing consciousness. Denies falling or any head trauma. After sitting down on the bench the next thing he recalls was seeing a bunch of people around him including EMS. Reports being told that he was only out for a minute or less. Patient denies having any chest pain, shortness of breath, palpitations, dysuria, changes in vision, focal weakness. There were no reports of seizure-like activity. Otherwise patient had reported being in his normal state of health and had been eating and drinking like normal. Denies ever having symptoms like this in the past,  having any recent sick contacts, or medication changes. He notes that his power of attorney is Venia Carbon who also helps assist him with organizing his medications to take daily.   ED Course: Upon admission into the emergency department patient was noted to be afebrile with vital signs stable. Workup revealed WBC 8.4, hemoglobin 10.2, sodium 132, potassium 4.4, chloride 100, CO2 19, BUN 21, creatinine 1.46, glucose 161. Initial troponins negative. EKG showing a sinus rhythm with a possible new  right bundle branch block. TRH consulted to admit for observation overnight.  Review of Systems: As per HPI otherwise 10 point review of systems negative.   Past Medical History  Diagnosis Date  . Enlarged heart   . Hyperlipidemia   . Carotid stenosis   . Coronary artery disease   . Pneumonia 2013    hx of  . Anemia   . Diabetes mellitus     takes Glipizide and Metformin daily;Lantus nightly  . Myocardial infarction (HCC) 2013  . Weakness of right leg   . Skin spots-aging   . Depression     bc of foot pain but no meds required    Past Surgical History  Procedure Laterality Date  . Cardiac catheterization  2013  . Endarterectomy  07/01/2011    Procedure: ENDARTERECTOMY CAROTID;  Surgeon: Sherren Kerns, MD;  Location: [facility] OR;  Service: Vascular;  Laterality: Right;  . Carotid endarterectomy  07/01/11    Right CEA  . Abdominal aortagram    . Coronary artery bypass graft  07/01/2011    Procedure: CORONARY ARTERY BYPASS GRAFTING (CABG);  Surgeon: Loreli Slot, MD;  Location: [facility] OR;  Service: Open Heart Surgery;  Laterality: N/A;  . Colonoscopy    . Femoral-popliteal bypass graft Right 07/14/2012    Procedure: BYPASS GRAFT FEMORAL-POPLITEAL ARTERY;  Surgeon: Sherren Kerns, MD;  Location[facility] OR;  Service: Vascular;  Laterality: Right;  Right Femoral Popliteal Bypass with Left Leg Nonreversed Greater Saphenous Vein  . Amputation Right 07/14/2012    Procedure: AMPUTATION DIGIT;  Surgeon: Sherren Kernsharles E Fields, MD;  [facility];  Service: Vascular;  Laterality: Right;  Amputation Right Fourth Toe  . Intraoperative arteriogram Right 07/14/2012    Procedure: INTRA OPERATIVE ARTERIOGRAM;  Surgeon: Sherren Kernsharles E Fields, MD;  [facility];  Service: Vascular;  Laterality: Right;  . Left heart catheterization with coronary angiogram N/A 04/24/2011    Procedure: LEFT HEART CATHETERIZATION WITH CORONARY ANGIOGRAM;  Surgeon: Othella BoyerWilliam S Tilley, MD;  [facility];  Service:  Cardiovascular;  Laterality: N/A;  . Abdominal aortagram N/A 07/09/2012    Procedure: ABDOMINAL Ronny FlurryAORTAGRAM;  Surgeon: Sherren Kernsharles E Fields, MD;  [facility];  Service: Cardiovascular;  Laterality: N/A;     reports that he quit smoking about 37 years ago. His smoking use included Cigarettes. He has a 20 pack-year smoking history. He has never used smokeless tobacco. He reports that he does not drink alcohol or use illicit drugs.  No Known Allergies  Family History  Problem Relation Age of Onset  . Heart disease Father   . Cancer Brother   . Diabetes Brother   . Diabetes Daughter   . Anesthesia problems Neg Hx   . Hypotension Neg Hx   . Malignant hyperthermia Neg Hx   . Pseudochol deficiency Neg Hx     Prior to Admission medications   Medication Sig Start Date End Date Taking? Authorizing Provider  acetaminophen (TYLENOL) 500 MG tablet Take 1,000-1,500 mg by mouth daily as needed for mild pain.   Yes Historical Provider, MD  fenofibrate 160 MG tablet Take 160 mg by mouth daily. 06/23/15  Yes Historical Provider, MD  furosemide (LASIX) 40 MG tablet Take 40 mg by mouth daily.   Yes Historical Provider, MD  glipiZIDE (GLUCOTROL) 5 MG tablet Take 5 mg by mouth 2 (two) times daily before a meal.   Yes Historical Provider, MD  metFORMIN (GLUCOPHAGE) 1000 MG tablet TAKE ONE TABLET BY MOUTH TWICE DAILY FOR  DIABETES 10/21/12  Yes Tiffany L Reed, DO  metoprolol tartrate (LOPRESSOR) 25 MG tablet Take 12.5 mg by mouth 2 (two) times daily. 03/06/15  Yes Historical Provider, MD  simvastatin (ZOCOR) 40 MG tablet Take 40 mg by mouth at bedtime. 06/12/15  Yes Historical Provider, MD    Physical Exam: Filed Vitals:   07/20/15 2100 07/20/15 2115 07/20/15 2130 07/20/15 2145  BP: 134/64 149/64 115/67 136/63  Pulse: 72 73 72 69  Temp:      TempSrc:      Resp: 18 18 18 21   SpO2: 97% 99% 100% 96%      Constitutional: NAD, calm, comfortable Filed Vitals:   07/20/15 2100 07/20/15 2115 07/20/15  2130 07/20/15 2145  BP: 134/64 149/64 115/67 136/63  Pulse: 72 73 72 69  Temp:      TempSrc:      Resp: 18 18 18 21   SpO2: 97% 99% 100% 96%   Eyes: PERRL, lids and conjunctivae normal ENMT: Mucous membranes are moist. Posterior pharynx clear of any exudate or lesions.Normal dentition.  Neck: normal, supple, no masses, no thyromegaly Respiratory: clear to auscultation bilaterally, no wheezing, no crackles. Normal respiratory effort. No accessory muscle use.  Cardiovascular: Regular rate and rhythm, no murmurs / rubs / gallops. No extremity edema. 2+ pedal pulses. No carotid bruits. Healed median sternotomy scar Abdomen: no tenderness, no masses palpated. No hepatosplenomegaly. Bowel sounds positive.  Musculoskeletal: no clubbing / cyanosis. Amputation of the right fourth toe. Good ROM, no contractures. Normal muscle tone.  Skin: no rashes,  lesions, ulcers. No induration. Green discoloration around the nape of neck. Neurologic: CN 2-12 grossly intact. Sensation intact, DTR normal. Strength 5/5 in all 4.  Psychiatric: Normal judgment and insight. Alert and oriented x 3. Normal mood.     Labs on Admission: I have personally reviewed following labs and imaging studies  CBC:  Recent Labs Lab 07/20/15 1934  WBC 8.4  HGB 10.2*  HCT 31.1*  MCV 88.1  PLT 348   Basic Metabolic Panel:  Recent Labs Lab 07/20/15 1934  NA 132*  K 4.4  CL 100*  CO2 19*  GLUCOSE 161*  BUN 21*  CREATININE 1.46*  CALCIUM 9.0   GFR: CrCl cannot be calculated (Unknown ideal weight.). Liver Function Tests: No results for input(s): AST, ALT, ALKPHOS, BILITOT, PROT, ALBUMIN in the last 168 hours. No results for input(s): LIPASE, AMYLASE in the last 168 hours. No results for input(s): AMMONIA in the last 168 hours. Coagulation Profile: No results for input(s): INR, PROTIME in the last 168 hours. Cardiac Enzymes: No results for input(s): CKTOTAL, CKMB, CKMBINDEX, TROPONINI in the last 168 hours. BNP  (last 3 results) No results for input(s): PROBNP in the last 8760 hours. HbA1C: No results for input(s): HGBA1C in the last 72 hours. CBG: No results for input(s): GLUCAP in the last 168 hours. Lipid Profile: No results for input(s): CHOL, HDL, LDLCALC, TRIG, CHOLHDL, LDLDIRECT in the last 72 hours. Thyroid Function Tests: No results for input(s): TSH, T4TOTAL, FREET4, T3FREE, THYROIDAB in the last 72 hours. Anemia Panel: No results for input(s): VITAMINB12, FOLATE, FERRITIN, TIBC, IRON, RETICCTPCT in the last 72 hours. Urine analysis:    Component Value Date/Time   COLORURINE YELLOW 07/13/2012 1445   APPEARANCEUR CLEAR 07/13/2012 1445   LABSPEC 1.035* 07/13/2012 1445   PHURINE 5.5 07/13/2012 1445   GLUCOSEU >1000* 07/13/2012 1445   HGBUR NEGATIVE 07/13/2012 1445   BILIRUBINUR NEGATIVE 07/13/2012 1445   KETONESUR NEGATIVE 07/13/2012 1445   PROTEINUR NEGATIVE 07/13/2012 1445   UROBILINOGEN 1.0 07/13/2012 1445   NITRITE NEGATIVE 07/13/2012 1445   LEUKOCYTESUR NEGATIVE 07/13/2012 1445   Sepsis Labs: No results found for this or any previous visit (from the past 240 hour(s)).   Radiological Exams on Admission: Dg Chest 2 View  07/20/2015  CLINICAL DATA:  Dizziness and nausea today. EXAM: CHEST  2 VIEW COMPARISON:  07/13/2012. FINDINGS: Normal sized heart. Post CABG changes. Clear lungs. The lungs remain mildly hyperexpanded. Diffuse osteopenia. IMPRESSION: No acute abnormality.  Stable mild changes of COPD. Electronically Signed   By: Beckie Salts [name]   On: 07/20/2015 20:16    EKG: Independently reviewed. Sinus rhythm with possible  new right bundle branch block  Assessment/Plan Syncope: Acute. Patient reports stating that he felt like he is going to pass out with associated symptoms of feeling hot and flushed prior to his syncopal event while sitting in chair. Suspect vasovagal given history could be related to dehydration versus arrhythmia versus infection versus hypoglycemia.  Patient given 1 L of IV fluids in the ED. - Admit to telemetry bed - Check orthostatic vital signs x 2 - Check echocardiogram and carotid vascular duplex in a.m.  - Up with assistance/ fall precautions - IV fluids normal saline 62ml/hr overnight as tolerated - Social work patient lives alone  - Follow-up telemetry in a.m. - Determine if cardiology consult is warranted in a.m. for possible new right bundle branch block   Suspected acute kidney injury : Acute. Patient's baseline creatinine was previously noted to be  around 0.71Review records from care if he were - Check urinalysis and FeNa - Follow-up repeat BMP  Essential hypertension - Held Lasix,  restart when appropriate  - Continue metoprolol  Diabetes mellitus type 2: Stable. Patient blood glucose noted to be 160 on admission - check Hbg a1c in am - Held metformin and glipizide   - Hypoglycemic protocol     Systolic congestive heart failure: Last EF noted to be 30-35% in 03/2011 - Monitor I&Os  Anemia: Hemoglobin 10.2 on admission - Recheck CBC in a.m.  Dyslipidemia - Check lipid panel in a.m. - Continue simvastatin and Fenofibrate   Discoloration of skin around neck: Acute. Patient notes no previous history or knowledge of this - May consider further investigation    DVT prophylaxis:   Lovenox Code Status: Full Family Communication:  None Disposition Plan:  possible discharge in 1-2 days  Consults called: None Admission status: Possible discharge home  Clydie Braun MD [facility] Pager [phone]  If 7PM-7AM, please contact night-coverage [email or web address] Password [facility]  07/20/2015, 10:31 PM

## 2015-07-21 ENCOUNTER — Observation Stay (HOSPITAL_BASED_OUTPATIENT_CLINIC_OR_DEPARTMENT_OTHER): Payer: Medicare Other

## 2015-07-21 ENCOUNTER — Encounter (HOSPITAL_COMMUNITY): Payer: Self-pay

## 2015-07-21 DIAGNOSIS — R55 Syncope and collapse: Secondary | ICD-10-CM

## 2015-07-21 LAB — COMPREHENSIVE METABOLIC PANEL
ALBUMIN: 3.5 g/dL (ref 3.5–5.0)
ALT: 11 U/L — ABNORMAL LOW (ref 17–63)
ANION GAP: 9 (ref 5–15)
AST: 17 U/L (ref 15–41)
Alkaline Phosphatase: 20 U/L — ABNORMAL LOW (ref 38–126)
BUN: 18 mg/dL (ref 6–20)
CO2: 25 mmol/L (ref 22–32)
Calcium: 8.8 mg/dL — ABNORMAL LOW (ref 8.9–10.3)
Chloride: 102 mmol/L (ref 101–111)
Creatinine, Ser: 1.17 mg/dL (ref 0.61–1.24)
GFR calc non Af Amer: 60 mL/min — ABNORMAL LOW (ref >60)
GLUCOSE: 151 mg/dL — AB (ref 65–99)
POTASSIUM: 3.6 mmol/L (ref 3.5–5.1)
SODIUM: 136 mmol/L (ref 135–145)
Total Bilirubin: 0.3 mg/dL (ref 0.3–1.2)
Total Protein: 6.4 g/dL — ABNORMAL LOW (ref 6.5–8.1)

## 2015-07-21 LAB — LIPID PANEL
CHOL/HDL RATIO: 4.2 ratio
CHOLESTEROL: 135 mg/dL (ref 0–200)
HDL: 32 mg/dL — AB (ref >40)
LDL Cholesterol: 66 mg/dL (ref 0–99)
Triglycerides: 184 mg/dL — ABNORMAL HIGH (ref <150)
VLDL: 37 mg/dL (ref 0–40)

## 2015-07-21 LAB — ECHOCARDIOGRAM COMPLETE
HEIGHTINCHES: 69 in
WEIGHTICAEL: 2248 [oz_av]

## 2015-07-21 LAB — CBC
HEMATOCRIT: 30.4 % — AB (ref 39.0–52.0)
HEMOGLOBIN: 9.9 g/dL — AB (ref 13.0–17.0)
MCH: 29.1 pg (ref 26.0–34.0)
MCHC: 32.6 g/dL (ref 30.0–36.0)
MCV: 89.4 fL (ref 78.0–100.0)
Platelets: 340 10*3/uL (ref 150–400)
RBC: 3.4 MIL/uL — AB (ref 4.22–5.81)
RDW: 14.4 % (ref 11.5–15.5)
WBC: 7.1 10*3/uL (ref 4.0–10.5)

## 2015-07-21 LAB — TROPONIN I

## 2015-07-21 LAB — MAGNESIUM: Magnesium: 1.4 mg/dL — ABNORMAL LOW (ref 1.7–2.4)

## 2015-07-21 MED ORDER — MAGNESIUM SULFATE 2 GM/50ML IV SOLN
2.0000 g | Freq: Once | INTRAVENOUS | Status: AC
  Administered 2015-07-21: 2 g via INTRAVENOUS
  Filled 2015-07-21: qty 50

## 2015-07-21 MED ORDER — MAGNESIUM OXIDE 400 (241.3 MG) MG PO TABS
400.0000 mg | ORAL_TABLET | Freq: Two times a day (BID) | ORAL | Status: DC
Start: 2015-07-21 — End: 2015-07-21
  Administered 2015-07-21: 400 mg via ORAL
  Filled 2015-07-21: qty 1

## 2015-07-21 MED ORDER — MAGNESIUM OXIDE 400 (241.3 MG) MG PO TABS: 400.0000 mg | ORAL_TABLET | Freq: Two times a day (BID) | ORAL | Status: DC

## 2015-07-21 NOTE — Progress Notes (Signed)
Order received to discharge patient.  CCMD notified.  Discharge instructions were reviewed with patient and patient voiced understanding.  Patient denies pain currently.  IV removed with catheter intact.  Patient denies any dizziness currently.  Patient is stable for discharge at the present time.

## 2015-07-21 NOTE — Discharge Instructions (Signed)

## 2015-07-21 NOTE — Progress Notes (Signed)
Echocardiogram 2D Echocardiogram has been performed.  Dorothey BasemanReel, Ady Heimann M 07/21/2015, 9:25 AM

## 2015-07-21 NOTE — Discharge Summary (Signed)
Physician Discharge Summary  Daniel Tyler WUJ:[id number] DOB: [birth year] DOA: 07/20/2015  PCP: Dois Davenport., MD  Admit date: 07/20/2015 Discharge date: 07/21/2015  Recommendations for Outpatient Follow-up:  1. Pt will need to follow up with PCP in 1-2 weeks post discharge 2. Please obtain BMP to evaluate electrolytes and kidney function 3. Please also check CBC to evaluate Hg and Hct levels 4. Check Mg level  5. Please also check orthostatic vitals  6. Please check CBG level as well as pt on antihyperglycemic regimen and was worried about hypoglycemic event leading to near syncope, pt has had stable CBG while inpatient 7. We have checked A1C but since pt wanted to go home so this test is pending upon discharge   Discharge Diagnoses:  Principal Problem:   Syncope Active Problems:   Dyslipidemia  Discharge Condition: Stable  Diet recommendation: Heart healthy diet discussed in details   History of present illness:  75 y.o. male with known PVD, CAD s/p CABG, HTN, dyslipidemia, carotid artery disease, and diabetes mellitus type 2; who presented after having a syncopal episode. He and his friend Joe who lives across the street went to [place]. He recalls being in the checkout line some time around 6 PM about to leave to go get something to eat when he recalls feeling ill. He recalls telling his friend Gabriel Rung that he felt like he was going to pass out. Able to be guided to a bench nearby prior to losing consciousness.   Hospital Course:  Principal Problem:   Syncope - suspect this was vasovagal, dehydration, ? Hypoglycemic event - ECHO stable with normal EF and no diastolic CHF - pt ambulating in hallway, HH PT recommended and order placed - pt wants to go home and that is reasonable but he must follow up with PCP, he was made aware   Active Problems:   Pre renal azotemia, hyponatremia, metabolic acidosis  - with dehydration - IVF and Cr is WNL     Carotid artery disease (HCC) -  no chest pain - ECHO stable     DM type II - A1C pending upon discharge - continue home regimen with close follow up  Procedures/Studies: Dg Chest 2 View  07/20/2015 No acute abnormality.  Stable mild changes of COPD.  Discharge Exam: Filed Vitals:   07/21/15 0400 07/21/15 1258  BP: 103/82   Pulse: 71   Temp: 98.3 F (36.8 C) 98.1 F (36.7 C)  Resp: 18 19   Filed Vitals:   07/20/15 2145 07/20/15 2347 07/21/15 0400 07/21/15 1258  BP: 136/63 115/81 103/82   Pulse: 69 90 71   Temp:  98.1 F (36.7 C) 98.3 F (36.8 C) 98.1 F (36.7 C)  TempSrc:  Oral Oral Oral  Resp: Height:   (1.753 m)    Weight:  63.73 kg (140 lb 8 oz)    SpO2: 96% 100% 92% 97%    General: Pt is alert, follows commands appropriately, not in acute distress Cardiovascular: Regular rate and rhythm, no rubs, no gallops Respiratory: Clear to auscultation bilaterally, no wheezing, no crackles, no rhonchi Abdominal: Soft, non tender, non distended, bowel sounds +, no guarding  Discharge Instructions  Discharge Instructions    Diet - low sodium heart healthy    Complete by:  As directed      Increase activity slowly    Complete by:  As directed             Medication List  TAKE these medications        acetaminophen 500 MG tablet  Commonly known as:  TYLENOL  Take 1,000-1,500 mg by mouth daily as needed for mild pain.     fenofibrate 160 MG tablet  Take 160 mg by mouth daily.     furosemide 40 MG tablet  Commonly known as:  LASIX  Take 40 mg by mouth daily.     glipiZIDE 5 MG tablet  Commonly known as:  GLUCOTROL  Take 5 mg by mouth 2 (two) times daily before a meal.     magnesium oxide 400 (241.3 Mg) MG tablet  Commonly known as:  MAG-OX  Take 1 tablet (400 mg total) by mouth 2 (two) times daily.     metFORMIN 1000 MG tablet  Commonly known as:  GLUCOPHAGE  TAKE ONE TABLET BY MOUTH TWICE DAILY FOR  DIABETES     metoprolol tartrate 25 MG tablet  Commonly known as:   LOPRESSOR  Take 12.5 mg by mouth 2 (two) times daily.     simvastatin 40 MG tablet  Commonly known as:  ZOCOR  Take 40 mg by mouth at bedtime.            Follow-up Information    Follow up with Dois DavenportICHTER,KAREN L., MD.   Specialty:  Family Medicine   Contact information:   [address] [place] [address] [phone]       Call Debbora PrestoMAGICK-Sinaya Minogue, MD.   Specialty:  Internal Medicine   Why:  As needed call my cee phone [phone]   Contact information:   [address] [place] [address] [phone]        The results of significant diagnostics from this hospitalization (including imaging, microbiology, ancillary and laboratory) are listed below for reference.     Microbiology: No results found for this or any previous visit (from the past 240 hour(s)).   Labs: Basic Metabolic Panel:  Recent Labs Lab 07/20/15 1934 07/21/15 0015 07/21/15 0530  NA 132*  --  136  K 4.4  --  3.6  CL 100*  --  102  CO2 19*  --  25  GLUCOSE 161*  --  151*  BUN 21*  --  18  CREATININE 1.46*  --  1.17  CALCIUM 9.0  --  8.8*  MG  --  1.4*  --    Liver Function Tests:  Recent Labs Lab 07/21/15 0530  AST 17  ALT 11*  ALKPHOS 20*  BILITOT 0.3  PROT 6.4*  ALBUMIN 3.5   No results for input(s): LIPASE, AMYLASE in the last 168 hours. No results for input(s): AMMONIA in the last 168 hours. CBC:  Recent Labs Lab 07/20/15 1934 07/21/15 0530  WBC 8.4 7.1  HGB 10.2* 9.9*  HCT 31.1* 30.4*  MCV 88.1 89.4  PLT 348 340   Cardiac Enzymes:  Recent Labs Lab 07/21/15 0015 07/21/15 0530  TROPONINI <0.03 <0.03   BNP: BNP (last 3 results) No results for input(s): BNP in the last 8760 hours.  ProBNP (last 3 results) No results for input(s): PROBNP in the last 8760 hours.  CBG: No results for input(s): GLUCAP in the last 168 hours.   SIGNED: Time coordinating discharge: Over 30 minutes  Debbora PrestoMAGICK-Ladonne Sharples, MD  [facility] 07/21/2015, 2:39 PM Pager [phone]  If 7PM-7AM, please contact night-coverage [email or web address] Password [email or web address]

## 2015-07-21 NOTE — Care Management Note (Addendum)
Case Management Note  Patient Details  Name: Daniel Tyler MRN: [id number] Date of Birth: 18-Jan-1941  Subjective/Objective:        syncope            Action/Plan: Discharge Planning: AVS reviewed:   NCM spoke to pt and [facility] States he used an agency in the past and have number at home. NCM will follow up with pt on 07/22/2014 to get [facility] name [facility]. Pt reports he has RW at home.    PCP- Dois DavenportICHTER, KAREN L  MD  07/22/2015 1351 NCM contacted pt and states he had [facility]. Contacted [facility] Liaison with new referral.    Expected Discharge Date:  07/21/2015               Expected Discharge Plan:  Home w Home Health Services  In-House Referral:  NA  Discharge planning Services  CM Consult  Post Acute Care Choice:  Home Health Choice offered to:  Patient  DME Arranged:  N/A DME Agency:  NA  HH Arranged:  PT, OT [facility]  Status of Service: complete   Medicare Important Message Given:    Date Medicare IM Given:    Medicare IM give by:    Date Additional Medicare IM Given:    Additional Medicare Important Message give by:     If discussed at Long Length of Stay Meetings, dates discussed:    Additional Comments:  Elliot CousinShavis, [name] Ellen, RN 07/21/2015, 4:55 PM

## 2015-07-21 NOTE — Progress Notes (Signed)
*  PRELIMINARY RESULTS* Vascular Ultrasound Carotid Duplex (Doppler) has been completed.  Preliminary findings: Hx right carotid endarterectomy. Right ICA no significant stenosis. Left ICA 40-59% stenosis.  Farrel DemarkJill Eunice, RDMS, RVT  07/21/2015, 11:45 AM

## 2015-07-21 NOTE — Progress Notes (Signed)
Order for Home Healthcare was received while in the process of discharging patient.  Message was left with case manager regarding setting this up for patient and/or information that patient may need.  Nurse will follow up tomorrow.

## 2015-07-21 NOTE — Evaluation (Addendum)
Physical Therapy Evaluation and D/C Patient Details Name: Daniel Tyler MRN: [id number] DOB: Jun 23, 1940 Today's Date: 07/21/2015   History of Present Illness  Daniel Tyler is a 75 y.o. male with medical history significant of PVD s/p amputation, CAD s/p CABG, HTN, dyslipidemia, carotid artery disease, and diabetes mellitus type 2; who presents after having a syncopal episode. Patient provides his own history and is s a relatively fair historian. He and his friend Joe who lives across the street went to [employer] this evening. He recalls being in the checkout line some time around 6 PM about to leave to go get something to eat when he recalls feeling ill. He recalls telling his friend Wille Glaser that he felt like he was going to pass out. Able to be guided to a bench nearby prior to losing consciousness. Denies falling or any head trauma. After sitting down on the bench the next thing he recalls was seeing a bunch of people around him including EMS. Reports being told that he was only out for a minute or less. Patient denies having any chest pain, shortness of breath, palpitations, dysuria, changes in vision, focal weakness. There were no reports of seizure-like activity. Otherwise patient had reported being in his normal state of health and had been eating and drinking like normal. Denies ever having symptoms like this in the past, having any recent sick contacts, or medication changes. He notes that his power of attorney is Melynda Keller who also helps assist him with organizing his medications to take daily.   Clinical Impression  Pt admitted with above diagnosis. Pt currently with functional limitations due to the deficits listed below (see PT Problem List). Pt was able to ambulate with RW with good safety and pt has a RW at home he can use.  Needed steadying assist when not using RW.  Pt understands that PT recommends he use RW at all times at home.  HHPT safety eval recommended as well. Feel that with above in  place, pt will be able to manage at home with pt really wanting to go home today and MD to release pt. Pt will benefit from skilled PT to increase their independence and safety with mobility to allow discharge to the venue listed below.      Follow Up Recommendations Home health PT;Supervision - Intermittent    Equipment Recommendations  None recommended by PT    Recommendations for Other Services       Precautions / Restrictions Precautions Precautions: Fall Restrictions Weight Bearing Restrictions: No      Mobility  Bed Mobility Overal bed mobility: Independent                Transfers Overall transfer level: Independent                  Ambulation/Gait Ambulation/Gait assistance: Supervision;Min guard Ambulation Distance (Feet): 450 Feet Assistive device: Rolling walker (2 wheeled);None Gait Pattern/deviations: Step-through pattern;Decreased stride length;Staggering left;Staggering right   Gait velocity interpretation: Below normal speed for age/gender General Gait Details: Pt needs steadying assist at times when ambulating without RW with challenges to balance.  Ambulates well with RW with challenges to balance.  Pt instructed to use the RW at all times when he goes home.   Stairs            Wheelchair Mobility    Modified Rankin (Stroke Patients Only)       Balance Overall balance assessment: Needs assistance Sitting-balance support: No upper extremity supported;Feet  supported Sitting balance-Leahy Scale: Good     Standing balance support: No upper extremity supported;During functional activity Standing balance-Leahy Scale: Fair Standing balance comment: Pt can stand statically and balance without the RW.  WAs able to wipe self and pull up underwear without UE support.               High level balance activites: Direction changes;Turns;Sudden stops;Head turns High Level Balance Comments: Needed min guard assist with challenges  without device for steadying support.  Standardized Balance Assessment Standardized Balance Assessment : Dynamic Gait Index   Dynamic Gait Index Level Surface: Normal Change in Gait Speed: Mild Impairment Gait with Horizontal Head Turns: Mild Impairment Gait with Vertical Head Turns: Mild Impairment Gait and Pivot Turn: Mild Impairment Step Over Obstacle: Mild Impairment Step Around Obstacles: Mild Impairment Steps: Mild Impairment Total Score: 17       Pertinent Vitals/Pain Pain Assessment: No/denies pain  O2 sats >90% on RA with ambulation. Other VSS    Home Living Family/patient expects to be discharged to:: Private residence Living Arrangements: Alone Available Help at Discharge: Friend(s);Available PRN/intermittently Type of Home: Mobile home Home Access: Stairs to enter Entrance Stairs-Rails: Left Entrance Stairs-Number of Steps: 4 Home Layout: One level Home Equipment: Walker - 2 wheels      Prior Function Level of Independence: Independent with assistive device(s)         Comments: used RW when he wasn't feeling well per pt     Hand Dominance        Extremity/Trunk Assessment   Upper Extremity Assessment: Defer to OT evaluation           Lower Extremity Assessment: Generalized weakness      Cervical / Trunk Assessment: Normal  Communication   Communication: No difficulties  Cognition Arousal/Alertness: Awake/alert Behavior During Therapy: WFL for tasks assessed/performed Overall Cognitive Status: Within Functional Limits for tasks assessed                      General Comments General comments (skin integrity, edema, etc.): Pt scored 17/24 on DGI suggesting pt needs RW for stability.  Pt agrees to use at home for safety.      Exercises        Assessment/Plan    PT Assessment All further PT needs can be met in the next venue of care  PT Diagnosis Generalized weakness   PT Problem List Decreased activity tolerance;Decreased  balance;Decreased mobility;Decreased knowledge of use of DME;Decreased safety awareness;Decreased knowledge of precautions  PT Treatment Interventions DME instruction;Functional mobility training;Therapeutic activities;Therapeutic exercise;Patient/family education;Balance training;Stair training;Gait training   PT Goals (Current goals can be found in the Care Plan section) Acute Rehab PT Goals Patient Stated Goal: to go home today PT Goal Formulation: All assessment and education complete, DC therapy    Frequency     Barriers to discharge        Co-evaluation               End of Session Equipment Utilized During Treatment: Gait belt Activity Tolerance: Patient limited by fatigue Patient left: in bed;with call bell/phone within reach;with bed alarm set Nurse Communication: Mobility status    Functional Assessment Tool Used: clinical judgment and DGI Functional Limitation: Mobility: Walking and moving around Mobility: Walking and Moving Around Current Status (G1829): At least 1 percent but less than 20 percent impaired, limited or restricted Mobility: Walking and Moving Around Goal Status [phone]): At least 1 percent but less than 20  percent impaired, limited or restricted Mobility: Walking and Moving Around Discharge Status [phone]): At least 1 percent but less than 20 percent impaired, limited or restricted    Time: 1512-1530 PT Time Calculation (min) (ACUTE ONLY): 18 min   Charges:   PT Evaluation $PT Eval Moderate Complexity: 1 Procedure     PT G Codes:   PT G-Codes **NOT FOR INPATIENT CLASS** Functional Assessment Tool Used: clinical judgment and DGI Functional Limitation: Mobility: Walking and moving around Mobility: Walking and Moving Around Current Status (W4136): At least 1 percent but less than 20 percent impaired, limited or restricted Mobility: Walking and Moving Around Goal Status [phone]): At least 1 percent but less than 20 percent impaired, limited or  restricted Mobility: Walking and Moving Around Discharge Status [phone]): At least 1 percent but less than 20 percent impaired, limited or restricted    Denice Paradise 07/21/2015, 3:38 PM [facility] [phone] (pager)

## 2015-07-22 NOTE — Progress Notes (Signed)
07/22/2015 -  [facility] sent [facility] information per instruction.  [facility].  Will notify CM of referral.

## 2015-07-24 LAB — HEMOGLOBIN A1C
Hgb A1c MFr Bld: 8 % — ABNORMAL HIGH (ref 4.8–5.6)
MEAN PLASMA GLUCOSE: 183 mg/dL

## 2015-07-24 NOTE — Care Management (Signed)
[facility] and pt refused [facility] services. [facility] to follow up with his PCP. Isidoro DonningAlesia Deshayla Empson RN CCM Case Mgmt phone [phone]

## 2015-09-15 DIAGNOSIS — L97519 Non-pressure chronic ulcer of other part of right foot with unspecified severity: Secondary | ICD-10-CM | POA: Insufficient documentation

## 2015-09-15 DIAGNOSIS — D649 Anemia, unspecified: Secondary | ICD-10-CM | POA: Insufficient documentation

## 2015-09-15 DIAGNOSIS — N289 Disorder of kidney and ureter, unspecified: Secondary | ICD-10-CM | POA: Insufficient documentation

## 2015-11-30 DIAGNOSIS — L989 Disorder of the skin and subcutaneous tissue, unspecified: Secondary | ICD-10-CM | POA: Insufficient documentation

## 2016-06-10 DIAGNOSIS — E1165 Type 2 diabetes mellitus with hyperglycemia: Secondary | ICD-10-CM

## 2016-06-10 DIAGNOSIS — IMO0002 Reserved for concepts with insufficient information to code with codable children: Secondary | ICD-10-CM | POA: Insufficient documentation

## 2016-06-10 DIAGNOSIS — E1142 Type 2 diabetes mellitus with diabetic polyneuropathy: Secondary | ICD-10-CM | POA: Insufficient documentation

## 2017-09-07 ENCOUNTER — Other Ambulatory Visit: Payer: Self-pay

## 2017-09-07 DIAGNOSIS — L97511 Non-pressure chronic ulcer of other part of right foot limited to breakdown of skin: Secondary | ICD-10-CM

## 2017-09-29 ENCOUNTER — Ambulatory Visit (INDEPENDENT_AMBULATORY_CARE_PROVIDER_SITE_OTHER)
Admission: RE | Admit: 2017-09-29 | Discharge: 2017-09-29 | Disposition: A | Discharge status: Home / Self Care | Payer: Medicare Other | Source: Ambulatory Visit | Attending: Vascular Surgery | Admitting: Vascular Surgery

## 2017-09-29 ENCOUNTER — Ambulatory Visit (HOSPITAL_COMMUNITY)
Admission: RE | Admit: 2017-09-29 | Discharge: 2017-09-29 | Disposition: A | Discharge status: Home / Self Care | Payer: Medicare Other | Source: Ambulatory Visit | Attending: Vascular Surgery | Admitting: Vascular Surgery

## 2017-09-29 DIAGNOSIS — Z87891 Personal history of nicotine dependence: Secondary | ICD-10-CM | POA: Diagnosis not present

## 2017-09-29 DIAGNOSIS — L97511 Non-pressure chronic ulcer of other part of right foot limited to breakdown of skin: Secondary | ICD-10-CM | POA: Diagnosis not present

## 2017-09-29 DIAGNOSIS — I6523 Occlusion and stenosis of bilateral carotid arteries: Secondary | ICD-10-CM | POA: Diagnosis not present

## 2017-10-08 ENCOUNTER — Ambulatory Visit: Payer: Medicare Other | Admitting: Vascular Surgery

## 2017-10-16 ENCOUNTER — Other Ambulatory Visit: Payer: Self-pay

## 2017-10-16 ENCOUNTER — Emergency Department (HOSPITAL_COMMUNITY): Payer: Medicare Other

## 2017-10-16 ENCOUNTER — Encounter (HOSPITAL_COMMUNITY): Payer: Self-pay

## 2017-10-16 ENCOUNTER — Inpatient Hospital Stay (HOSPITAL_COMMUNITY)
Admission: EM | Admit: 2017-10-16 | Discharge: 2017-10-25 | DRG: 616 | Disposition: A | Discharge status: Skilled Nursing Facility | Payer: Medicare Other | Attending: Internal Medicine | Admitting: Internal Medicine

## 2017-10-16 DIAGNOSIS — Z8249 Family history of ischemic heart disease and other diseases of the circulatory system: Secondary | ICD-10-CM

## 2017-10-16 DIAGNOSIS — D62 Acute posthemorrhagic anemia: Secondary | ICD-10-CM | POA: Diagnosis present

## 2017-10-16 DIAGNOSIS — I70261 Atherosclerosis of native arteries of extremities with gangrene, right leg: Secondary | ICD-10-CM | POA: Diagnosis present

## 2017-10-16 DIAGNOSIS — L03031 Cellulitis of right toe: Secondary | ICD-10-CM | POA: Diagnosis present

## 2017-10-16 DIAGNOSIS — Z794 Long term (current) use of insulin: Secondary | ICD-10-CM

## 2017-10-16 DIAGNOSIS — E119 Type 2 diabetes mellitus without complications: Secondary | ICD-10-CM

## 2017-10-16 DIAGNOSIS — I96 Gangrene, not elsewhere classified: Secondary | ICD-10-CM | POA: Diagnosis not present

## 2017-10-16 DIAGNOSIS — I2510 Atherosclerotic heart disease of native coronary artery without angina pectoris: Secondary | ICD-10-CM | POA: Diagnosis present

## 2017-10-16 DIAGNOSIS — E440 Moderate protein-calorie malnutrition: Secondary | ICD-10-CM | POA: Diagnosis present

## 2017-10-16 DIAGNOSIS — Z8701 Personal history of pneumonia (recurrent): Secondary | ICD-10-CM | POA: Diagnosis not present

## 2017-10-16 DIAGNOSIS — E1152 Type 2 diabetes mellitus with diabetic peripheral angiopathy with gangrene: Secondary | ICD-10-CM | POA: Diagnosis present

## 2017-10-16 DIAGNOSIS — E785 Hyperlipidemia, unspecified: Secondary | ICD-10-CM | POA: Diagnosis present

## 2017-10-16 DIAGNOSIS — L97919 Non-pressure chronic ulcer of unspecified part of right lower leg with unspecified severity: Secondary | ICD-10-CM | POA: Diagnosis present

## 2017-10-16 DIAGNOSIS — Z7984 Long term (current) use of oral hypoglycemic drugs: Secondary | ICD-10-CM

## 2017-10-16 DIAGNOSIS — Z89421 Acquired absence of other right toe(s): Secondary | ICD-10-CM

## 2017-10-16 DIAGNOSIS — Z833 Family history of diabetes mellitus: Secondary | ICD-10-CM | POA: Diagnosis not present

## 2017-10-16 DIAGNOSIS — Z87891 Personal history of nicotine dependence: Secondary | ICD-10-CM

## 2017-10-16 DIAGNOSIS — E1169 Type 2 diabetes mellitus with other specified complication: Secondary | ICD-10-CM | POA: Diagnosis present

## 2017-10-16 DIAGNOSIS — Z951 Presence of aortocoronary bypass graft: Secondary | ICD-10-CM | POA: Diagnosis not present

## 2017-10-16 DIAGNOSIS — A480 Gas gangrene: Secondary | ICD-10-CM | POA: Diagnosis present

## 2017-10-16 DIAGNOSIS — Z79899 Other long term (current) drug therapy: Secondary | ICD-10-CM

## 2017-10-16 DIAGNOSIS — M869 Osteomyelitis, unspecified: Secondary | ICD-10-CM | POA: Diagnosis present

## 2017-10-16 DIAGNOSIS — I10 Essential (primary) hypertension: Secondary | ICD-10-CM | POA: Diagnosis present

## 2017-10-16 DIAGNOSIS — I998 Other disorder of circulatory system: Secondary | ICD-10-CM | POA: Diagnosis not present

## 2017-10-16 DIAGNOSIS — E118 Type 2 diabetes mellitus with unspecified complications: Secondary | ICD-10-CM

## 2017-10-16 DIAGNOSIS — I252 Old myocardial infarction: Secondary | ICD-10-CM | POA: Diagnosis not present

## 2017-10-16 DIAGNOSIS — I7025 Atherosclerosis of native arteries of other extremities with ulceration: Secondary | ICD-10-CM | POA: Diagnosis not present

## 2017-10-16 LAB — CBC WITH DIFFERENTIAL/PLATELET
Abs Immature Granulocytes: 0.1 10*3/uL (ref 0.0–0.1)
BASOS ABS: 0.1 10*3/uL (ref 0.0–0.1)
Basophils Relative: 0 %
EOS PCT: 0 %
Eosinophils Absolute: 0.1 10*3/uL (ref 0.0–0.7)
HCT: 33.5 % — ABNORMAL LOW (ref 39.0–52.0)
HEMOGLOBIN: 10.7 g/dL — AB (ref 13.0–17.0)
Immature Granulocytes: 1 %
LYMPHS PCT: 6 %
Lymphs Abs: 1.1 10*3/uL (ref 0.7–4.0)
MCH: 28.3 pg (ref 26.0–34.0)
MCHC: 31.9 g/dL (ref 30.0–36.0)
MCV: 88.6 fL (ref 78.0–100.0)
MONO ABS: 1.2 10*3/uL — AB (ref 0.1–1.0)
Monocytes Relative: 7 %
Neutro Abs: 16.1 10*3/uL — ABNORMAL HIGH (ref 1.7–7.7)
Neutrophils Relative %: 86 %
Platelets: 519 10*3/uL — ABNORMAL HIGH (ref 150–400)
RBC: 3.78 MIL/uL — ABNORMAL LOW (ref 4.22–5.81)
RDW: 14.6 % (ref 11.5–15.5)
WBC: 18.6 10*3/uL — ABNORMAL HIGH (ref 4.0–10.5)

## 2017-10-16 LAB — BASIC METABOLIC PANEL
Anion gap: 9 (ref 5–15)
BUN: 16 mg/dL (ref 8–23)
CALCIUM: 9 mg/dL (ref 8.9–10.3)
CO2: 20 mmol/L — ABNORMAL LOW (ref 22–32)
Chloride: 100 mmol/L (ref 98–111)
Creatinine, Ser: 1.11 mg/dL (ref 0.61–1.24)
GFR calc Af Amer: >60 mL/min (ref >60)
GLUCOSE: 242 mg/dL — AB (ref 70–99)
POTASSIUM: 4.9 mmol/L (ref 3.5–5.1)
Sodium: 129 mmol/L — ABNORMAL LOW (ref 135–145)

## 2017-10-16 LAB — HEMOGLOBIN A1C
HEMOGLOBIN A1C: 7 % — AB (ref 4.8–5.6)
MEAN PLASMA GLUCOSE: 154.2 mg/dL

## 2017-10-16 LAB — I-STAT CG4 LACTIC ACID, ED
LACTIC ACID, VENOUS: 1.96 mmol/L — AB (ref 0.5–1.9)
Lactic Acid, Venous: 2.17 mmol/L (ref 0.5–1.9)

## 2017-10-16 LAB — PREALBUMIN: PREALBUMIN: 9 mg/dL — AB (ref 18–38)

## 2017-10-16 LAB — SEDIMENTATION RATE: Sed Rate: 83 mm/hr — ABNORMAL HIGH (ref 0–16)

## 2017-10-16 LAB — C-REACTIVE PROTEIN: CRP: 19.1 mg/dL — AB (ref <1.0)

## 2017-10-16 MED ORDER — ACETAMINOPHEN 325 MG PO TABS: 650.0000 mg | ORAL_TABLET | Freq: Four times a day (QID) | ORAL | Status: DC | PRN

## 2017-10-16 MED ORDER — ACETAMINOPHEN 650 MG RE SUPP: 650.0000 mg | Freq: Four times a day (QID) | RECTAL | Status: DC | PRN

## 2017-10-16 MED ORDER — INSULIN ASPART 100 UNIT/ML ~~LOC~~ SOLN
0.0000 [IU] | Freq: Three times a day (TID) | SUBCUTANEOUS | Status: DC
  Administered 2017-10-17 – 2017-10-18 (×3): 2 [IU] via SUBCUTANEOUS
  Administered 2017-10-18: 5 [IU] via SUBCUTANEOUS
  Administered 2017-10-18: 2 [IU] via SUBCUTANEOUS
  Administered 2017-10-19 (×2): 3 [IU] via SUBCUTANEOUS
  Administered 2017-10-19: 2 [IU] via SUBCUTANEOUS
  Administered 2017-10-20 (×3): 3 [IU] via SUBCUTANEOUS
  Administered 2017-10-21 (×2): 8 [IU] via SUBCUTANEOUS
  Administered 2017-10-21: 11 [IU] via SUBCUTANEOUS
  Administered 2017-10-22: 8 [IU] via SUBCUTANEOUS
  Administered 2017-10-22: 5 [IU] via SUBCUTANEOUS
  Administered 2017-10-22 – 2017-10-23 (×2): 3 [IU] via SUBCUTANEOUS
  Administered 2017-10-23 (×2): 11 [IU] via SUBCUTANEOUS
  Administered 2017-10-24: 2 [IU] via SUBCUTANEOUS
  Administered 2017-10-24: 3 [IU] via SUBCUTANEOUS
  Administered 2017-10-24: 11 [IU] via SUBCUTANEOUS
  Administered 2017-10-25 (×2): 5 [IU] via SUBCUTANEOUS

## 2017-10-16 MED ORDER — SIMVASTATIN 40 MG PO TABS
40.0000 mg | ORAL_TABLET | Freq: Every day | ORAL | Status: DC
  Administered 2017-10-17 – 2017-10-24 (×9): 40 mg via ORAL
  Filled 2017-10-16 (×9): qty 1

## 2017-10-16 MED ORDER — ONDANSETRON HCL 4 MG/2ML IJ SOLN: 4.0000 mg | Freq: Four times a day (QID) | INTRAMUSCULAR | Status: DC | PRN

## 2017-10-16 MED ORDER — LACTATED RINGERS IV BOLUS: 500.0000 mL | Freq: Once | INTRAVENOUS | Status: DC

## 2017-10-16 MED ORDER — FENOFIBRATE 160 MG PO TABS
160.0000 mg | ORAL_TABLET | Freq: Every day | ORAL | Status: DC
  Administered 2017-10-17 – 2017-10-25 (×9): 160 mg via ORAL
  Filled 2017-10-16 (×9): qty 1

## 2017-10-16 MED ORDER — METOPROLOL TARTRATE 12.5 MG HALF TABLET
12.5000 mg | ORAL_TABLET | Freq: Two times a day (BID) | ORAL | Status: DC
  Administered 2017-10-17 – 2017-10-25 (×18): 12.5 mg via ORAL
  Filled 2017-10-16 (×18): qty 1

## 2017-10-16 MED ORDER — ENOXAPARIN SODIUM 40 MG/0.4ML ~~LOC~~ SOLN
40.0000 mg | SUBCUTANEOUS | Status: DC
  Administered 2017-10-17 – 2017-10-21 (×6): 40 mg via SUBCUTANEOUS
  Filled 2017-10-16 (×6): qty 0.4

## 2017-10-16 MED ORDER — SODIUM CHLORIDE 0.9 % IV BOLUS: 500.0000 mL | Freq: Once | INTRAVENOUS | Status: DC

## 2017-10-16 MED ORDER — CLINDAMYCIN PHOSPHATE 600 MG/50ML IV SOLN
600.0000 mg | Freq: Once | INTRAVENOUS | Status: AC
  Administered 2017-10-16: 600 mg via INTRAVENOUS
  Filled 2017-10-16: qty 50

## 2017-10-16 MED ORDER — HYDROCODONE-ACETAMINOPHEN 5-325 MG PO TABS
1.0000 | ORAL_TABLET | Freq: Four times a day (QID) | ORAL | Status: DC | PRN
  Administered 2017-10-17 – 2017-10-25 (×20): 1 via ORAL
  Filled 2017-10-16 (×22): qty 1

## 2017-10-16 MED ORDER — PIPERACILLIN-TAZOBACTAM 3.375 G IVPB 30 MIN
3.3750 g | Freq: Once | INTRAVENOUS | Status: AC
  Administered 2017-10-16: 3.375 g via INTRAVENOUS
  Filled 2017-10-16: qty 50

## 2017-10-16 MED ORDER — ONDANSETRON HCL 4 MG PO TABS: 4.0000 mg | ORAL_TABLET | Freq: Four times a day (QID) | ORAL | Status: DC | PRN

## 2017-10-16 MED ORDER — VANCOMYCIN HCL 10 G IV SOLR: 1250.0000 mg | INTRAVENOUS | Status: DC

## 2017-10-16 MED ORDER — PIPERACILLIN-TAZOBACTAM 3.375 G IVPB
3.3750 g | Freq: Three times a day (TID) | INTRAVENOUS | Status: DC
  Administered 2017-10-17 – 2017-10-24 (×22): 3.375 g via INTRAVENOUS
  Filled 2017-10-16 (×23): qty 50

## 2017-10-16 MED ORDER — VANCOMYCIN HCL 10 G IV SOLR
1250.0000 mg | Freq: Once | INTRAVENOUS | Status: AC
  Administered 2017-10-16: 1250 mg via INTRAVENOUS
  Filled 2017-10-16: qty 1250

## 2017-10-16 MED ORDER — SODIUM CHLORIDE 0.9 % IV BOLUS
500.0000 mL | Freq: Once | INTRAVENOUS | Status: AC
  Administered 2017-10-16: 500 mL via INTRAVENOUS

## 2017-10-16 NOTE — ED Notes (Signed)
Dr Juleen ChinaKohut informed of lactic acid results 2.17

## 2017-10-16 NOTE — ED Notes (Signed)
Report given to Roj, RN.

## 2017-10-16 NOTE — Progress Notes (Signed)
Pharmacy Antibiotic Note  Daniel Tyler is a 77 y.o. male admitted on 10/16/2017 with wound infection.  Pharmacy has been consulted for Vancomycin and zosyn dosing. Patient was planned to have an amputation of his great right toe but presented to ED early due to worsening pain. WBC 18.6. LA 1.96>2.17, SCr 1.11. CrCl ~ 55 mL/min  Plan: -Vancomycin 1250 mg IV Q 24 hours  -Zosyn 3.375 gm IV Q 8 hours -Monitor CBC, renal fx, cultures and clinical progress -VT at SS   Height: 5\' 9"  (175.3 cm) Weight: 130 lb (59 kg) IBW/kg (Calculated) : 70.7  Temp (24hrs), Avg:98.3 F (36.8 C), Min:98.3 F (36.8 C), Max:98.3 F (36.8 C)  Recent Labs  Lab 10/16/17 1842 10/16/17 1849 10/16/17 2157  WBC 18.6*  --   --   CREATININE 1.11  --   --   LATICACIDVEN  --  1.96* 2.17*    Estimated Creatinine Clearance: 46.5 mL/min (by C-G formula based on SCr of 1.11 mg/dL).    No Known Allergies  Antimicrobials this admission: Vanc 8/23 >>  Zosyn 8/23 >>   Dose adjustments this admission: None   Microbiology results: 8/23 BCx:   Thank you for allowing pharmacy to be a part of this patient's care.  Vinnie LevelBenjamin Ashwin Tibbs, PharmD., BCPS Clinical Pharmacist Clinical phone for 10/16/17 until 8AM: [phone]

## 2017-10-16 NOTE — ED Notes (Signed)
Pt called out reporting needing to use a urinal. Pt stood with assistance. Pt noted to be unsteady on his feet. Pt unable to use urinal, but instructed to stay seated to use urinal. Railing left down but pt verbally confirmed he understood not to get up because of fall risk precautions. Pt verbally confirmed he would use the urinal by sitting up and not standing.

## 2017-10-16 NOTE — ED Provider Notes (Signed)
Patient placed in Quick Look pathway, seen and evaluated   Chief Complaint:Black toe  HPI:   Patient presents today for concern of a toe infection.  He reports that this morning he noticed that his right great toe was black.  He has a history of diabetes and has had previous toe amputations in the past.  ROS: No fevers at home  Physical Exam:   Gen: No distress  Neuro: Awake and Alert  Skin: Warm    Focused Exam: Right great toe is gangrenous with possible mold growing on the tip.  2+ right DP/PT pulses.  They see clinical images.        Initiation of care has begun. The patient has been counseled on the process, plan, and necessity for staying for the completion/evaluation, and the remainder of the medical screening examination    Norman ClayHammond, Marquis Down W, PA-C 10/16/17 1839    Raeford RazorKohut, Stephen, MD 10/22/17 1426

## 2017-10-16 NOTE — ED Provider Notes (Signed)
[facility] EMERGENCY DEPARTMENT Provider Note   CSN: [id number] Arrival date & time: 10/16/17  1748     History   Chief Complaint Chief Complaint  Patient presents with  . Toe Infection    HPI [facility] is a 77 y.o. male.  HPI 77 year old male with history of CAD, peripheral vascular disease, diabetes, hyperlipidemia presents to the emergency department today for evaluation of pain in his right great toe.  States that it has had black necrosis for several weeks however he has noticed increased pain and redness at the base of the toe.  States the pain has been worse over the past 2 days and had difficulty sleeping last night secondary to pain.  Denies any fevers.  States that he has been followed by podiatry for this toe and has been referred to vascular surgery however has not yet followed up.  Has seen vascular surgery and had fourth digit on the right foot amputated in the past.  States that he has a plan for amputation of this great toe however due to worsening pain came to the emergency department today.  No recent antibiotics.  No falls or traumas. No other concerns at this time. Lives at home alone.   Past Medical History:  Diagnosis Date  . Anemia   . Carotid stenosis   . Coronary artery disease   . Depression    bc of foot pain but no meds required  . Diabetes mellitus    takes Glipizide and Metformin daily;Lantus nightly  . Enlarged heart   . Hyperlipidemia   . Myocardial infarction (HCC) 2013  . Pneumonia 2013   hx of  . Skin spots-aging   . Weakness of right leg     Patient Active Problem List   Diagnosis Date Noted  . Gangrene of toe of right foot (HCC) 10/16/2017  . Cellulitis of great toe of right foot 10/16/2017  . Osteomyelitis of great toe of right foot (HCC) 10/16/2017  . Syncope 07/20/2015  . Aftercare following surgery of the circulatory system, NEC 09/09/2012  . Carotid artery stenosis 09/09/2012  . Peripheral vascular  disease, unspecified (HCC) 07/29/2012  . Atherosclerosis of native arteries of the extremities with ulceration (HCC) 07/08/2012  . Type II or unspecified type diabetes mellitus with peripheral circulatory disorders, uncontrolled(250.72) 07/08/2012  . Occlusion and stenosis of carotid artery without mention of cerebral infarction 07/24/2011  . Hypertension   . COPD (chronic obstructive pulmonary disease) (HCC)   . Anemia due to blood loss, acute   . S/P CABG (coronary artery bypass graft) 07/01/2011  . Carotid artery disease (HCC) 07/01/2011  . Depression 06/07/2011  . Dyslipidemia   . CAD (coronary artery disease) 04/24/2011  . Insulin dependent type 2 diabetes mellitus, controlled (HCC)     Past Surgical History:  Procedure Laterality Date  . ABDOMINAL AORTAGRAM    . ABDOMINAL AORTAGRAM N/A 07/09/2012   Procedure: ABDOMINAL Ronny Flurry;  Surgeon: Sherren Kerns, MD;  Location: [facility] CATH LAB;  Service: Cardiovascular;  Laterality: N/A;  . AMPUTATION Right 07/14/2012   Procedure: AMPUTATION DIGIT;  Surgeon: Sherren Kerns, MD;  Location: [facility] OR;  Service: Vascular;  Laterality: Right;  Amputation Right Fourth Toe  . CARDIAC CATHETERIZATION  2013  . CAROTID ENDARTERECTOMY  07/01/11   Right CEA  . COLONOSCOPY    . CORONARY ARTERY BYPASS GRAFT  07/01/2011   Procedure: CORONARY ARTERY BYPASS GRAFTING (CABG);  Surgeon: Loreli Slot, MD;  Location: [facility] OR;  Service: Open Heart Surgery;  Laterality: N/A;  . ENDARTERECTOMY  07/01/2011   Procedure: ENDARTERECTOMY CAROTID;  Surgeon: Sherren Kerns, MD;  Location: [facility] OR;  Service: Vascular;  Laterality: Right;  . FEMORAL-POPLITEAL BYPASS GRAFT Right 07/14/2012   Procedure: BYPASS GRAFT FEMORAL-POPLITEAL ARTERY;  Surgeon: Sherren Kerns, MD;  Location: [facility] OR;  Service: Vascular;  Laterality: Right;  Right Femoral Popliteal Bypass with Left Leg Nonreversed Greater Saphenous Vein  . INTRAOPERATIVE ARTERIOGRAM Right 07/14/2012   Procedure: INTRA  OPERATIVE ARTERIOGRAM;  Surgeon: Sherren Kerns, MD;  Location: [facility] OR;  Service: Vascular;  Laterality: Right;  . LEFT HEART CATHETERIZATION WITH CORONARY ANGIOGRAM N/A 04/24/2011   Procedure: LEFT HEART CATHETERIZATION WITH CORONARY ANGIOGRAM;  Surgeon: Othella Boyer, MD;  Location: [facility] CATH LAB;  Service: Cardiovascular;  Laterality: N/A;        Home Medications    Prior to Admission medications   Medication Sig Start Date End Date Taking? Authorizing Provider  acetaminophen (TYLENOL) 500 MG tablet Take 1,000-1,500 mg by mouth daily as needed for mild pain.    [provider]  fenofibrate 160 MG tablet Take 160 mg by mouth daily. 06/23/15   [provider]  furosemide (LASIX) 40 MG tablet Take 40 mg by mouth daily.    [provider]  glipiZIDE (GLUCOTROL) 5 MG tablet Take 5 mg by mouth 2 (two) times daily before a meal.    [provider]  magnesium oxide (MAG-OX) 400 (241.3 Mg) MG tablet Take 1 tablet (400 mg total) by mouth 2 (two) times daily. 07/21/15   Dorothea Ogle, MD  metFORMIN (GLUCOPHAGE) 1000 MG tablet TAKE ONE TABLET BY MOUTH TWICE DAILY FOR  DIABETES 10/21/12   Renato Gails, Tiffany L, DO  metoprolol tartrate (LOPRESSOR) 25 MG tablet Take 12.5 mg by mouth 2 (two) times daily. 03/06/15   [provider]  simvastatin (ZOCOR) 40 MG tablet Take 40 mg by mouth at bedtime. 06/12/15   [provider]    Family History Family History  Problem Relation Age of Onset  . Heart disease Father   . Cancer Brother   . Diabetes Brother   . Diabetes Daughter   . Anesthesia problems Neg Hx   . Hypotension Neg Hx   . Malignant hyperthermia Neg Hx   . Pseudochol deficiency Neg Hx     Social History Social History   Tobacco Use  . Smoking status: Former Smoker    Packs/day: 1.00    Years: 20.00    Pack years: 20.00    Types: Cigarettes    Last attempt to quit: 02/24/1978    Years since quitting: 39.6  . Smokeless tobacco: Never Used    Substance Use Topics  . Alcohol use: No  . Drug use: No     Allergies   Patient has no known allergies.   Review of Systems Review of Systems  Constitutional: Negative for chills and fever.  HENT: Negative for congestion.   Respiratory: Negative for cough and shortness of breath.   Cardiovascular: Negative for chest pain and leg swelling.  Gastrointestinal: Negative for abdominal pain, diarrhea, nausea and vomiting.  Genitourinary: Negative for dysuria and hematuria.  Musculoskeletal: Negative for back pain and neck pain.       Pain and swelling right great toe.   Skin: Positive for color change (necrosis right great toe).  Neurological: Negative for weakness, numbness and headaches.  All other systems reviewed and are negative.    Physical Exam Updated  Vital Signs BP (!) 142/92   Pulse 81   Temp 98.3 F (36.8 C) (Oral)   Resp (!) 28   Ht 5\' 9"  (1.753 m)   Wt 59 kg   SpO2 95%   BMI 19.20 kg/m   Physical Exam  Constitutional: No distress.  HENT:  Head: Normocephalic and atraumatic.  Eyes: Conjunctivae are normal. Right eye exhibits no discharge. Left eye exhibits no discharge.  Neck: Normal range of motion. No tracheal deviation present.  Cardiovascular: Regular rhythm, normal heart sounds and intact distal pulses.  Pulmonary/Chest: Effort normal and breath sounds normal. No respiratory distress.  Abdominal: Soft. Bowel sounds are normal. He exhibits no distension. There is no tenderness.  Musculoskeletal: He exhibits no edema or deformity.  Patient has palpable DP pulses bilaterally.  He has a necrotic right right toe that appears to be dry gangrene and states it is unchanged over the past couple of weeks.  He does have increased erythema and tenderness at the base of the right dry gangrene where it meets intact tissue.  No crepitus.  Pain is limited to the MTP distally.  No streaking.  No pain in the ankle.  Full range of motion at the ankle.  Neurological: He is  alert. He exhibits normal muscle tone.  Skin: No rash noted. He is not diaphoretic.  Psychiatric: He has a normal mood and affect.  Nursing note and vitals reviewed.        ED Treatments / Results  Labs (all labs ordered are listed, but only abnormal results are displayed) Labs Reviewed  BASIC METABOLIC PANEL - Abnormal; Notable for the following components:      Result Value   Sodium 129 (*)    CO2 20 (*)    Glucose, Bld 242 (*)    All other components within normal limits  CBC WITH DIFFERENTIAL/PLATELET - Abnormal; Notable for the following components:   WBC 18.6 (*)    RBC 3.78 (*)    Hemoglobin 10.7 (*)    HCT 33.5 (*)    Platelets 519 (*)    Neutro Abs 16.1 (*)    Monocytes Absolute 1.2 (*)    All other components within normal limits  I-STAT CG4 LACTIC ACID, ED - Abnormal; Notable for the following components:   Lactic Acid, Venous 1.96 (*)    All other components within normal limits  I-STAT CG4 LACTIC ACID, ED - Abnormal; Notable for the following components:   Lactic Acid, Venous 2.17 (*)    All other components within normal limits  CULTURE, BLOOD (ROUTINE X 2)  CULTURE, BLOOD (ROUTINE X 2)  SEDIMENTATION RATE  C-REACTIVE PROTEIN  HEMOGLOBIN A1C  HIV ANTIBODY (ROUTINE TESTING)  PREALBUMIN    EKG None  Radiology Dg Foot Complete Right  Result Date: 10/16/2017 CLINICAL DATA:  Right great toe gangrene. EXAM: RIGHT FOOT COMPLETE - 3+ VIEW COMPARISON:  None. FINDINGS: Prior amputation of the fourth toe. Suspected gas in the webspace between the first and second digits. Potential cortical demineralization along the midshaft of the proximal phalanx great toe. No overt bony destructive findings. Abnormal soft tissue swelling medial to the first MTP joint. There is also diffuse subcutaneous edema. No malalignment at the Lisfranc joint. Fast flow calcifications are observed. There is dorsal subcutaneous edema along the forefoot. IMPRESSION: 1. Abnormal gas along the  webspace between the first and second toes, suspicious for soft tissue infection. 2. There is some cortical demineralization in the midshaft of the proximal phalanx great toe  which may be a sign of early osteomyelitis. 3. Soft tissue swelling medial to the first MTP joint. 4. Prior amputation of the fourth toe. 5. Dorsal subcutaneous edema in the foot. Electronically Signed   By: Gaylyn Rong [name]   On: 10/16/2017 20:14    Procedures Procedures (including critical care time)  Medications Ordered in ED Medications  clindamycin (CLEOCIN) IVPB 600 mg (600 mg Intravenous New Bag/Given 10/16/17 2212)  vancomycin (VANCOCIN) 1,250 mg in sodium chloride 0.9 % 250 mL IVPB (has no administration in time range)  sodium chloride 0.9 % bolus 500 mL (500 mLs Intravenous New Bag/Given 10/16/17 2213)  insulin aspart (novoLOG) injection 0-15 Units (has no administration in time range)  fenofibrate tablet 160 mg (has no administration in time range)  simvastatin (ZOCOR) tablet 40 mg (has no administration in time range)  metoprolol tartrate (LOPRESSOR) tablet 12.5 mg (has no administration in time range)  piperacillin-tazobactam (ZOSYN) IVPB 3.375 g (3.375 g Intravenous New Bag/Given 10/16/17 2205)     Initial Impression / Assessment and Plan / ED Course  I have reviewed the triage vital signs and the nursing notes.  Pertinent labs & imaging results that were available during my care of the patient were reviewed by me and considered in my medical decision making (see chart for details).    77 year old male with history of CAD, peripheral vascular disease, diabetes, hyperlipidemia presents to the emergency department today for evaluation of pain in his right great toe.  Patient afebrile, hemodynamically stable at presentation.  History exam as detailed and imaged above.  Concern for acutely infected toe.  No signs of neck testing fasciitis at this time.  X-ray obtained that is concerning for possible  osteomyelitis as well as small amount of gas.  Also with surrounding cellulitis. He has leukocytosis of > 18,000.  We will start antibiotics.  Clindamycin given as well as vancomycin/Zosyn after obtaining blood cultures.  Does have mild lactic acid elevation.  Will give IV fluids.  Nontoxic-appearing.  Stable blood pressure.  Does not require 30 cc/KG IV fluids at this time given stable blood pressure and lactic acid. Metabolic panel remarkable for stable renal function. Hyponatremic.  Spoke with vascular surgery who will coordinate vascular studies.  Recommend admission for IV antibiotics to hospitalist team.  I spoke with hospitalist team who will admit to their service for continued antibiotics.  They evaluated pt in ED. Patient comfortable with this plan.  Stable in ED with no acute events.  Case and plan of care discussed with Dr. Juleen China.  Final Clinical Impressions(s) / ED Diagnoses   Final diagnoses:  Osteomyelitis of great toe of right foot [facility])    ED Discharge Orders    None       Rigoberto Noel, MD 10/16/17 2239    Raeford Razor, MD 10/22/17 1428

## 2017-10-16 NOTE — ED Triage Notes (Signed)
Pt presents to ED with right great toe gangrene. Pt states its been black for 2 days. Pt has hx of his 4th toe removed on the same foot. Denies pain or fever.

## 2017-10-16 NOTE — H&P (Signed)
History and Physical    [facility] WUX:[id number] DOB: 06/21/1940 DOA: 10/16/2017  PCP: Dois Davenport, MD  Patient coming from: Home  I have personally briefly reviewed patient's old medical records in [facility]  Chief Complaint: Gangrene of toe  HPI: [name] is a 77 y.o. male with medical history significant of DM2, HTN, PAD, prior toe amputations.  Patient presents to the ED with worsening pain from gangrene of his R great toe.  Had made plans already with vascular surgery to have it amputated in future, but pain became worse and he noticed increased redness to his toe this morning so presents to the ED.  No fevers.   ED Course: WBC 18k.  Put on zosyn / vanc / clinda.  X ray shows osteo and gas gangrene of toe.   Review of Systems: As per HPI otherwise 10 point review of systems negative.   Past Medical History:  Diagnosis Date  . Anemia   . Carotid stenosis   . Coronary artery disease   . Depression    bc of foot pain but no meds required  . Diabetes mellitus    takes Glipizide and Metformin daily;Lantus nightly  . Enlarged heart   . Hyperlipidemia   . Myocardial infarction (HCC) 2013  . Pneumonia 2013   hx of  . Skin spots-aging   . Weakness of right leg     Past Surgical History:  Procedure Laterality Date  . ABDOMINAL AORTAGRAM    . ABDOMINAL AORTAGRAM N/A 07/09/2012   Procedure: ABDOMINAL Ronny Flurry;  Surgeon: Sherren Kerns, MD;  Location: [facility] CATH LAB;  Service: Cardiovascular;  Laterality: N/A;  . AMPUTATION Right 07/14/2012   Procedure: AMPUTATION DIGIT;  Surgeon: Sherren Kerns, MD;  Location: [facility] OR;  Service: Vascular;  Laterality: Right;  Amputation Right Fourth Toe  . CARDIAC CATHETERIZATION  2013  . CAROTID ENDARTERECTOMY  07/01/11   Right CEA  . COLONOSCOPY    . CORONARY ARTERY BYPASS GRAFT  07/01/2011   Procedure: CORONARY ARTERY BYPASS GRAFTING (CABG);  Surgeon: Loreli Slot, MD;  Location: [facility] OR;  Service: Open Heart  Surgery;  Laterality: N/A;  . ENDARTERECTOMY  07/01/2011   Procedure: ENDARTERECTOMY CAROTID;  Surgeon: Sherren Kerns, MD;  Location: [facility] OR;  Service: Vascular;  Laterality: Right;  . FEMORAL-POPLITEAL BYPASS GRAFT Right 07/14/2012   Procedure: BYPASS GRAFT FEMORAL-POPLITEAL ARTERY;  Surgeon: Sherren Kerns, MD;  Location: [facility] OR;  Service: Vascular;  Laterality: Right;  Right Femoral Popliteal Bypass with Left Leg Nonreversed Greater Saphenous Vein  . INTRAOPERATIVE ARTERIOGRAM Right 07/14/2012   Procedure: INTRA OPERATIVE ARTERIOGRAM;  Surgeon: Sherren Kerns, MD;  Location: [facility] OR;  Service: Vascular;  Laterality: Right;  . LEFT HEART CATHETERIZATION WITH CORONARY ANGIOGRAM N/A 04/24/2011   Procedure: LEFT HEART CATHETERIZATION WITH CORONARY ANGIOGRAM;  Surgeon: Othella Boyer, MD;  Location: [facility] CATH LAB;  Service: Cardiovascular;  Laterality: N/A;     reports that he quit smoking about 39 years ago. His smoking use included cigarettes. He has a 20.00 pack-year smoking history. He has never used smokeless tobacco. He reports that he does not drink alcohol or use drugs.  No Known Allergies  Family History  Problem Relation Age of Onset  . Heart disease Father   . Cancer Brother   . Diabetes Brother   . Diabetes Daughter   . Anesthesia problems Neg Hx   . Hypotension Neg Hx   . Malignant hyperthermia Neg Hx   .  Pseudochol deficiency Neg Hx      Prior to Admission medications   Medication Sig Start Date End Date Taking? Authorizing Provider  acetaminophen (TYLENOL) 500 MG tablet Take 1,000-1,500 mg by mouth daily as needed for mild pain.    [provider]  fenofibrate 160 MG tablet Take 160 mg by mouth daily. 06/23/15   [provider]  furosemide (LASIX) 40 MG tablet Take 40 mg by mouth daily.    [provider]  glipiZIDE (GLUCOTROL) 5 MG tablet Take 5 mg by mouth 2 (two) times daily before a meal.    [provider]  magnesium oxide (MAG-OX)  400 (241.3 Mg) MG tablet Take 1 tablet (400 mg total) by mouth 2 (two) times daily. 07/21/15   Dorothea OgleMyers, Iskra M, MD  metFORMIN (GLUCOPHAGE) 1000 MG tablet TAKE ONE TABLET BY MOUTH TWICE DAILY FOR  DIABETES 10/21/12   Renato Gailseed, Tiffany L, DO  metoprolol tartrate (LOPRESSOR) 25 MG tablet Take 12.5 mg by mouth 2 (two) times daily. 03/06/15   [provider]  simvastatin (ZOCOR) 40 MG tablet Take 40 mg by mouth at bedtime. 06/12/15   [provider]    Physical Exam: Vitals:   10/16/17 2130 10/16/17 2132 10/16/17 2145 10/16/17 2215  BP: 118/75 118/75 128/72 (!) 142/92  Pulse:  (!) 57  81  Resp:  (!) 28    Temp:      TempSrc:      SpO2:  95%  95%  Weight:      Height:        Constitutional: NAD, calm, comfortable Eyes: PERRL, lids and conjunctivae normal ENMT: Mucous membranes are moist. Posterior pharynx clear of any exudate or lesions.Normal dentition.  Neck: normal, supple, no masses, no thyromegaly Respiratory: clear to auscultation bilaterally, no wheezing, no crackles. Normal respiratory effort. No accessory muscle use.  Cardiovascular: Regular rate and rhythm, no murmurs / rubs / gallops. No extremity edema. 2+ pedal pulses. No carotid bruits.  Abdomen: no tenderness, no masses palpated. No hepatosplenomegaly. Bowel sounds positive.  Musculoskeletal: no clubbing / cyanosis. No joint deformity upper and lower extremities. Good ROM, no contractures. Normal muscle tone.  Skin:    Neurologic: CN 2-12 grossly intact. Sensation intact, DTR normal. Strength 5/5 in all 4.  Psychiatric: Normal judgment and insight. Alert and oriented x 3. Normal mood.    Labs on Admission: I have personally reviewed following labs and imaging studies  CBC: Recent Labs  Lab 10/16/17 1842  WBC 18.6*  NEUTROABS 16.1*  HGB 10.7*  HCT 33.5*  MCV 88.6  PLT 519*   Basic Metabolic Panel: Recent Labs  Lab 10/16/17 1842  NA 129*  K 4.9  CL 100  CO2 20*  GLUCOSE 242*  BUN 16    CREATININE 1.11  CALCIUM 9.0   GFR: Estimated Creatinine Clearance: 46.5 mL/min (by C-G formula based on SCr of 1.11 mg/dL). Liver Function Tests: No results for input(s): AST, ALT, ALKPHOS, BILITOT, PROT, ALBUMIN in the last 168 hours. No results for input(s): LIPASE, AMYLASE in the last 168 hours. No results for input(s): AMMONIA in the last 168 hours. Coagulation Profile: No results for input(s): INR, PROTIME in the last 168 hours. Cardiac Enzymes: No results for input(s): CKTOTAL, CKMB, CKMBINDEX, TROPONINI in the last 168 hours. BNP (last 3 results) No results for input(s): PROBNP in the last 8760 hours. HbA1C: No results for input(s): HGBA1C in the last 72 hours. CBG: No results for input(s): GLUCAP in the last 168 hours.  Lipid Profile: No results for input(s): CHOL, HDL, LDLCALC, TRIG, CHOLHDL, LDLDIRECT in the last 72 hours. Thyroid Function Tests: No results for input(s): TSH, T4TOTAL, FREET4, T3FREE, THYROIDAB in the last 72 hours. Anemia Panel: No results for input(s): VITAMINB12, FOLATE, FERRITIN, TIBC, IRON, RETICCTPCT in the last 72 hours. Urine analysis:    Component Value Date/Time   COLORURINE YELLOW 07/20/2015 2230   APPEARANCEUR CLEAR 07/20/2015 2230   LABSPEC 1.012 07/20/2015 2230   PHURINE 5.0 07/20/2015 2230   GLUCOSEU NEGATIVE 07/20/2015 2230   HGBUR NEGATIVE 07/20/2015 2230   BILIRUBINUR NEGATIVE 07/20/2015 2230   KETONESUR NEGATIVE 07/20/2015 2230   PROTEINUR NEGATIVE 07/20/2015 2230   UROBILINOGEN 1.0 07/13/2012 1445   NITRITE NEGATIVE 07/20/2015 2230   LEUKOCYTESUR NEGATIVE 07/20/2015 2230    Radiological Exams on Admission: Dg Foot Complete Right  Result Date: 10/16/2017 CLINICAL DATA:  Right great toe gangrene. EXAM: RIGHT FOOT COMPLETE - 3+ VIEW COMPARISON:  None. FINDINGS: Prior amputation of the fourth toe. Suspected gas in the webspace between the first and second digits. Potential cortical demineralization along the midshaft of the  proximal phalanx great toe. No overt bony destructive findings. Abnormal soft tissue swelling medial to the first MTP joint. There is also diffuse subcutaneous edema. No malalignment at the Lisfranc joint. Fast flow calcifications are observed. There is dorsal subcutaneous edema along the forefoot. IMPRESSION: 1. Abnormal gas along the webspace between the first and second toes, suspicious for soft tissue infection. 2. There is some cortical demineralization in the midshaft of the proximal phalanx great toe which may be a sign of early osteomyelitis. 3. Soft tissue swelling medial to the first MTP joint. 4. Prior amputation of the fourth toe. 5. Dorsal subcutaneous edema in the foot. Electronically Signed   By: Gaylyn Rong [name]   On: 10/16/2017 20:14    EKG: Independently reviewed.  Assessment/Plan Principal Problem:   Osteomyelitis of great toe of right foot (HCC) Active Problems:   Insulin dependent type 2 diabetes mellitus, controlled (HCC)   Hypertension   Atherosclerosis of native arteries of the extremities with ulceration (HCC)   Gangrene of toe of right foot (HCC)   Cellulitis of great toe of right foot    1. Dry and wet gangrene of R great toe with more proximal cellulitis - associated WBC 18k 1. Foot ulcer pathway 2. EDP started zosyn / vanc / clinda.  Will go ahead and stop clinda and just continue zosyn / vanc for now. 3. Cultures pending 4. EDP spoke with vascular, sounds like they are going to do vascular study this weekend, maybe surgery on Monday? 5. Norco PRN pain 2. DM2 - 1. Hold home hypoglycemics 2. SSI AC 3. HTN - 1. Cont metoprolol  DVT prophylaxis: Lovenox Code Status: Full Family Communication: No family in room Disposition Plan: Home after admit Consults called: EDP spoke with vascular Admission status: Admit to inpatient   Hillary Bow DO [facility] Pager [phone] Only works nights!  If 7AM-7PM, please contact the primary day  team physician taking care of patient  [email or web address] Password [facility]  10/16/2017, 10:42 PM

## 2017-10-17 ENCOUNTER — Encounter (HOSPITAL_COMMUNITY): Payer: Medicare Other

## 2017-10-17 ENCOUNTER — Other Ambulatory Visit: Payer: Self-pay

## 2017-10-17 ENCOUNTER — Encounter (HOSPITAL_COMMUNITY): Payer: Self-pay

## 2017-10-17 DIAGNOSIS — I998 Other disorder of circulatory system: Secondary | ICD-10-CM

## 2017-10-17 LAB — GLUCOSE, CAPILLARY
GLUCOSE-CAPILLARY: 141 mg/dL — AB (ref 70–99)
Glucose-Capillary: 94 mg/dL (ref 70–99)
Glucose-Capillary: 139 mg/dL — ABNORMAL HIGH (ref 70–99)
Glucose-Capillary: 150 mg/dL — ABNORMAL HIGH (ref 70–99)

## 2017-10-17 LAB — HIV ANTIBODY (ROUTINE TESTING W REFLEX): HIV Screen 4th Generation wRfx: NONREACTIVE

## 2017-10-17 MED ORDER — ADULT MULTIVITAMIN W/MINERALS CH
1.0000 | ORAL_TABLET | Freq: Every day | ORAL | Status: DC
  Administered 2017-10-18 – 2017-10-25 (×7): 1 via ORAL
  Filled 2017-10-17 (×8): qty 1

## 2017-10-17 MED ORDER — VANCOMYCIN HCL IN DEXTROSE 1-5 GM/200ML-% IV SOLN
1000.0000 mg | INTRAVENOUS | Status: DC
  Administered 2017-10-17 – 2017-10-20 (×4): 1000 mg via INTRAVENOUS
  Filled 2017-10-17 (×5): qty 200

## 2017-10-17 MED ORDER — ASPIRIN EC 81 MG PO TBEC
81.0000 mg | DELAYED_RELEASE_TABLET | Freq: Every day | ORAL | Status: DC
  Administered 2017-10-17 – 2017-10-25 (×8): 81 mg via ORAL
  Filled 2017-10-17 (×8): qty 1

## 2017-10-17 MED ORDER — ENSURE ENLIVE PO LIQD
237.0000 mL | Freq: Two times a day (BID) | ORAL | Status: DC
  Administered 2017-10-21 – 2017-10-25 (×9): 237 mL via ORAL

## 2017-10-17 NOTE — Progress Notes (Addendum)
Initial Nutrition Assessment  DOCUMENTATION CODES:  Non-severe (moderate) malnutrition in context of chronic illness  INTERVENTION:  Ensure Enlive po BID, each supplement provides 350 kcal and 20 grams of protein  Food requests noted   MVI with minerals  NUTRITION DIAGNOSIS:  Increased nutrient needs related to wound healing as evidenced by estimated nutritional requirements for this condition  GOAL:  Patient will meet greater than or equal to 90% of their needs  MONITOR:  PO intake, Supplement acceptance, Labs, Weight trends  REASON FOR ASSESSMENT:  Consult Wound healing  ASSESSMENT:  77 y/o male PMHx CAD s/p CABG, hld/htn, DM2, MI, prior toe amputations. Presents to ED w/ worsening gangrene of R toe. Imaging shows osteomyelitis and gas gangrene of R great toe w/ more proximal cellulitis. Placed on IV abx and admitted for amputation.    Patient poor historian. He says he eats well at home. He does NOT follow any type of therapeutic diet. In fact, he sounds to have an extremely poor diet quality. He says he eats at [employer] or [employer] for almost every meal and essentially every food he mentions is high in sugar, salt or fat. He does not seem to have any understanding of a healthy diet. He was unaware fried chicken was not good for his health.   RD asks if he took any vitamins. "I take all kinds of pills". He denies drinking any oral nutrition supplements, saying they are cost prohibitive.   Wt wise, he says his UBW is 135-140 lbs. Per care everywhere, pt was 132 lbs in June, but 148 lbs August of 2018. Unclear what transpired in interval. RD was unable to get bed scale to work today.   At this time, he denies any n/v/c/d. His only real complaint is his toe pain and the dislike of the food choices.   RD reviewed the importance of intake with impending surgery, infection and chronic disease states. He was agreeable to supplements. Bgs today have been 95-150. Given his  malnutrition, feel Ensure is appropriate at this juncture. RD noted food requests and will pass on.  Labs: a1c:7.0, Na:129, Albumin:3.5, Tg:184, crp:19.1 Meds: Insulin, IV abx, PRN hydrodone  Recent Labs  Lab 10/16/17 1842  NA 129*  K 4.9  CL 100  CO2 20*  BUN 16  CREATININE 1.11  CALCIUM 9.0  GLUCOSE 242*   NUTRITION - FOCUSED PHYSICAL EXAM:   Most Recent Value  Orbital Region  Moderate depletion  Upper Arm Region  Moderate depletion  Thoracic and Lumbar Region  Severe depletion  Buccal Region  Moderate depletion  Temple Region  Moderate depletion  Clavicle Bone Region  Moderate depletion  Clavicle and Acromion Bone Region  Moderate depletion  Scapular Bone Region  Moderate depletion  Dorsal Hand  Moderate depletion  Patellar Region  Moderate depletion  Anterior Thigh Region  Severe depletion  Posterior Calf Region  Moderate depletion  Edema (RD Assessment)  None  Hair  Reviewed  Eyes  Reviewed  Mouth  Reviewed  Skin  Reviewed  Nails  Reviewed     Diet Order:   Diet Order            Diet Carb Modified Fluid consistency: Thin; Room service appropriate? Yes  Diet effective now             EDUCATION NEEDS:  No education needs have been identified at this time  Skin: Necrotic R great toe   Last BM:  8/23  Height:  Ht  Readings from Last 1 Encounters:  10/16/17 5\' 9"  (1.753 m)   Weight:  Wt Readings from Last 1 Encounters:  10/16/17 59 kg   Wt Readings from Last 10 Encounters:  10/16/17 59 kg  07/20/15 63.7 kg  09/09/12 65.3 kg  08/06/12 64.7 kg  07/30/12 71.4 kg  07/29/12 70.3 kg  07/15/12 70.4 kg  07/13/12 72.6 kg  07/09/12 72.6 kg  07/08/12 72.6 kg   Ideal Body Weight:  72.73 kg  BMI:  Body mass index is 19.2 kg/m.  Estimated Nutritional Needs:  Kcal:  4098-1191 (34-38kcal/kg) Protein:  88-100g (1.5-1.7g/kg bw) Fluid:  >2 L fluid (63ml/kcal)  Christophe Louis RD, LDN, CNSC Clinical Nutrition Available Tues-Sat via Pager:  [phone] 10/17/2017 5:13 PM

## 2017-10-17 NOTE — Consult Note (Signed)
Hospital Consult    Reason for Consult: Gangrenous right great toe Referring Physician:  Dr. Clearnce Sorrel MRN #:  [id number]  History of Present Illness: This is a 77 y.o. male with history of right femoral-popliteal artery bypass in 2014 that was done with contralateral saphenous vein also had a fourth toe amputation at the time.  Appears to have been lost to follow-up.  Has been sent for evaluation to our office and has had noninvasive testing with pending office visit with Dr. Darrick Penna for gangrenous right great toe.  He began having erythema of his foot yesterday presented to the emergency department.  He is now on antibiotics.  He is feeling somewhat better.  Risk factors for vascular disease include former smoking, diabetes, hyperlipidemia.  He takes a statin drug as an outpatient no antiplatelets or blood thinners  Past Medical History:  Diagnosis Date  . Anemia   . Carotid stenosis   . Coronary artery disease   . Depression    bc of foot pain but no meds required  . Diabetes mellitus    takes Glipizide and Metformin daily;Lantus nightly  . Enlarged heart   . Hyperlipidemia   . Myocardial infarction (HCC) 2013  . Pneumonia 2013   hx of  . Skin spots-aging   . Weakness of right leg     Past Surgical History:  Procedure Laterality Date  . ABDOMINAL AORTAGRAM    . ABDOMINAL AORTAGRAM N/A 07/09/2012   Procedure: ABDOMINAL Ronny Flurry;  Surgeon: Sherren Kerns, MD;  Location: [facility] CATH LAB;  Service: Cardiovascular;  Laterality: N/A;  . AMPUTATION Right 07/14/2012   Procedure: AMPUTATION DIGIT;  Surgeon: Sherren Kerns, MD;  Location: [facility] OR;  Service: Vascular;  Laterality: Right;  Amputation Right Fourth Toe  . CARDIAC CATHETERIZATION  2013  . CAROTID ENDARTERECTOMY  07/01/11   Right CEA  . COLONOSCOPY    . CORONARY ARTERY BYPASS GRAFT  07/01/2011   Procedure: CORONARY ARTERY BYPASS GRAFTING (CABG);  Surgeon: Loreli Slot, MD;  Location: [facility] OR;  Service: Open Heart Surgery;   Laterality: N/A;  . ENDARTERECTOMY  07/01/2011   Procedure: ENDARTERECTOMY CAROTID;  Surgeon: Sherren Kerns, MD;  Location: [facility] OR;  Service: Vascular;  Laterality: Right;  . FEMORAL-POPLITEAL BYPASS GRAFT Right 07/14/2012   Procedure: BYPASS GRAFT FEMORAL-POPLITEAL ARTERY;  Surgeon: Sherren Kerns, MD;  Location[facility] OR;  Service: Vascular;  Laterality: Right;  Right Femoral Popliteal Bypass with Left Leg Nonreversed Greater Saphenous Vein  . INTRAOPERATIVE ARTERIOGRAM Right 07/14/2012   Procedure: INTRA OPERATIVE ARTERIOGRAM;  Surgeon: Sherren Kerns, MD;  Location: [facility] OR;  Service: Vascular;  Laterality: Right;  . LEFT HEART CATHETERIZATION WITH CORONARY ANGIOGRAM N/A 04/24/2011   Procedure: LEFT HEART CATHETERIZATION WITH CORONARY ANGIOGRAM;  Surgeon: Othella Boyer, MD;  Location: [facility] CATH LAB;  Service: Cardiovascular;  Laterality: N/A;    No Known Allergies  Prior to Admission medications   Medication Sig Start Date End Date Taking? Authorizing Provider  acetaminophen (TYLENOL) 500 MG tablet Take 1,000-1,500 mg by mouth daily as needed for mild pain.    [provider]  fenofibrate 160 MG tablet Take 160 mg by mouth daily. 06/23/15   [provider]  glipiZIDE (GLUCOTROL) 5 MG tablet Take 5 mg by mouth 2 (two) times daily before a meal.    [provider]  magnesium oxide (MAG-OX) 400 (241.3 Mg) MG tablet Take 1 tablet (400 mg total) by mouth 2 (two) times daily. 07/21/15   Izola Price,  Trevor Iha, MD  metFORMIN (GLUCOPHAGE) 1000 MG tablet TAKE ONE TABLET BY MOUTH TWICE DAILY FOR  DIABETES 10/21/12   Renato Gails, Tiffany L, DO  metoprolol tartrate (LOPRESSOR) 25 MG tablet Take 12.5 mg by mouth 2 (two) times daily. 03/06/15   [provider]  simvastatin (ZOCOR) 40 MG tablet Take 40 mg by mouth at bedtime. 06/12/15   [provider]    Social History   Socioeconomic History  . Marital status: Single    Spouse name: Not on file  . Number of children: Not on  file  . Years of education: Not on file  . Highest education level: Not on file  Occupational History  . Not on file  Social Needs  . Financial resource strain: Not on file  . Food insecurity:    Worry: Not on file    Inability: Not on file  . Transportation needs:    Medical: Not on file    Non-medical: Not on file  Tobacco Use  . Smoking status: Former Smoker    Packs/day: 1.00    Years: 20.00    Pack years: 20.00    Types: Cigarettes    Last attempt to quit: 02/24/1978    Years since quitting: 39.6  . Smokeless tobacco: Never Used  Substance and Sexual Activity  . Alcohol use: No  . Drug use: No  . Sexual activity: Never  Lifestyle  . Physical activity:    Days per week: Not on file    Minutes per session: Not on file  . Stress: Not on file  Relationships  . Social connections:    Talks on phone: Not on file    Gets together: Not on file    Attends religious service: Not on file    Active member of club or organization: Not on file    Attends meetings of clubs or organizations: Not on file    Relationship status: Not on file  . Intimate partner violence:    Fear of current or ex partner: Not on file    Emotionally abused: Not on file    Physically abused: Not on file    Forced sexual activity: Not on file  Other Topics Concern  . Not on file  Social History Narrative  . Not on file     Family History  Problem Relation Age of Onset  . Heart disease Father   . Cancer Brother   . Diabetes Brother   . Diabetes Daughter   . Anesthesia problems Neg Hx   . Hypotension Neg Hx   . Malignant hyperthermia Neg Hx   . Pseudochol deficiency Neg Hx     ROS: [x]  Positive   [ ]  Negative   [ ]  All sytems reviewed and are negative  Cardiovascular: []  chest pain/pressure []  palpitations []  SOB lying flat []  DOE []  pain in legs while walking [x]  pain in legs at rest []  pain in legs at night []  non-healing ulcers []  hx of DVT []  swelling in legs  Pulmonary: []   productive cough []  asthma/wheezing []  home O2  Neurologic: []  weakness in []  arms []  legs []  numbness in []  arms []  legs []  hx of CVA []  mini stroke [] difficulty speaking or slurred speech []  temporary loss of vision in one eye []  dizziness  Hematologic: []  hx of cancer []  bleeding problems []  problems with blood clotting easily  Endocrine:   []  diabetes []  thyroid disease  GI []  vomiting blood []  blood in stool  GU: []  CKD/renal failure []  HD--[]  M/W/F or []  T/T/S []  burning with urination []  blood in urine  Psychiatric: []  anxiety []  depression  Musculoskeletal: []  arthritis []  joint pain  Integumentary: [x]  rashes []  ulcers  Constitutional: [x]  fever []  chills   Physical Examination  Vitals:   10/16/17 2359 10/17/17 0545  BP: (!) 155/87 (!) 136/56  Pulse: (!) 103 83  Resp:    Temp: 98.5 F (36.9 C) 98.9 F (37.2 C)  SpO2: 94% 90%   Body mass index is 19.2 kg/m.  General: No acute distress HENT: WNL, normocephalic Pulmonary: normal non-labored breathing Cardiac: 2+ right common femoral pulse, 1+ left femoral pulse There is a palpable popliteal pulse on the right I cannot palpate one on the left Abdomen: soft, NT/ND, no masses Extremities: He is streaking erythema on the right lower extremity well-healed right fourth toe amputation and a dry gangrenous right great toe Neurologic: A&O X 3; Appropriate Affect ; SENSATION: normal; MOTOR FUNCTION:  moving all extremities equally. Speech is fluent/normal  CBC    Component Value Date/Time   WBC 18.6 (H) 10/16/2017 1842   RBC 3.78 (L) 10/16/2017 1842   HGB 10.7 (L) 10/16/2017 1842   HCT 33.5 (L) 10/16/2017 1842   PLT 519 (H) 10/16/2017 1842   MCV 88.6 10/16/2017 1842   MCH 28.3 10/16/2017 1842   MCHC 31.9 10/16/2017 1842   RDW 14.6 10/16/2017 1842   LYMPHSABS 1.1 10/16/2017 1842   MONOABS 1.2 (H) 10/16/2017 1842   EOSABS 0.1 10/16/2017 1842   BASOSABS 0.1 10/16/2017 1842    BMET      Component Value Date/Time   NA 129 (L) 10/16/2017 1842   K 4.9 10/16/2017 1842   CL 100 10/16/2017 1842   CO2 20 (L) 10/16/2017 1842   GLUCOSE 242 (H) 10/16/2017 1842   BUN 16 10/16/2017 1842   CREATININE 1.11 10/16/2017 1842   CREATININE 0.66 06/07/2011 1645   CALCIUM 9.0 10/16/2017 1842   GFRNONAA >60 10/16/2017 1842   GFRAA >60 10/16/2017 1842    COAGS: Lab Results  Component Value Date   INR 0.92 07/13/2012   INR 1.42 07/01/2011   INR 1.00 06/27/2011     Non-Invasive Vascular Imaging:   I reviewed his previous noninvasive studies which demonstrate patent bypass graft with outflow stenosis and an audible signals without discernible ABIs on the right and ABI on the left is 0.5 and monophasic   ASSESSMENT/PLAN: This is a 77 y.o. male here with dry gangrene right great toe with streaking erythema.  Needs Betadine paint to right great toe.  I have ordered aspirin he will continue his statin.  No need for anticoagulants other than prophylaxis at this time.  We will plan for angiogram on Monday and will need his toe amputated if we can revascularize.  Appears to have a patent bypass graft although high outflow stenosis and does not appear to have any in-line tibials to the level of the foot from recent noninvasive testing.  He does not need any further noninvasive testing at this time.  Marisel Tostenson C. Randie Heinzain, MD [facility] Office: [phone] Pager: [phone]

## 2017-10-17 NOTE — Progress Notes (Addendum)
VASCULAR LAB   Patient had ABI at [facility] 09/29/17.  Report below.  Patient also had duplex of bypass graft 09/29/17.  Please see results in Epic.    LOWER EXTREMITY DOPPLER STUDY  Indications: Gangrene, right great toe  High Risk Factors: Hypertension, Diabetes, past history of smoking, coronary          artery disease.   Vascular Interventions: 07/14/2012: right femoral-popliteal artery bypass graft            with left leg nonreversed great saphenous vein,            amputation right fourth toe.  Performing Technologist: Iran Sizer RVT   Examination Guidelines: A complete evaluation includes at mininum, Doppler waveform signals and systolic blood pressure reading at the level of bilateral brachial, anterior tibial, and posterior tibial arteries, when vessel segments are accessible. Bilateral testing is considered an integral part of a complete examination. Photoelectric Plethysmograph (PPG) waveforms and toe systolic pressure readings are included as required and additional duplex testing as needed. Limited examinations for reoccurring indications may be performed as noted.   ABI Findings: +---------+-----------------+-----+--------+----------------------------------+ Right  Rt Pressure   IndexWaveformComment                    (mmHg)                               +---------+-----------------+-----+--------+----------------------------------+ Brachial 148                                +---------+-----------------+-----+--------+----------------------------------+ PTA                   inaudible              +---------+-----------------+-----+--------+----------------------------------+ DP                    inaudible               +---------+-----------------+-----+--------+----------------------------------+ Great Toe                Unable to obtain due to bandaged                       toe                 +---------+-----------------+-----+--------+----------------------------------+  +---------+------------------+-----+----------+--------------------------------+ Left   Lt Pressure (mmHg)IndexWaveform Comment              +---------+------------------+-----+----------+--------------------------------+ Brachial 147                                 +---------+------------------+-----+----------+--------------------------------+ PTA                    inaudible             +---------+------------------+-----+----------+--------------------------------+ DP    74        0.50 monophasic                 +---------+------------------+-----+----------+--------------------------------+ Great Toe                 unable to obtain due to movement +---------+------------------+-----+----------+--------------------------------+  +-------+-----------+-----------+------------+------------+ ABI/TBIToday's ABIToday's TBIPrevious ABIPrevious TBI +-------+-----------+-----------+------------+------------+ Right inaudible       0.69    0.33     +-------+-----------+-----------+------------+------------+ Left  0.50  0.54    0.31     +-------+-----------+-----------+------------+------------+    Right ABIs appear decreased compared to prior study on 09/09/2012. Left ABIs appear essentially unchanged compared to prior study on 09/09/2012.  Final Interpretation: Right:  No signal obtained in either the right posterior tibial or dorsalis pedis arteries. Left: Resting left  ankle-brachial index indicates moderate left lower extremity arterial disease.    *See table(s) above for measurements and observations.    Electronically signed by Gretta Beganodd Early MD on 10/02/2017 at 4:45:57 PM.     Final     Jaylah Goodlow, RVT 10/17/2017, 7:20 AM

## 2017-10-17 NOTE — Progress Notes (Signed)
PROGRESS NOTE    Daniel MayhewJohn W Tortora  WUJ:[id number] DOB: December 17, 1940 DOA: 10/16/2017 PCP: Dois Davenportichter, Karen L, MD   Brief Narrative: 77 year old with past medical history relevant for peripheral vascular disease status post right femoropopliteal bypass on 07/14/2012, coronary artery disease status post CABG in 07/21/2011, carotid endarterectomy, type 2 diabetes on oral hypoglycemics, hypertension, hyperlipidemia who presented with worsening pain of right gangrenous toe.   Assessment & Plan:   Principal Problem:   Osteomyelitis of great toe of right foot (HCC) Active Problems:   Insulin dependent type 2 diabetes mellitus, controlled (HCC)   Hypertension   Atherosclerosis of native arteries of the extremities with ulceration (HCC)   Gangrene of toe of right foot (HCC)   Cellulitis of great toe of right foot   #) Right toe osteomyelitis: Patient was admitted with elevated white blood cell count and worsening pain.  There is some surrounding cellulitis around the toe as well.  Reportedly patient was pending amputation as an outpatient however is now coming in much more symptomatic. -Continue IV vancomycin and Zosyn started 10/16/2017 -Follow blood cultures ordered 10/16/2017 -Vascular surgery consult, plan for lower extremity arteriogram on 8/26 or 8/27 -Most recent ABIs on 09/29/2017 show worsening on right compared to last study on 09/09/2012  #) Coronary artery disease status post CABG/cerebrovascular disease: - Continue metoprolol tartrate 12.5 mg twice daily -Continue simvastatin 40 mg nightly  #) Hypertension/hyperlipidemia: -Continue statin -Continue fenofibrate 150 mg daily -Continue Toprol tartrate 12.5 mg twice daily  #) Type 2 diabetes: -Hold home glipizide 5 mg twice daily -Hold home metformin thousand milligrams twice daily No sliding scale insulin, AC at bedtime  Fluids: Tolerating p.o. Electrodes: Monitor and supplement Nutrition Carb restricted  Prophylaxis:  Enoxaparin  Disposition: Pending vascular surgery evaluation   Full code   Consultants:   Vascular surgery  Procedures:   None  Antimicrobials:   IV vancomycin and Zosyn started 10/16/2017   Subjective: Patient reports that he is doing fairly well other than pain.  He denies any nausea, vomiting, diarrhea, cough, congestion, rhinorrhea.  Objective: Vitals:   10/16/17 2215 10/16/17 2256 10/16/17 2359 10/17/17 0545  BP: (!) 142/92 135/69 (!) 155/87 (!) 136/56  Pulse: 81 84 (!) 103 83  Resp:  (!) 24    Temp:   98.5 F (36.9 C) 98.9 F (37.2 C)  TempSrc:   Oral Oral  SpO2: 95% 98% 94% 90%  Weight:      Height:        Intake/Output Summary (Last 24 hours) at 10/17/2017 1056 Last data filed at 10/17/2017 0546 Gross per 24 hour  Intake -  Output 360 ml  Net -360 ml   Filed Weights   10/16/17 1814  Weight: 59 kg    Examination:  General exam: Appears calm and comfortable  Respiratory system: Clear to auscultation. Respiratory effort normal. Cardiovascular system: Distant heart sounds, regular rate and rhythm, no murmurs Gastrointestinal system: Abdomen is nondistended, soft and nontender. No organomegaly or masses felt. Normal bowel sounds heard. Central nervous system: Alert and oriented. No focal neurological deficits. Extremities: No lower extremity edema, unable to palpate bilateral lower extremity pulses.  Missing toes on right Skin: Right toe is black, purulent, some proximal spreading redness Psychiatry: Judgement and insight appear normal. Mood & affect appropriate.     Data Reviewed: I have personally reviewed following labs and imaging studies  CBC: Recent Labs  Lab 10/16/17 1842  WBC 18.6*  NEUTROABS 16.1*  HGB 10.7*  HCT 33.5*  MCV  88.6  PLT 519*   Basic Metabolic Panel: Recent Labs  Lab 10/16/17 1842  NA 129*  K 4.9  CL 100  CO2 20*  GLUCOSE 242*  BUN 16  CREATININE 1.11  CALCIUM 9.0   GFR: Estimated Creatinine Clearance:  46.5 mL/min (by C-G formula based on SCr of 1.11 mg/dL). Liver Function Tests: No results for input(s): AST, ALT, ALKPHOS, BILITOT, PROT, ALBUMIN in the last 168 hours. No results for input(s): LIPASE, AMYLASE in the last 168 hours. No results for input(s): AMMONIA in the last 168 hours. Coagulation Profile: No results for input(s): INR, PROTIME in the last 168 hours. Cardiac Enzymes: No results for input(s): CKTOTAL, CKMB, CKMBINDEX, TROPONINI in the last 168 hours. BNP (last 3 results) No results for input(s): PROBNP in the last 8760 hours. HbA1C: Recent Labs    10/16/17 2249  HGBA1C 7.0*   CBG: Recent Labs  Lab 10/17/17 0759  GLUCAP 94   Lipid Profile: No results for input(s): CHOL, HDL, LDLCALC, TRIG, CHOLHDL, LDLDIRECT in the last 72 hours. Thyroid Function Tests: No results for input(s): TSH, T4TOTAL, FREET4, T3FREE, THYROIDAB in the last 72 hours. Anemia Panel: No results for input(s): VITAMINB12, FOLATE, FERRITIN, TIBC, IRON, RETICCTPCT in the last 72 hours. Sepsis Labs: Recent Labs  Lab 10/16/17 1849 10/16/17 2157  LATICACIDVEN 1.96* 2.17*    No results found for this or any previous visit (from the past 240 hour(s)).       Radiology Studies: Dg Foot Complete Right  Result Date: 10/16/2017 CLINICAL DATA:  Right great toe gangrene. EXAM: RIGHT FOOT COMPLETE - 3+ VIEW COMPARISON:  None. FINDINGS: Prior amputation of the fourth toe. Suspected gas in the webspace between the first and second digits. Potential cortical demineralization along the midshaft of the proximal phalanx great toe. No overt bony destructive findings. Abnormal soft tissue swelling medial to the first MTP joint. There is also diffuse subcutaneous edema. No malalignment at the Lisfranc joint. Fast flow calcifications are observed. There is dorsal subcutaneous edema along the forefoot. IMPRESSION: 1. Abnormal gas along the webspace between the first and second toes, suspicious for soft tissue  infection. 2. There is some cortical demineralization in the midshaft of the proximal phalanx great toe which may be a sign of early osteomyelitis. 3. Soft tissue swelling medial to the first MTP joint. 4. Prior amputation of the fourth toe. 5. Dorsal subcutaneous edema in the foot. Electronically Signed   By: Gaylyn Rong [name]   On: 10/16/2017 20:14        Scheduled Meds: . enoxaparin (LOVENOX) injection  40 mg Subcutaneous Q24H  . fenofibrate  160 mg Oral Daily  . insulin aspart  0-15 Units Subcutaneous TID WC  . metoprolol tartrate  12.5 mg Oral BID  . simvastatin  40 mg Oral QHS   Continuous Infusions: . piperacillin-tazobactam (ZOSYN)  IV 3.375 g (10/17/17 0545)  . vancomycin       LOS: 1 day    Time spent: 35    Delaine Lame, MD [facility]  If 7PM-7AM, please contact night-coverage [email or web address] Password [facility] 10/17/2017, 10:56 AM

## 2017-10-17 NOTE — Plan of Care (Signed)

## 2017-10-17 NOTE — Plan of Care (Signed)

## 2017-10-18 DIAGNOSIS — E440 Moderate protein-calorie malnutrition: Secondary | ICD-10-CM

## 2017-10-18 LAB — CBC
HCT: 32 % — ABNORMAL LOW (ref 39.0–52.0)
Hemoglobin: 10.3 g/dL — ABNORMAL LOW (ref 13.0–17.0)
MCH: 28.3 pg (ref 26.0–34.0)
MCHC: 32.2 g/dL (ref 30.0–36.0)
MCV: 87.9 fL (ref 78.0–100.0)
Platelets: 554 K/uL — ABNORMAL HIGH (ref 150–400)
RBC: 3.64 MIL/uL — ABNORMAL LOW (ref 4.22–5.81)
RDW: 14.6 % (ref 11.5–15.5)
WBC: 12.5 10*3/uL — ABNORMAL HIGH (ref 4.0–10.5)

## 2017-10-18 LAB — BASIC METABOLIC PANEL
Anion gap: 8 (ref 5–15)
BUN: 11 mg/dL (ref 8–23)
Chloride: 104 mmol/L (ref 98–111)
GFR calc Af Amer: >60 mL/min (ref >60)
GFR calc non Af Amer: >60 mL/min (ref >60)
Glucose, Bld: 123 mg/dL — ABNORMAL HIGH (ref 70–99)
Potassium: 5.3 mmol/L — ABNORMAL HIGH (ref 3.5–5.1)
Sodium: 132 mmol/L — ABNORMAL LOW (ref 135–145)

## 2017-10-18 LAB — GLUCOSE, CAPILLARY
Glucose-Capillary: 150 mg/dL — ABNORMAL HIGH (ref 70–99)
Glucose-Capillary: 139 mg/dL — ABNORMAL HIGH (ref 70–99)
Glucose-Capillary: 228 mg/dL — ABNORMAL HIGH (ref 70–99)
Glucose-Capillary: 179 mg/dL — ABNORMAL HIGH (ref 70–99)

## 2017-10-18 LAB — BASIC METABOLIC PANEL WITH GFR
CO2: 20 mmol/L — ABNORMAL LOW (ref 22–32)
Calcium: 8.4 mg/dL — ABNORMAL LOW (ref 8.9–10.3)
Creatinine, Ser: 0.99 mg/dL (ref 0.61–1.24)

## 2017-10-18 LAB — MAGNESIUM: Magnesium: 1.9 mg/dL (ref 1.7–2.4)

## 2017-10-18 NOTE — Consult Note (Signed)
WOC Nurse wound consult note Reason for Consult: Dry gangrene of RGT, traumatic full thickness injury in the interdigital space secondary to toenail trauma.  WOC is simultaneously consulted with VVS, plan is for evaluation of flow via arteriogram tomorrow.  If unable to revascularize, Dr. Darcella Cheshireain's note indicates that an amputation of the RGT will be needed. He indicates that betadine painting is appropriate at this time, so I will place that order for Nursing. Wound type:arterial, trauma Pressure Injury POA: NA Measurement:RGT and 0.2cm x 1.4cm x 0.2cm Wound ZOX:WRUEbed:pink, dry Drainage (amount, consistency, odor) none Periwound: dry gangrene to RGT with odor consistent with that condition. Dressing procedure/placement/frequency: Betadine swabstick painting to RGT twice daily. NS cleanse and dry gauze dressing placed between toes to reduce trauma from toenail.  Since Vascular Surgery (Dr. Randie Heinzain) is caring for patient and his orders supercede that of a WOC Nurse, we will not follow.  WOC nursing team will not follow, but will remain available to this patient, the nursing and medical teams.  Please re-consult if needed. Thanks, Ladona MowLaurie Joyceann Kruser, MSN, RN, GNP, Hans EdenCWOCN, CWON-AP, FAAN  Pager# [phone]

## 2017-10-18 NOTE — Progress Notes (Signed)
PROGRESS NOTE    Daniel Tyler  AVW:[id number] DOB: [birth year] DOA: 10/16/2017 PCP: Dois Davenport, MD   Brief Narrative: 77 year old with past medical history relevant for peripheral vascular disease status post right femoropopliteal bypass on 07/14/2012, coronary artery disease status post CABG in 07/21/2011, carotid endarterectomy, type 2 diabetes on oral hypoglycemics, hypertension, hyperlipidemia who presented with worsening pain of right gangrenous toe.   Assessment & Plan:   Principal Problem:   Osteomyelitis of great toe of right foot (HCC) Active Problems:   Insulin dependent type 2 diabetes mellitus, controlled (HCC)   Hypertension   Atherosclerosis of native arteries of the extremities with ulceration (HCC)   Gangrene of toe of right foot (HCC)   Cellulitis of great toe of right foot   Malnutrition of moderate degree   #) Right toe osteomyelitis: Patient was admitted with elevated white blood cell count and worsening pain.  There is some surrounding cellulitis around the toe as well.  Reportedly patient was pending amputation as an outpatient however is now coming in much more symptomatic. -Continue IV vancomycin and Zosyn started 10/16/2017 - blood cultures ordered 10/16/2017, no growth to date -Vascular surgery consult, n.p.o. at midnight for possible procedure tomorrow  #) Coronary artery disease status post CABG/cerebrovascular disease: - Continue metoprolol tartrate 12.5 mg twice daily -Continue simvastatin 40 mg nightly  #) Hypertension/hyperlipidemia: -Continue statin -Continue fenofibrate 150 mg daily -Continue Toprol tartrate 12.5 mg twice daily  #) Type 2 diabetes: -Hold home glipizide 5 mg twice daily -Hold home metformin thousand milligrams twice daily No sliding scale insulin, AC at bedtime  Fluids: Tolerating p.o. Electrodes: Monitor and supplement Nutrition Carb restricted  Prophylaxis: Enoxaparin  Disposition: Pending vascular surgery  evaluation   Full code   Consultants:   Vascular surgery  Procedures:   None  Antimicrobials:   IV vancomycin and Zosyn started 10/16/2017   Subjective: Patient reports that he is doing fairly well but is eager to get out of the hospital.  He denies any nausea, vomiting, diarrhea, cough, congestion, rhinorrhea.  Objective: Vitals:   10/17/17 0545 10/17/17 1524 10/17/17 2017 10/18/17 0414  BP: (!) 136/56 121/61 (!) 147/79 117/81  Pulse: 83 72 86 83  Resp:  16 14 14   Temp: 98.9 F (37.2 C) 97.7 F (36.5 C) 98 F (36.7 C)   TempSrc: Oral Oral Oral   SpO2: 90% 100% (!) 85% 100%  Weight:      Height:        Intake/Output Summary (Last 24 hours) at 10/18/2017 1143 Last data filed at 10/18/2017 1100 Gross per 24 hour  Intake 856.77 ml  Output 2450 ml  Net -1593.23 ml   Filed Weights   10/16/17 1814  Weight: 59 kg    Examination:  General exam: Appears calm and comfortable  Respiratory system: Clear to auscultation. Respiratory effort normal. Cardiovascular system: Distant heart sounds, regular rate and rhythm, no murmurs Gastrointestinal system: Abdomen is nondistended, soft and nontender. No organomegaly or masses felt. Normal bowel sounds heard. Central nervous system: Alert and oriented. No focal neurological deficits. Extremities: No lower extremity edema, unable to palpate bilateral lower extremity pulses.  Missing toes on right Skin: Right toe is black, purulent, diminished proximal spreading redness Psychiatry: Judgement and insight appear normal. Mood & affect appropriate.     Data Reviewed: I have personally reviewed following labs and imaging studies  CBC: Recent Labs  Lab 10/16/17 1842 10/18/17 0520  WBC 18.6* 12.5*  NEUTROABS 16.1*  --  HGB 10.7* 10.3*  HCT 33.5* 32.0*  MCV 88.6 87.9  PLT 519* 554*   Basic Metabolic Panel: Recent Labs  Lab 10/16/17 1842 10/18/17 0520  NA 129* 132*  K 4.9 5.3*  CL 100 104  CO2 20* 20*  GLUCOSE  242* 123*  BUN 16 11  CREATININE 1.11 0.99  CALCIUM 9.0 8.4*  MG  --  1.9   GFR: Estimated Creatinine Clearance: 52.1 mL/min (by C-G formula based on SCr of 0.99 mg/dL). Liver Function Tests: No results for input(s): AST, ALT, ALKPHOS, BILITOT, PROT, ALBUMIN in the last 168 hours. No results for input(s): LIPASE, AMYLASE in the last 168 hours. No results for input(s): AMMONIA in the last 168 hours. Coagulation Profile: No results for input(s): INR, PROTIME in the last 168 hours. Cardiac Enzymes: No results for input(s): CKTOTAL, CKMB, CKMBINDEX, TROPONINI in the last 168 hours. BNP (last 3 results) No results for input(s): PROBNP in the last 8760 hours. HbA1C: Recent Labs    10/16/17 2249  HGBA1C 7.0*   CBG: Recent Labs  Lab 10/17/17 0759 10/17/17 1232 10/17/17 1633 10/17/17 2212 10/18/17 0744  GLUCAP 94 141* 139* 150* 150*   Lipid Profile: No results for input(s): CHOL, HDL, LDLCALC, TRIG, CHOLHDL, LDLDIRECT in the last 72 hours. Thyroid Function Tests: No results for input(s): TSH, T4TOTAL, FREET4, T3FREE, THYROIDAB in the last 72 hours. Anemia Panel: No results for input(s): VITAMINB12, FOLATE, FERRITIN, TIBC, IRON, RETICCTPCT in the last 72 hours. Sepsis Labs: Recent Labs  Lab 10/16/17 1849 10/16/17 2157  LATICACIDVEN 1.96* 2.17*    Recent Results (from the past 240 hour(s))  Blood culture (routine x 2)     Status: None (Preliminary result)   Collection Time: 10/16/17  9:50 PM  Result Value Ref Range Status   Specimen Description BLOOD RIGHT HAND  Final   Special Requests   Final    BOTTLES DRAWN AEROBIC AND ANAEROBIC Blood Culture adequate volume   Culture   Final    NO GROWTH < 12 HOURS Performed at [facility], [address] [place], [place] [address]    Report Status PENDING  Incomplete  Blood culture (routine x 2)     Status: None (Preliminary result)   Collection Time: 10/16/17  9:55 PM  Result Value Ref Range Status   Specimen  Description BLOOD LEFT ARM  Final   Special Requests   Final    BOTTLES DRAWN AEROBIC AND ANAEROBIC Blood Culture adequate volume   Culture   Final    NO GROWTH < 12 HOURS Performed at [facility], [address] [place], [place] [address]    Report Status PENDING  Incomplete         Radiology Studies: Dg Foot Complete Right  Result Date: 10/16/2017 CLINICAL DATA:  Right great toe gangrene. EXAM: RIGHT FOOT COMPLETE - 3+ VIEW COMPARISON:  None. FINDINGS: Prior amputation of the fourth toe. Suspected gas in the webspace between the first and second digits. Potential cortical demineralization along the midshaft of the proximal phalanx great toe. No overt bony destructive findings. Abnormal soft tissue swelling medial to the first MTP joint. There is also diffuse subcutaneous edema. No malalignment at the Lisfranc joint. Fast flow calcifications are observed. There is dorsal subcutaneous edema along the forefoot. IMPRESSION: 1. Abnormal gas along the webspace between the first and second toes, suspicious for soft tissue infection. 2. There is some cortical demineralization in the midshaft of the proximal phalanx great toe which may  be a sign of early osteomyelitis. 3. Soft tissue swelling medial to the first MTP joint. 4. Prior amputation of the fourth toe. 5. Dorsal subcutaneous edema in the foot. Electronically Signed   By: Gaylyn Rong [name]   On: 10/16/2017 20:14        Scheduled Meds: . aspirin EC  81 mg Oral Daily  . enoxaparin (LOVENOX) injection  40 mg Subcutaneous Q24H  . feeding supplement (ENSURE ENLIVE)  237 mL Oral BID BM  . fenofibrate  160 mg Oral Daily  . insulin aspart  0-15 Units Subcutaneous TID WC  . metoprolol tartrate  12.5 mg Oral BID  . multivitamin with minerals  1 tablet Oral Daily  . simvastatin  40 mg Oral QHS   Continuous Infusions: . piperacillin-tazobactam (ZOSYN)  IV 3.375 g (10/18/17 0538)  . vancomycin 1,000 mg (10/17/17 2301)      LOS: 2 days    Time spent: 35    Delaine Lame, MD [facility]  If 7PM-7AM, please contact night-coverage [email or web address] Password [facility] 10/18/2017, 11:43 AM

## 2017-10-18 NOTE — Progress Notes (Signed)
  Progress Note    10/18/2017 11:12 AM * No surgery found *  Subjective: No acute changes  Vitals:   10/17/17 2017 10/18/17 0414  BP: (!) 147/79 117/81  Pulse: 86 83  Resp: 14 14  Temp: 98 F (36.7 C)   SpO2: (!) 85% 100%    Physical Exam: Awake alert oriented Bilateral common femoral pulses are palpable Palpable right popliteal pulse Stable gangrenous changes to right great toe  CBC    Component Value Date/Time   WBC 12.5 (H) 10/18/2017 0520   RBC 3.64 (L) 10/18/2017 0520   HGB 10.3 (L) 10/18/2017 0520   HCT 32.0 (L) 10/18/2017 0520   PLT 554 (H) 10/18/2017 0520   MCV 87.9 10/18/2017 0520   MCH 28.3 10/18/2017 0520   MCHC 32.2 10/18/2017 0520   RDW 14.6 10/18/2017 0520   LYMPHSABS 1.1 10/16/2017 1842   MONOABS 1.2 (H) 10/16/2017 1842   EOSABS 0.1 10/16/2017 1842   BASOSABS 0.1 10/16/2017 1842    BMET    Component Value Date/Time   NA 132 (L) 10/18/2017 0520   K 5.3 (H) 10/18/2017 0520   CL 104 10/18/2017 0520   CO2 20 (L) 10/18/2017 0520   GLUCOSE 123 (H) 10/18/2017 0520   BUN 11 10/18/2017 0520   CREATININE 0.99 10/18/2017 0520   CREATININE 0.66 06/07/2011 1645   CALCIUM 8.4 (L) 10/18/2017 0520   GFRNONAA >60 10/18/2017 0520   GFRAA >60 10/18/2017 0520    INR    Component Value Date/Time   INR 0.92 07/13/2012 1440     Intake/Output Summary (Last 24 hours) at 10/18/2017 1112 Last data filed at 10/18/2017 [id number] Gross per 24 hour  Intake 856.77 ml  Output 1750 ml  Net -893.23 ml     Assessment/plan:  77 y.o. male is here with gangrenous changes to his right great toe.  He has a previous femoral-popliteal artery bypass on that side and a palpable pulse although high grade outflow stenosis on recent duplex.  Does not have discernible signals distally.  We will plan angiogram possible intervention on the right lower extremity tomorrow.  N.p.o. past midnight.   Malorie Bigford C. Randie Heinzain, MD [facility] [place] Office:  [phone] Pager: [phone]  10/18/2017 11:12 AM

## 2017-10-18 NOTE — Progress Notes (Addendum)
Patient refuses prevalon boot at this time. Explained to the pt the importance of wearing the boot but pt continues to refuse. Wound care done for the shift. Will continue to monitor

## 2017-10-18 NOTE — Plan of Care (Signed)

## 2017-10-19 ENCOUNTER — Encounter (HOSPITAL_COMMUNITY)
Admission: EM | Disposition: A | Discharge status: Skilled Nursing Facility | Payer: Self-pay | Source: Home / Self Care | Attending: Internal Medicine

## 2017-10-19 ENCOUNTER — Encounter (HOSPITAL_COMMUNITY): Payer: Self-pay | Admitting: Vascular Surgery

## 2017-10-19 DIAGNOSIS — I96 Gangrene, not elsewhere classified: Secondary | ICD-10-CM

## 2017-10-19 HISTORY — PX: ABDOMINAL AORTOGRAM W/LOWER EXTREMITY: CATH118223

## 2017-10-19 HISTORY — PX: PERIPHERAL VASCULAR BALLOON ANGIOPLASTY: CATH118281

## 2017-10-19 HISTORY — PX: PERIPHERAL VASCULAR INTERVENTION: CATH118257

## 2017-10-19 LAB — BASIC METABOLIC PANEL WITH GFR
CO2: 19 mmol/L — ABNORMAL LOW (ref 22–32)
Chloride: 101 mmol/L (ref 98–111)
Creatinine, Ser: 0.91 mg/dL (ref 0.61–1.24)
Glucose, Bld: 167 mg/dL — ABNORMAL HIGH (ref 70–99)
Potassium: 4.2 mmol/L (ref 3.5–5.1)

## 2017-10-19 LAB — BASIC METABOLIC PANEL
Anion gap: 10 (ref 5–15)
BUN: 7 mg/dL — ABNORMAL LOW (ref 8–23)
Calcium: 8.5 mg/dL — ABNORMAL LOW (ref 8.9–10.3)
GFR calc Af Amer: >60 mL/min (ref >60)
GFR calc non Af Amer: >60 mL/min (ref >60)
Sodium: 130 mmol/L — ABNORMAL LOW (ref 135–145)

## 2017-10-19 LAB — GLUCOSE, CAPILLARY
GLUCOSE-CAPILLARY: 176 mg/dL — AB (ref 70–99)
Glucose-Capillary: 151 mg/dL — ABNORMAL HIGH (ref 70–99)
Glucose-Capillary: 149 mg/dL — ABNORMAL HIGH (ref 70–99)
Glucose-Capillary: 244 mg/dL — ABNORMAL HIGH (ref 70–99)

## 2017-10-19 LAB — POCT ACTIVATED CLOTTING TIME
ACTIVATED CLOTTING TIME: 252 s
ACTIVATED CLOTTING TIME: 197 s

## 2017-10-19 SURGERY — ABDOMINAL AORTOGRAM W/LOWER EXTREMITY
Anesthesia: LOCAL | Laterality: Right

## 2017-10-19 MED ORDER — HYDRALAZINE HCL 20 MG/ML IJ SOLN: 5.0000 mg | INTRAMUSCULAR | Status: DC | PRN

## 2017-10-19 MED ORDER — ACETAMINOPHEN 325 MG PO TABS
650.0000 mg | ORAL_TABLET | ORAL | Status: DC | PRN
  Administered 2017-10-20: 650 mg via ORAL
  Filled 2017-10-19: qty 2

## 2017-10-19 MED ORDER — LIDOCAINE HCL (PF) 1 % IJ SOLN
INTRAMUSCULAR | Status: AC
  Filled 2017-10-19: qty 30

## 2017-10-19 MED ORDER — HEPARIN SODIUM (PORCINE) 1000 UNIT/ML IJ SOLN
INTRAMUSCULAR | Status: DC | PRN
  Administered 2017-10-19: 6000 [IU] via INTRAVENOUS

## 2017-10-19 MED ORDER — MIDAZOLAM HCL 2 MG/2ML IJ SOLN
INTRAMUSCULAR | Status: AC
  Filled 2017-10-19: qty 2

## 2017-10-19 MED ORDER — HEPARIN (PORCINE) IN NACL 1000-0.9 UT/500ML-% IV SOLN
INTRAVENOUS | Status: DC | PRN
  Administered 2017-10-19 (×2): 500 mL

## 2017-10-19 MED ORDER — SODIUM CHLORIDE 0.9% FLUSH: 3.0000 mL | INTRAVENOUS | Status: DC | PRN

## 2017-10-19 MED ORDER — LABETALOL HCL 5 MG/ML IV SOLN: 10.0000 mg | INTRAVENOUS | Status: DC | PRN

## 2017-10-19 MED ORDER — FENTANYL CITRATE (PF) 100 MCG/2ML IJ SOLN
INTRAMUSCULAR | Status: DC | PRN
  Administered 2017-10-19 (×2): 25 ug via INTRAVENOUS

## 2017-10-19 MED ORDER — SODIUM CHLORIDE 0.9 % IV SOLN
INTRAVENOUS | Status: DC
  Administered 2017-10-19 (×2): via INTRAVENOUS
  Administered 2017-10-21: 100 mL/h via INTRAVENOUS
  Administered 2017-10-22 (×2): via INTRAVENOUS

## 2017-10-19 MED ORDER — NITROGLYCERIN 1 MG/10 ML FOR IR/CATH LAB
INTRA_ARTERIAL | Status: DC | PRN
  Administered 2017-10-19 (×2): 200 ug via INTRA_ARTERIAL

## 2017-10-19 MED ORDER — IODIXANOL 320 MG/ML IV SOLN
INTRAVENOUS | Status: DC | PRN
  Administered 2017-10-19: 160 mL via INTRA_ARTERIAL

## 2017-10-19 MED ORDER — FENTANYL CITRATE (PF) 100 MCG/2ML IJ SOLN
INTRAMUSCULAR | Status: AC
  Filled 2017-10-19: qty 2

## 2017-10-19 MED ORDER — NITROGLYCERIN 1 MG/10 ML FOR IR/CATH LAB
INTRA_ARTERIAL | Status: AC
  Filled 2017-10-19: qty 10

## 2017-10-19 MED ORDER — CLOPIDOGREL BISULFATE 75 MG PO TABS
75.0000 mg | ORAL_TABLET | Freq: Every day | ORAL | Status: DC
  Administered 2017-10-20 – 2017-10-25 (×6): 75 mg via ORAL
  Filled 2017-10-19 (×6): qty 1

## 2017-10-19 MED ORDER — MIDAZOLAM HCL 2 MG/2ML IJ SOLN
INTRAMUSCULAR | Status: DC | PRN
  Administered 2017-10-19: 0.5 mg via INTRAVENOUS

## 2017-10-19 MED ORDER — HEPARIN (PORCINE) IN NACL 1000-0.9 UT/500ML-% IV SOLN
INTRAVENOUS | Status: AC
  Filled 2017-10-19: qty 1000

## 2017-10-19 MED ORDER — SODIUM CHLORIDE 0.9 % WEIGHT BASED INFUSION
1.0000 mL/kg/h | INTRAVENOUS | Status: AC
  Administered 2017-10-19: 1 mL/kg/h via INTRAVENOUS

## 2017-10-19 MED ORDER — SODIUM CHLORIDE 0.9 % IV SOLN
250.0000 mL | INTRAVENOUS | Status: DC | PRN
  Administered 2017-10-23: 22:00:00 via INTRAVENOUS

## 2017-10-19 MED ORDER — CLOPIDOGREL BISULFATE 300 MG PO TABS
ORAL_TABLET | ORAL | Status: DC | PRN
  Administered 2017-10-19: 300 mg via ORAL

## 2017-10-19 MED ORDER — SODIUM CHLORIDE 0.9% FLUSH
3.0000 mL | Freq: Two times a day (BID) | INTRAVENOUS | Status: DC
  Administered 2017-10-23 – 2017-10-25 (×3): 3 mL via INTRAVENOUS

## 2017-10-19 MED ORDER — ONDANSETRON HCL 4 MG/2ML IJ SOLN: 4.0000 mg | Freq: Four times a day (QID) | INTRAMUSCULAR | Status: DC | PRN

## 2017-10-19 MED ORDER — HEPARIN SODIUM (PORCINE) 1000 UNIT/ML IJ SOLN
INTRAMUSCULAR | Status: AC
  Filled 2017-10-19: qty 1

## 2017-10-19 MED ORDER — LIDOCAINE HCL (PF) 1 % IJ SOLN
INTRAMUSCULAR | Status: DC | PRN
  Administered 2017-10-19: 15 mL

## 2017-10-19 SURGICAL SUPPLY — 25 items
BAG SNAP BAND KOVER 36X36 (MISCELLANEOUS) ×1 IMPLANT
BALL STERLING OTW 2.5X100X150 (BALLOONS) ×1
BALLN STERLING OTW 2.5X100X150 (BALLOONS) ×2
BALLN STERLING OTW 3X20X150 (BALLOONS) ×3
BALLOON STERLING OTW 3X20X150 (BALLOONS) IMPLANT
BALLOON STRLNG OTW 2.5X100X150 (BALLOONS) IMPLANT
CATH OMNI FLUSH 5F 65CM (CATHETERS) ×1 IMPLANT
CATH QUICKCROSS .035X135CM (MICROCATHETER) ×1 IMPLANT
COVER DOME SNAP 22 D (MISCELLANEOUS) ×1 IMPLANT
DEVICE CLOSURE MYNXGRIP 6/7F (Vascular Products) ×1 IMPLANT
KIT ENCORE 26 ADVANTAGE (KITS) ×1 IMPLANT
KIT MICROPUNCTURE NIT STIFF (SHEATH) ×1 IMPLANT
KIT PV (KITS) ×3 IMPLANT
SHEATH FLEX ANSEL ANG 6F 45CM (SHEATH) ×1 IMPLANT
SHEATH FLEXOR ANSEL 1 7F 45CM (SHEATH) ×1 IMPLANT
SHEATH PINNACLE 5F 10CM (SHEATH) ×1 IMPLANT
SHEATH PINNACLE 7F 10CM (SHEATH) ×1 IMPLANT
SHEATH PROBE COVER 6X72 (BAG) ×1 IMPLANT
STENT VIABAHN VBX 7X19X80 (Permanent Stent) ×1 IMPLANT
SYR MEDRAD MARK V 150ML (SYRINGE) ×3 IMPLANT
TRANSDUCER W/STOPCOCK (MISCELLANEOUS) ×3 IMPLANT
TRAY PV CATH (CUSTOM PROCEDURE TRAY) ×3 IMPLANT
WIRE BENTSON .035X145CM (WIRE) ×1 IMPLANT
WIRE G V18X300CM (WIRE) ×1 IMPLANT
WIRE ROSEN-J .035X260CM (WIRE) ×1 IMPLANT

## 2017-10-19 NOTE — Progress Notes (Signed)
  Progress Note    10/19/2017 12:47 PM * No surgery found *  Subjective: No acute changes.  Vitals:   10/18/17 2117 10/19/17 0449  BP: (!) 119/50 127/61  Pulse: 68 79  Resp:    Temp: 98.7 F (37.1 C) 98.4 F (36.9 C)  SpO2: 100% 96%    Physical Exam: Awake alert oriented Bilateral common femoral pulses are palpable Palpable right popliteal pulse Stable gangrenous changes to right great toe  CBC    Component Value Date/Time   WBC 12.5 (H) 10/18/2017 0520   RBC 3.64 (L) 10/18/2017 0520   HGB 10.3 (L) 10/18/2017 0520   HCT 32.0 (L) 10/18/2017 0520   PLT 554 (H) 10/18/2017 0520   MCV 87.9 10/18/2017 0520   MCH 28.3 10/18/2017 0520   MCHC 32.2 10/18/2017 0520   RDW 14.6 10/18/2017 0520   LYMPHSABS 1.1 10/16/2017 1842   MONOABS 1.2 (H) 10/16/2017 1842   EOSABS 0.1 10/16/2017 1842   BASOSABS 0.1 10/16/2017 1842    BMET    Component Value Date/Time   NA 130 (L) 10/19/2017 0324   K 4.2 10/19/2017 0324   CL 101 10/19/2017 0324   CO2 19 (L) 10/19/2017 0324   GLUCOSE 167 (H) 10/19/2017 0324   BUN 7 (L) 10/19/2017 0324   CREATININE 0.91 10/19/2017 0324   CREATININE 0.66 06/07/2011 1645   CALCIUM 8.5 (L) 10/19/2017 0324   GFRNONAA >60 10/19/2017 0324   GFRAA >60 10/19/2017 0324    INR    Component Value Date/Time   INR 0.92 07/13/2012 1440     Intake/Output Summary (Last 24 hours) at 10/19/2017 1247 Last data filed at 10/19/2017 0600 Gross per 24 hour  Intake 845.97 ml  Output 400 ml  Net 445.97 ml     Assessment/plan:  77 y.o. male is here with gangrenous changes to his right great toe.  He has a previous femoral-popliteal artery bypass on that side and a palpable pulse although high grade outflow stenosis on recent duplex.  Arteriogram RLE with possible intervention.   Cephus Shellinghristopher J. Clark, MD [facility] [place] Office: [phone] Pager: [phone]  Cephus Shellinghristopher J Clark

## 2017-10-19 NOTE — Progress Notes (Signed)
Pharmacy Antibiotic Note  Daniel Tyler is a 77 y.o. male admitted on 10/16/2017 with wound infection.  Pharmacy has been consulted for Vancomycin and zosyn dosing. Patient was planned to have an amputation of his great right toe but presented to ED early due to worsening pain. WBC 18.6. LA 1.96>2.17, SCr stable (0.91). CrCl ~ 55 mL/min  Plan: -Vancomycin 1000 mg IV Q 24 hours  -Zosyn 3.375 gm IV Q 8 hours -Monitor CBC, renal fx, cultures and clinical progress -Check vancomycin trough soon.  Height: 5\' 9"  (175.3 cm) Weight: 130 lb (59 kg) IBW/kg (Calculated) : 70.7  Temp (24hrs), Avg:98.3 F (36.8 C), Min:97.8 F (36.6 C), Max:98.7 F (37.1 C)  Recent Labs  Lab 10/16/17 1842 10/16/17 1849 10/16/17 2157 10/18/17 0520 10/19/17 0324  WBC 18.6*  --   --  12.5*  --   CREATININE 1.11  --   --  0.99 0.91  LATICACIDVEN  --  1.96* 2.17*  --   --     Estimated Creatinine Clearance: 56.7 mL/min (by C-G formula based on SCr of 0.91 mg/dL).    No Known Allergies  Antimicrobials this admission: Vanc 8/23 >>  Zosyn 8/23 >>   Dose adjustments this admission: None   Microbiology results: 8/23 BCx:   Thank you for allowing pharmacy to be a part of this patient's care.  Jenetta DownerJessica Hena Ewalt, Pharm D, BCPS, [facility] Clinical Pharmacist Phone [phone]  10/19/2017 4:22 PM

## 2017-10-19 NOTE — Op Note (Signed)
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  Ultrasound-guided access of the left common femoral artery 2.  Aortogram with bilateral lower extremity run-off 3.  Right lower extremity arteriogram with selection of tertiary branches 4.  Right below-knee popliteal anastomosis and native peroneal artery angioplasty (2.5 mm x 100 mm Sterling and 3 mm x 20 mm Sterling) 5.  Right common iliac artery stent (7 mm x 19 mm VBX) 6.  Mynx closure of the left common femoral artery 7.  116 minutes of moderate sedation   Indications: Patient is a 77 year old male with significant medical comorbidities who was admitted over the weekend with a new gangrenous right great toe.  He previously underwent a right femoral to below-knee popliteal bypass with non-reversed saphenous vein from the contralateral leg.  A graft duplex revealed critical distal anastomotic stenosis given velocities of nearly 500 cm/s.  We recommended a right lower extremity arteriogram today with possible intervention to try and improve his inflow.  Patient will also need a right great toe amputation as well.  He presents after risks and benefits were discussed.  Findings:  1.  Patient had a moderately diseased aortoiliac segment.  He had a greater than 50% right common iliac stenosis that had a pressure gradient of greater than 50 mmHg.  His right hypogastric artery appeared chronically occluded.  The left hypogastric artery was patent but diseased. 2.  Patient had a long segment right SFA occlusion and a widely patent common femoral to below-knee popliteal bypass that only measured about 3 mm in diameter. 3.   The below-knee popliteal anastomosis had a critical high-grade stenosis that was greater than 90%.  There was single vessel runoff through the peroneal with filling of some collaterals at the ankle.  There was no reconstitution of a distal posterior tibial or dorsalis pedal target. 4.  His left lower extremity run-off demonstrated a long SFA occlusion as well.  He also had single vessel runoff via the peroneal in the left lower extremity with a dorsalis pedis that reconstituted via a collateral off the peroneal near the ankle.   Procedure:  The patient was identified in the holding area and taken to [facility] cath lab.  The patient was then placed supine on the table and prepped and draped in the usual sterile fashion.  A time out was called.  Ultrasound was used to evaluate the left common femoral artery.  It was patent.  A digital ultrasound image was acquired.  A micropuncture needle was used to access the left common femoral artery under ultrasound guidance.  An 018 wire was advanced without resistance and a micropuncture sheath was placed.  The 018 wire was removed and a benson wire was placed.  The micropuncture sheath was exchanged for a 5 french sheath.  An omniflush catheter was advanced over the wire to the level of L-1.  An abdominal angiogram was obtained and bilateral lower extremity arteriograms were also obtained.  Next, using the omniflush catheter and a benson wire, the aortic bifurcation was crossed and the catheter was placed into theright external iliac artery.  We were ultimately able to get our benson wire in the right lower extremity bypass.  We exchanged for a  Rosen wire into the native bypass.  At that point time we upsized to a long 6 [place] Ansell sheath in the left groin.  The patient was then given 6000 units of IV heparin.  ACTs were monitored throughout the case.  We then exchanged for a V 18 wire and a quick cross catheter and were ultimately able to cross his bypass into the native  below-knee popliteal artery and into his peroneal artery and past the high grade stenosis in the peroneal.  We did obtain a brief hand injected shot to confirm we were in the true lumen of the peroneal artery distally.  At this point in time with the wire back down we selected a 2.5 mm x 100 mm Sterling to treat the proximal peroneal where there were several >80% stenotic segments as well as the below-knee popliteal bypass anastomosis.  A repeat arteriogram showed significant improvement but there was still some remnant stenosis in the below-knee popliteal anastomosis.  We subsequently exchanged for a 3 mm x 20 mm Sterling and had significant waist in the balloon that was appropriately treated after inflating to nominal pressure.  Final runoff through the sheath showed significant improvement of less than 30% residual stenosis in the distal anastomosis to the below-knee popliteal artery and the peroneal also had no remnant stenosis.  We then turned our attention to the right common iliac artery that had a greater than 50% stenosis that we noted during the initial aortogram.  We did obtain pullback pressures here with our sheath and the pressure differential was greater than 50 mmHg difference between the aorta and distal external iliac artery beyond the lesion.  We placed a Rosen wire back down our sheath and then planned for a VBX.  In order to deploy VBX we did have to upsized to a 7 [place] long Ansell sheath in the left groin.  Ultimately deployed a 7 mm x 19 mm VBX stent in left common iliac.  A final completion arteriogram by injecting the sheath showed patent runoff through our stent down to the bypass with no remnant stenosis.  At this point in time we were satisfied with the results that we had given the patient the best chance that he was going to heal a toe amputation.  Our sheath was exchanged for a short 7 French sheath in the left groin.  ACT was checked it was 190.  We then deployed a Mynx closure  device in the left groin.     Cephus Shellinghristopher J. Zackaria Burkey, MD [facility] [place] Office: [phone] Pager: [phone]  Cephus Shellinghristopher J Harlie Ragle

## 2017-10-19 NOTE — Care Management Important Message (Signed)
Important Message  Patient Details  Name: Daniel Tyler MRN: [id number] Date of Birth: 1940-11-05   Medicare Important Message Given:  Yes    Jerrett Baldinger Stefan ChurchBratton 10/19/2017, 4:16 PM

## 2017-10-19 NOTE — Progress Notes (Signed)
PROGRESS NOTE    Daniel Tyler  QIO:[id number] DOB: 07-24-1940 DOA: 10/16/2017 PCP: Dois Davenport, MD   Brief Narrative: 77 year old with past medical history relevant for peripheral vascular disease status post right femoropopliteal bypass on 07/14/2012, coronary artery disease status post CABG in 07/21/2011, carotid endarterectomy, type 2 diabetes on oral hypoglycemics, hypertension, hyperlipidemia who presented with worsening pain of right gangrenous toe.   Assessment & Plan:   Principal Problem:   Osteomyelitis of great toe of right foot (HCC) Active Problems:   Insulin dependent type 2 diabetes mellitus, controlled (HCC)   Hypertension   Atherosclerosis of native arteries of the extremities with ulceration (HCC)   Gangrene of toe of right foot (HCC)   Cellulitis of great toe of right foot   Malnutrition of moderate degree   #) Right toe osteomyelitis in the setting of peripheral vascular disease status post femoropopliteal bypass in 2014: Noted to have high-grade stenosis on recent duplex. -Continue IV vancomycin and Zosyn started 10/16/2017 - blood cultures ordered 10/16/2017, no growth to date -Vascular surgery consult appreciate recommendations, plan for the OR today for arteriogram  #) Coronary artery disease status post CABG/cerebrovascular disease: - Continue metoprolol tartrate 12.5 mg twice daily -Continue simvastatin 40 mg nightly  #) Hypertension/hyperlipidemia: -Continue statin -Continue fenofibrate 150 mg daily -Continue Toprol tartrate 12.5 mg twice daily  #) Type 2 diabetes: -Hold home glipizide 5 mg twice daily -Hold home metformin thousand milligrams twice daily -Sliding scale insulin, AC at bedtime  Fluids: Tolerating p.o. Electrodes: Monitor and supplement Nutrition Carb restricted  Prophylaxis: Enoxaparin  Disposition: Pending vascular surgery evaluation   Full code   Consultants:   Vascular surgery  Procedures:    None  Antimicrobials:   IV vancomycin and Zosyn started 10/16/2017   Subjective: Patient is quite frustrated this morning with his condom catheter.  He is eager to find out when his surgery is.  He denies any nausea, vomiting, diarrhea, cough, congestion, rhinorrhea.  Objective: Vitals:   10/18/17 1445 10/18/17 2032 10/18/17 2117 10/19/17 0449  BP: (!) 93/56 133/66 (!) 119/50 127/61  Pulse: 81 96 68 79  Resp: 14 14    Temp: 97.8 F (36.6 C) 97.8 F (36.6 C) 98.7 F (37.1 C) 98.4 F (36.9 C)  TempSrc: Oral Oral  Oral  SpO2: 100% 98% 100% 96%  Weight:      Height:        Intake/Output Summary (Last 24 hours) at 10/19/2017 1035 Last data filed at 10/19/2017 0600 Gross per 24 hour  Intake 845.97 ml  Output 1100 ml  Net -254.03 ml   Filed Weights   10/16/17 1814  Weight: 59 kg    Examination:  General exam: Appears calm and comfortable  Respiratory system: Clear to auscultation. Respiratory effort normal. Cardiovascular system: Distant heart sounds, regular rate and rhythm, no murmurs Gastrointestinal system: Abdomen is nondistended, soft and nontender. No organomegaly or masses felt. Normal bowel sounds heard. Central nervous system: Alert and oriented. No focal neurological deficits. Extremities: No lower extremity edema, unable to palpate bilateral lower extremity pulses.  Missing toes on right Skin: Right toe is black, purulent, diminished proximal spreading redness Psychiatry: Judgement and insight appear normal. Mood & affect appropriate.     Data Reviewed: I have personally reviewed following labs and imaging studies  CBC: Recent Labs  Lab 10/16/17 1842 10/18/17 0520  WBC 18.6* 12.5*  NEUTROABS 16.1*  --   HGB 10.7* 10.3*  HCT 33.5* 32.0*  MCV 88.6 87.9  PLT 519* 554*   Basic Metabolic Panel: Recent Labs  Lab 10/16/17 1842 10/18/17 0520 10/19/17 0324  NA 129* 132* 130*  K 4.9 5.3* 4.2  CL 100 104 101  CO2 20* 20* 19*  GLUCOSE 242* 123*  167*  BUN 16 11 7*  CREATININE 1.11 0.99 0.91  CALCIUM 9.0 8.4* 8.5*  MG  --  1.9  --    GFR: Estimated Creatinine Clearance: 56.7 mL/min (by C-G formula based on SCr of 0.91 mg/dL). Liver Function Tests: No results for input(s): AST, ALT, ALKPHOS, BILITOT, PROT, ALBUMIN in the last 168 hours. No results for input(s): LIPASE, AMYLASE in the last 168 hours. No results for input(s): AMMONIA in the last 168 hours. Coagulation Profile: No results for input(s): INR, PROTIME in the last 168 hours. Cardiac Enzymes: No results for input(s): CKTOTAL, CKMB, CKMBINDEX, TROPONINI in the last 168 hours. BNP (last 3 results) No results for input(s): PROBNP in the last 8760 hours. HbA1C: Recent Labs    10/16/17 2249  HGBA1C 7.0*   CBG: Recent Labs  Lab 10/18/17 0744 10/18/17 1142 10/18/17 1629 10/18/17 2117 10/19/17 0808  GLUCAP 150* 139* 228* 179* 151*   Lipid Profile: No results for input(s): CHOL, HDL, LDLCALC, TRIG, CHOLHDL, LDLDIRECT in the last 72 hours. Thyroid Function Tests: No results for input(s): TSH, T4TOTAL, FREET4, T3FREE, THYROIDAB in the last 72 hours. Anemia Panel: No results for input(s): VITAMINB12, FOLATE, FERRITIN, TIBC, IRON, RETICCTPCT in the last 72 hours. Sepsis Labs: Recent Labs  Lab 10/16/17 1849 10/16/17 2157  LATICACIDVEN 1.96* 2.17*    Recent Results (from the past 240 hour(s))  Blood culture (routine x 2)     Status: None (Preliminary result)   Collection Time: 10/16/17  9:50 PM  Result Value Ref Range Status   Specimen Description BLOOD RIGHT HAND  Final   Special Requests   Final    BOTTLES DRAWN AEROBIC AND ANAEROBIC Blood Culture adequate volume   Culture   Final    NO GROWTH 2 DAYS Performed at [facility], [address] [place], [place] [address]    Report Status PENDING  Incomplete  Blood culture (routine x 2)     Status: None (Preliminary result)   Collection Time: 10/16/17  9:55 PM  Result Value Ref Range Status    Specimen Description BLOOD LEFT ARM  Final   Special Requests   Final    BOTTLES DRAWN AEROBIC AND ANAEROBIC Blood Culture adequate volume   Culture   Final    NO GROWTH 2 DAYS Performed at [facility], [address] [place], [place] [address]    Report Status PENDING  Incomplete         Radiology Studies: No results found.      Scheduled Meds: . aspirin EC  81 mg Oral Daily  . enoxaparin (LOVENOX) injection  40 mg Subcutaneous Q24H  . feeding supplement (ENSURE ENLIVE)  237 mL Oral BID BM  . fenofibrate  160 mg Oral Daily  . insulin aspart  0-15 Units Subcutaneous TID WC  . metoprolol tartrate  12.5 mg Oral BID  . multivitamin with minerals  1 tablet Oral Daily  . simvastatin  40 mg Oral QHS   Continuous Infusions: . sodium chloride 100 mL/hr at 10/19/17 0553  . piperacillin-tazobactam (ZOSYN)  IV 3.375 g (10/19/17 0553)  . vancomycin Stopped (10/19/17 0858)     LOS: 3 days    Time spent: 35    Deidrea Gaetz C  Markea Ruzich, MD [facility]  If 7PM-7AM, please contact night-coverage [email or web address] Password [facility] 10/19/2017, 10:35 AM

## 2017-10-19 NOTE — Progress Notes (Signed)
Patient arrived to unit via bed from cath lab. L groin site w guaze dressing, Level 0 and clean and dry. Patient oriented to room and unit, telemetry placed and CCMD notified. Instructed on bedrest and keeping L led strait unitl 5pm. He verb understanding but will continue to reinforce and monitor.

## 2017-10-20 ENCOUNTER — Inpatient Hospital Stay (HOSPITAL_COMMUNITY): Payer: Medicare Other | Admitting: Certified Registered"

## 2017-10-20 ENCOUNTER — Encounter (HOSPITAL_COMMUNITY)
Admission: EM | Disposition: A | Discharge status: Skilled Nursing Facility | Payer: Self-pay | Source: Home / Self Care | Attending: Internal Medicine

## 2017-10-20 ENCOUNTER — Encounter (HOSPITAL_COMMUNITY): Payer: Self-pay | Admitting: Anesthesiology

## 2017-10-20 HISTORY — PX: AMPUTATION: SHX166

## 2017-10-20 LAB — GLUCOSE, CAPILLARY
GLUCOSE-CAPILLARY: 158 mg/dL — AB (ref 70–99)
GLUCOSE-CAPILLARY: 126 mg/dL — AB (ref 70–99)
GLUCOSE-CAPILLARY: 166 mg/dL — AB (ref 70–99)
GLUCOSE-CAPILLARY: 305 mg/dL — AB (ref 70–99)
Glucose-Capillary: 160 mg/dL — ABNORMAL HIGH (ref 70–99)
Glucose-Capillary: 107 mg/dL — ABNORMAL HIGH (ref 70–99)
Glucose-Capillary: 117 mg/dL — ABNORMAL HIGH (ref 70–99)

## 2017-10-20 LAB — BASIC METABOLIC PANEL
ANION GAP: 8 (ref 5–15)
BUN: 9 mg/dL (ref 8–23)
CHLORIDE: 102 mmol/L (ref 98–111)
CO2: 21 mmol/L — ABNORMAL LOW (ref 22–32)
Calcium: 8.4 mg/dL — ABNORMAL LOW (ref 8.9–10.3)
Creatinine, Ser: 0.88 mg/dL (ref 0.61–1.24)
Glucose, Bld: 187 mg/dL — ABNORMAL HIGH (ref 70–99)
POTASSIUM: 4.1 mmol/L (ref 3.5–5.1)
SODIUM: 131 mmol/L — AB (ref 135–145)

## 2017-10-20 LAB — CBC
HCT: 29.2 % — ABNORMAL LOW (ref 39.0–52.0)
HEMOGLOBIN: 9.3 g/dL — AB (ref 13.0–17.0)
MCH: 27.8 pg (ref 26.0–34.0)
MCHC: 31.8 g/dL (ref 30.0–36.0)
MCV: 87.4 fL (ref 78.0–100.0)
PLATELETS: 519 10*3/uL — AB (ref 150–400)
RBC: 3.34 MIL/uL — AB (ref 4.22–5.81)
RDW: 14.6 % (ref 11.5–15.5)
WBC: 13.1 10*3/uL — AB (ref 4.0–10.5)

## 2017-10-20 LAB — MAGNESIUM: MAGNESIUM: 1.8 mg/dL (ref 1.7–2.4)

## 2017-10-20 SURGERY — AMPUTATION DIGIT
Anesthesia: General | Laterality: Right

## 2017-10-20 MED ORDER — LIDOCAINE HCL 1 % IJ SOLN
INTRAMUSCULAR | Status: AC
  Filled 2017-10-20: qty 20

## 2017-10-20 MED ORDER — SODIUM CHLORIDE 0.9 % IV SOLN
INTRAVENOUS | Status: DC
  Administered 2017-10-20: 14:00:00 via INTRAVENOUS

## 2017-10-20 MED ORDER — SODIUM CHLORIDE 0.9 % IV SOLN
INTRAVENOUS | Status: DC | PRN
  Administered 2017-10-20: 30 ug/min via INTRAVENOUS

## 2017-10-20 MED ORDER — ONDANSETRON HCL 4 MG/2ML IJ SOLN
INTRAMUSCULAR | Status: DC | PRN
  Administered 2017-10-20: 4 mg via INTRAVENOUS

## 2017-10-20 MED ORDER — FENTANYL CITRATE (PF) 100 MCG/2ML IJ SOLN
INTRAMUSCULAR | Status: AC
  Filled 2017-10-20: qty 2

## 2017-10-20 MED ORDER — FENTANYL CITRATE (PF) 250 MCG/5ML IJ SOLN
INTRAMUSCULAR | Status: AC
  Filled 2017-10-20: qty 5

## 2017-10-20 MED ORDER — FENTANYL CITRATE (PF) 100 MCG/2ML IJ SOLN
25.0000 ug | INTRAMUSCULAR | Status: DC | PRN
  Administered 2017-10-20: 25 ug via INTRAVENOUS

## 2017-10-20 MED ORDER — PIPERACILLIN-TAZOBACTAM 3.375 G IVPB 30 MIN
3.3750 g | INTRAVENOUS | Status: DC
  Filled 2017-10-20: qty 50

## 2017-10-20 MED ORDER — DEXAMETHASONE SODIUM PHOSPHATE 10 MG/ML IJ SOLN
INTRAMUSCULAR | Status: DC | PRN
  Administered 2017-10-20: 10 mg via INTRAVENOUS

## 2017-10-20 MED ORDER — PROPOFOL 10 MG/ML IV BOLUS
INTRAVENOUS | Status: DC | PRN
  Administered 2017-10-20: 70 mg via INTRAVENOUS

## 2017-10-20 MED ORDER — LIDOCAINE 2% (20 MG/ML) 5 ML SYRINGE
INTRAMUSCULAR | Status: AC
  Filled 2017-10-20: qty 5

## 2017-10-20 MED ORDER — FENTANYL CITRATE (PF) 100 MCG/2ML IJ SOLN
INTRAMUSCULAR | Status: DC | PRN
  Administered 2017-10-20 (×2): 50 ug via INTRAVENOUS

## 2017-10-20 MED ORDER — DEXAMETHASONE SODIUM PHOSPHATE 10 MG/ML IJ SOLN
INTRAMUSCULAR | Status: AC
  Filled 2017-10-20: qty 1

## 2017-10-20 MED ORDER — PROPOFOL 10 MG/ML IV BOLUS
INTRAVENOUS | Status: AC
  Filled 2017-10-20: qty 20

## 2017-10-20 MED ORDER — PROMETHAZINE HCL 25 MG/ML IJ SOLN: 6.2500 mg | INTRAMUSCULAR | Status: DC | PRN

## 2017-10-20 MED ORDER — ONDANSETRON HCL 4 MG/2ML IJ SOLN
INTRAMUSCULAR | Status: AC
  Filled 2017-10-20: qty 2

## 2017-10-20 MED ORDER — LIDOCAINE HCL (CARDIAC) PF 100 MG/5ML IV SOSY
PREFILLED_SYRINGE | INTRAVENOUS | Status: DC | PRN
  Administered 2017-10-20: 100 mg via INTRAVENOUS

## 2017-10-20 MED ORDER — 0.9 % SODIUM CHLORIDE (POUR BTL) OPTIME
TOPICAL | Status: DC | PRN
  Administered 2017-10-20: 1000 mL

## 2017-10-20 MED ORDER — PHENYLEPHRINE HCL 10 MG/ML IJ SOLN
INTRAMUSCULAR | Status: DC | PRN
  Administered 2017-10-20: 200 ug via INTRAVENOUS

## 2017-10-20 SURGICAL SUPPLY — 31 items
BANDAGE ACE 4X5 VEL STRL LF (GAUZE/BANDAGES/DRESSINGS) ×3 IMPLANT
BLADE AVERAGE 25MMX9MM (BLADE) ×1
BLADE AVERAGE 25X9 (BLADE) ×1 IMPLANT
BLADE SAW SGTL 81X20 HD (BLADE) IMPLANT
BNDG GAUZE ELAST 4 BULKY (GAUZE/BANDAGES/DRESSINGS) ×3 IMPLANT
CANISTER SUCT 3000ML PPV (MISCELLANEOUS) ×3 IMPLANT
COVER SURGICAL LIGHT HANDLE (MISCELLANEOUS) ×3 IMPLANT
DRAPE EXTREMITY T 121X128X90 (DRAPE) ×3 IMPLANT
DRAPE HALF SHEET 40X57 (DRAPES) ×3 IMPLANT
DRSG ADAPTIC 3X8 NADH LF (GAUZE/BANDAGES/DRESSINGS) ×2 IMPLANT
ELECT REM PT RETURN 9FT ADLT (ELECTROSURGICAL) ×3
ELECTRODE REM PT RTRN 9FT ADLT (ELECTROSURGICAL) ×1 IMPLANT
GAUZE SPONGE 4X4 12PLY STRL (GAUZE/BANDAGES/DRESSINGS) ×3 IMPLANT
GLOVE BIO SURGEON STRL SZ7.5 (GLOVE) ×3 IMPLANT
GOWN STRL REUS W/ TWL LRG LVL3 (GOWN DISPOSABLE) ×2 IMPLANT
GOWN STRL REUS W/ TWL XL LVL3 (GOWN DISPOSABLE) ×1 IMPLANT
GOWN STRL REUS W/TWL LRG LVL3 (GOWN DISPOSABLE) ×3
GOWN STRL REUS W/TWL XL LVL3 (GOWN DISPOSABLE) ×3
KIT BASIN OR (CUSTOM PROCEDURE TRAY) ×3 IMPLANT
KIT TURNOVER KIT B (KITS) ×3 IMPLANT
NDL HYPO 25GX1X1/2 BEV (NEEDLE) IMPLANT
NEEDLE HYPO 25GX1X1/2 BEV (NEEDLE) IMPLANT
NS IRRIG 1000ML POUR BTL (IV SOLUTION) ×3 IMPLANT
PACK GENERAL/GYN (CUSTOM PROCEDURE TRAY) ×3 IMPLANT
PAD ARMBOARD 7.5X6 YLW CONV (MISCELLANEOUS) ×6 IMPLANT
SUT ETHILON 3 0 PS 1 (SUTURE) ×7 IMPLANT
SUT VIC AB 2-0 CTX 36 (SUTURE) ×2 IMPLANT
SYR CONTROL 10ML LL (SYRINGE) IMPLANT
TOWEL GREEN STERILE (TOWEL DISPOSABLE) ×4 IMPLANT
UNDERPAD 30X30 (UNDERPADS AND DIAPERS) ×3 IMPLANT
WATER STERILE IRR 1000ML POUR (IV SOLUTION) ×3 IMPLANT

## 2017-10-20 NOTE — Anesthesia Postprocedure Evaluation (Signed)
Anesthesia Post Note  Patient: Daniel MayhewJohn W Tyler  Procedure(s) Performed: AMPUTATION DIGIT (Right )     Patient location during evaluation: PACU Anesthesia Type: General Level of consciousness: sedated Pain management: pain level controlled Vital Signs Assessment: post-procedure vital signs reviewed and stable Respiratory status: spontaneous breathing and respiratory function stable Cardiovascular status: stable Postop Assessment: no apparent nausea or vomiting Anesthetic complications: no    Last Vitals:  Vitals:   10/20/17 1615 10/20/17 1630  BP: 117/82 (!) 106/59  Pulse: 71 69  Resp: 13 13  Temp:    SpO2: 99% 96%    Last Pain:  Vitals:   10/20/17 1630  TempSrc:   PainSc: Asleep                 Jshon Ibe DANIEL

## 2017-10-20 NOTE — Anesthesia Preprocedure Evaluation (Signed)
Anesthesia Evaluation  Patient identified by MRN, date of birth, ID band Patient awake    Reviewed: Allergy & Precautions, NPO status , Patient's Chart, lab work & pertinent test results, reviewed documented beta blocker date and time   Airway Mallampati: II       Dental no notable dental hx. (+) Teeth Intact   Pulmonary former smoker,    Pulmonary exam normal breath sounds clear to auscultation       Cardiovascular hypertension, Pt. on home beta blockers Normal cardiovascular exam Rhythm:Regular Rate:Normal     Neuro/Psych    GI/Hepatic   Endo/Other  diabetes, Type 2, Oral Hypoglycemic Agents  Renal/GU      Musculoskeletal   Abdominal Normal abdominal exam  (+)   Peds  Hematology  (+) anemia ,   Anesthesia Other Findings   Reproductive/Obstetrics                             Anesthesia Physical Anesthesia Plan  ASA: II  Anesthesia Plan: General   Post-op Pain Management:    Induction: Intravenous  PONV Risk Score and Plan:   Airway Management Planned: LMA  Additional Equipment:   Intra-op Plan:   Post-operative Plan:   Informed Consent: I have reviewed the patients History and Physical, chart, labs and discussed the procedure including the risks, benefits and alternatives for the proposed anesthesia with the patient or authorized representative who has indicated his/her understanding and acceptance.     Plan Discussed with: CRNA and Surgeon  Anesthesia Plan Comments:         Anesthesia Quick Evaluation

## 2017-10-20 NOTE — Anesthesia Procedure Notes (Signed)
Procedure Name: LMA Insertion Date/Time: 10/20/2017 2:53 PM Performed by: Rosiland OzMeyers, Orlando Devereux, CRNA Pre-anesthesia Checklist: Patient identified, Emergency Drugs available, Suction available, Patient being monitored and Timeout performed Patient Re-evaluated:Patient Re-evaluated prior to induction Oxygen Delivery Method: Circle system utilized Preoxygenation: Pre-oxygenation with 100% oxygen Induction Type: IV induction LMA: LMA inserted LMA Size: 4.0 Number of attempts: 1 Placement Confirmation: positive ETCO2 and breath sounds checked- equal and bilateral Tube secured with: Tape Dental Injury: Teeth and Oropharynx as per pre-operative assessment

## 2017-10-20 NOTE — Transfer of Care (Signed)
Immediate Anesthesia Transfer of Care Note  Patient: Daniel MayhewJohn W Tyler  Procedure(s) Performed: AMPUTATION DIGIT (Right )  Patient Location: PACU  Anesthesia Type:General  Level of Consciousness: awake and patient cooperative  Airway & Oxygen Therapy: Patient Spontanous Breathing  Post-op Assessment: Report given to RN and Post -op Vital signs reviewed and stable  Post vital signs: Reviewed and stable  Last Vitals:  Vitals Value Taken Time  BP 135/59 10/20/2017  3:31 PM  Temp    Pulse 79 10/20/2017  3:32 PM  Resp 15 10/20/2017  3:32 PM  SpO2 99 % 10/20/2017  3:32 PM  Vitals shown include unvalidated device data.  Last Pain:  Vitals:   10/20/17 0855  TempSrc:   PainSc: 3          Complications: No apparent anesthesia complications

## 2017-10-20 NOTE — Progress Notes (Signed)
Pt received from PACU. C/A/OX3. Tlebox applied.  Compression wrap with minimal drainage to R foot/ Great toe. Vitals stable. Diet ordered.

## 2017-10-20 NOTE — Progress Notes (Signed)
Patient has HCPOA and not legal guardian. Copy of document in Epic

## 2017-10-20 NOTE — Progress Notes (Addendum)
  Progress Note    10/20/2017 7:40 AM 1 Day Post-Op  Subjective:  Wants the room warmer; says his toe is sore.  Tm 99.2 VSS  Vitals:   10/20/17 0348 10/20/17 0723  BP: 130/60   Pulse: 93   Resp: (!) 24   Temp: 98.4 F (36.9 C) 99.2 F (37.3 C)  SpO2: 97%     Physical Exam: Cardiac:  regular Lungs:  Non labored Incisions:  Left groin without hematoma;  Extremities:  Right foot with brisk monophasic peroneal/PT doppler signals > AT doppler signal; right great toe with gangrenous changes and malodorous    CBC    Component Value Date/Time   WBC 13.1 (H) 10/20/2017 0308   RBC 3.34 (L) 10/20/2017 0308   HGB 9.3 (L) 10/20/2017 0308   HCT 29.2 (L) 10/20/2017 0308   PLT 519 (H) 10/20/2017 0308   MCV 87.4 10/20/2017 0308   MCH 27.8 10/20/2017 0308   MCHC 31.8 10/20/2017 0308   RDW 14.6 10/20/2017 0308   LYMPHSABS 1.1 10/16/2017 1842   MONOABS 1.2 (H) 10/16/2017 1842   EOSABS 0.1 10/16/2017 1842   BASOSABS 0.1 10/16/2017 1842    BMET    Component Value Date/Time   NA 131 (L) 10/20/2017 0308   K 4.1 10/20/2017 0308   CL 102 10/20/2017 0308   CO2 21 (L) 10/20/2017 0308   GLUCOSE 187 (H) 10/20/2017 0308   BUN 9 10/20/2017 0308   CREATININE 0.88 10/20/2017 0308   CREATININE 0.66 06/07/2011 1645   CALCIUM 8.4 (L) 10/20/2017 0308   GFRNONAA >60 10/20/2017 0308   GFRAA >60 10/20/2017 0308    INR    Component Value Date/Time   INR 0.92 07/13/2012 1440     Intake/Output Summary (Last 24 hours) at 10/20/2017 0740 Last data filed at 10/20/2017 0356 Gross per 24 hour  Intake 120 ml  Output 400 ml  Net -280 ml     Assessment:  77 y.o. male is s/p:  Procedure Performed: 1.  Ultrasound-guided access of the left common femoral artery 2.  Aortogram with bilateral lower extremity run-off 3.  Right lower extremity arteriogram with selection of tertiary branches 4.  Right below-knee popliteal anastomosis and native peroneal artery angioplasty (2.5 mm x 100 mm  Sterling and 3 mm x 20 mm Sterling) 5.  Right common iliac artery stent (7 mm x 19 mm VBX) 6.  Mynx closure of the left common femoral artery 7.  116 minutes of moderate sedation  1 Day Post-Op  Plan: -pt doing well this am-left groin without hematoma.  Right foot with brisk monophasic peroneal/PT doppler signals > AT doppler signal -right great toe with gangrenous changes    Doreatha MassedSamantha Rhyne, PA-C [facility] [phone] 10/20/2017 7:40 AM   I have independently interviewed and examined patient and agree with PA assessment and plan above.  Plan for OR today right great toe amputation.  Lynn Sissel C. Randie Heinzain, MD [facility] [place] Office: [phone] Pager: [phone]

## 2017-10-20 NOTE — Progress Notes (Signed)
PROGRESS NOTE    Daniel Tyler  ZOX:[id number] DOB: [birth year] DOA: 10/16/2017 PCP: Dois Davenport, MD   Brief Narrative: 77 year old with past medical history relevant for peripheral vascular disease status post right femoropopliteal bypass on 07/14/2012, coronary artery disease status post CABG in 07/21/2011, carotid endarterectomy, type 2 diabetes on oral hypoglycemics, hypertension, hyperlipidemia who presented with worsening pain of right gangrenous toe.   Assessment & Plan:   Principal Problem:   Osteomyelitis of great toe of right foot (HCC) Active Problems:   Insulin dependent type 2 diabetes mellitus, controlled (HCC)   Hypertension   Atherosclerosis of native arteries of the extremities with ulceration (HCC)   Gangrene of toe of right foot (HCC)   Cellulitis of great toe of right foot   Malnutrition of moderate degree   #) Right toe osteomyelitis in the setting of peripheral vascular disease status post femoropopliteal bypass in 2014:  -Status post lower extremity arteriogram with 2 stent placements on 10/19/2017 by vascular surgery -Continue clopidogrel 75 mg daily for stent placement -Continue IV vancomycin and Zosyn started 10/16/2017 - blood cultures ordered 10/16/2017, no growth to date -Vascular surgery consult appreciate recommendations -Plan for right great toe amputation today on 10/20/2017 -Physical therapy afterwards  #) Coronary artery disease status post CABG/cerebrovascular disease: - Continue metoprolol tartrate 12.5 mg twice daily -Continue simvastatin 40 mg nightly  #) Hypertension/hyperlipidemia: -Continue statin -Continue fenofibrate 150 mg daily -Continue Toprol tartrate 12.5 mg twice daily  #) Type 2 diabetes: -Hold home glipizide 5 mg twice daily -Hold home metformin thousand milligrams twice daily -Sliding scale insulin, AC at bedtime  Fluids: Tolerating p.o. Electrodes: Monitor and supplement Nutrition Carb restricted  Prophylaxis:  Enoxaparin  Disposition: Pending vascular surgery amputation of great toe and physical therapy evaluation   Full code   Consultants:   Vascular surgery  Procedures:   None  Antimicrobials:   IV vancomycin and Zosyn started 10/16/2017   Subjective: Patient is in better spirits this morning.  He denies any nausea, vomiting, diarrhea, cough, congestion.  He is eager to have his surgery for his great toe and go home.  Objective: Vitals:   10/19/17 2136 10/20/17 0348 10/20/17 0723 10/20/17 0854  BP: 137/75 130/60  118/60  Pulse: 94 93  80  Resp: 19 (!) 24    Temp:  98.4 F (36.9 C) 99.2 F (37.3 C)   TempSrc:  Oral Oral   SpO2:  97%    Weight:      Height:        Intake/Output Summary (Last 24 hours) at 10/20/2017 0916 Last data filed at 10/20/2017 0356 Gross per 24 hour  Intake 120 ml  Output 400 ml  Net -280 ml   Filed Weights   10/16/17 1814  Weight: 59 kg    Examination:  General exam: Appears calm and comfortable  Respiratory system: Clear to auscultation. Respiratory effort normal. Cardiovascular system: Distant heart sounds, regular rate and rhythm, no murmurs Gastrointestinal system: Abdomen is nondistended, soft and nontender. No organomegaly or masses felt. Normal bowel sounds heard. Central nervous system: Alert and oriented. No focal neurological deficits. Extremities: No lower extremity edema, no pulses palpable but dopplerable pulses reported Skin: Right toe is black, purulent, arteriogram entry site is clean dry and intact with no hematoma Psychiatry: Judgement and insight appear normal. Mood & affect appropriate.     Data Reviewed: I have personally reviewed following labs and imaging studies  CBC: Recent Labs  Lab 10/16/17 1842 10/18/17 0520 10/20/17  0308  WBC 18.6* 12.5* 13.1*  NEUTROABS 16.1*  --   --   HGB 10.7* 10.3* 9.3*  HCT 33.5* 32.0* 29.2*  MCV 88.6 87.9 87.4  PLT 519* 554* 519*   Basic Metabolic Panel: Recent Labs    Lab 10/16/17 1842 10/18/17 0520 10/19/17 0324 10/20/17 0308  NA 129* 132* 130* 131*  K 4.9 5.3* 4.2 4.1  CL 100 104 101 102  CO2 20* 20* 19* 21*  GLUCOSE 242* 123* 167* 187*  BUN 16 11 7* 9  CREATININE 1.11 0.99 0.91 0.88  CALCIUM 9.0 8.4* 8.5* 8.4*  MG  --  1.9  --  1.8   GFR: Estimated Creatinine Clearance: 58.7 mL/min (by C-G formula based on SCr of 0.88 mg/dL). Liver Function Tests: No results for input(s): AST, ALT, ALKPHOS, BILITOT, PROT, ALBUMIN in the last 168 hours. No results for input(s): LIPASE, AMYLASE in the last 168 hours. No results for input(s): AMMONIA in the last 168 hours. Coagulation Profile: No results for input(s): INR, PROTIME in the last 168 hours. Cardiac Enzymes: No results for input(s): CKTOTAL, CKMB, CKMBINDEX, TROPONINI in the last 168 hours. BNP (last 3 results) No results for input(s): PROBNP in the last 8760 hours. HbA1C: No results for input(s): HGBA1C in the last 72 hours. CBG: Recent Labs  Lab 10/19/17 0808 10/19/17 1125 10/19/17 1833 10/19/17 2136 10/20/17 0618  GLUCAP 151* 149* 176* 244* 160*   Lipid Profile: No results for input(s): CHOL, HDL, LDLCALC, TRIG, CHOLHDL, LDLDIRECT in the last 72 hours. Thyroid Function Tests: No results for input(s): TSH, T4TOTAL, FREET4, T3FREE, THYROIDAB in the last 72 hours. Anemia Panel: No results for input(s): VITAMINB12, FOLATE, FERRITIN, TIBC, IRON, RETICCTPCT in the last 72 hours. Sepsis Labs: Recent Labs  Lab 10/16/17 1849 10/16/17 2157  LATICACIDVEN 1.96* 2.17*    Recent Results (from the past 240 hour(s))  Blood culture (routine x 2)     Status: None (Preliminary result)   Collection Time: 10/16/17  9:50 PM  Result Value Ref Range Status   Specimen Description BLOOD RIGHT HAND  Final   Special Requests   Final    BOTTLES DRAWN AEROBIC AND ANAEROBIC Blood Culture adequate volume   Culture   Final    NO GROWTH 3 DAYS Performed at [facility], [address]  [place], [place] [address]    Report Status PENDING  Incomplete  Blood culture (routine x 2)     Status: None (Preliminary result)   Collection Time: 10/16/17  9:55 PM  Result Value Ref Range Status   Specimen Description BLOOD LEFT ARM  Final   Special Requests   Final    BOTTLES DRAWN AEROBIC AND ANAEROBIC Blood Culture adequate volume   Culture   Final    NO GROWTH 3 DAYS Performed at [facility], [address] [place], [place] [address]    Report Status PENDING  Incomplete         Radiology Studies: No results found.      Scheduled Meds: . aspirin EC  81 mg Oral Daily  . clopidogrel  75 mg Oral Q breakfast  . enoxaparin (LOVENOX) injection  40 mg Subcutaneous Q24H  . feeding supplement (ENSURE ENLIVE)  237 mL Oral BID BM  . fenofibrate  160 mg Oral Daily  . insulin aspart  0-15 Units Subcutaneous TID WC  . metoprolol tartrate  12.5 mg Oral BID  . multivitamin with minerals  1 tablet Oral Daily  . simvastatin  40 mg Oral QHS  . sodium chloride flush  3 mL Intravenous Q12H   Continuous Infusions: . sodium chloride 100 mL/hr at 10/19/17 1309  . sodium chloride    . piperacillin-tazobactam (ZOSYN)  IV 3.375 g (10/20/17 0639)  . vancomycin 1,000 mg (10/19/17 2147)     LOS: 4 days    Time spent: 35    Delaine LameShrey C Efren Kross, MD [facility]  If 7PM-7AM, please contact night-coverage [email or web address] Password [email or web address] 10/20/2017, 9:16 AM

## 2017-10-20 NOTE — Op Note (Signed)
    Patient name: Daniel Tyler MRN: [id number] DOB: Oct 02, 1940 Sex: male  10/20/2017 Pre-operative Diagnosis: Critical right lower extremity ischemia status post revascularization Post-operative diagnosis:  Same Surgeon:  Luanna SalkBrandon C. Randie Heinzain, MD Procedure Performed: Right great toe amputation  Indications: 77 year old male has a history of a right femoral to popliteal artery bypass.  He now has revascularization of the distal bypass as well as common iliac artery stenting.  He is indicated for toe amputation.  Findings: There is adequate bleeding within the wound bed.  We cannot approximate lateral aspect of the incision and this was left open.   Procedure:  The patient was identified in the holding area and taken to the operating room where he was placed supine on the operative table and LMA anesthesia was induced.  He was sterilely prepped draped in the right lower extremity usual fashion given antibiotics and a timeout was called.  A tennis racquet type incision was made around the right great toe.  I dissected down to the level of the bone using elevator to elevate the soft tissue.  I transected the metatarsal head with CB 7 saw.  A rasp was used to smooth the bone after the toe was removed.  I debrided all of the necrotic tissue around the toe until there was adequate bleeding in the wound bed.  He was thoroughly irrigated.  I used 2-0 Vicryl interrupted to close the deep tissue and then 3-0 nylon to approximate the skin.  It would not approximate laterally towards the second toe so I left this open for approximately 1 cm.  He was then allowed away from anesthesia having tolerated procedure without immediate complication.  EBL 10 cc    [name] C. Randie Heinzain, MD [facility] [place] Office: [phone] Pager: [phone]

## 2017-10-21 ENCOUNTER — Encounter (HOSPITAL_COMMUNITY): Payer: Self-pay | Admitting: Vascular Surgery

## 2017-10-21 DIAGNOSIS — M869 Osteomyelitis, unspecified: Secondary | ICD-10-CM

## 2017-10-21 LAB — BASIC METABOLIC PANEL
Anion gap: 9 (ref 5–15)
BUN: 11 mg/dL (ref 8–23)
CHLORIDE: 104 mmol/L (ref 98–111)
CO2: 22 mmol/L (ref 22–32)
CREATININE: 1.07 mg/dL (ref 0.61–1.24)
Calcium: 8.2 mg/dL — ABNORMAL LOW (ref 8.9–10.3)
Calcium: 8.4 mg/dL — ABNORMAL LOW (ref 8.9–10.3)
Chloride: 103 mmol/L (ref 98–111)
Creatinine, Ser: 1 mg/dL (ref 0.61–1.24)
GFR calc Af Amer: >60 mL/min (ref >60)
GFR calc Af Amer: >60 mL/min (ref >60)
GFR calc non Af Amer: >60 mL/min (ref >60)
GFR calc non Af Amer: >60 mL/min (ref >60)
GLUCOSE: 200 mg/dL — AB (ref 70–99)
Glucose, Bld: 341 mg/dL — ABNORMAL HIGH (ref 70–99)
POTASSIUM: 4.3 mmol/L (ref 3.5–5.1)
Sodium: 131 mmol/L — ABNORMAL LOW (ref 135–145)
Sodium: 135 mmol/L (ref 135–145)

## 2017-10-21 LAB — GLUCOSE, CAPILLARY
GLUCOSE-CAPILLARY: 321 mg/dL — AB (ref 70–99)
GLUCOSE-CAPILLARY: 261 mg/dL — AB (ref 70–99)
Glucose-Capillary: 263 mg/dL — ABNORMAL HIGH (ref 70–99)
Glucose-Capillary: 255 mg/dL — ABNORMAL HIGH (ref 70–99)

## 2017-10-21 LAB — CULTURE, BLOOD (ROUTINE X 2)
CULTURE: NO GROWTH
Culture: NO GROWTH
SPECIAL REQUESTS: ADEQUATE
Special Requests: ADEQUATE

## 2017-10-21 LAB — VANCOMYCIN, TROUGH: Vancomycin Tr: 12 ug/mL — ABNORMAL LOW (ref 15–20)

## 2017-10-21 LAB — CBC
HCT: 24.7 % — ABNORMAL LOW (ref 39.0–52.0)
Hemoglobin: 8 g/dL — ABNORMAL LOW (ref 13.0–17.0)
MCH: 28.2 pg (ref 26.0–34.0)
MCHC: 32.4 g/dL (ref 30.0–36.0)
MCV: 87 fL (ref 78.0–100.0)
Platelets: 486 10*3/uL — ABNORMAL HIGH (ref 150–400)
RBC: 2.84 MIL/uL — ABNORMAL LOW (ref 4.22–5.81)
RDW: 14.6 % (ref 11.5–15.5)
WBC: 14.7 10*3/uL — ABNORMAL HIGH (ref 4.0–10.5)

## 2017-10-21 LAB — BASIC METABOLIC PANEL WITH GFR
Anion gap: 6 (ref 5–15)
BUN: 13 mg/dL (ref 8–23)
CO2: 22 mmol/L (ref 22–32)
Potassium: 4.6 mmol/L (ref 3.5–5.1)

## 2017-10-21 MED ORDER — VANCOMYCIN HCL 10 G IV SOLR
1250.0000 mg | INTRAVENOUS | Status: DC
  Administered 2017-10-21 – 2017-10-24 (×3): 1250 mg via INTRAVENOUS
  Filled 2017-10-21 (×3): qty 1250

## 2017-10-21 NOTE — Progress Notes (Signed)
Pharmacy Antibiotic Note  Daniel Tyler is a 77 y.o. male admitted on 10/16/2017 with wound infection.  Pharmacy has been consulted for Vancomycin and zosyn dosing.   S/p amputation 8/27 Day # 6 of antibiotics Blood cultures negative to date   Plan: Continue Vancomycin 1000 mg IV Q 24 hours  Continue Zosyn 3.375 gm IV Q 8 hours Consider stopping antibiotics?  Height: 5\' 9"  (175.3 cm) Weight: 130 lb (59 kg) IBW/kg (Calculated) : 70.7  Temp (24hrs), Avg:97.8 F (36.6 C), Min:97 F (36.1 C), Max:98.1 F (36.7 C)  Recent Labs  Lab 10/16/17 1842 10/16/17 1849 10/16/17 2157 10/18/17 0520 10/19/17 0324 10/20/17 0308 10/21/17 0314 10/21/17 0844  WBC 18.6*  --   --  12.5*  --  13.1* 14.7*  --   CREATININE 1.11  --   --  0.99 0.91 0.88 1.00 1.07  LATICACIDVEN  --  1.96* 2.17*  --   --   --   --   --     Estimated Creatinine Clearance: 48.2 mL/min (by C-G formula based on SCr of 1.07 mg/dL).    No Known Allergies  Antimicrobials this admission: Vanc 8/23 >>  Zosyn 8/23 >>   Dose adjustments this admission: None   Microbiology results: 8/23 BCx: NGTD  Thank you Okey RegalLisa Chasady Longwell, PharmD [phone] 10/21/2017 10:18 AM

## 2017-10-21 NOTE — Progress Notes (Addendum)
PROGRESS NOTE    Daniel Tyler  WUJ:[id number] DOB: January 10, 1941 DOA: 10/16/2017 PCP: Dois Davenport, MD   Brief Narrative: Patient is a 77 year old male with past medical history of peripheral vascular disease status post femoropopliteal bypass on 07/14/2012, coronary artery disease status post CABG in 2013, carotid endarterectomy, type 2 diabetes mellitus, hypertension, hyperlipidemia who presented here with worsening pain of the right toe.  Found to have gangrene of the right toe on presentation.  Vascular surgery consulted.  Underwent right lower extremity arteriogram 2 stents placement and also amputation of right gangrenous toe.  Assessment & Plan:   Principal Problem:   Osteomyelitis of great toe of right foot (HCC) Active Problems:   Insulin dependent type 2 diabetes mellitus, controlled (HCC)   Hypertension   Atherosclerosis of native arteries of the extremities with ulceration (HCC)   Gangrene of toe of right foot (HCC)   Cellulitis of great toe of right foot   Malnutrition of moderate degree  Right toe osteomyelitis/peripheral vascular disease: Status post right lower extremity arteriogram with 2 stents placement on 10/19/2017 by vascular surgery.  Also underwent a right toe amputation which was found to be gangrenous.  Continue broad-spectrum antibiotics through  IV until he is discharged.  Currently he is on vancomycin and Zosyn.  Blood cultures have been negative so far.  Waiting for physical therapy assessment today. He has a long history of peripheral vascular disease.  Follow-up with vascular surgery as an outpatient after discharge.  Coronary artery disease: Status post CABG.  Continue metoprolol and statin.  Continue Plavix,aspirin,statin.  Hypertension/hyperlipidemia: Continue current medications.  Also on fenofibrate.  He is normotensive at present.  Diabetes mellitus: Continue sliding scale insulin here.  Patient is on glipizide and metformin at home.  Anemia: His  baseline hemoglobin is around 10.  Hemoglobin noted to be 8 today.  Could be associated with acute blood loss anemia secondary to surgery.  We will continue to monitor H&H.  No need of transfusion at present.  DVT prophylaxis: Lovenox Code Status: Full Family Communication: None present at the bed otherwise appears comfortable Disposition Plan: Pending PT evaluation.  Awaiting vascular surgery clearance for discharge.   Consultants: Vascular surgery  Procedures: Right lower extremity arteriogram with stent placement, right toe amputation  Antimicrobials: Vancomycin and Zosyn started on 10/16/2017  Subjective: Patient seen this morning.  Remains comfortable.  Hemodynamically stable.  Complains of some pain on the amputation site.  Objective: Vitals:   10/20/17 2219 10/21/17 0343 10/21/17 0344 10/21/17 0752  BP: 108/63 (!) 115/50 (!) 115/50 (!) 117/54  Pulse: 78  65   Resp:  (!) 25  18  Temp:  97.8 F (36.6 C)  98.1 F (36.7 C)  TempSrc:  Oral  Oral  SpO2:  94%    Weight:      Height:        Intake/Output Summary (Last 24 hours) at 10/21/2017 0849 Last data filed at 10/21/2017 0343 Gross per 24 hour  Intake 1561.7 ml  Output 960 ml  Net 601.7 ml   Filed Weights   10/16/17 1814  Weight: 59 kg    Examination:  General exam: Appears calm and comfortable ,Not in distress,elderly gentleman HEENT:PERRL,Oral mucosa moist, Ear/Nose normal on gross exam Respiratory system: Bilateral equal air entry, normal vesicular breath sounds, no wheezes or crackles  Cardiovascular system: S1 & S2 heard, RRR. No JVD, murmurs, rubs, gallops or clicks. No pedal edema. Gastrointestinal system: Abdomen is nondistended, soft and nontender. No organomegaly  or masses felt. Normal bowel sounds heard. Central nervous system: Alert and oriented. No focal neurological deficits. Extremities: No edema, no clubbing ,no cyanosis, right foot covered with dressing. Skin: No rashes, lesions or ulcers,no  icterus ,no pallor MSK: Normal muscle bulk,tone ,power Psychiatry: Judgement and insight appear normal. Mood & affect appropriate.     Data Reviewed: I have personally reviewed following labs and imaging studies  CBC: Recent Labs  Lab 10/16/17 1842 10/18/17 0520 10/20/17 0308 10/21/17 0314  WBC 18.6* 12.5* 13.1* 14.7*  NEUTROABS 16.1*  --   --   --   HGB 10.7* 10.3* 9.3* 8.0*  HCT 33.5* 32.0* 29.2* 24.7*  MCV 88.6 87.9 87.4 87.0  PLT 519* 554* 519* 486*   Basic Metabolic Panel: Recent Labs  Lab 10/16/17 1842 10/18/17 0520 10/19/17 0324 10/20/17 0308 10/21/17 0314  NA 129* 132* 130* 131* 131*  K 4.9 5.3* 4.2 4.1 4.6  CL 100 104 101 102 103  CO2 20* 20* 19* 21* 22  GLUCOSE 242* 123* 167* 187* 341*  BUN 16 11 7* 9 13  CREATININE 1.11 0.99 0.91 0.88 1.00  CALCIUM 9.0 8.4* 8.5* 8.4* 8.2*  MG  --  1.9  --  1.8  --    GFR: Estimated Creatinine Clearance: 51.6 mL/min (by C-G formula based on SCr of 1 mg/dL). Liver Function Tests: No results for input(s): AST, ALT, ALKPHOS, BILITOT, PROT, ALBUMIN in the last 168 hours. No results for input(s): LIPASE, AMYLASE in the last 168 hours. No results for input(s): AMMONIA in the last 168 hours. Coagulation Profile: No results for input(s): INR, PROTIME in the last 168 hours. Cardiac Enzymes: No results for input(s): CKTOTAL, CKMB, CKMBINDEX, TROPONINI in the last 168 hours. BNP (last 3 results) No results for input(s): PROBNP in the last 8760 hours. HbA1C: No results for input(s): HGBA1C in the last 72 hours. CBG: Recent Labs  Lab 10/20/17 1358 10/20/17 1538 10/20/17 1723 10/20/17 2123 10/21/17 0609  GLUCAP 126* 117* 166* 305* 263*   Lipid Profile: No results for input(s): CHOL, HDL, LDLCALC, TRIG, CHOLHDL, LDLDIRECT in the last 72 hours. Thyroid Function Tests: No results for input(s): TSH, T4TOTAL, FREET4, T3FREE, THYROIDAB in the last 72 hours. Anemia Panel: No results for input(s): VITAMINB12, FOLATE, FERRITIN,  TIBC, IRON, RETICCTPCT in the last 72 hours. Sepsis Labs: Recent Labs  Lab 10/16/17 1849 10/16/17 2157  LATICACIDVEN 1.96* 2.17*    Recent Results (from the past 240 hour(s))  Blood culture (routine x 2)     Status: None   Collection Time: 10/16/17  9:50 PM  Result Value Ref Range Status   Specimen Description BLOOD RIGHT HAND  Final   Special Requests   Final    BOTTLES DRAWN AEROBIC AND ANAEROBIC Blood Culture adequate volume   Culture   Final    NO GROWTH 5 DAYS Performed at [facility], [address] [place], [place] [address]    Report Status 10/21/2017 FINAL  Final  Blood culture (routine x 2)     Status: None   Collection Time: 10/16/17  9:55 PM  Result Value Ref Range Status   Specimen Description BLOOD LEFT ARM  Final   Special Requests   Final    BOTTLES DRAWN AEROBIC AND ANAEROBIC Blood Culture adequate volume   Culture   Final    NO GROWTH 5 DAYS Performed at [facility], [address], [place], [place] [address]    Report Status 10/21/2017 FINAL  Final  Radiology Studies: No results found.      Scheduled Meds: . aspirin EC  81 mg Oral Daily  . clopidogrel  75 mg Oral Q breakfast  . enoxaparin (LOVENOX) injection  40 mg Subcutaneous Q24H  . feeding supplement (ENSURE ENLIVE)  237 mL Oral BID BM  . fenofibrate  160 mg Oral Daily  . insulin aspart  0-15 Units Subcutaneous TID WC  . metoprolol tartrate  12.5 mg Oral BID  . multivitamin with minerals  1 tablet Oral Daily  . simvastatin  40 mg Oral QHS  . sodium chloride flush  3 mL Intravenous Q12H   Continuous Infusions: . sodium chloride 20 mL/hr at 10/21/17 0332  . sodium chloride    . sodium chloride 10 mL/hr at 10/20/17 1404  . piperacillin-tazobactam (ZOSYN)  IV 3.375 g (10/21/17 0640)  . vancomycin Stopped (10/20/17 2319)     LOS: 5 days    Time spent:25 mins. More than 50% of that time was spent in counseling and/or coordination of care.      Burnadette PopAmrit  Latanya Hemmer, MD [facility] Pager [phone]  If 7PM-7AM, please contact night-coverage [email or web address] Password [facility] 10/21/2017, 8:49 AM

## 2017-10-21 NOTE — Progress Notes (Signed)
Pharmacy Antibiotic Note  Daniel Tyler is a 77 y.o. male admitted on 10/16/2017 with wound infection, ?osteomyelitis, now s/p amputation .  Pharmacy has been consulted for Vancomycin dosing. Renal function stable. WBC still up some.   Vancomycin trough is 12  Plan: Increase vancomycin to 1250 mg IV q24h Re-check trough as indicated  F/U length of treatment   Height: 5\' 9"  (175.3 cm) Weight: 130 lb (59 kg) IBW/kg (Calculated) : 70.7  Temp (24hrs), Avg:97.7 F (36.5 C), Min:97.4 F (36.3 C), Max:98.1 F (36.7 C)  Recent Labs  Lab 10/16/17 1842 10/16/17 1849 10/16/17 2157 10/18/17 0520 10/19/17 0324 10/20/17 0308 10/21/17 0314 10/21/17 0844 10/21/17 2214  WBC 18.6*  --   --  12.5*  --  13.1* 14.7*  --   --   CREATININE 1.11  --   --  0.99 0.91 0.88 1.00 1.07  --   LATICACIDVEN  --  1.96* 2.17*  --   --   --   --   --   --   VANCOTROUGH  --   --   --   --   --   --   --   --  12*    Estimated Creatinine Clearance: 48.2 mL/min (by C-G formula based on SCr of 1.07 mg/dL).    No Known Allergies  Abran DukeLedford, Stacyann Mcconaughy 10/21/2017 11:13 PM

## 2017-10-21 NOTE — Evaluation (Signed)
Physical Therapy Evaluation Patient Details Name: Daniel Tyler MRN: [id number] DOB: May 19, 1940 Today's Date: 10/21/2017   History of Present Illness  Patient is a 77 year old male with past medical history of peripheral vascular disease status post femoropopliteal bypass on 07/14/2012, coronary artery disease status post CABG in 2013, carotid endarterectomy, type 2 diabetes mellitus, hypertension, hyperlipidemia who presented here with worsening pain of the right toe.  Found to have gangrene of the right toe on presentation.  Vascular surgery consulted.  Underwent right lower extremity arteriogram 2 stents placement and also amputation of right gangrenous toe.  Clinical Impression  Pt admitted with above diagnosis. Pt currently with functional limitations due to the deficits listed below (see PT Problem List). Pt was able to ambulate a few steps stand pivot to the chair with RW with min guard assist.  Called PA to ask about weight bearing status and post op shoe as nothing in orders.  Performed heel weight bearing this am.  PA confirmed heel weight bearing is preferred and will talk with MD regarding shoe.  Discussed d/c plan with PA as pt states he will be fine at home however he states that he can't use his RW in the home and then says he can.  He also states he will be fine by himself.  May need SNF depending on progress over next few days.   Pt will benefit from skilled PT to increase their independence and safety with mobility to allow discharge to the venue listed below.      Follow Up Recommendations Home health PT;Supervision/Assistance - 24 hour    Equipment Recommendations  None recommended by PT    Recommendations for Other Services       Precautions / Restrictions Precautions Precautions: Fall Required Braces or Orthoses: Other Brace/Splint(PA to order cast shoe) Restrictions Weight Bearing Restrictions: Yes RLE Weight Bearing: (heel weight bearing)      Mobility  Bed  Mobility Overal bed mobility: Independent                Transfers Overall transfer level: Needs assistance Equipment used: Rolling walker (2 wheeled) Transfers: Sit to/from [employer] Sit to Stand: Supervision Stand pivot transfers: Min guard       General transfer comment: Pt did not need assist but did need cues for hand placement. cues needed for heel weight bearing  Ambulation/Gait                Stairs            Wheelchair Mobility    Modified Rankin (Stroke Patients Only)       Balance Overall balance assessment: Needs assistance Sitting-balance support: No upper extremity supported;Feet supported Sitting balance-Leahy Scale: Fair     Standing balance support: Bilateral upper extremity supported;During functional activity Standing balance-Leahy Scale: Poor Standing balance comment: relies on UE support                             Pertinent Vitals/Pain Pain Assessment: Faces Pain Score: 2  Faces Pain Scale: Hurts a little bit Pain Location: right foot Pain Descriptors / Indicators: Aching;Grimacing;Guarding Pain Intervention(s): Limited activity within patient's tolerance;Monitored during session;Repositioned    Home Living Family/patient expects to be discharged to:: Private residence Living Arrangements: Alone Available Help at Discharge: Friend(s);Available PRN/intermittently Type of Home: Mobile home Home Access: Stairs to enter Entrance Stairs-Rails: Left Entrance Stairs-Number of Steps: 4 Home Layout: One level Home Equipment: Dan Humphreys -  2 wheels Additional Comments: His neighbor Gabriel RungJoe takes him wherever he wants to go    Prior Function Level of Independence: Independent with assistive device(s)         Comments: only used RW when he felt bad but it doesn't fit in livingt room     Hand Dominance   Dominant Hand: Right    Extremity/Trunk Assessment   Upper Extremity Assessment Upper  Extremity Assessment: Defer to OT evaluation    Lower Extremity Assessment Lower Extremity Assessment: RLE deficits/detail RLE Deficits / Details: knee and hip WFL RLE: Unable to fully assess due to pain;Unable to fully assess due to immobilization    Cervical / Trunk Assessment Cervical / Trunk Assessment: Normal  Communication   Communication: No difficulties  Cognition Arousal/Alertness: Awake/alert Behavior During Therapy: Flat affect Overall Cognitive Status: Impaired/Different from baseline Area of Impairment: Safety/judgement;Problem solving                         Safety/Judgement: Decreased awareness of safety;Decreased awareness of deficits   Problem Solving: Slow processing;Difficulty sequencing;Requires verbal cues        General Comments      Exercises     Assessment/Plan    PT Assessment Patient needs continued PT services  PT Problem List Decreased activity tolerance;Decreased balance;Decreased mobility;Decreased knowledge of use of DME;Decreased safety awareness;Decreased knowledge of precautions;Pain;Decreased skin integrity       PT Treatment Interventions DME instruction;Gait training;Functional mobility training;Therapeutic activities;Therapeutic exercise;Stair training;Balance training;Patient/family education    PT Goals (Current goals can be found in the Care Plan section)  Acute Rehab PT Goals Patient Stated Goal: to get home PT Goal Formulation: With patient Time For Goal Achievement: 11/04/17 Potential to Achieve Goals: Good    Frequency Min 3X/week   Barriers to discharge Decreased caregiver support has neighbor only to assist him    Co-evaluation               AM-PAC PT "6 Clicks" Daily Activity  Outcome Measure Difficulty turning over in bed (including adjusting bedclothes, sheets and blankets)?: None Difficulty moving from lying on back to sitting on the side of the bed? : None Difficulty sitting down on and  standing up from a chair with arms (e.g., wheelchair, bedside commode, etc,.)?: A Little Help needed moving to and from a bed to chair (including a wheelchair)?: A Little Help needed walking in hospital room?: A Little Help needed climbing 3-5 steps with a railing? : A Little 6 Click Score: 20    End of Session Equipment Utilized During Treatment: Gait belt Activity Tolerance: Patient limited by fatigue Patient left: in chair;with call bell/phone within reach;with chair alarm set Nurse Communication: Mobility status PT Visit Diagnosis: Unsteadiness on feet (R26.81);Muscle weakness (generalized) (M62.81);Pain Pain - Right/Left: Right Pain - part of body: Ankle and joints of foot    Time: 1031-1051 PT Time Calculation (min) (ACUTE ONLY): 20 min   Charges:   PT Evaluation $PT Eval Moderate Complexity: 1 Mod          Shelagh Rayman,PT Acute Rehabilitation [phone] (pager)   Berline Lopesawn F Chesky Heyer 10/21/2017, 12:56 PM

## 2017-10-21 NOTE — Progress Notes (Addendum)
  Progress Note    10/21/2017 8:12 AM 1 Day Post-Op  Subjective:  Says his foot is sore  Afebrile VSS  Vitals:   10/21/17 0344 10/21/17 0752  BP: (!) 115/50 (!) 117/54  Pulse: 65   Resp:  18  Temp:  98.1 F (36.7 C)  SpO2:      Physical Exam: Cardiac:  regular Lungs:  Non labored Incisions:  Right great toe amp site wrapped Extremities:  +right peroneal doppler signal   CBC    Component Value Date/Time   WBC 14.7 (H) 10/21/2017 0314   RBC 2.84 (L) 10/21/2017 0314   HGB 8.0 (L) 10/21/2017 0314   HCT 24.7 (L) 10/21/2017 0314   PLT 486 (H) 10/21/2017 0314   MCV 87.0 10/21/2017 0314   MCH 28.2 10/21/2017 0314   MCHC 32.4 10/21/2017 0314   RDW 14.6 10/21/2017 0314   LYMPHSABS 1.1 10/16/2017 1842   MONOABS 1.2 (H) 10/16/2017 1842   EOSABS 0.1 10/16/2017 1842   BASOSABS 0.1 10/16/2017 1842    BMET    Component Value Date/Time   NA 131 (L) 10/21/2017 0314   K 4.6 10/21/2017 0314   CL 103 10/21/2017 0314   CO2 22 10/21/2017 0314   GLUCOSE 341 (H) 10/21/2017 0314   BUN 13 10/21/2017 0314   CREATININE 1.00 10/21/2017 0314   CREATININE 0.66 06/07/2011 1645   CALCIUM 8.2 (L) 10/21/2017 0314   GFRNONAA >60 10/21/2017 0314   GFRAA >60 10/21/2017 0314    INR    Component Value Date/Time   INR 0.92 07/13/2012 1440     Intake/Output Summary (Last 24 hours) at 10/21/2017 16100812 Last data filed at 10/21/2017 0343 Gross per 24 hour  Intake 1561.7 ml  Output 960 ml  Net 601.7 ml     Assessment:  77 y.o. male is s/p:  Right great toe amputation  1 Day Post-Op  Plan: -will take down dressing tomorrow -pt has a +right peroneal doppler signal-findings from op note with adequate bleeding within the wound bed. Incision left open as unable to approximate.    Doreatha MassedSamantha Rhyne, PA-C [facility] [phone] 10/21/2017 8:12 AM   I have independently interviewed and examined patient and agree with PA assessment and plan above.   Shanea Karney C.  Randie Heinzain, MD [facility] [place] Office: [phone] Pager: [phone]

## 2017-10-21 NOTE — Progress Notes (Signed)
Serosanguinous drainage noted on the outside of the pt's bandage. Dressing reinforced. Will continue to monitor.

## 2017-10-21 NOTE — Progress Notes (Signed)
Serosanguinous drainage noted on the outside of the pt's bandage. Dressing reinforced. Will continue to monitor.  

## 2017-10-21 NOTE — Progress Notes (Signed)
Inpatient Diabetes Program Recommendations  AACE/ADA: New Consensus Statement on Inpatient Glycemic Control (2015)  Target Ranges:  Prepandial:   less than 140 mg/dL      Peak postprandial:   less than 180 mg/dL (1-2 hours)      Critically ill patients:  140 - 180 mg/dL   Lab Results  Component Value Date   GLUCAP 321 (H) 10/21/2017   HGBA1C 7.0 (H) 10/16/2017    Review of Glycemic Control Results for Retia PasseLUMPKIN, Jasson W (MRN [id number]) as of 10/21/2017 13:54  Ref. Range 10/20/2017 17:23 10/20/2017 21:23 10/21/2017 06:09 10/21/2017 11:43  Glucose-Capillary Latest Ref Range: 70 - 99 mg/dL 119166 (H) 147305 (H) 829263 (H) 321 (H)   Diabetes history: Type 2 DM Outpatient Diabetes medications: Glipizide 10 mg BID, Metformin 1000 mg BID Current orders for Inpatient glycemic control: Novolog 0-15 units TID  Inpatient Diabetes Program Recommendations:    Noted patient received Decadron 10 mg X 1 thus contributing to increased blood sugars. If FSBS continues to exceed 180 mg/dL, consider Levemir 6 units QD.  Also, could benefit from Novolog 0-5 units QHS. Anticipate, blood sugars will begin to trend down over the course of next 24 hours.   Thanks, Lujean RaveLauren Alexandria Current, MSN, RNC-OB Diabetes Coordinator [phone] (8a-5p)

## 2017-10-21 NOTE — Progress Notes (Signed)
Orthopedic Tech Progress Note Patient Details:  Retia PasseJohn W Cerullo 10-03-1940 [id number]  Ortho Devices Type of Ortho Device: Postop shoe/boot Ortho Device/Splint Location: rle Ortho Device/Splint Interventions: Application   Post Interventions Patient Tolerated: Well Instructions Provided: Care of device   Nikki DomCrawford, Denean Pavon 10/21/2017, 4:22 PM

## 2017-10-22 LAB — CBC WITH DIFFERENTIAL/PLATELET
Abs Immature Granulocytes: 0.1 10*3/uL (ref 0.0–0.1)
Basophils Absolute: 0.1 10*3/uL (ref 0.0–0.1)
Basophils Relative: 1 %
EOS ABS: 0.6 10*3/uL (ref 0.0–0.7)
EOS PCT: 5 %
HCT: 19.1 % — ABNORMAL LOW (ref 39.0–52.0)
HEMOGLOBIN: 6.2 g/dL — AB (ref 13.0–17.0)
Immature Granulocytes: 1 %
Lymphocytes Relative: 18 %
Lymphs Abs: 2.1 10*3/uL (ref 0.7–4.0)
MCH: 28.1 pg (ref 26.0–34.0)
MCHC: 32.5 g/dL (ref 30.0–36.0)
MCV: 86.4 fL (ref 78.0–100.0)
MONO ABS: 0.9 10*3/uL (ref 0.1–1.0)
MONOS PCT: 8 %
Neutro Abs: 8 10*3/uL — ABNORMAL HIGH (ref 1.7–7.7)
Neutrophils Relative %: 67 %
PLATELETS: 422 10*3/uL — AB (ref 150–400)
RBC: 2.21 MIL/uL — ABNORMAL LOW (ref 4.22–5.81)
RDW: 14.6 % (ref 11.5–15.5)
WBC: 11.7 10*3/uL — ABNORMAL HIGH (ref 4.0–10.5)

## 2017-10-22 LAB — GLUCOSE, CAPILLARY
GLUCOSE-CAPILLARY: 261 mg/dL — AB (ref 70–99)
Glucose-Capillary: 180 mg/dL — ABNORMAL HIGH (ref 70–99)
Glucose-Capillary: 225 mg/dL — ABNORMAL HIGH (ref 70–99)
Glucose-Capillary: 215 mg/dL — ABNORMAL HIGH (ref 70–99)

## 2017-10-22 LAB — OCCULT BLOOD X 1 CARD TO LAB, STOOL: Fecal Occult Bld: NEGATIVE

## 2017-10-22 LAB — PREPARE RBC (CROSSMATCH)

## 2017-10-22 LAB — HEMOGLOBIN AND HEMATOCRIT, BLOOD
HEMATOCRIT: 27.2 % — AB (ref 39.0–52.0)
Hemoglobin: 8.8 g/dL — ABNORMAL LOW (ref 13.0–17.0)

## 2017-10-22 MED ORDER — SODIUM CHLORIDE 0.9% IV SOLUTION
Freq: Once | INTRAVENOUS | Status: AC
  Administered 2017-10-22: 13:00:00 via INTRAVENOUS

## 2017-10-22 MED ORDER — SODIUM CHLORIDE 0.9% IV SOLUTION: Freq: Once | INTRAVENOUS | Status: DC

## 2017-10-22 NOTE — Progress Notes (Signed)
Physical Therapy Treatment Patient Details Name: Daniel Tyler W Buczynski MRN: [id number] DOB: 12-12-1940 Today's Date: 10/22/2017    History of Present Illness Patient is a 77 year old male with past medical history of peripheral vascular disease status post femoropopliteal bypass on 07/14/2012, coronary artery disease status post CABG in 2013, carotid endarterectomy, type 2 diabetes mellitus, hypertension, hyperlipidemia who presented here with worsening pain of the right toe.  Found to have gangrene of the right toe on presentation.  Vascular surgery consulted.  Underwent right lower extremity arteriogram 2 stents placement and also amputation of right gangrenous toe.    PT Comments    Limited session due to transfusion, pt demonstrating safety with functional transfers to commode and to chair. Pt is distractible and tangential so deferred further ambulation while transfusion running. Will plan to progress walking next PT visit.     Follow Up Recommendations  Home health PT;Supervision/Assistance - 24 hour     Equipment Recommendations  None recommended by PT    Recommendations for Other Services       Precautions / Restrictions Precautions Precautions: Fall Required Braces or Orthoses: Other Brace/Splint Restrictions Weight Bearing Restrictions: Yes    Mobility  Bed Mobility Overal bed mobility: Independent                Transfers Overall transfer level: Needs assistance Equipment used: Rolling walker (2 wheeled) Transfers: Sit to/from [employer];Stand Pivot Transfers Sit to Stand: Supervision         General transfer comment: pt with cues for hand placement on RW, able to stand without help. once standing head to use commode, patient transfered with supervision to commode and back to chair.   Ambulation/Gait                 Stairs             Wheelchair Mobility    Modified Rankin (Stroke Patients Only)       Balance Overall balance assessment:  Needs assistance Sitting-balance support: No upper extremity supported;Feet supported Sitting balance-Leahy Scale: Fair     Standing balance support: Bilateral upper extremity supported;During functional activity Standing balance-Leahy Scale: Poor Standing balance comment: relies on UE support                            Cognition Arousal/Alertness: Awake/alert Behavior During Therapy: WFL for tasks assessed/performed                                          Exercises      General Comments        Pertinent Vitals/Pain Pain Assessment: Faces Faces Pain Scale: Hurts a little bit Pain Location: right foot Pain Descriptors / Indicators: Aching;Grimacing;Guarding Pain Intervention(s): Limited activity within patient's tolerance;Monitored during session;Premedicated before session    Home Living                      Prior Function            PT Goals (current goals can now be found in the care plan section) Acute Rehab PT Goals Patient Stated Goal: to get home PT Goal Formulation: With patient Time For Goal Achievement: 11/04/17 Potential to Achieve Goals: Good Progress towards PT goals: Progressing toward goals    Frequency    Min 3X/week      PT  Plan Current plan remains appropriate    Co-evaluation              AM-PAC PT "6 Clicks" Daily Activity  Outcome Measure  Difficulty turning over in bed (including adjusting bedclothes, sheets and blankets)?: None Difficulty moving from lying on back to sitting on the side of the bed? : None Difficulty sitting down on and standing up from a chair with arms (e.g., wheelchair, bedside commode, etc,.)?: A Little Help needed moving to and from a bed to chair (including a wheelchair)?: A Little Help needed walking in hospital room?: A Little Help needed climbing 3-5 steps with a railing? : A Little 6 Click Score: 20    End of Session Equipment Utilized During Treatment: Gait  belt Activity Tolerance: Patient limited by fatigue Patient left: in chair;with call bell/phone within reach;with chair alarm set Nurse Communication: Mobility status PT Visit Diagnosis: Unsteadiness on feet (R26.81);Muscle weakness (generalized) (M62.81);Pain Pain - Right/Left: Right Pain - part of body: Ankle and joints of foot     Time: 1450-1510 PT Time Calculation (min) (ACUTE ONLY): 20 min  Charges:  $Therapeutic Activity: 8-22 mins                     Etta Grandchild, PT, DPT Acute Rehab Services Pager: [phone]     Etta Grandchild 10/22/2017, 3:47 PM

## 2017-10-22 NOTE — Discharge Instructions (Signed)
Wet to dry 2x2 gauze saline dressing to open wound followed by 4x4 gauze and put gauze between toes followed by kerlix and 4" ace wrap daily.   Heel weight bearing only with flat post op shoe.  Float heels off bed to help prevent pressure ulcer on heels.

## 2017-10-22 NOTE — Progress Notes (Addendum)
  Progress Note    10/22/2017 1:43 PM 2 Days Post-Op  Subjective:  No complaints  Afebrile HR 70's-80's NSR 100's-130's systolic 99% RA  Vitals:   10/22/17 1319 10/22/17 1323  BP: 108/64   Pulse: (!) 55 81  Resp: 18   Temp: 98.1 F (36.7 C)   SpO2: 99% 99%    Physical Exam: Cardiac:  regular Lungs:  Non labored Incisions:      CBC    Component Value Date/Time   WBC 11.7 (H) 10/22/2017 0514   RBC 2.21 (L) 10/22/2017 0514   HGB 6.2 (LL) 10/22/2017 0514   HCT 19.1 (L) 10/22/2017 0514   PLT 422 (H) 10/22/2017 0514   MCV 86.4 10/22/2017 0514   MCH 28.1 10/22/2017 0514   MCHC 32.5 10/22/2017 0514   RDW 14.6 10/22/2017 0514   LYMPHSABS 2.1 10/22/2017 0514   MONOABS 0.9 10/22/2017 0514   EOSABS 0.6 10/22/2017 0514   BASOSABS 0.1 10/22/2017 0514    BMET    Component Value Date/Time   NA 135 10/21/2017 0844   K 4.3 10/21/2017 0844   CL 104 10/21/2017 0844   CO2 22 10/21/2017 0844   GLUCOSE 200 (H) 10/21/2017 0844   BUN 11 10/21/2017 0844   CREATININE 1.07 10/21/2017 0844   CREATININE 0.66 06/07/2011 1645   CALCIUM 8.4 (L) 10/21/2017 0844   GFRNONAA >60 10/21/2017 0844   GFRAA >60 10/21/2017 0844    INR    Component Value Date/Time   INR 0.92 07/13/2012 1440     Intake/Output Summary (Last 24 hours) at 10/22/2017 1343 Last data filed at 10/22/2017 0930 Gross per 24 hour  Intake 1191.27 ml  Output 1600 ml  Net -408.73 ml     Assessment:  77 y.o. male is s/p:  Right great toe amputation  2 Days Post-Op  Plan: -dressing removed and wound looks good.  There was a mild continuous ooze from the wound so I held pressure for about 15 minutes and it stopped.  Wet to dry saline 2x2 on open wound and 4x4 gauze with kerlix and ace wrap re-applied. -heel weight bearing only in flat post op shoe.  Discussed with pt about floating heels off bed when he goes home to help prevent a pressure ulcer.  -receiving PRBC's this afternoon. -face to face placed for  [facility]   Doreatha MassedSamantha Rhyne, PA-C [facility] [phone] 10/22/2017 1:43 PM   I have independently interviewed and examined patient and agree with PA assessment and plan above.  Transfuse for acute blood loss anemia.  We will place wet-to-dry on the open portion of the wound.  He has a postoperative shoe for ambulation.  Brandon C. Randie Heinzain, MD [facility] [place] Office: [phone] Pager: [phone]

## 2017-10-22 NOTE — Progress Notes (Signed)
PROGRESS NOTE    Daniel Tyler  ZOX:[id number] DOB: 26-Oct-1940 DOA: 10/16/2017 PCP: Dois Davenport, MD   Brief Narrative: Patient is a 77 year old male with past medical history of peripheral vascular disease status post femoropopliteal bypass on 07/14/2012, coronary artery disease status post CABG in 2013, carotid endarterectomy, type 2 diabetes mellitus, hypertension, hyperlipidemia who presented here with worsening pain of the right toe.  Found to have gangrene of the right toe on presentation.  Vascular surgery consulted.  Underwent right lower extremity arteriogram 2 stents placement and also amputation of right gangrenous toe.  Hospital course remarkable for development  of acute blood loss anemia.  He is being transfused today.  Assessment & Plan:   Principal Problem:   Osteomyelitis of great toe of right foot (HCC) Active Problems:   Insulin dependent type 2 diabetes mellitus, controlled (HCC)   Hypertension   Atherosclerosis of native arteries of the extremities with ulceration (HCC)   Gangrene of toe of right foot (HCC)   Cellulitis of great toe of right foot   Malnutrition of moderate degree  Right toe osteomyelitis/peripheral vascular disease: Status post right lower extremity arteriogram with 2 stents placement on 10/19/2017 by vascular surgery.  Also underwent a right toe amputation which was found to be gangrenous.  Continue broad-spectrum antibiotics through  IV until he is discharged.  Currently he is on vancomycin and Zosyn.  Blood cultures have been negative so far.   Physical therapy recommended HHPT. He has a long history of peripheral vascular disease.  Follow-up with vascular surgery as an outpatient after discharge.  Anemia: His baseline hemoglobin is around 10.  Hemoglobin dropped to 6.2 today.  Likely associated with acute blood loss anemia secondary to surgery.  We will continue to monitor H&H.  He will be transfused today. He does not report any change in the  color of his stool.  Denies any abdominal pain. We will check FOBT to rule out GI bleed also.We will DC  Lovenox and put on SCDs for DVT prophylaxis.  Coronary artery disease: Status post CABG.  Continue metoprolol and statin.  Continue Plavix,aspirin,statin.  Hypertension/hyperlipidemia: Continue current medications.  Also on fenofibrate.  He is normotensive at present.  Diabetes mellitus: Continue sliding scale insulin here.  Patient is on glipizide and metformin at home.  DVT prophylaxis: SCD Code Status: Full Family Communication: None present at the bed otherwise appears comfortable Disposition Plan: Likely home in 1 to 2 days after her H&H stabilizes.  Awaiting vascular surgery clearance for discharge.   Consultants: Vascular surgery  Procedures: Right lower extremity arteriogram with stent placement, right toe amputation  Antimicrobials: Vancomycin and Zosyn started on 10/16/2017  Subjective: Patient seen this morning.  Remains comfortable.  Hemodynamically stable.  Did not report any change the color of his stool.  Denies any abdominal pain.  Objective: Vitals:   10/21/17 1946 10/22/17 0426 10/22/17 0846 10/22/17 1008  BP: (!) 114/58 (!) 107/53 (!) 119/52 138/65  Pulse: 96 82 79 95  Resp: 20 (!) 21    Temp: (!) 97.5 F (36.4 C) 98.1 F (36.7 C) 98 F (36.7 C)   TempSrc: Oral Oral Oral   SpO2: 94% 96%  98%  Weight:      Height:        Intake/Output Summary (Last 24 hours) at 10/22/2017 1103 Last data filed at 10/22/2017 0601 Gross per 24 hour  Intake 1191.27 ml  Output 1200 ml  Net -8.73 ml   [employer]  10/16/17 1814  Weight: 59 kg    Examination:  General exam: Appears calm and comfortable ,Not in distress,elderly gentleman HEENT:PERRL,Oral mucosa moist, Ear/Nose normal on gross exam Respiratory system: Bilateral equal air entry, normal vesicular breath sounds, no wheezes or crackles  Cardiovascular system: S1 & S2 heard, RRR. No JVD, murmurs, rubs,  gallops or clicks. No pedal edema. Gastrointestinal system: Abdomen is nondistended, soft and nontender. No organomegaly or masses felt. Normal bowel sounds heard. Central nervous system: Alert and oriented. No focal neurological deficits. Extremities: No edema, no clubbing ,no cyanosis, right foot covered with dressing. Skin: No rashes, lesions or ulcers,no icterus ,no pallor MSK: Normal muscle bulk,tone ,power Psychiatry: Judgement and insight appear normal. Mood & affect appropriate.     Data Reviewed: I have personally reviewed following labs and imaging studies  CBC: Recent Labs  Lab 10/16/17 1842 10/18/17 0520 10/20/17 0308 10/21/17 0314 10/22/17 0514  WBC 18.6* 12.5* 13.1* 14.7* 11.7*  NEUTROABS 16.1*  --   --   --  8.0*  HGB 10.7* 10.3* 9.3* 8.0* 6.2*  HCT 33.5* 32.0* 29.2* 24.7* 19.1*  MCV 88.6 87.9 87.4 87.0 86.4  PLT 519* 554* 519* 486* 422*   Basic Metabolic Panel: Recent Labs  Lab 10/18/17 0520 10/19/17 0324 10/20/17 0308 10/21/17 0314 10/21/17 0844  NA 132* 130* 131* 131* 135  K 5.3* 4.2 4.1 4.6 4.3  CL 104 101 102 103 104  CO2 20* 19* 21* 22 22  GLUCOSE 123* 167* 187* 341* 200*  BUN 11 7* 9 13 11   CREATININE 0.99 0.91 0.88 1.00 1.07  CALCIUM 8.4* 8.5* 8.4* 8.2* 8.4*  MG 1.9  --  1.8  --   --    GFR: Estimated Creatinine Clearance: 48.2 mL/min (by C-G formula based on SCr of 1.07 mg/dL). Liver Function Tests: No results for input(s): AST, ALT, ALKPHOS, BILITOT, PROT, ALBUMIN in the last 168 hours. No results for input(s): LIPASE, AMYLASE in the last 168 hours. No results for input(s): AMMONIA in the last 168 hours. Coagulation Profile: No results for input(s): INR, PROTIME in the last 168 hours. Cardiac Enzymes: No results for input(s): CKTOTAL, CKMB, CKMBINDEX, TROPONINI in the last 168 hours. BNP (last 3 results) No results for input(s): PROBNP in the last 8760 hours. HbA1C: No results for input(s): HGBA1C in the last 72 hours. CBG: Recent  Labs  Lab 10/21/17 1143 10/21/17 1618 10/21/17 2101 10/22/17 0632 10/22/17 1055  GLUCAP 321* 255* 261* 180* 261*   Lipid Profile: No results for input(s): CHOL, HDL, LDLCALC, TRIG, CHOLHDL, LDLDIRECT in the last 72 hours. Thyroid Function Tests: No results for input(s): TSH, T4TOTAL, FREET4, T3FREE, THYROIDAB in the last 72 hours. Anemia Panel: No results for input(s): VITAMINB12, FOLATE, FERRITIN, TIBC, IRON, RETICCTPCT in the last 72 hours. Sepsis Labs: Recent Labs  Lab 10/16/17 1849 10/16/17 2157  LATICACIDVEN 1.96* 2.17*    Recent Results (from the past 240 hour(s))  Blood culture (routine x 2)     Status: None   Collection Time: 10/16/17  9:50 PM  Result Value Ref Range Status   Specimen Description BLOOD RIGHT HAND  Final   Special Requests   Final    BOTTLES DRAWN AEROBIC AND ANAEROBIC Blood Culture adequate volume   Culture   Final    NO GROWTH 5 DAYS Performed at [facility], [address] [place], [place] [address]    Report Status 10/21/2017 FINAL  Final  Blood culture (routine x 2)  Status: None   Collection Time: 10/16/17  9:55 PM  Result Value Ref Range Status   Specimen Description BLOOD LEFT ARM  Final   Special Requests   Final    BOTTLES DRAWN AEROBIC AND ANAEROBIC Blood Culture adequate volume   Culture   Final    NO GROWTH 5 DAYS Performed at [facility], [address] [place], [place] [address]    Report Status 10/21/2017 FINAL  Final         Radiology Studies: No results found.      Scheduled Meds: . sodium chloride   Intravenous Once  . sodium chloride   Intravenous Once  . aspirin EC  81 mg Oral Daily  . clopidogrel  75 mg Oral Q breakfast  . enoxaparin (LOVENOX) injection  40 mg Subcutaneous Q24H  . feeding supplement (ENSURE ENLIVE)  237 mL Oral BID BM  . fenofibrate  160 mg Oral Daily  . insulin aspart  0-15 Units Subcutaneous TID WC  . metoprolol tartrate  12.5 mg Oral BID  . multivitamin with  minerals  1 tablet Oral Daily  . simvastatin  40 mg Oral QHS  . sodium chloride flush  3 mL Intravenous Q12H   Continuous Infusions: . sodium chloride 100 mL/hr (10/21/17 1343)  . sodium chloride    . sodium chloride 10 mL/hr at 10/20/17 1404  . piperacillin-tazobactam (ZOSYN)  IV 3.375 g (10/22/17 0630)  . vancomycin 1,250 mg (10/21/17 2331)     LOS: 6 days    Time spent:25 mins. More than 50% of that time was spent in counseling and/or coordination of care.      Burnadette PopAmrit Monifah Freehling, MD [facility] Pager [phone]  If 7PM-7AM, please contact night-coverage [email or web address] Password [email or web address] 10/22/2017, 11:03 AM

## 2017-10-22 NOTE — Care Management Note (Addendum)
Case Management Note Donn PieriniKristi Kourtland Coopman RN, BSN Unit 4E- RN Care Coordinator  [phone]  Patient Details  Name: Daniel PasseJohn W Mayer MRN: [id number] Date of Birth: 1940-12-06  Subjective/Objective:   Pt admitted with osteomyelitis of great toe,  S/p extremity arteriogram with 2 stents placed and also amputation of right gangrenous toe                Action/Plan: PTA Pt lived at home alone, spoke with pt at bedside regarding transition of care needs- per pt he has a friend Joe who lives across the street that will assist him on discharge- pt is adiment about not going to a SNF and wants to return home. [facility], states Joe will provide transportation home. Discussed DME needs- pt states he has a RW at home- denies any other DME Needs. [facility]- pt would benefit from HHRN/PT- will need orders placed with [facility] for discharge- [facility]- pt's address, phone # and PCP confirmed in epic with pt. Pt denies any barriers to getting [facility]   Expected Discharge Date:                  Expected Discharge Plan:  Home w Home Health Services  In-House Referral:     Discharge planning Services  CM Consult  Post Acute Care Choice:  Home Health Choice offered to:  Patient  DME Arranged:    DME Agency:     HH Arranged:   RN/PT HH Agency:  [facility]  Status of Service:  In process, will continue to follow  If discussed at Long Length of Stay Meetings, dates discussed:    Discharge Disposition: home/home health   Additional Comments:  Darrold SpanWebster, Daryan Cagley Hall, RN 10/22/2017, 11:54 AM

## 2017-10-23 ENCOUNTER — Telehealth: Payer: Self-pay | Admitting: Vascular Surgery

## 2017-10-23 LAB — CBC WITH DIFFERENTIAL/PLATELET
Abs Immature Granulocytes: 0.2 10*3/uL — ABNORMAL HIGH (ref 0.0–0.1)
Basophils Absolute: 0.1 10*3/uL (ref 0.0–0.1)
Basophils Relative: 0 %
Eosinophils Absolute: 0.8 10*3/uL — ABNORMAL HIGH (ref 0.0–0.7)
Eosinophils Relative: 6 %
HEMATOCRIT: 23.7 % — AB (ref 39.0–52.0)
HEMOGLOBIN: 7.7 g/dL — AB (ref 13.0–17.0)
IMMATURE GRANULOCYTES: 1 %
LYMPHS ABS: 2.2 10*3/uL (ref 0.7–4.0)
LYMPHS PCT: 16 %
MCH: 28.2 pg (ref 26.0–34.0)
MCHC: 32.5 g/dL (ref 30.0–36.0)
MCV: 86.8 fL (ref 78.0–100.0)
MONOS PCT: 7 %
Monocytes Absolute: 1 10*3/uL (ref 0.1–1.0)
NEUTROS PCT: 70 %
Neutro Abs: 9.2 10*3/uL — ABNORMAL HIGH (ref 1.7–7.7)
Platelets: 496 10*3/uL — ABNORMAL HIGH (ref 150–400)
RBC: 2.73 MIL/uL — AB (ref 4.22–5.81)
RDW: 14.6 % (ref 11.5–15.5)
WBC: 13.3 10*3/uL — AB (ref 4.0–10.5)

## 2017-10-23 LAB — BASIC METABOLIC PANEL
ANION GAP: 6 (ref 5–15)
BUN: 8 mg/dL (ref 8–23)
CO2: 22 mmol/L (ref 22–32)
Calcium: 8 mg/dL — ABNORMAL LOW (ref 8.9–10.3)
Chloride: 108 mmol/L (ref 98–111)
Creatinine, Ser: 0.83 mg/dL (ref 0.61–1.24)
GFR calc Af Amer: >60 mL/min (ref >60)
GFR calc non Af Amer: >60 mL/min (ref >60)
GLUCOSE: 182 mg/dL — AB (ref 70–99)
POTASSIUM: 4.2 mmol/L (ref 3.5–5.1)
Sodium: 136 mmol/L (ref 135–145)

## 2017-10-23 LAB — GLUCOSE, CAPILLARY
GLUCOSE-CAPILLARY: 93 mg/dL (ref 70–99)
Glucose-Capillary: 158 mg/dL — ABNORMAL HIGH (ref 70–99)
Glucose-Capillary: 319 mg/dL — ABNORMAL HIGH (ref 70–99)
Glucose-Capillary: 316 mg/dL — ABNORMAL HIGH (ref 70–99)

## 2017-10-23 MED ORDER — SIMVASTATIN 40 MG PO TABS: 40.0000 mg | ORAL_TABLET | Freq: Every day | ORAL | 0 refills | Status: DC

## 2017-10-23 MED ORDER — HYDROCODONE-ACETAMINOPHEN 5-325 MG PO TABS: 1.0000 | ORAL_TABLET | Freq: Four times a day (QID) | ORAL | 0 refills | Status: DC | PRN

## 2017-10-23 MED ORDER — CLOPIDOGREL BISULFATE 75 MG PO TABS: 75.0000 mg | ORAL_TABLET | Freq: Every day | ORAL | 0 refills | Status: DC

## 2017-10-23 MED ORDER — AMOXICILLIN-POT CLAVULANATE 875-125 MG PO TABS: 1.0000 | ORAL_TABLET | Freq: Two times a day (BID) | ORAL | 0 refills | Status: DC

## 2017-10-23 NOTE — Progress Notes (Signed)
Physical Therapy Treatment Patient Details Name: Daniel Tyler MRN: [id number] DOB: 08/09/1940 Today's Date: 10/23/2017    History of Present Illness Patient is a 77 year old male with past medical history of peripheral vascular disease status post femoropopliteal bypass on 07/14/2012, coronary artery disease status post CABG in 2013, carotid endarterectomy, type 2 diabetes mellitus, hypertension, hyperlipidemia who presented here with worsening pain of the right toe.  Found to have gangrene of the right toe on presentation.  Vascular surgery consulted.  Underwent right lower extremity arteriogram 2 stents placement and also amputation of right gangrenous toe.    PT Comments    Pt making steady progress with mobility. Has refused SNF and will have HHPT for follow up.     Follow Up Recommendations  Home health PT;Supervision for mobility/OOB     Equipment Recommendations  None recommended by PT    Recommendations for Other Services       Precautions / Restrictions Precautions Precautions: Fall Required Braces or Orthoses: Other Brace/Splint Other Brace/Splint: Post op shoe on rt  Restrictions Weight Bearing Restrictions: Yes Other Position/Activity Restrictions: heel weight bearing on rt    Mobility  Bed Mobility Overal bed mobility: Independent                Transfers Overall transfer level: Needs assistance Equipment used: Rolling walker (2 wheeled) Transfers: Sit to/from Stand Sit to Stand: Supervision         General transfer comment: verbal cues for hand placement  Ambulation/Gait Ambulation/Gait assistance: Supervision Gait Distance (Feet): 50 Feet Assistive device: Rolling walker (2 wheeled) Gait Pattern/deviations: Step-through pattern;Decreased stride length;Trunk flexed Gait velocity: decr Gait velocity interpretation: 1.31 - 2.62 ft/sec, indicative of limited community ambulator General Gait Details: supervision for safety. Verbal cues to remind  of heel weight bearing   Stairs             Wheelchair Mobility    Modified Rankin (Stroke Patients Only)       Balance Overall balance assessment: Needs assistance Sitting-balance support: No upper extremity supported;Feet supported Sitting balance-Leahy Scale: Good     Standing balance support: Bilateral upper extremity supported;During functional activity Standing balance-Leahy Scale: Poor Standing balance comment: walker and supervision                            Cognition Arousal/Alertness: Awake/alert Behavior During Therapy: WFL for tasks assessed/performed Overall Cognitive Status: No family/caregiver present to determine baseline cognitive functioning                                        Exercises      General Comments        Pertinent Vitals/Pain Pain Assessment: Faces Faces Pain Scale: Hurts a little bit Pain Location: right foot Pain Descriptors / Indicators: Aching;Grimacing;Guarding Pain Intervention(s): Limited activity within patient's tolerance    Home Living                      Prior Function            PT Goals (current goals can now be found in the care plan section) Progress towards PT goals: Progressing toward goals    Frequency    Min 3X/week      PT Plan Current plan remains appropriate    Co-evaluation  AM-PAC PT "6 Clicks" Daily Activity  Outcome Measure  Difficulty turning over in bed (including adjusting bedclothes, sheets and blankets)?: None Difficulty moving from lying on back to sitting on the side of the bed? : None Difficulty sitting down on and standing up from a chair with arms (e.g., wheelchair, bedside commode, etc,.)?: A Little Help needed moving to and from a bed to chair (including a wheelchair)?: A Little Help needed walking in hospital room?: A Little Help needed climbing 3-5 steps with a railing? : A Little 6 Click Score: 20    End of  Session Equipment Utilized During Treatment: Gait belt Activity Tolerance: Patient limited by fatigue Patient left: with call bell/phone within reach;in bed;with bed alarm set Nurse Communication: Mobility status PT Visit Diagnosis: Unsteadiness on feet (R26.81);Muscle weakness (generalized) (M62.81);Pain Pain - Right/Left: Right Pain - part of body: Ankle and joints of foot     Time: 1720-1735 PT Time Calculation (min) (ACUTE ONLY): 15 min  Charges:  $Gait Training: 8-22 mins                     [facility] Cranston Tyler PT Acute Rehabilitation Services Pager [phone] Office [phone]    Daniel Tyler 10/23/2017, 5:48 PM

## 2017-10-23 NOTE — Discharge Summary (Addendum)
Physician Discharge Summary  Daniel Tyler WUJ:[id number] DOB: Aug 06, 1940 DOA: 10/16/2017  PCP: Dois Davenport, MD  Admit date: 10/16/2017 Discharge date: 10/25/17 Admitted From: Home Disposition:  SNF  Discharge Condition:Stable CODE STATUS:FULL Diet recommendation: Heart Healthy   Brief/Interim Summary:  Patient is a 77 year old male with past medical history of peripheral vascular disease status post femoropopliteal bypass on 07/14/2012, coronary artery disease status post CABG in 2013, carotid endarterectomy, type 2 diabetes mellitus, hypertension, hyperlipidemia who presented here with worsening pain of the right toe.  Found to have gangrene of the right toe on presentation.  Vascular surgery consulted.  Underwent right lower extremity arteriogram 2 stents placement and also amputation of right gangrenous toe.  Hospital course was remarkable for development  of acute blood loss anemia.  He was  transfused with PRBC.  This morning he is hemodynamically stable.  Hemoglobin is stable.  Patient is stable for discharge to SNF today.  He will be followed by vascular surgery as an outpatient.  Following problems were addressed during his hospitalization:   Right toe osteomyelitis/peripheral vascular disease: Status post right lower extremity arteriogram with 2 stents placement on 10/19/2017 by vascular surgery.  Also underwent a right toe amputation which was found to be gangrenous.  Continue oral antibiotics on discharge.  Blood cultures have been negative so far.   Physical therapy recommended HHPT. He has a long history of peripheral vascular disease.  Follow-up with vascular surgery as an outpatient after discharge. He needs to continue dressing of his right foot.  Anemia: His baseline hemoglobin is around 10.  Hemoglobin dropped to 6.2 and he was transfused with 1 unit of PRBC.  Likely associated with acute blood loss anemia secondary to surgery. He does not report any change in the color of  his stool.  Denies any abdominal pain. FOBT negative.  Coronary artery disease: Status post CABG.  Continue metoprolol and statin.  Continue Plavix,aspirin,statin.  Hypertension/hyperlipidemia: Continue current medications.  Also on fenofibrate.  He is normotensive at present.  Diabetes mellitus: Continue sliding scale insulin here.  Patient is on glipizide and metformin at home  Discharge Diagnoses:  Principal Problem:   Osteomyelitis of great toe of right foot (HCC) Active Problems:   Insulin dependent type 2 diabetes mellitus, controlled (HCC)   Hypertension   Atherosclerosis of native arteries of the extremities with ulceration (HCC)   Gangrene of toe of right foot (HCC)   Cellulitis of great toe of right foot   Malnutrition of moderate degree    Discharge Instructions  Discharge Instructions    Diet - low sodium heart healthy   Complete by:  As directed    Discharge instructions   Complete by:  As directed    1) Please follow up with your PCP in a week.  Do a CBC and BMP tests during the follow-up . 2) Follow up vascular surgery as an outpatient. 3)Take prescribed medications as instructed.   Increase activity slowly   Complete by:  As directed      Allergies as of 10/23/2017   No Known Allergies     Medication List    TAKE these medications   acetaminophen 500 MG tablet Commonly known as:  TYLENOL Take 1,000-1,500 mg by mouth daily as needed for mild pain.   amoxicillin-clavulanate 875-125 MG tablet Commonly known as:  AUGMENTIN Take 1 tablet by mouth 2 (two) times daily for 7 days.   aspirin EC 81 MG tablet Take 81 mg by mouth daily.  clopidogrel 75 MG tablet Commonly known as:  PLAVIX Take 1 tablet (75 mg total) by mouth daily with breakfast. Start taking on:  10/24/2017   fenofibrate 160 MG tablet Take 160 mg by mouth daily.   glipiZIDE 10 MG tablet Commonly known as:  GLUCOTROL Take 10 mg by mouth 2 (two) times daily before a meal.    HYDROcodone-acetaminophen 5-325 MG tablet Commonly known as:  NORCO/VICODIN Take 1 tablet by mouth every 6 (six) hours as needed for moderate pain.   magnesium oxide 400 (241.3 Mg) MG tablet Commonly known as:  MAG-OX Take 1 tablet (400 mg total) by mouth 2 (two) times daily.   metFORMIN 1000 MG tablet Commonly known as:  GLUCOPHAGE TAKE ONE TABLET BY MOUTH TWICE DAILY FOR  DIABETES What changed:  See the new instructions.   metoprolol tartrate 25 MG tablet Commonly known as:  LOPRESSOR Take 12.5 mg by mouth 2 (two) times daily.   simvastatin 40 MG tablet Commonly known as:  ZOCOR Take 1 tablet (40 mg total) by mouth at bedtime. What changed:  Another medication with the same name was removed. Continue taking this medication, and follow the directions you see here.      Follow-up Information    Maeola Harmanain, Brandon Christopher, MD In 3 weeks.   Specialties:  Vascular Surgery, Cardiology Why:  Office will call you to arrange your appt (sent) Contact information: [address] [place] [address] [phone]        [facility]-Home Follow up.   Specialty:  Home Health Services Why:  HHRN/PT arranged- they will call you to set up home visits Contact information: [address] [place] [address] [phone]          No Known Allergies  Consultations: Vascular surgery  Procedures/Studies: Dg Foot Complete Right  Result Date: 10/16/2017 CLINICAL DATA:  Right great toe gangrene. EXAM: RIGHT FOOT COMPLETE - 3+ VIEW COMPARISON:  None. FINDINGS: Prior amputation of the fourth toe. Suspected gas in the webspace between the first and second digits. Potential cortical demineralization along the midshaft of the proximal phalanx great toe. No overt bony destructive findings. Abnormal soft tissue swelling medial to the first MTP joint. There is also diffuse subcutaneous edema. No malalignment at the Lisfranc joint. Fast flow calcifications are observed.  There is dorsal subcutaneous edema along the forefoot. IMPRESSION: 1. Abnormal gas along the webspace between the first and second toes, suspicious for soft tissue infection. 2. There is some cortical demineralization in the midshaft of the proximal phalanx great toe which may be a sign of early osteomyelitis. 3. Soft tissue swelling medial to the first MTP joint. 4. Prior amputation of the fourth toe. 5. Dorsal subcutaneous edema in the foot. Electronically Signed   By: Gaylyn RongWalter  Liebkemann [name]   On: 10/16/2017 20:14       Subjective:  Patient seen and examined the bedside this morning.  Remains comfortable.  Hemodynamically stable.  Stable for discharge to home.   Discharge Exam: Vitals:   10/22/17 1939 10/23/17 0445  BP: (!) 143/62 137/80  Pulse: 89 79  Resp:  20  Temp: 98.2 F (36.8 C) 98.4 F (36.9 C)  SpO2: 99% 95%   Vitals:   10/22/17 1343 10/22/17 1556 10/22/17 1939 10/23/17 0445  BP: (!) 102/51 (!) 146/66 (!) 143/62 137/80  Pulse: 82 81 89 79  Resp: 18 18  20   Temp: 97.7 F (36.5 C) 98.2 F (36.8 C) 98.2 F (36.8 C) 98.4 F (  36.9 C)  TempSrc: Oral Oral Oral Oral  SpO2:  99% 99% 95%  Weight:      Height:        General: Pt is alert, awake, not in acute distress Cardiovascular: RRR, S1/S2 +, no rubs, no gallops Respiratory: CTA bilaterally, no wheezing, no rhonchi Abdominal: Soft, NT, ND, bowel sounds + Extremities: no edema, no cyanosis, right foot wrapped with dressings    The results of significant diagnostics from this hospitalization (including imaging, microbiology, ancillary and laboratory) are listed below for reference.     Microbiology: Recent Results (from the past 240 hour(s))  Blood culture (routine x 2)     Status: None   Collection Time: 10/16/17  9:50 PM  Result Value Ref Range Status   Specimen Description BLOOD RIGHT HAND  Final   Special Requests   Final    BOTTLES DRAWN AEROBIC AND ANAEROBIC Blood Culture adequate volume   Culture    Final    NO GROWTH 5 DAYS Performed at [facility], [address] [place], [place] [address]    Report Status 10/21/2017 FINAL  Final  Blood culture (routine x 2)     Status: None   Collection Time: 10/16/17  9:55 PM  Result Value Ref Range Status   Specimen Description BLOOD LEFT ARM  Final   Special Requests   Final    BOTTLES DRAWN AEROBIC AND ANAEROBIC Blood Culture adequate volume   Culture   Final    NO GROWTH 5 DAYS Performed at [facility], [address], [place], [place] [address]    Report Status 10/21/2017 FINAL  Final     Labs: BNP (last 3 results) No results for input(s): BNP in the last 8760 hours. Basic Metabolic Panel: Recent Labs  Lab 10/18/17 0520 10/19/17 0324 10/20/17 0308 10/21/17 0314 10/21/17 0844 10/23/17 0314  NA 132* 130* 131* 131* 135 136  K 5.3* 4.2 4.1 4.6 4.3 4.2  CL 104 101 102 103 104 108  CO2 20* 19* 21* 22 22 22   GLUCOSE 123* 167* 187* 341* 200* 182*  BUN 11 7* 9 13 11 8   CREATININE 0.99 0.91 0.88 1.00 1.07 0.83  CALCIUM 8.4* 8.5* 8.4* 8.2* 8.4* 8.0*  MG 1.9  --  1.8  --   --   --    Liver Function Tests: No results for input(s): AST, ALT, ALKPHOS, BILITOT, PROT, ALBUMIN in the last 168 hours. No results for input(s): LIPASE, AMYLASE in the last 168 hours. No results for input(s): AMMONIA in the last 168 hours. CBC: Recent Labs  Lab 10/16/17 1842 10/18/17 0520 10/20/17 0308 10/21/17 0314 10/22/17 0514 10/22/17 1808 10/23/17 0314  WBC 18.6* 12.5* 13.1* 14.7* 11.7*  --  13.3*  NEUTROABS 16.1*  --   --   --  8.0*  --  9.2*  HGB 10.7* 10.3* 9.3* 8.0* 6.2* 8.8* 7.7*  HCT 33.5* 32.0* 29.2* 24.7* 19.1* 27.2* 23.7*  MCV 88.6 87.9 87.4 87.0 86.4  --  86.8  PLT 519* 554* 519* 486* 422*  --  496*   Cardiac Enzymes: No results for input(s): CKTOTAL, CKMB, CKMBINDEX, TROPONINI in the last 168 hours. BNP: Invalid input(s): POCBNP CBG: Recent Labs  Lab 10/22/17 1055 10/22/17 1601 10/22/17 2138 10/23/17 0632  10/23/17 1109  GLUCAP 261* 225* 215* 158* 319*   D-Dimer No results for input(s): DDIMER in the last 72 hours. Hgb A1c No results for input(s): HGBA1C in the last 72 hours. Lipid Profile  No results for input(s): CHOL, HDL, LDLCALC, TRIG, CHOLHDL, LDLDIRECT in the last 72 hours. Thyroid function studies No results for input(s): TSH, T4TOTAL, T3FREE, THYROIDAB in the last 72 hours.  Invalid input(s): FREET3 Anemia work up No results for input(s): VITAMINB12, FOLATE, FERRITIN, TIBC, IRON, RETICCTPCT in the last 72 hours. Urinalysis    Component Value Date/Time   COLORURINE YELLOW 07/20/2015 2230   APPEARANCEUR CLEAR 07/20/2015 2230   LABSPEC 1.012 07/20/2015 2230   PHURINE 5.0 07/20/2015 2230   GLUCOSEU NEGATIVE 07/20/2015 2230   HGBUR NEGATIVE 07/20/2015 2230   BILIRUBINUR NEGATIVE 07/20/2015 2230   KETONESUR NEGATIVE 07/20/2015 2230   PROTEINUR NEGATIVE 07/20/2015 2230   UROBILINOGEN 1.0 07/13/2012 1445   NITRITE NEGATIVE 07/20/2015 2230   LEUKOCYTESUR NEGATIVE 07/20/2015 2230   Sepsis Labs Invalid input(s): PROCALCITONIN,  WBC,  LACTICIDVEN Microbiology Recent Results (from the past 240 hour(s))  Blood culture (routine x 2)     Status: None   Collection Time: 10/16/17  9:50 PM  Result Value Ref Range Status   Specimen Description BLOOD RIGHT HAND  Final   Special Requests   Final    BOTTLES DRAWN AEROBIC AND ANAEROBIC Blood Culture adequate volume   Culture   Final    NO GROWTH 5 DAYS Performed at [facility], [address], [place], [place] [address]    Report Status 10/21/2017 FINAL  Final  Blood culture (routine x 2)     Status: None   Collection Time: 10/16/17  9:55 PM  Result Value Ref Range Status   Specimen Description BLOOD LEFT ARM  Final   Special Requests   Final    BOTTLES DRAWN AEROBIC AND ANAEROBIC Blood Culture adequate volume   Culture   Final    NO GROWTH 5 DAYS Performed at [facility], [address] [place], [place]  [address]    Report Status 10/21/2017 FINAL  Final    Please note: You were cared for by a hospitalist during your hospital stay. Once you are discharged, your primary care physician will handle any further medical issues. Please note that NO REFILLS for any discharge medications will be authorized once you are discharged, as it is imperative that you return to your primary care physician (or establish a relationship with a primary care physician if you do not have one) for your post hospital discharge needs so that they can reassess your need for medications and monitor your lab values.    Time coordinating discharge: 40 minutes  SIGNED:   Burnadette Pop, MD  [facility] 10/23/2017, 12:24 PM Pager [phone]  If 7PM-7AM, please contact night-coverage [email or web address] Password [email or web address]

## 2017-10-23 NOTE — Progress Notes (Signed)
  Progress Note    10/23/2017 8:38 AM 3 Days Post-Op  Patient really wants to go home  Vitals:   10/22/17 1939 10/23/17 0445  BP: (!) 143/62 137/80  Pulse: 89 79  Resp:  20  Temp: 98.2 F (36.8 C) 98.4 F (36.9 C)  SpO2: 99% 95%    Physical Exam: Awake alert oriented Left groin is soft without hematoma Right foot dressing clean dry and intact there is a strong peroneal signal at the ankle  CBC    Component Value Date/Time   WBC 13.3 (H) 10/23/2017 0314   RBC 2.73 (L) 10/23/2017 0314   HGB 7.7 (L) 10/23/2017 0314   HCT 23.7 (L) 10/23/2017 0314   PLT 496 (H) 10/23/2017 0314   MCV 86.8 10/23/2017 0314   MCH 28.2 10/23/2017 0314   MCHC 32.5 10/23/2017 0314   RDW 14.6 10/23/2017 0314   LYMPHSABS 2.2 10/23/2017 0314   MONOABS 1.0 10/23/2017 0314   EOSABS 0.8 (H) 10/23/2017 0314   BASOSABS 0.1 10/23/2017 0314    BMET    Component Value Date/Time   NA 136 10/23/2017 0314   K 4.2 10/23/2017 0314   CL 108 10/23/2017 0314   CO2 22 10/23/2017 0314   GLUCOSE 182 (H) 10/23/2017 0314   BUN 8 10/23/2017 0314   CREATININE 0.83 10/23/2017 0314   CREATININE 0.66 06/07/2011 1645   CALCIUM 8.0 (L) 10/23/2017 0314   GFRNONAA >60 10/23/2017 0314   GFRAA >60 10/23/2017 0314    INR    Component Value Date/Time   INR 0.92 07/13/2012 1440     Intake/Output Summary (Last 24 hours) at 10/23/2017 0838 Last data filed at 10/23/2017 0500 Gross per 24 hour  Intake 1765 ml  Output 4200 ml  Net -2435 ml     Assessment:  77 y.o. male is status post right lower extremity revascularization now on aspirin and Plavix.  He was transfused yesterday for acute blood loss anemia.  Hematocrit is stable today posttransfusion.  Plan: Out of bed with postop shoe Continue aspirin Plavix Daily dressing changes Is okay for discharge home from vascular standpoint.   Brandon C. Randie Heinzain, MD [facility] [place] Office: [phone] Pager:  [phone]  10/23/2017 8:38 AM

## 2017-10-23 NOTE — Care Management Important Message (Signed)
Important Message  Patient Details  Name: Daniel Tyler MRN: [id number] Date of Birth: [birth year]   Medicare Important Message Given:  Yes    Trisha Morandi P Elisa Sorlie 10/23/2017, 4:20 PM

## 2017-10-23 NOTE — Progress Notes (Signed)
Inpatient Diabetes Program Recommendations  AACE/ADA: New Consensus Statement on Inpatient Glycemic Control (2015)  Target Ranges:  Prepandial:   less than 140 mg/dL      Peak postprandial:   less than 180 mg/dL (1-2 hours)      Critically ill patients:  140 - 180 mg/dL   Lab Results  Component Value Date   GLUCAP 319 (H) 10/23/2017   HGBA1C 7.0 (H) 10/16/2017    Review of Glycemic Control Results for Retia PasseLUMPKIN, Mikaele W (MRN [id number]) as of 10/23/2017 11:38  Ref. Range 10/22/2017 16:01 10/22/2017 21:38 10/23/2017 06:32 10/23/2017 11:09  Glucose-Capillary Latest Ref Range: 70 - 99 mg/dL 409225 (H) 811215 (H) 914158 (H) 319 (H)   Diabetes history: Type 2 DM Outpatient Diabetes medications: Glipizide 10 mg BID, Metformin 1000 mg BID Current orders for Inpatient glycemic control: Novolog 0-15 units TID  Inpatient Diabetes Program Recommendations:    If to remain inpatient, consider adding Novolog 0-5 units QHS and Novolog 3 units TID (assuming that patient consumes >50% of meal).  Thanks, Lujean RaveLauren Issai Werling, MSN, RNC-OB Diabetes Coordinator [phone] (8a-5p)

## 2017-10-23 NOTE — Telephone Encounter (Signed)
sch appt lvm mld ltr 11/13/17 1245pm suture removal

## 2017-10-24 LAB — CBC
HCT: 24.5 % — ABNORMAL LOW (ref 39.0–52.0)
Hemoglobin: 8 g/dL — ABNORMAL LOW (ref 13.0–17.0)
MCH: 29 pg (ref 26.0–34.0)
MCHC: 32.7 g/dL (ref 30.0–36.0)
MCV: 88.8 fL (ref 78.0–100.0)
PLATELETS: 498 10*3/uL — AB (ref 150–400)
RBC: 2.76 MIL/uL — ABNORMAL LOW (ref 4.22–5.81)
RDW: 15.3 % (ref 11.5–15.5)
WBC: 11.8 10*3/uL — AB (ref 4.0–10.5)

## 2017-10-24 LAB — GLUCOSE, CAPILLARY
GLUCOSE-CAPILLARY: 139 mg/dL — AB (ref 70–99)
GLUCOSE-CAPILLARY: 281 mg/dL — AB (ref 70–99)
Glucose-Capillary: 183 mg/dL — ABNORMAL HIGH (ref 70–99)
Glucose-Capillary: 330 mg/dL — ABNORMAL HIGH (ref 70–99)

## 2017-10-24 MED ORDER — AMOXICILLIN-POT CLAVULANATE 875-125 MG PO TABS
1.0000 | ORAL_TABLET | Freq: Two times a day (BID) | ORAL | Status: DC
  Administered 2017-10-24 – 2017-10-25 (×3): 1 via ORAL
  Filled 2017-10-24 (×3): qty 1

## 2017-10-24 MED ORDER — AMOXICILLIN-POT CLAVULANATE 875-125 MG PO TABS: 1.0000 | ORAL_TABLET | Freq: Two times a day (BID) | ORAL | Status: DC

## 2017-10-24 NOTE — Progress Notes (Signed)
PT Cancellation Note  Patient Details Name: Daniel Tyler MRN: [id number] DOB: [birth year]   Cancelled Treatment:    Reason Eval/Treat Not Completed: Fatigue/lethargy limiting ability to participate.  Attempted x 2 to see patient.  1st attempt, pt declined stating he didn't feel good.  2nd attempt, patient was asleep and unable to easily arouse.    Daniel Tyler, Daniel Tyler 10/24/2017, 2:31 PM

## 2017-10-24 NOTE — Progress Notes (Signed)
CSW received consult for SNF. CSW will start workup and can start insurance authorization on Monday if insurance is open on Labor Day.   Osborne Cascoadia Jette Lewan LCSW [phone]

## 2017-10-24 NOTE — Progress Notes (Signed)
[facility]  Subjective  - Comfortable.   Objective (!) 143/78 89 98.6 F (37 C) (Oral) 18 97%  Intake/Output Summary (Last 24 hours) at 10/24/2017 0804 Last data filed at 10/24/2017 0600 Gross per 24 hour  Intake 380 ml  Output 2425 ml  Net -2045 ml   Right foot dressing clean and dry Doppler peroneal signal right LE    Assessment/Planning: POD # 4 right GT amputation  Patient is being discharged today F/U scheduled with Dr. Randie Heinzain in 2-3 weeks. Heel weight bearing hard sole shoe.  Daniel Tyler Daniel Tyler 10/24/2017 8:04 AM --  Laboratory Lab Results: Recent Labs    10/22/17 0514 10/22/17 1808 10/23/17 0314  WBC 11.7*  --  13.3*  HGB 6.2* 8.8* 7.7*  HCT 19.1* 27.2* 23.7*  PLT 422*  --  496*   BMET Recent Labs    10/21/17 0844 10/23/17 0314  NA 135 136  K 4.3 4.2  CL 104 108  CO2 22 22  GLUCOSE 200* 182*  BUN 11 8  CREATININE 1.07 0.83  CALCIUM 8.4* 8.0*    COAG Lab Results  Component Value Date   INR 0.92 07/13/2012   INR 1.42 07/01/2011   INR 1.00 06/27/2011   No results found for: PTT

## 2017-10-24 NOTE — NC FL2 (Signed)
[place] MEDICAID FL2 LEVEL OF CARE SCREENING TOOL     IDENTIFICATION  Patient Name: Daniel Tyler Birthdate: January 26, 1941 Sex: male Admission Date (Current Location): 10/16/2017  [facility] [place]   Facility and Address:  [facility], [address], [place], [place] [address]      Provider Number: [id number]  Attending Physician Name and Address:  Burnadette PopAdhikari, Amrit, MD  Relative Name and Phone Number:       Current Level of Care: Hospital Recommended Level of Care: Skilled Nursing Facility Prior Approval Number:    Date Approved/Denied:   PASRR Number: [id number]  Discharge Plan: SNF    Current Diagnoses: Patient Active Problem List   Diagnosis Date Noted  . Malnutrition of moderate degree 10/18/2017  . Gangrene of toe of right foot (HCC) 10/16/2017  . Cellulitis of great toe of right foot 10/16/2017  . Osteomyelitis of great toe of right foot (HCC) 10/16/2017  . Syncope 07/20/2015  . Aftercare following surgery of the circulatory system, NEC 09/09/2012  . Carotid artery stenosis 09/09/2012  . Peripheral vascular disease, unspecified (HCC) 07/29/2012  . Atherosclerosis of native arteries of the extremities with ulceration (HCC) 07/08/2012  . Type II or unspecified type diabetes mellitus with peripheral circulatory disorders, uncontrolled(250.72) 07/08/2012  . Occlusion and stenosis of carotid artery without mention of cerebral infarction 07/24/2011  . Hypertension   . COPD (chronic obstructive pulmonary disease) (HCC)   . Anemia due to blood loss, acute   . S/P CABG (coronary artery bypass graft) 07/01/2011  . Carotid artery disease (HCC) 07/01/2011  . Depression 06/07/2011  . Dyslipidemia   . CAD (coronary artery disease) 04/24/2011  . Insulin dependent type 2 diabetes mellitus, controlled (HCC)     Orientation RESPIRATION BLADDER Height & Weight     Self, Time, Situation, Place  Normal Continent, External  catheter Weight: 59 kg Height:  5\' 9"  (175.3 cm)  BEHAVIORAL SYMPTOMS/MOOD NEUROLOGICAL BOWEL NUTRITION STATUS      Continent Diet(Please see DC Summary)  AMBULATORY STATUS COMMUNICATION OF NEEDS Skin   Supervision Verbally Surgical wounds(Closed incision on foot)                       Personal Care Assistance Level of Assistance  Bathing, Dressing, Feeding Bathing Assistance: Limited assistance Feeding assistance: Independent Dressing Assistance: Limited assistance     Functional Limitations Info  Hearing   Hearing Info: Impaired      SPECIAL CARE FACTORS FREQUENCY  PT (By licensed PT), OT (By licensed OT)     PT Frequency: 5x/week OT Frequency: 3x/week            Contractures      Additional Factors Info  Code Status, Allergies, Insulin Sliding Scale Code Status Info: Full Allergies Info: NKA   Insulin Sliding Scale Info: 3x daily with meals       Current Medications (10/24/2017):  This is the current hospital active medication list Current Facility-Administered Medications  Medication Dose Route Frequency Provider Last Rate Last Dose  . 0.9 %  sodium chloride infusion  250 mL Intravenous PRN Cephus Shellinglark, Christopher J, MD 10 mL/hr at 10/23/17 2151    . acetaminophen (TYLENOL) tablet 650 mg  650 mg Oral Q4H PRN Cephus Shellinglark, Christopher J, MD   650 mg at 10/20/17 2214  . amoxicillin-clavulanate (AUGMENTIN) 875-125 MG per tablet 1 tablet  1 tablet Oral Q12H Burnadette PopAdhikari, Amrit, MD      . aspirin EC  tablet 81 mg  81 mg Oral Daily Cephus Shelling, MD   81 mg at 10/24/17 0818  . clopidogrel (PLAVIX) tablet 75 mg  75 mg Oral Q breakfast Cephus Shelling, MD   75 mg at 10/24/17 0819  . feeding supplement (ENSURE ENLIVE) (ENSURE ENLIVE) liquid 237 mL  237 mL Oral BID BM Cephus Shelling, MD   237 mL at 10/24/17 0819  . fenofibrate tablet 160 mg  160 mg Oral Daily Cephus Shelling, MD   160 mg at 10/24/17 2130  . hydrALAZINE (APRESOLINE) injection 5 mg  5 mg  Intravenous Q20 Min PRN Cephus Shelling, MD      . HYDROcodone-acetaminophen (NORCO/VICODIN) 5-325 MG per tablet 1 tablet  1 tablet Oral Q6H PRN Cephus Shelling, MD   1 tablet at 10/24/17 [phone]  . insulin aspart (novoLOG) injection 0-15 Units  0-15 Units Subcutaneous TID WC Cephus Shelling, MD   2 Units at 10/24/17 1203  . labetalol (NORMODYNE,TRANDATE) injection 10 mg  10 mg Intravenous Q10 min PRN Cephus Shelling, MD      . metoprolol tartrate (LOPRESSOR) tablet 12.5 mg  12.5 mg Oral BID Cephus Shelling, MD   12.5 mg at 10/24/17 8469  . multivitamin with minerals tablet 1 tablet  1 tablet Oral Daily Cephus Shelling, MD   1 tablet at 10/24/17 0818  . ondansetron (ZOFRAN) injection 4 mg  4 mg Intravenous Q6H PRN Cephus Shelling, MD      . ondansetron [facility]) tablet 4 mg  4 mg Oral Q6H PRN Cephus Shelling, MD      . simvastatin (ZOCOR) tablet 40 mg  40 mg Oral QHS Cephus Shelling, MD   40 mg at 10/23/17 2113  . sodium chloride flush (NS) 0.9 % injection 3 mL  3 mL Intravenous Q12H Cephus Shelling, MD   3 mL at 10/23/17 0905  . sodium chloride flush (NS) 0.9 % injection 3 mL  3 mL Intravenous PRN Cephus Shelling, MD         Discharge Medications: Please see discharge summary for a list of discharge medications.  Relevant Imaging Results:  Relevant Lab Results:   Additional Information SSN: [id number] [place], [place]

## 2017-10-24 NOTE — Progress Notes (Addendum)
PROGRESS NOTE    Daniel Tyler  ZOX:[id number] DOB: [birth year] DOA: 10/16/2017 PCP: Dois Davenport, MD   Brief Narrative: Patient is a 77 year old male with past medical history of peripheral vascular disease status post femoropopliteal bypass on 07/14/2012, coronary artery disease status post CABG in 2013, carotid endarterectomy, type 2 diabetes mellitus, hypertension, hyperlipidemia who presented here with worsening pain of the right toe. Found to have gangrene of the right toe on presentation. Vascular surgery consulted. Underwent right lower extremity arteriogram 2 stents placement and also amputation of right gangrenous toe.Hospital course was remarkable for developmentof acute blood loss anemia.He was  transfused with PRBC.  This morning he is hemodynamically stable.  Hemoglobin is stable.  He will be followed by vascular surgery as an outpatient.  Apparently, he was discharged with home health but power of attorney wants him to place in rehab.  Social worker consulted.  Cannot discharge the patient at this point until PT reevaluates/SW considers placement in skilled nursing facility.   Assessment & Plan:   Principal Problem:   Osteomyelitis of great toe of right foot (HCC) Active Problems:   Insulin dependent type 2 diabetes mellitus, controlled (HCC)   Hypertension   Atherosclerosis of native arteries of the extremities with ulceration (HCC)   Gangrene of toe of right foot (HCC)   Cellulitis of great toe of right foot   Malnutrition of moderate degree   Right toe osteomyelitis/peripheral vascular disease: Status post right lower extremity arteriogram with 2 stents placement on 10/19/2017 by vascular surgery. Also underwent a right toe amputation which was found to be gangrenous. Antibiotics changed to oral. Blood cultures have been negative so far. Physical therapyrecommended HHPT. He has a long history of peripheral vascular disease. Follow-up with vascular surgery  as an outpatient after discharge. He will be followed by advanced home health after discharge for dressing of his right foot.  Anemia: His baseline hemoglobin is around 10. Hemoglobin dropped to 6.2and he was transfused with 1 unit of PRBC.Likelyassociated with acute blood loss anemia secondary to surgery. He does not report any change in the color of his stool. Denies any abdominal pain. FOBT negative.  Coronary artery disease: Status post CABG. Continue metoprolol and statin. Continue Plavix,aspirin,statin.  Hypertension/hyperlipidemia: Continue current medications. Also on fenofibrate. He is normotensive at present.  Diabetes mellitus: Continue sliding scale insulin here. Patient is on glipizide and metformin at home    DVT prophylaxis: SCD Code Status: Full Family Communication: Discussed with power of attorney today on phone disposition Plan: Home health versus skilled nursing facility.  Awaiting PT evaluation, social worker discussion with the power of attorney for placement   Consultants: Vascular surgery  Procedures: Right lower extremity arteriogram with stent placement, right toe amputation  Antimicrobials: Vancomycin and Zosyn  10/16/2017-10/24/17 Augmentin started  Subjective: Patient seen this morning.  Hemodynamically stable but appears weaker today.  Objective: Vitals:   10/23/17 1939 10/23/17 1940 10/24/17 0559 10/24/17 1126  BP: 121/60  (!) 143/78 (!) 113/56  Pulse:  84 89 70  Resp: 18     Temp: 97.8 F (36.6 C)  98.6 F (37 C) 98.1 F (36.7 C)  TempSrc: Oral  Oral Oral  SpO2: (!) 8% 95% 97% 95%  Weight:      Height:        Intake/Output Summary (Last 24 hours) at 10/24/2017 1237 Last data filed at 10/24/2017 1129 Gross per 24 hour  Intake 380 ml  Output 2340 ml  Net -1960 ml  Filed Weights   10/16/17 1814  Weight: 59 kg    Examination:  General exam: Appears calm and comfortable ,Not in distress,elderly  gentleman HEENT:PERRL,Oral mucosa moist, Ear/Nose normal on gross exam Respiratory system: Bilateral equal air entry, normal vesicular breath sounds, no wheezes or crackles  Cardiovascular system: S1 & S2 heard, RRR. No JVD, murmurs, rubs, gallops or clicks. No pedal edema. Gastrointestinal system: Abdomen is nondistended, soft and nontender. No organomegaly or masses felt. Normal bowel sounds heard. Central nervous system: Alert and oriented. No focal neurological deficits. Extremities: No edema, no clubbing ,no cyanosis, right foot covered with dressing. Skin: No rashes, lesions or ulcers,no icterus ,no pallor MSK: Normal muscle bulk,tone ,power Psychiatry: Judgement and insight appear normal. Mood & affect appropriate.     Data Reviewed: I have personally reviewed following labs and imaging studies  CBC: Recent Labs  Lab 10/18/17 0520 10/20/17 0308 10/21/17 0314 10/22/17 0514 10/22/17 1808 10/23/17 0314  WBC 12.5* 13.1* 14.7* 11.7*  --  13.3*  NEUTROABS  --   --   --  8.0*  --  9.2*  HGB 10.3* 9.3* 8.0* 6.2* 8.8* 7.7*  HCT 32.0* 29.2* 24.7* 19.1* 27.2* 23.7*  MCV 87.9 87.4 87.0 86.4  --  86.8  PLT 554* 519* 486* 422*  --  496*   Basic Metabolic Panel: Recent Labs  Lab 10/18/17 0520 10/19/17 0324 10/20/17 0308 10/21/17 0314 10/21/17 0844 10/23/17 0314  NA 132* 130* 131* 131* 135 136  K 5.3* 4.2 4.1 4.6 4.3 4.2  CL 104 101 102 103 104 108  CO2 20* 19* 21* 22 22 22   GLUCOSE 123* 167* 187* 341* 200* 182*  BUN 11 7* 9 13 11 8   CREATININE 0.99 0.91 0.88 1.00 1.07 0.83  CALCIUM 8.4* 8.5* 8.4* 8.2* 8.4* 8.0*  MG 1.9  --  1.8  --   --   --    GFR: Estimated Creatinine Clearance: 62.2 mL/min (by C-G formula based on SCr of 0.83 mg/dL). Liver Function Tests: No results for input(s): AST, ALT, ALKPHOS, BILITOT, PROT, ALBUMIN in the last 168 hours. No results for input(s): LIPASE, AMYLASE in the last 168 hours. No results for input(s): AMMONIA in the last 168  hours. Coagulation Profile: No results for input(s): INR, PROTIME in the last 168 hours. Cardiac Enzymes: No results for input(s): CKTOTAL, CKMB, CKMBINDEX, TROPONINI in the last 168 hours. BNP (last 3 results) No results for input(s): PROBNP in the last 8760 hours. HbA1C: No results for input(s): HGBA1C in the last 72 hours. CBG: Recent Labs  Lab 10/23/17 1109 10/23/17 1557 10/23/17 2030 10/24/17 0603 10/24/17 1125  GLUCAP 319* 316* 93 183* 139*   Lipid Profile: No results for input(s): CHOL, HDL, LDLCALC, TRIG, CHOLHDL, LDLDIRECT in the last 72 hours. Thyroid Function Tests: No results for input(s): TSH, T4TOTAL, FREET4, T3FREE, THYROIDAB in the last 72 hours. Anemia Panel: No results for input(s): VITAMINB12, FOLATE, FERRITIN, TIBC, IRON, RETICCTPCT in the last 72 hours. Sepsis Labs: No results for input(s): PROCALCITON, LATICACIDVEN in the last 168 hours.  Recent Results (from the past 240 hour(s))  Blood culture (routine x 2)     Status: None   Collection Time: 10/16/17  9:50 PM  Result Value Ref Range Status   Specimen Description BLOOD RIGHT HAND  Final   Special Requests   Final    BOTTLES DRAWN AEROBIC AND ANAEROBIC Blood Culture adequate volume   Culture   Final    NO GROWTH 5 DAYS Performed at  [facility], [address] [place], [place] [address]    Report Status 10/21/2017 FINAL  Final  Blood culture (routine x 2)     Status: None   Collection Time: 10/16/17  9:55 PM  Result Value Ref Range Status   Specimen Description BLOOD LEFT ARM  Final   Special Requests   Final    BOTTLES DRAWN AEROBIC AND ANAEROBIC Blood Culture adequate volume   Culture   Final    NO GROWTH 5 DAYS Performed at [facility], [address] [place], [place] [address]    Report Status 10/21/2017 FINAL  Final         Radiology Studies: No results found.      Scheduled Meds: . amoxicillin-clavulanate  1 tablet Oral Q12H  . aspirin EC  81 mg Oral Daily   . clopidogrel  75 mg Oral Q breakfast  . feeding supplement (ENSURE ENLIVE)  237 mL Oral BID BM  . fenofibrate  160 mg Oral Daily  . insulin aspart  0-15 Units Subcutaneous TID WC  . metoprolol tartrate  12.5 mg Oral BID  . multivitamin with minerals  1 tablet Oral Daily  . simvastatin  40 mg Oral QHS  . sodium chloride flush  3 mL Intravenous Q12H   Continuous Infusions: . sodium chloride 10 mL/hr at 10/23/17 2151     LOS: 8 days    Time spent:25 mins. More than 50% of that time was spent in counseling and/or coordination of care.      Burnadette PopAmrit Azlan Hanway, MD [facility] Pager [phone]  If 7PM-7AM, please contact night-coverage [email or web address] Password [email or web address] 10/24/2017, 12:37 PM

## 2017-10-25 LAB — CBC WITH DIFFERENTIAL/PLATELET
Abs Immature Granulocytes: 0.2 10*3/uL — ABNORMAL HIGH (ref 0.0–0.1)
BASOS ABS: 0.1 10*3/uL (ref 0.0–0.1)
BASOS PCT: 1 %
Eosinophils Absolute: 0.8 10*3/uL — ABNORMAL HIGH (ref 0.0–0.7)
Eosinophils Relative: 7 %
HCT: 26.1 % — ABNORMAL LOW (ref 39.0–52.0)
Hemoglobin: 8.4 g/dL — ABNORMAL LOW (ref 13.0–17.0)
IMMATURE GRANULOCYTES: 1 %
Lymphocytes Relative: 19 %
Lymphs Abs: 2.3 10*3/uL (ref 0.7–4.0)
MCH: 28.6 pg (ref 26.0–34.0)
MCHC: 32.2 g/dL (ref 30.0–36.0)
MCV: 88.8 fL (ref 78.0–100.0)
Monocytes Absolute: 1.1 10*3/uL — ABNORMAL HIGH (ref 0.1–1.0)
Monocytes Relative: 9 %
Neutro Abs: 7.7 10*3/uL (ref 1.7–7.7)
Neutrophils Relative %: 63 %
PLATELETS: 563 10*3/uL — AB (ref 150–400)
RBC: 2.94 MIL/uL — AB (ref 4.22–5.81)
RDW: 15.6 % — AB (ref 11.5–15.5)
WBC: 12.1 10*3/uL — AB (ref 4.0–10.5)

## 2017-10-25 LAB — GLUCOSE, CAPILLARY
GLUCOSE-CAPILLARY: 221 mg/dL — AB (ref 70–99)
Glucose-Capillary: 217 mg/dL — ABNORMAL HIGH (ref 70–99)

## 2017-10-25 MED ORDER — AMOXICILLIN-POT CLAVULANATE 875-125 MG PO TABS: 1.0000 | ORAL_TABLET | Freq: Two times a day (BID) | ORAL | 0 refills | Status: AC

## 2017-10-25 NOTE — Clinical Social Work Note (Addendum)
Per MD now pt's power of attorney wants pt to go to SNF, however, according to PT note pt has refused SNF. CSW will speak with pt again, pt is oriented x4 and his own decision maker until disoriented. CSW will be unable to start insurance auth for SNF until Tuesday. CSW will search for LOG bed.    [facility] and [place] are not taking pt with LOG any longer. Awaiting response from [facility].  12:22- [facility] will take pt with 5 day LOG, AD has agreed. MD notified to update summary.  [place], [place] [phone]

## 2017-10-25 NOTE — Clinical Social Work Placement (Signed)
   CLINICAL SOCIAL WORK PLACEMENT  NOTE  Date:  10/25/2017  Patient Details  Name: Daniel Tyler MRN: [id number] Date of Birth: 10-Sep-1940  Clinical Social Work is seeking post-discharge placement for this patient at the Skilled  Nursing Facility level of care (*CSW will initial, date and re-position this form in  chart as items are completed):  Yes   Patient/family provided with [facility] Clinical Social Work Department's list of facilities offering this level of care within the geographic area requested by the patient (or if unable, by the patient's family).  Yes   Patient/family informed of their freedom to choose among providers that offer the needed level of care, that participate in Medicare, Medicaid or managed care program needed by the patient, have an available bed and are willing to accept the patient.  Yes   Patient/family informed of [facility]'s ownership interest in [facility] and [facility], as well as of the fact that they are under no obligation to receive care at these facilities.  PASRR submitted to EDS on       PASRR number received on       Existing PASRR number confirmed on 10/24/17     FL2 transmitted to all facilities in geographic area requested by pt/family on 10/24/17     FL2 transmitted to all facilities within larger geographic area on       Patient informed that his/her managed care company has contracts with or will negotiate with certain facilities, including the following:        Yes   Patient/family informed of bed offers received.  Patient chooses bed at [facility]     Physician recommends and patient chooses bed at      Patient to be transferred to [facility] on 10/25/17.  Patient to be transferred to facility by [facility]     Patient family notified on 10/25/17 of transfer.  Name of family member notified:  [name]     PHYSICIAN Please prepare priority discharge summary, including medications, Please prepare prescriptions      Additional Comment:    _______________________________________________ Maree Krabbe, LCSW 10/25/2017, 12:24 PM

## 2017-10-25 NOTE — Clinical Social Work Note (Signed)
Clinical Social Work Assessment  Patient Details  Name: Daniel Tyler MRN: [id number] Date of Birth: 1940-07-23  Date of referral:  10/25/17               Reason for consult:  Facility Placement                Permission sought to share information with:  Family Supports Permission granted to share information::  Yes, Verbal Permission Granted  Name::     Futures trader::  [employer]  Relationship::  Power of Copy Information:     Housing/Transportation Living arrangements for the past 2 months:  Single Family Home Source of Information:  Patient, Power of Attorney Patient Interpreter Needed:  None Criminal Activity/Legal Involvement Pertinent to Current Situation/Hospitalization:  No - Comment as needed Significant Relationships:  Other(Comment)(Power of attorney) Lives with:  Self Do you feel safe going back to the place where you live?  No Need for family participation in patient care:  No (Coment)  Care giving concerns:  Pt is alert and oriented.    Social Worker assessment / plan:  CSW spoke with pt at bedside. Pt at first refused SNF however, now pt is agreeable. Only facility that will take pt with LOG pending insurance is [place]. CSW AD agreed to 5 day LOG. MD updated. Pt updated and agreed. CSW will update pt's POA.  Employment status:  Retired Database administrator PT Recommendations:  Skilled Nursing Facility Information / Referral to community resources:  Skilled Nursing Facility  Patient/Family's Response to care:  Pt verbalized understanding of CSW role and expressed appreciation for support. Pt denies any concern regarding pt care at this time.   Patient/Family's Understanding of and Emotional Response to Diagnosis, Current Treatment, and Prognosis:  Pt understanding and realistic regarding physical limitations. Pt understands the need for SNF placement at d/c. Pt agreeable to SNF placement at d/c, at this time. Pt's responses  emotionally appropriate during conversation with CSW. Pt denies any concern regarding treatment plan at this time. CSW will continue to provide support and facilitate d/c needs.   Emotional Assessment Appearance:  Appears stated age Attitude/Demeanor/Rapport:  (Patient was appropriate) Affect (typically observed):  Accepting, Appropriate, Calm Orientation:  Oriented to Situation, Oriented to  Time, Oriented to Place, Oriented to Self Alcohol / Substance use:  Not Applicable Psych involvement (Current and /or in the community):  No (Comment)  Discharge Needs  Concerns to be addressed:  Basic Needs Readmission within the last 30 days:  No Current discharge risk:  Dependent with Mobility Barriers to Discharge:  Continued Medical Work up   [employer], LCSW 10/25/2017, 12:30 PM

## 2017-10-25 NOTE — Progress Notes (Signed)
PROGRESS NOTE    Daniel Tyler  ZOX:[id number] DOB: [birth year] DOA: 10/16/2017 PCP: Dois Davenport, MD   Brief Narrative: Patient is a 77 year old male with past medical history of peripheral vascular disease status post femoropopliteal bypass on 07/14/2012, coronary artery disease status post CABG in 2013, carotid endarterectomy, type 2 diabetes mellitus, hypertension, hyperlipidemia who presented here with worsening pain of the right toe. Found to have gangrene of the right toe on presentation. Vascular surgery consulted. Underwent right lower extremity arteriogram 2 stents placement and also amputation of right gangrenous toe.Hospital course was remarkable for developmentof acute blood loss anemia.He was  transfused with PRBC.  This morning he is hemodynamically stable.  Hemoglobin is stable.  He will be followed by vascular surgery as an outpatient.  Apparently, he was discharged with home health but power of attorney wants him to place in rehab.  Social worker consulted.  He is finally being discharged to skilled nursing facility today  Assessment & Plan:   Principal Problem:   Osteomyelitis of great toe of right foot (HCC) Active Problems:   Insulin dependent type 2 diabetes mellitus, controlled (HCC)   Hypertension   Atherosclerosis of native arteries of the extremities with ulceration (HCC)   Gangrene of toe of right foot (HCC)   Cellulitis of great toe of right foot   Malnutrition of moderate degree   Right toe osteomyelitis/peripheral vascular disease: Status post right lower extremity arteriogram with 2 stents placement on 10/19/2017 by vascular surgery. Also underwent a right toe amputation which was found to be gangrenous. Antibiotics changed to oral. Blood cultures have been negative so far. Physical therapyrecommended HHPT. He has a long history of peripheral vascular disease. Follow-up with vascular surgery as an outpatient after discharge. He will be followed  by advanced home health after discharge for dressing of his right foot.  Anemia: His baseline hemoglobin is around 10. Hemoglobin dropped to 6.2and he was transfused with 1 unit of PRBC.Likelyassociated with acute blood loss anemia secondary to surgery. He does not report any change in the color of his stool. Denies any abdominal pain. FOBT negative.Hb this morning 8.4  Coronary artery disease: Status post CABG. Continue metoprolol and statin. Continue Plavix,aspirin,statin.  Hypertension/hyperlipidemia: Continue current medications. Also on fenofibrate. He is normotensive at present.  Diabetes mellitus: Continue sliding scale insulin here. Patient is on glipizide and metformin at home    DVT prophylaxis: SCD Code Status: Full Family Communication: Discussed with power of attorney today on phone disposition Plan: DC today to SNF   Consultants: Vascular surgery  Procedures: Right lower extremity arteriogram with stent placement, right toe amputation  Antimicrobials: Vancomycin and Zosyn  10/16/2017-10/24/17 Augmentin started  Subjective: Patient seen this morning.  Hemodynamically stable .  Remains comfortable.  Eating his breakfast.  Complains of generalized ache.  Objective: Vitals:   10/24/17 1126 10/24/17 2030 10/25/17 0524 10/25/17 1117  BP: (!) 113/56 (!) 121/52 134/63 131/69  Pulse: 70 94 76 78  Resp:  17    Temp: 98.1 F (36.7 C) 97.8 F (36.6 C) 97.8 F (36.6 C) (!) 97.3 F (36.3 C)  TempSrc: Oral Oral Oral Oral  SpO2: 95% 93% 95%   Weight:      Height:        Intake/Output Summary (Last 24 hours) at 10/25/2017 1245 Last data filed at 10/25/2017 1108 Gross per 24 hour  Intake 50 ml  Output 1340 ml  Net -1290 ml   Filed Weights   10/16/17 1814  Weight:  59 kg    Examination:  General exam: Appears calm and comfortable ,Not in distress,elderly gentleman HEENT:PERRL,Oral mucosa moist, Ear/Nose normal on gross exam Respiratory system: Bilateral  equal air entry, normal vesicular breath sounds, no wheezes or crackles  Cardiovascular system: S1 & S2 heard, RRR. No JVD, murmurs, rubs, gallops or clicks. No pedal edema. Gastrointestinal system: Abdomen is nondistended, soft and nontender. No organomegaly or masses felt. Normal bowel sounds heard. Central nervous system: Alert and oriented. No focal neurological deficits. Extremities: No edema, no clubbing ,no cyanosis, right foot covered with dressing. Skin: No rashes, lesions or ulcers,no icterus ,no pallor MSK: Normal muscle bulk,tone ,power Psychiatry: Judgement and insight appear normal. Mood & affect appropriate.     Data Reviewed: I have personally reviewed following labs and imaging studies  CBC: Recent Labs  Lab 10/21/17 0314 10/22/17 0514 10/22/17 1808 10/23/17 0314 10/24/17 1347 10/25/17 0341  WBC 14.7* 11.7*  --  13.3* 11.8* 12.1*  NEUTROABS  --  8.0*  --  9.2*  --  7.7  HGB 8.0* 6.2* 8.8* 7.7* 8.0* 8.4*  HCT 24.7* 19.1* 27.2* 23.7* 24.5* 26.1*  MCV 87.0 86.4  --  86.8 88.8 88.8  PLT 486* 422*  --  496* 498* 563*   Basic Metabolic Panel: Recent Labs  Lab 10/19/17 0324 10/20/17 0308 10/21/17 0314 10/21/17 0844 10/23/17 0314  NA 130* 131* 131* 135 136  K 4.2 4.1 4.6 4.3 4.2  CL 101 102 103 104 108  CO2 19* 21* 22 22 22   GLUCOSE 167* 187* 341* 200* 182*  BUN 7* 9 13 11 8   CREATININE 0.91 0.88 1.00 1.07 0.83  CALCIUM 8.5* 8.4* 8.2* 8.4* 8.0*  MG  --  1.8  --   --   --    GFR: Estimated Creatinine Clearance: 62.2 mL/min (by C-G formula based on SCr of 0.83 mg/dL). Liver Function Tests: No results for input(s): AST, ALT, ALKPHOS, BILITOT, PROT, ALBUMIN in the last 168 hours. No results for input(s): LIPASE, AMYLASE in the last 168 hours. No results for input(s): AMMONIA in the last 168 hours. Coagulation Profile: No results for input(s): INR, PROTIME in the last 168 hours. Cardiac Enzymes: No results for input(s): CKTOTAL, CKMB, CKMBINDEX, TROPONINI  in the last 168 hours. BNP (last 3 results) No results for input(s): PROBNP in the last 8760 hours. HbA1C: No results for input(s): HGBA1C in the last 72 hours. CBG: Recent Labs  Lab 10/24/17 1125 10/24/17 1616 10/24/17 2148 10/25/17 0621 10/25/17 1107  GLUCAP 139* 330* 281* 221* 217*   Lipid Profile: No results for input(s): CHOL, HDL, LDLCALC, TRIG, CHOLHDL, LDLDIRECT in the last 72 hours. Thyroid Function Tests: No results for input(s): TSH, T4TOTAL, FREET4, T3FREE, THYROIDAB in the last 72 hours. Anemia Panel: No results for input(s): VITAMINB12, FOLATE, FERRITIN, TIBC, IRON, RETICCTPCT in the last 72 hours. Sepsis Labs: No results for input(s): PROCALCITON, LATICACIDVEN in the last 168 hours.  Recent Results (from the past 240 hour(s))  Blood culture (routine x 2)     Status: None   Collection Time: 10/16/17  9:50 PM  Result Value Ref Range Status   Specimen Description BLOOD RIGHT HAND  Final   Special Requests   Final    BOTTLES DRAWN AEROBIC AND ANAEROBIC Blood Culture adequate volume   Culture   Final    NO GROWTH 5 DAYS Performed at [facility], [address] [place], [place] [address]    Report Status 10/21/2017 FINAL  Final  Blood culture (routine x 2)     Status: None   Collection Time: 10/16/17  9:55 PM  Result Value Ref Range Status   Specimen Description BLOOD LEFT ARM  Final   Special Requests   Final    BOTTLES DRAWN AEROBIC AND ANAEROBIC Blood Culture adequate volume   Culture   Final    NO GROWTH 5 DAYS Performed at [facility], [address] [place], [place] [address]    Report Status 10/21/2017 FINAL  Final         Radiology Studies: No results found.      Scheduled Meds: . amoxicillin-clavulanate  1 tablet Oral Q12H  . aspirin EC  81 mg Oral Daily  . clopidogrel  75 mg Oral Q breakfast  . feeding supplement (ENSURE ENLIVE)  237 mL Oral BID BM  . fenofibrate  160 mg Oral Daily  . insulin aspart  0-15 Units  Subcutaneous TID WC  . metoprolol tartrate  12.5 mg Oral BID  . multivitamin with minerals  1 tablet Oral Daily  . simvastatin  40 mg Oral QHS  . sodium chloride flush  3 mL Intravenous Q12H   Continuous Infusions: . sodium chloride 10 mL/hr at 10/23/17 2151     LOS: 9 days    Time spent:25 mins. More than 50% of that time was spent in counseling and/or coordination of care.      Burnadette Pop, MD [facility] Pager [phone]  If 7PM-7AM, please contact night-coverage [email or web address] Password [email or web address] 10/25/2017, 12:45 PM

## 2017-10-25 NOTE — Clinical Social Work Note (Signed)
CSW needs D/C order in order to set up transport.  [place], [place] [phone]

## 2017-10-25 NOTE — Progress Notes (Signed)
Physical Therapy Treatment Patient Details Name: Daniel Tyler MRN: [id number] DOB: Dec 29, 1940 Today's Date: 10/25/2017    History of Present Illness Patient is a 77 year old male with past medical history of peripheral vascular disease status post femoropopliteal bypass on 07/14/2012, coronary artery disease status post CABG in 2013, carotid endarterectomy, type 2 diabetes mellitus, hypertension, hyperlipidemia who presented here with worsening pain of the right toe.  Found to have gangrene of the right toe on presentation.  Vascular surgery consulted.  Underwent right lower extremity arteriogram 2 stents placement and also amputation of right gangrenous toe.    PT Comments    Pt continues to progress slowly with mobility. Pt is now agreeable to ST-SNF which I think is appropriate.   Follow Up Recommendations  Home health PT;SNF     Equipment Recommendations  None recommended by PT    Recommendations for Other Services       Precautions / Restrictions Precautions Precautions: Fall Required Braces or Orthoses: Other Brace/Splint Other Brace/Splint: Post op shoe on rt  Restrictions Weight Bearing Restrictions: Yes Other Position/Activity Restrictions: heel weight bearing on rt    Mobility  Bed Mobility Overal bed mobility: Modified Independent             General bed mobility comments: Inc time  Transfers Overall transfer level: Needs assistance Equipment used: Rolling walker (2 wheeled) Transfers: Sit to/from Stand Sit to Stand: Min assist         General transfer comment: Assist to bring hips up and for balance  Ambulation/Gait Ambulation/Gait assistance: Min guard Gait Distance (Feet): 65 Feet Assistive device: Rolling walker (2 wheeled) Gait Pattern/deviations: Step-through pattern;Decreased stride length;Trunk flexed Gait velocity: decr Gait velocity interpretation: <1.8 ft/sec, indicate of risk for recurrent falls General Gait Details: Assist for safety  and verbal cues to stay closer to walker and stand more erect   Stairs             Wheelchair Mobility    Modified Rankin (Stroke Patients Only)       Balance Overall balance assessment: Needs assistance Sitting-balance support: No upper extremity supported;Feet supported Sitting balance-Leahy Scale: Good     Standing balance support: Bilateral upper extremity supported;During functional activity Standing balance-Leahy Scale: Poor Standing balance comment: walker and supervision for static standing                            Cognition Arousal/Alertness: Awake/alert Behavior During Therapy: WFL for tasks assessed/performed Overall Cognitive Status: No family/caregiver present to determine baseline cognitive functioning                           Safety/Judgement: Decreased awareness of safety;Decreased awareness of deficits   Problem Solving: Slow processing;Difficulty sequencing;Requires verbal cues        Exercises      General Comments        Pertinent Vitals/Pain Pain Assessment: Faces Faces Pain Scale: Hurts little more Pain Location: right foot Pain Descriptors / Indicators: Aching;Grimacing;Guarding    Home Living                      Prior Function            PT Goals (current goals can now be found in the care plan section) Progress towards PT goals: Progressing toward goals    Frequency    Min 3X/week      PT  Plan Discharge plan needs to be updated    Co-evaluation              AM-PAC PT "6 Clicks" Daily Activity  Outcome Measure  Difficulty turning over in bed (including adjusting bedclothes, sheets and blankets)?: None Difficulty moving from lying on back to sitting on the side of the bed? : None Difficulty sitting down on and standing up from a chair with arms (e.g., wheelchair, bedside commode, etc,.)?: Unable Help needed moving to and from a bed to chair (including a wheelchair)?: A  Little Help needed walking in hospital room?: A Little Help needed climbing 3-5 steps with a railing? : A Little 6 Click Score: 18    End of Session Equipment Utilized During Treatment: Gait belt Activity Tolerance: Patient limited by fatigue Patient left: with call bell/phone within reach;in bed;with bed alarm set   PT Visit Diagnosis: Unsteadiness on feet (R26.81);Muscle weakness (generalized) (M62.81);Pain Pain - Right/Left: Right Pain - part of body: Ankle and joints of foot     Time: 6004-5997 PT Time Calculation (min) (ACUTE ONLY): 20 min  Charges:  $Gait Training: 8-22 mins                     [facility] PT Acute Rehabilitation Services Pager [phone] Office [phone]    Angelina Ok [facility] 10/25/2017, 2:04 PM

## 2017-10-25 NOTE — Clinical Social Work Note (Signed)
Clinical Social Worker facilitated patient discharge including contacting patient family and facility to confirm patient discharge plans.  Clinical information faxed to facility and family agreeable with plan.  CSW arranged ambulance transport via [facility] to [facility].  RN to call [phone] for report prior to discharge.  Clinical Social Worker will sign off for now as social work intervention is no longer needed. Please consult us again if new need arises.  Dawnielle Christiana, LCSWA [phone]    

## 2017-10-26 LAB — TYPE AND SCREEN
ABO/RH(D): A POS
ANTIBODY SCREEN: NEGATIVE
UNIT DIVISION: 0
Unit division: 0

## 2017-10-26 LAB — BPAM RBC
BLOOD PRODUCT EXPIRATION DATE: 201909172359
BLOOD PRODUCT EXPIRATION DATE: 201909172359
ISSUE DATE / TIME: 201908291317
Unit Type and Rh: 6200
Unit Type and Rh: 6200

## 2017-10-29 ENCOUNTER — Encounter (HOSPITAL_COMMUNITY): Payer: Self-pay | Admitting: Vascular Surgery

## 2017-10-29 NOTE — Addendum Note (Signed)
Addendum  created 10/29/17 1531 by Heather Roberts, MD   Intraprocedure Event edited, Intraprocedure Staff edited

## 2017-11-13 ENCOUNTER — Other Ambulatory Visit: Payer: Self-pay

## 2017-11-13 ENCOUNTER — Encounter: Payer: Self-pay | Admitting: Vascular Surgery

## 2017-11-13 ENCOUNTER — Ambulatory Visit (INDEPENDENT_AMBULATORY_CARE_PROVIDER_SITE_OTHER): Payer: Medicare Other | Admitting: Vascular Surgery

## 2017-11-13 VITALS — BP 112/69 | HR 64 | Temp 97.0°F | Resp 20 | Ht 69.0 in | Wt 124.0 lb

## 2017-11-13 DIAGNOSIS — L97511 Non-pressure chronic ulcer of other part of right foot limited to breakdown of skin: Secondary | ICD-10-CM

## 2017-11-13 NOTE — Progress Notes (Signed)
.      Subjective:     Patient ID: Daniel Tyler, male   DOB: Aug 29, 1940, 77 y.o.   MRN: [id number]  HPI 77 year old male follows up after endovascular intervention of his right lower extremity distal anastomosis from a previous right femoral to below-knee popliteal bypass as well as tenting of his right common iliac artery.  He is ultimately underwent amputation of his right great toe.  This is now healing well with wound care.  He is here today for wound check.  He has not had any fevers has not required any antibiotics.   Review of Systems Right great toe amputation site wound    Objective:   Physical Exam Awake alert oriented Nonlabored respirations Palpable bilateral common femoral pulses Strong signal right peroneal artery Sutures removed from right great toe amputation site, the lateral aspect that is still open was debrided and there was good blood flow and it wet-to-dry dressing was placed    Assessment:     77 year old male status post right lower extremity revascularization endovascularly as well as great toe amputation.    Plan:     Continue wet-to-dry dressings Follow-up in 3 to 4 weeks with ABIs as well as right lower extremity duplex and for wound check.  Madisun Hargrove C. Randie Heinzain, MD [facility] [place] Office: [phone] Pager: [phone]

## 2017-11-19 ENCOUNTER — Ambulatory Visit: Payer: Medicare Other | Admitting: Vascular Surgery

## 2017-11-23 ENCOUNTER — Other Ambulatory Visit: Payer: Self-pay

## 2017-11-23 DIAGNOSIS — L97511 Non-pressure chronic ulcer of other part of right foot limited to breakdown of skin: Secondary | ICD-10-CM

## 2017-12-07 ENCOUNTER — Encounter (HOSPITAL_COMMUNITY): Payer: Medicare Other

## 2017-12-08 ENCOUNTER — Ambulatory Visit (INDEPENDENT_AMBULATORY_CARE_PROVIDER_SITE_OTHER): Payer: Medicare Other | Admitting: Sports Medicine

## 2017-12-08 ENCOUNTER — Encounter: Payer: Self-pay | Admitting: Sports Medicine

## 2017-12-08 DIAGNOSIS — IMO0002 Reserved for concepts with insufficient information to code with codable children: Secondary | ICD-10-CM

## 2017-12-08 DIAGNOSIS — I739 Peripheral vascular disease, unspecified: Secondary | ICD-10-CM

## 2017-12-08 DIAGNOSIS — E785 Hyperlipidemia, unspecified: Secondary | ICD-10-CM | POA: Insufficient documentation

## 2017-12-08 DIAGNOSIS — B351 Tinea unguium: Secondary | ICD-10-CM | POA: Diagnosis not present

## 2017-12-08 DIAGNOSIS — E1142 Type 2 diabetes mellitus with diabetic polyneuropathy: Secondary | ICD-10-CM

## 2017-12-08 DIAGNOSIS — Z89419 Acquired absence of unspecified great toe: Secondary | ICD-10-CM | POA: Diagnosis not present

## 2017-12-08 DIAGNOSIS — F419 Anxiety disorder, unspecified: Secondary | ICD-10-CM | POA: Insufficient documentation

## 2017-12-08 DIAGNOSIS — M79609 Pain in unspecified limb: Secondary | ICD-10-CM

## 2017-12-08 DIAGNOSIS — E1165 Type 2 diabetes mellitus with hyperglycemia: Secondary | ICD-10-CM

## 2017-12-08 NOTE — Progress Notes (Signed)
Subjective: Daniel Tyler is a 77 y.o. male patient with history of diabetes who presents to office today complaining of long,mildly painful nails unable to trim patient is from assisted living/nursing home [facility].  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  Patient reports that his toe and boot was amputated a few months ago however is unable to give a clear history.  Patient reports that he is feeling cold in office otherwise denies nausea vomiting fever or any other constitutional symptoms at this time.  Patient Active Problem List   Diagnosis Date Noted  . Anxiety 12/08/2017  . Hyperlipidemia 12/08/2017  . Malnutrition of moderate degree 10/18/2017  . Gangrene of toe of right foot ([place]) 10/16/2017  . Cellulitis of great toe of right foot 10/16/2017  . Osteomyelitis of great toe of right foot ([place]) 10/16/2017  . Uncontrolled type 2 diabetes mellitus with peripheral neuropathy ([place]) 06/10/2016  . Skin lesion 11/30/2015  . Anemia 09/15/2015  . Right foot ulcer ([place]) 09/15/2015  . Renal insufficiency 09/15/2015  . Syncope 07/20/2015  . Aftercare following surgery of the circulatory system, [place] 09/09/2012  . Carotid artery stenosis 09/09/2012  . Peripheral vascular disease, unspecified ([place]) 07/29/2012  . Atherosclerosis of native arteries of the extremities with ulceration ([place]) 07/08/2012  . Type II or unspecified type diabetes mellitus with peripheral circulatory disorders, uncontrolled(250.72) 07/08/2012  . Diabetic foot ulcer ([place]) 06/26/2012  . Occlusion and stenosis of carotid artery without mention of cerebral infarction 07/24/2011  . Hypertension   . COPD (chronic obstructive pulmonary disease) ([place])   . Anemia due to blood loss, acute   . S/P CABG (coronary artery bypass graft) 07/01/2011   . Carotid artery disease ([place]) 07/01/2011  . Disorder of arteries and arterioles ([place]) 07/01/2011  . Depression 06/07/2011  . Dyslipidemia   . CAD (coronary artery disease) 04/24/2011  . Insulin dependent type 2 diabetes mellitus, controlled ([place])    Current Outpatient Medications on File Prior to Visit  Medication Sig Dispense Refill  . acetaminophen (TYLENOL) 500 MG tablet Take 1,000-1,500 mg by mouth daily as needed for mild pain.    Marland Kitchen aspirin EC 81 MG tablet Take 81 mg by mouth daily.    . clopidogrel (PLAVIX) 75 MG tablet Take 1 tablet (75 mg total) by mouth daily with breakfast. 30 tablet 0  . fenofibrate 160 MG tablet Take 160 mg by mouth daily.    Marland Kitchen glipiZIDE (GLUCOTROL) 10 MG tablet Take 10 mg by mouth 2 (two) times daily before a meal.    . HYDROcodone-acetaminophen (NORCO/VICODIN) 5-325 MG tablet Take 1 tablet by mouth every 6 (six) hours as needed for moderate pain. 20 tablet 0  . magnesium oxide (MAG-OX) 400 (241.3 Mg) MG tablet Take 1 tablet (400 mg total) by mouth 2 (two) times daily. 40 tablet 0  . metFORMIN (GLUCOPHAGE) 1000 MG tablet TAKE ONE TABLET BY MOUTH TWICE DAILY FOR  DIABETES (Patient taking differently: Take 1,000 mg by mouth 2 (two) times daily with a meal. ) 60 tablet 0  . metoprolol tartrate (LOPRESSOR) 25 MG tablet Take 12.5 mg by mouth 2 (two) times daily.    . simvastatin (ZOCOR) 40 MG tablet Take 1 tablet (40 mg total) by mouth at bedtime. 30 tablet 0   No current facility-administered medications on file prior to  visit.    No Known Allergies  Recent Results (from the past 2160 hour(s))  Basic metabolic panel     Status: Abnormal   Collection Time: 10/16/17  6:42 PM  Result Value Ref Range   Sodium 129 (L) 135 - 145 mmol/L   Potassium 4.9 3.5 - 5.1 mmol/L   Chloride 100 98 - 111 mmol/L   CO2 20 (L) 22 - 32 mmol/L   Glucose, Bld 242 (H) 70 - 99 mg/dL   BUN 16 8 - 23 mg/dL   Creatinine, Ser 1.11 0.61 - 1.24 mg/dL   Calcium 9.0 8.9 - 10.3 mg/dL    GFR calc non Af Amer >60 >60 mL/min   GFR calc Af Amer >60 >60 mL/min    Comment: (NOTE) The eGFR has been calculated using the CKD EPI equation. This calculation has not been validated in all clinical situations. eGFR's persistently <60 mL/min signify possible Chronic Kidney Disease.    Anion gap 9 5 - 15    Comment: Performed at [facility] [address], [place], [place] [address]  CBC with Differential     Status: Abnormal   Collection Time: 10/16/17  6:42 PM  Result Value Ref Range   WBC 18.6 (H) 4.0 - 10.5 K/uL   RBC 3.78 (L) 4.22 - 5.81 MIL/uL   Hemoglobin 10.7 (L) 13.0 - 17.0 g/dL   HCT 33.5 (L) 39.0 - 52.0 %   MCV 88.6 78.0 - 100.0 fL   MCH 28.3 26.0 - 34.0 pg   MCHC 31.9 30.0 - 36.0 g/dL   RDW 14.6 11.5 - 15.5 %   Platelets 519 (H) 150 - 400 K/uL   Neutrophils Relative % 86 %   Neutro Abs 16.1 (H) 1.7 - 7.7 K/uL   Lymphocytes Relative 6 %   Lymphs Abs 1.1 0.7 - 4.0 K/uL   Monocytes Relative 7 %   Monocytes Absolute 1.2 (H) 0.1 - 1.0 K/uL   Eosinophils Relative 0 %   Eosinophils Absolute 0.1 0.0 - 0.7 K/uL   Basophils Relative 0 %   Basophils Absolute 0.1 0.0 - 0.1 K/uL   Immature Granulocytes 1 %   Abs Immature Granulocytes 0.1 0.0 - 0.1 K/uL    Comment: Performed at [facility], [place] [address] [place], [place] [address]  I-Stat CG4 Lactic Acid, ED     Status: Abnormal   Collection Time: 10/16/17  6:49 PM  Result Value Ref Range   Lactic Acid, Venous 1.96 (H) 0.5 - 1.9 mmol/L  Blood culture (routine x 2)     Status: None   Collection Time: 10/16/17  9:50 PM  Result Value Ref Range   Specimen Description BLOOD RIGHT HAND    Special Requests      BOTTLES DRAWN AEROBIC AND ANAEROBIC Blood Culture adequate volume   Culture      NO GROWTH 5 DAYS Performed at [facility], [place] [address], [place], [place] [address]    Report Status 10/21/2017 FINAL   Sedimentation rate     Status: Abnormal   Collection Time: 10/16/17  9:50 PM  Result  Value Ref Range   Sed Rate 83 (H) 0 - 16 mm/hr    Comment: Performed at [facility] [address], [place], [place] [address]  C-reactive protein     Status: Abnormal   Collection Time: 10/16/17  9:50 PM  Result Value Ref Range   CRP 19.1 (H) <1.0 mg/dL  Comment: Performed at [facility], [place] [address], [place], [place] [address]  Blood culture (routine x 2)     Status: None   Collection Time: 10/16/17  9:55 PM  Result Value Ref Range   Specimen Description BLOOD LEFT ARM    Special Requests      BOTTLES DRAWN AEROBIC AND ANAEROBIC Blood Culture adequate volume   Culture      NO GROWTH 5 DAYS Performed at [facility], [place] [address] [place], [place] [address]    Report Status 10/21/2017 FINAL   I-Stat CG4 Lactic Acid, ED     Status: Abnormal   Collection Time: 10/16/17  9:57 PM  Result Value Ref Range   Lactic Acid, Venous 2.17 (HH) 0.5 - 1.9 mmol/L   Comment NOTIFIED PHYSICIAN   Hemoglobin A1c     Status: Abnormal   Collection Time: 10/16/17 10:49 PM  Result Value Ref Range   Hgb A1c MFr Bld 7.0 (H) 4.8 - 5.6 %    Comment: (NOTE) Pre diabetes:          5.7%-6.4% Diabetes:              >6.4% Glycemic control for   <7.0% adults with diabetes    Mean Plasma Glucose 154.2 mg/dL    Comment: Performed at [facility] [address] [place], [place] [address]  HIV antibody     Status: None   Collection Time: 10/16/17 10:49 PM  Result Value Ref Range   HIV Screen 4th Generation wRfx Non Reactive Non Reactive    Comment: (NOTE) Performed At: [facility] [place], [place] [id number] Rush Farmer MD ID:[id number]   Prealbumin     Status: Abnormal   Collection Time: 10/16/17 10:49 PM  Result Value Ref Range   Prealbumin 9.0 (L) 18 - 38 mg/dL    Comment: Performed at [facility], [place] [address], [place], [place] [address]  Glucose, capillary     Status: None   Collection Time: 10/17/17  7:59 AM  Result Value  Ref Range   Glucose-Capillary 94 70 - 99 mg/dL  Glucose, capillary     Status: Abnormal   Collection Time: 10/17/17 12:32 PM  Result Value Ref Range   Glucose-Capillary 141 (H) 70 - 99 mg/dL  Glucose, capillary     Status: Abnormal   Collection Time: 10/17/17  4:33 PM  Result Value Ref Range   Glucose-Capillary 139 (H) 70 - 99 mg/dL  Glucose, capillary     Status: Abnormal   Collection Time: 10/17/17 10:12 PM  Result Value Ref Range   Glucose-Capillary 150 (H) 70 - 99 mg/dL  CBC     Status: Abnormal   Collection Time: 10/18/17  5:20 AM  Result Value Ref Range   WBC 12.5 (H) 4.0 - 10.5 K/uL   RBC 3.64 (L) 4.22 - 5.81 MIL/uL   Hemoglobin 10.3 (L) 13.0 - 17.0 g/dL   HCT 32.0 (L) 39.0 - 52.0 %   MCV 87.9 78.0 - 100.0 fL   MCH 28.3 26.0 - 34.0 pg   MCHC 32.2 30.0 - 36.0 g/dL   RDW 14.6 11.5 - 15.5 %   Platelets 554 (H) 150 - 400 K/uL    Comment: Performed at [facility], [place] [address], [place], [place] [address]  Basic metabolic panel     Status: Abnormal   Collection Time: 10/18/17  5:20 AM  Result Value Ref Range  Sodium 132 (L) 135 - 145 mmol/L   Potassium 5.3 (H) 3.5 - 5.1 mmol/L   Chloride 104 98 - 111 mmol/L   CO2 20 (L) 22 - 32 mmol/L   Glucose, Bld 123 (H) 70 - 99 mg/dL   BUN 11 8 - 23 mg/dL   Creatinine, Ser 0.99 0.61 - 1.24 mg/dL   Calcium 8.4 (L) 8.9 - 10.3 mg/dL   GFR calc non Af Amer >60 >60 mL/min   GFR calc Af Amer >60 >60 mL/min    Comment: (NOTE) The eGFR has been calculated using the CKD EPI equation. This calculation has not been validated in all clinical situations. eGFR's persistently <60 mL/min signify possible Chronic Kidney Disease.    Anion gap 8 5 - 15    Comment: Performed at [facility] [address], [place], [place] [address]  Magnesium     Status: None   Collection Time: 10/18/17  5:20 AM  Result Value Ref Range   Magnesium 1.9 1.7 - 2.4 mg/dL    Comment: Performed at [facility] [address] [place],  [place] [address]  Glucose, capillary     Status: Abnormal   Collection Time: 10/18/17  7:44 AM  Result Value Ref Range   Glucose-Capillary 150 (H) 70 - 99 mg/dL  Glucose, capillary     Status: Abnormal   Collection Time: 10/18/17 11:42 AM  Result Value Ref Range   Glucose-Capillary 139 (H) 70 - 99 mg/dL  Glucose, capillary     Status: Abnormal   Collection Time: 10/18/17  4:29 PM  Result Value Ref Range   Glucose-Capillary 228 (H) 70 - 99 mg/dL  Glucose, capillary     Status: Abnormal   Collection Time: 10/18/17  9:17 PM  Result Value Ref Range   Glucose-Capillary 179 (H) 70 - 99 mg/dL  Basic metabolic panel     Status: Abnormal   Collection Time: 10/19/17  3:24 AM  Result Value Ref Range   Sodium 130 (L) 135 - 145 mmol/L   Potassium 4.2 3.5 - 5.1 mmol/L    Comment: DELTA CHECK NOTED   Chloride 101 98 - 111 mmol/L   CO2 19 (L) 22 - 32 mmol/L   Glucose, Bld 167 (H) 70 - 99 mg/dL   BUN 7 (L) 8 - 23 mg/dL   Creatinine, Ser 0.91 0.61 - 1.24 mg/dL   Calcium 8.5 (L) 8.9 - 10.3 mg/dL   GFR calc non Af Amer >60 >60 mL/min   GFR calc Af Amer >60 >60 mL/min    Comment: (NOTE) The eGFR has been calculated using the CKD EPI equation. This calculation has not been validated in all clinical situations. eGFR's persistently <60 mL/min signify possible Chronic Kidney Disease.    Anion gap 10 5 - 15    Comment: Performed at [facility] [address], [place], [place] [address]  Glucose, capillary     Status: Abnormal   Collection Time: 10/19/17  8:08 AM  Result Value Ref Range   Glucose-Capillary 151 (H) 70 - 99 mg/dL  Glucose, capillary     Status: Abnormal   Collection Time: 10/19/17 11:25 AM  Result Value Ref Range   Glucose-Capillary 149 (H) 70 - 99 mg/dL  POCT Activated clotting time     Status: None   Collection Time: 10/19/17  1:54 PM  Result Value Ref Range   Activated Clotting Time 252 seconds  POCT Activated clotting time     Status:  None   Collection Time: 10/19/17  2:46  PM  Result Value Ref Range   Activated Clotting Time 197 seconds  Glucose, capillary     Status: Abnormal   Collection Time: 10/19/17  6:33 PM  Result Value Ref Range   Glucose-Capillary 176 (H) 70 - 99 mg/dL  Glucose, capillary     Status: Abnormal   Collection Time: 10/19/17  9:36 PM  Result Value Ref Range   Glucose-Capillary 244 (H) 70 - 99 mg/dL  CBC     Status: Abnormal   Collection Time: 10/20/17  3:08 AM  Result Value Ref Range   WBC 13.1 (H) 4.0 - 10.5 K/uL   RBC 3.34 (L) 4.22 - 5.81 MIL/uL   Hemoglobin 9.3 (L) 13.0 - 17.0 g/dL   HCT 29.2 (L) 39.0 - 52.0 %   MCV 87.4 78.0 - 100.0 fL   MCH 27.8 26.0 - 34.0 pg   MCHC 31.8 30.0 - 36.0 g/dL   RDW 14.6 11.5 - 15.5 %   Platelets 519 (H) 150 - 400 K/uL    Comment: Performed at [facility], [place] [address], [place], [place] [address]  Basic metabolic panel     Status: Abnormal   Collection Time: 10/20/17  3:08 AM  Result Value Ref Range   Sodium 131 (L) 135 - 145 mmol/L   Potassium 4.1 3.5 - 5.1 mmol/L   Chloride 102 98 - 111 mmol/L   CO2 21 (L) 22 - 32 mmol/L   Glucose, Bld 187 (H) 70 - 99 mg/dL   BUN 9 8 - 23 mg/dL   Creatinine, Ser 0.88 0.61 - 1.24 mg/dL   Calcium 8.4 (L) 8.9 - 10.3 mg/dL   GFR calc non Af Amer >60 >60 mL/min   GFR calc Af Amer >60 >60 mL/min    Comment: (NOTE) The eGFR has been calculated using the CKD EPI equation. This calculation has not been validated in all clinical situations. eGFR's persistently <60 mL/min signify possible Chronic Kidney Disease.    Anion gap 8 5 - 15    Comment: Performed at [facility] [address], [place], [place] [address]  Magnesium     Status: None   Collection Time: 10/20/17  3:08 AM  Result Value Ref Range   Magnesium 1.8 1.7 - 2.4 mg/dL    Comment: Performed at [facility] [address], [place], [place] [address]  Glucose, capillary     Status: Abnormal   Collection Time: 10/20/17  6:18 AM  Result Value Ref Range   Glucose-Capillary  160 (H) 70 - 99 mg/dL  Glucose, capillary     Status: Abnormal   Collection Time: 10/20/17  8:22 AM  Result Value Ref Range   Glucose-Capillary 107 (H) 70 - 99 mg/dL  Glucose, capillary     Status: Abnormal   Collection Time: 10/20/17 11:42 AM  Result Value Ref Range   Glucose-Capillary 158 (H) 70 - 99 mg/dL  Glucose, capillary     Status: Abnormal   Collection Time: 10/20/17  1:58 PM  Result Value Ref Range   Glucose-Capillary 126 (H) 70 - 99 mg/dL  Glucose, capillary     Status: Abnormal   Collection Time: 10/20/17  3:38 PM  Result Value Ref Range   Glucose-Capillary 117 (H) 70 - 99 mg/dL   Comment 1 Notify RN   Glucose, capillary     Status: Abnormal   Collection Time: 10/20/17  5:23 PM  Result Value Ref  Range   Glucose-Capillary 166 (H) 70 - 99 mg/dL  Glucose, capillary     Status: Abnormal   Collection Time: 10/20/17  9:23 PM  Result Value Ref Range   Glucose-Capillary 305 (H) 70 - 99 mg/dL  Basic metabolic panel     Status: Abnormal   Collection Time: 10/21/17  3:14 AM  Result Value Ref Range   Sodium 131 (L) 135 - 145 mmol/L   Potassium 4.6 3.5 - 5.1 mmol/L   Chloride 103 98 - 111 mmol/L   CO2 22 22 - 32 mmol/L   Glucose, Bld 341 (H) 70 - 99 mg/dL   BUN 13 8 - 23 mg/dL   Creatinine, Ser 1.00 0.61 - 1.24 mg/dL   Calcium 8.2 (L) 8.9 - 10.3 mg/dL   GFR calc non Af Amer >60 >60 mL/min   GFR calc Af Amer >60 >60 mL/min    Comment: (NOTE) The eGFR has been calculated using the CKD EPI equation. This calculation has not been validated in all clinical situations. eGFR's persistently <60 mL/min signify possible Chronic Kidney Disease.    Anion gap 6 5 - 15    Comment: Performed at [facility] [address], [place], [place] [address]  CBC     Status: Abnormal   Collection Time: 10/21/17  3:14 AM  Result Value Ref Range   WBC 14.7 (H) 4.0 - 10.5 K/uL   RBC 2.84 (L) 4.22 - 5.81 MIL/uL   Hemoglobin 8.0 (L) 13.0 - 17.0 g/dL   HCT 24.7 (L) 39.0 - 52.0 %   MCV  87.0 78.0 - 100.0 fL   MCH 28.2 26.0 - 34.0 pg   MCHC 32.4 30.0 - 36.0 g/dL   RDW 14.6 11.5 - 15.5 %   Platelets 486 (H) 150 - 400 K/uL    Comment: Performed at [facility], [place] [address], [place], [place] [address]  Glucose, capillary     Status: Abnormal   Collection Time: 10/21/17  6:09 AM  Result Value Ref Range   Glucose-Capillary 263 (H) 70 - 99 mg/dL  Basic metabolic panel     Status: Abnormal   Collection Time: 10/21/17  8:44 AM  Result Value Ref Range   Sodium 135 135 - 145 mmol/L   Potassium 4.3 3.5 - 5.1 mmol/L   Chloride 104 98 - 111 mmol/L   CO2 22 22 - 32 mmol/L   Glucose, Bld 200 (H) 70 - 99 mg/dL   BUN 11 8 - 23 mg/dL   Creatinine, Ser 1.07 0.61 - 1.24 mg/dL   Calcium 8.4 (L) 8.9 - 10.3 mg/dL   GFR calc non Af Amer >60 >60 mL/min   GFR calc Af Amer >60 >60 mL/min    Comment: (NOTE) The eGFR has been calculated using the CKD EPI equation. This calculation has not been validated in all clinical situations. eGFR's persistently <60 mL/min signify possible Chronic Kidney Disease.    Anion gap 9 5 - 15    Comment: Performed at [facility] [address], [place], [place] [address]  Glucose, capillary     Status: Abnormal   Collection Time: 10/21/17 11:43 AM  Result Value Ref Range   Glucose-Capillary 321 (H) 70 - 99 mg/dL  Glucose, capillary     Status: Abnormal   Collection Time: 10/21/17  4:18 PM  Result Value Ref Range   Glucose-Capillary 255 (H) 70 - 99 mg/dL  Glucose, capillary     Status: Abnormal  Collection Time: 10/21/17  9:01 PM  Result Value Ref Range   Glucose-Capillary 261 (H) 70 - 99 mg/dL  Vancomycin, trough     Status: Abnormal   Collection Time: 10/21/17 10:14 PM  Result Value Ref Range   Vancomycin Tr 12 (L) 15 - 20 ug/mL    Comment: Performed at [facility], [place] [address], [place], [place] [address]  CBC with Differential/Platelet     Status: Abnormal   Collection Time: 10/22/17  5:14 AM  Result Value Ref Range    WBC 11.7 (H) 4.0 - 10.5 K/uL   RBC 2.21 (L) 4.22 - 5.81 MIL/uL   Hemoglobin 6.2 (LL) 13.0 - 17.0 g/dL    Comment: REPEATED TO VERIFY DELTA CHECK NOTED CRITICAL RESULT CALLED TO, READ BACK BY AND VERIFIED WITH: Mardelle Matte [id number] 10/22/2017 T. TYSOR RESULTS VERIFIED VIA RECOLLECT    HCT 19.1 (L) 39.0 - 52.0 %   MCV 86.4 78.0 - 100.0 fL   MCH 28.1 26.0 - 34.0 pg   MCHC 32.5 30.0 - 36.0 g/dL   RDW 14.6 11.5 - 15.5 %   Platelets 422 (H) 150 - 400 K/uL   Neutrophils Relative % 67 %   Neutro Abs 8.0 (H) 1.7 - 7.7 K/uL   Lymphocytes Relative 18 %   Lymphs Abs 2.1 0.7 - 4.0 K/uL   Monocytes Relative 8 %   Monocytes Absolute 0.9 0.1 - 1.0 K/uL   Eosinophils Relative 5 %   Eosinophils Absolute 0.6 0.0 - 0.7 K/uL   Basophils Relative 1 %   Basophils Absolute 0.1 0.0 - 0.1 K/uL   Immature Granulocytes 1 %   Abs Immature Granulocytes 0.1 0.0 - 0.1 K/uL    Comment: Performed at [facility], [address], [place], [place] [address]  Glucose, capillary     Status: Abnormal   Collection Time: 10/22/17  6:32 AM  Result Value Ref Range   Glucose-Capillary 180 (H) 70 - 99 mg/dL  Type and screen [place]     Status: None   Collection Time: 10/22/17  7:00 AM  Result Value Ref Range   ABO/RH(D) A POS    Antibody Screen NEG    Sample Expiration 10/25/2017    Unit Number [id number]    Blood Component Type RED CELLS,LR    Unit division 00    Status of Unit ISSUED,FINAL    Transfusion Status OK TO TRANSFUSE    Crossmatch Result Compatible    Unit Number [id number]    Blood Component Type RED CELLS,LR    Unit division 00    Status of Unit REL FROM [facility]    Transfusion Status OK TO TRANSFUSE    Crossmatch Result      Compatible Performed at [facility], [place] [address], [place], [place] [address]   Prepare RBC     Status: None   Collection Time: 10/22/17  7:00 AM  Result Value Ref Range   Order Confirmation      ORDER PROCESSED BY BLOOD BANK Performed  at [facility], [place] [address], [place],  [address]   BPAM RBC     Status: None   Collection Time: 10/22/17  7:00 AM  Result Value Ref Range   ISSUE DATE / TIME [id number]    Blood Product Unit Number [id number]    PRODUCT CODE [id number]    Unit Type and Rh 2103    Blood Product Expiration Date 128118867737  Blood Product Unit Number [id number]    Unit Type and Rh 6200    Blood Product Expiration Date 950932671245   Glucose, capillary     Status: Abnormal   Collection Time: 10/22/17 10:55 AM  Result Value Ref Range   Glucose-Capillary 261 (H) 70 - 99 mg/dL   Comment 1 Notify RN    Comment 2 Document in Chart   Occult blood card to lab, stool     Status: None   Collection Time: 10/22/17  3:36 PM  Result Value Ref Range   Fecal Occult Bld NEGATIVE NEGATIVE    Comment: Performed at [facility], [place] [address], [place], [place] [address]  Glucose, capillary     Status: Abnormal   Collection Time: 10/22/17  4:01 PM  Result Value Ref Range   Glucose-Capillary 225 (H) 70 - 99 mg/dL   Comment 1 Notify RN    Comment 2 Document in Chart   Hemoglobin and hematocrit, blood     Status: Abnormal   Collection Time: 10/22/17  6:08 PM  Result Value Ref Range   Hemoglobin 8.8 (L) 13.0 - 17.0 g/dL    Comment: REPEATED TO VERIFY   HCT 27.2 (L) 39.0 - 52.0 %    Comment: Performed at [facility] [address], [place], [place] [address]  Glucose, capillary     Status: Abnormal   Collection Time: 10/22/17  9:38 PM  Result Value Ref Range   Glucose-Capillary 215 (H) 70 - 99 mg/dL  CBC with Differential/Platelet     Status: Abnormal   Collection Time: 10/23/17  3:14 AM  Result Value Ref Range   WBC 13.3 (H) 4.0 - 10.5 K/uL   RBC 2.73 (L) 4.22 - 5.81 MIL/uL   Hemoglobin 7.7 (L) 13.0 - 17.0 g/dL   HCT 23.7 (L) 39.0 - 52.0 %   MCV 86.8 78.0 - 100.0 fL   MCH 28.2 26.0 - 34.0 pg   MCHC 32.5 30.0 - 36.0 g/dL   RDW 14.6 11.5 - 15.5 %   Platelets 496 (H) 150  - 400 K/uL   Neutrophils Relative % 70 %   Neutro Abs 9.2 (H) 1.7 - 7.7 K/uL   Lymphocytes Relative 16 %   Lymphs Abs 2.2 0.7 - 4.0 K/uL   Monocytes Relative 7 %   Monocytes Absolute 1.0 0.1 - 1.0 K/uL   Eosinophils Relative 6 %   Eosinophils Absolute 0.8 (H) 0.0 - 0.7 K/uL   Basophils Relative 0 %   Basophils Absolute 0.1 0.0 - 0.1 K/uL   Immature Granulocytes 1 %   Abs Immature Granulocytes 0.2 (H) 0.0 - 0.1 K/uL    Comment: Performed at [facility], [address] [place], [place] [address]  Basic metabolic panel     Status: Abnormal   Collection Time: 10/23/17  3:14 AM  Result Value Ref Range   Sodium 136 135 - 145 mmol/L   Potassium 4.2 3.5 - 5.1 mmol/L   Chloride 108 98 - 111 mmol/L   CO2 22 22 - 32 mmol/L   Glucose, Bld 182 (H) 70 - 99 mg/dL   BUN 8 8 - 23 mg/dL   Creatinine, Ser 0.83 0.61 - 1.24 mg/dL   Calcium 8.0 (L) 8.9 - 10.3 mg/dL   GFR calc non Af Amer >60 >60 mL/min   GFR calc Af Amer >60 >60 mL/min    Comment: (NOTE) The eGFR has been calculated using the CKD EPI equation. This calculation  has not been validated in all clinical situations. eGFR's persistently <60 mL/min signify possible Chronic Kidney Disease.    Anion gap 6 5 - 15    Comment: Performed at [facility] [address] [place], [place] [address]  Glucose, capillary     Status: Abnormal   Collection Time: 10/23/17  6:32 AM  Result Value Ref Range   Glucose-Capillary 158 (H) 70 - 99 mg/dL  Glucose, capillary     Status: Abnormal   Collection Time: 10/23/17 11:09 AM  Result Value Ref Range   Glucose-Capillary 319 (H) 70 - 99 mg/dL   Comment 1 Notify RN    Comment 2 Document in Chart   Glucose, capillary     Status: Abnormal   Collection Time: 10/23/17  3:57 PM  Result Value Ref Range   Glucose-Capillary 316 (H) 70 - 99 mg/dL   Comment 1 Notify RN    Comment 2 Document in Chart   Glucose, capillary     Status: None   Collection Time: 10/23/17  8:30 PM  Result Value Ref  Range   Glucose-Capillary 93 70 - 99 mg/dL  Glucose, capillary     Status: Abnormal   Collection Time: 10/24/17  6:03 AM  Result Value Ref Range   Glucose-Capillary 183 (H) 70 - 99 mg/dL  Glucose, capillary     Status: Abnormal   Collection Time: 10/24/17 11:25 AM  Result Value Ref Range   Glucose-Capillary 139 (H) 70 - 99 mg/dL  CBC     Status: Abnormal   Collection Time: 10/24/17  1:47 PM  Result Value Ref Range   WBC 11.8 (H) 4.0 - 10.5 K/uL   RBC 2.76 (L) 4.22 - 5.81 MIL/uL   Hemoglobin 8.0 (L) 13.0 - 17.0 g/dL   HCT 24.5 (L) 39.0 - 52.0 %   MCV 88.8 78.0 - 100.0 fL   MCH 29.0 26.0 - 34.0 pg   MCHC 32.7 30.0 - 36.0 g/dL   RDW 15.3 11.5 - 15.5 %   Platelets 498 (H) 150 - 400 K/uL    Comment: Performed at [facility], [place]. [address], [place], [place] [address]  Glucose, capillary     Status: Abnormal   Collection Time: 10/24/17  4:16 PM  Result Value Ref Range   Glucose-Capillary 330 (H) 70 - 99 mg/dL  Glucose, capillary     Status: Abnormal   Collection Time: 10/24/17  9:48 PM  Result Value Ref Range   Glucose-Capillary 281 (H) 70 - 99 mg/dL   Comment 1 Notify RN    Comment 2 Document in Chart   CBC with Differential/Platelet     Status: Abnormal   Collection Time: 10/25/17  3:41 AM  Result Value Ref Range   WBC 12.1 (H) 4.0 - 10.5 K/uL   RBC 2.94 (L) 4.22 - 5.81 MIL/uL   Hemoglobin 8.4 (L) 13.0 - 17.0 g/dL   HCT 26.1 (L) 39.0 - 52.0 %   MCV 88.8 78.0 - 100.0 fL   MCH 28.6 26.0 - 34.0 pg   MCHC 32.2 30.0 - 36.0 g/dL   RDW 15.6 (H) 11.5 - 15.5 %   Platelets 563 (H) 150 - 400 K/uL   Neutrophils Relative % 63 %   Neutro Abs 7.7 1.7 - 7.7 K/uL   Lymphocytes Relative 19 %   Lymphs Abs 2.3 0.7 - 4.0 K/uL   Monocytes Relative 9 %   Monocytes Absolute 1.1 (H) 0.1 - 1.0 K/uL   Eosinophils  Relative 7 %   Eosinophils Absolute 0.8 (H) 0.0 - 0.7 K/uL   Basophils Relative 1 %   Basophils Absolute 0.1 0.0 - 0.1 K/uL   Immature Granulocytes 1 %   Abs Immature  Granulocytes 0.2 (H) 0.0 - 0.1 K/uL    Comment: Performed at [facility] [address], [place], [place] [address]  Glucose, capillary     Status: Abnormal   Collection Time: 10/25/17  6:21 AM  Result Value Ref Range   Glucose-Capillary 221 (H) 70 - 99 mg/dL   Comment 1 Notify RN    Comment 2 Document in Chart   Glucose, capillary     Status: Abnormal   Collection Time: 10/25/17 11:07 AM  Result Value Ref Range   Glucose-Capillary 217 (H) 70 - 99 mg/dL    Objective: General: Patient is awake, alert, and oriented x 3 and in no acute distress.  Integument: Skin is cool, dry and supple bilateral. Nails are tender, long, thickened and  dystrophic with subungual debris, consistent with onychomycosis, 1-5 on left and 2 3 and 5 on right no signs of infection.  There is an open amputation wound to the right foot at the area of previous hallux amputation that has a fibro-granular base there is no active drainage no warmth redness erythema or acute signs of infection this wound is currently being managed by vascular, remaining integument unremarkable.  Vasculature:  Dorsalis Pedis pulse 1/4 bilateral. Posterior Tibial pulse  0/4 bilateral.  No pedal hair growth.  Neurology: Protective sensation absent bilateral.   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 No tenderness with calf compression bilateral.  Assessment and Plan: Problem List Items Addressed This Visit      Endocrine   Uncontrolled type 2 diabetes mellitus with peripheral neuropathy ([place])    Other Visit Diagnoses    Pain due to onychomycosis of nail    -  Primary   PAD (peripheral artery disease) (HCC)       History of amputation of hallux (HCC)       Open amputation wound      -Examined patient. -Discussed and educated patient on diabetic foot care, especially with  regards to the vascular, neurological and musculoskeletal systems.  -Mechanically debrided all nails  1-5 on left and 235 on right using sterile nail nipper and filed with dremel without incident however all nails were not as terribly long as some of them on the left foot were -Advised patient to continue with vascular follow-up for wound care and would recommend if this wound is not improving to refer patient to wound care center -Answered all patient questions -Patient to return  in 3 months for continued nail care -Patient advised to call the office if any problems or questions arise in the meantime.  Landis Martins, DPM

## 2017-12-09 ENCOUNTER — Other Ambulatory Visit: Payer: Self-pay

## 2017-12-09 ENCOUNTER — Ambulatory Visit (INDEPENDENT_AMBULATORY_CARE_PROVIDER_SITE_OTHER)
Admission: RE | Admit: 2017-12-09 | Discharge: 2017-12-09 | Disposition: A | Discharge status: Home / Self Care | Payer: Medicare Other | Source: Ambulatory Visit | Attending: Vascular Surgery | Admitting: Vascular Surgery

## 2017-12-09 ENCOUNTER — Ambulatory Visit (INDEPENDENT_AMBULATORY_CARE_PROVIDER_SITE_OTHER): Payer: Medicare Other | Admitting: Physician Assistant

## 2017-12-09 ENCOUNTER — Ambulatory Visit (HOSPITAL_COMMUNITY)
Admission: RE | Admit: 2017-12-09 | Discharge: 2017-12-09 | Disposition: A | Discharge status: Home / Self Care | Payer: Medicare Other | Source: Ambulatory Visit | Attending: Vascular Surgery | Admitting: Vascular Surgery

## 2017-12-09 VITALS — BP 98/68 | HR 69 | Temp 96.8°F | Resp 16 | Ht 69.5 in | Wt 122.0 lb

## 2017-12-09 DIAGNOSIS — I739 Peripheral vascular disease, unspecified: Secondary | ICD-10-CM

## 2017-12-09 DIAGNOSIS — L97511 Non-pressure chronic ulcer of other part of right foot limited to breakdown of skin: Secondary | ICD-10-CM | POA: Insufficient documentation

## 2017-12-09 NOTE — Progress Notes (Signed)
  POST OPERATIVE OFFICE NOTE    CC:  F/u for surgery  HPI:  This is a 77 y.o. male who is s/p endovascular intervention of his right lower extremity distal anastomosis from a previous right femoral to below-knee popliteal bypass as well as tenting of his right common iliac artery on 10/19/17.  He is ultimately underwent amputation of his right great toe on 10/20/17.  He presents today for follow up studies and wound check.  Pt states he is doing well and that his toe amp site is healing.  He denies any fevers.   He states he is still living in the nursing home and wants to go home.    He is taking asa/plavix/statin daily.    No Known Allergies  Current Outpatient Medications  Medication Sig Dispense Refill  . acetaminophen (TYLENOL) 500 MG tablet Take 1,000-1,500 mg by mouth daily as needed for mild pain.    Marland Kitchen aspirin EC 81 MG tablet Take 81 mg by mouth daily.    . clopidogrel (PLAVIX) 75 MG tablet Take 1 tablet (75 mg total) by mouth daily with breakfast. 30 tablet 0  . fenofibrate 160 MG tablet Take 160 mg by mouth daily.    Marland Kitchen glipiZIDE (GLUCOTROL) 10 MG tablet Take 10 mg by mouth 2 (two) times daily before a meal.    . HYDROcodone-acetaminophen (NORCO/VICODIN) 5-325 MG tablet Take 1 tablet by mouth every 6 (six) hours as needed for moderate pain. 20 tablet 0  . magnesium oxide (MAG-OX) 400 (241.3 Mg) MG tablet Take 1 tablet (400 mg total) by mouth 2 (two) times daily. 40 tablet 0  . metFORMIN (GLUCOPHAGE) 1000 MG tablet TAKE ONE TABLET BY MOUTH TWICE DAILY FOR  DIABETES (Patient taking differently: Take 1,000 mg by mouth 2 (two) times daily with a meal. ) 60 tablet 0  . metoprolol tartrate (LOPRESSOR) 25 MG tablet Take 12.5 mg by mouth 2 (two) times daily.    . simvastatin (ZOCOR) 40 MG tablet Take 1 tablet (40 mg total) by mouth at bedtime. 30 tablet 0   No current facility-administered medications for this visit.      ROS:  See HPI  Physical Exam:  Vitals:   12/09/17 1407  BP:  98/68  Pulse: 69  Resp: 16  Temp: (!) 96.8 F (36 C)    Incision:  Left groin without hematoma.    Lower extremity doppler study 12/09/17: Right ABI:  0.77 (previously absent) Left ABI:  0.42 (previous 0.500  Lower extremity arterial duplex study 12/09/17: Right:  Normal exam.  No evidence of arterial occlusive dz within the bypass graft.  Poor visualization of the distal anastomosis and outflow due to poor pt mobility   Assessment/Plan:  This is a 77 y.o. male who is s/p: endovascular intervention of his right lower extremity distal anastomosis from a previous right femoral to below-knee popliteal bypass as well as tenting of his right common iliac artery on 10/19/17.  He is ultimately underwent amputation of his right great toe on 10/20/17.  -pt's right great toe wound is healing.  His ABI is much improved and bypass graft is patent.  Continue current wound care.   Will have him f/u in 4 weeks with PA on  Dr. Darcella Cheshire clinic day.     Doreatha Massed, PA-C [facility] [phone]  Clinic MD:  Darrick Penna

## 2018-01-06 ENCOUNTER — Ambulatory Visit: Payer: Medicare Other

## 2018-01-08 ENCOUNTER — Ambulatory Visit: Payer: Medicare Other

## 2018-01-08 NOTE — Progress Notes (Deleted)
  POST OPERATIVE OFFICE NOTE    CC:  F/u for surgery  NWG:NFAOHPI:This is a 77 y.o. male who is s/p endovascular intervention of his right lower extremity distal anastomosis from a previous right femoral to below-knee popliteal bypass as well as tenting of his right common iliac artery on 10/19/17. He is ultimately underwent amputation of his right great toe on 10/20/17.  He is here today for a wound check.  He denise fever or chills.   Lower extremity doppler study 12/09/17: Right ABI:  0.77 (previously absent) Left ABI:  0.42 (previous 0.500  No Known Allergies  Current Outpatient Medications  Medication Sig Dispense Refill  . acetaminophen (TYLENOL) 500 MG tablet Take 1,000-1,500 mg by mouth daily as needed for mild pain.    Marland Kitchen. aspirin EC 81 MG tablet Take 81 mg by mouth daily.    . clopidogrel (PLAVIX) 75 MG tablet Take 1 tablet (75 mg total) by mouth daily with breakfast. 30 tablet 0  . fenofibrate 160 MG tablet Take 160 mg by mouth daily.    Marland Kitchen. glipiZIDE (GLUCOTROL) 10 MG tablet Take 10 mg by mouth 2 (two) times daily before a meal.    . HYDROcodone-acetaminophen (NORCO/VICODIN) 5-325 MG tablet Take 1 tablet by mouth every 6 (six) hours as needed for moderate pain. 20 tablet 0  . magnesium oxide (MAG-OX) 400 (241.3 Mg) MG tablet Take 1 tablet (400 mg total) by mouth 2 (two) times daily. 40 tablet 0  . metFORMIN (GLUCOPHAGE) 1000 MG tablet TAKE ONE TABLET BY MOUTH TWICE DAILY FOR  DIABETES (Patient taking differently: Take 1,000 mg by mouth 2 (two) times daily with a meal. ) 60 tablet 0  . metoprolol tartrate (LOPRESSOR) 25 MG tablet Take 12.5 mg by mouth 2 (two) times daily.    . simvastatin (ZOCOR) 40 MG tablet Take 1 tablet (40 mg total) by mouth at bedtime. 30 tablet 0   No current facility-administered medications for this visit.      ROS:  See HPI  Physical Exam:  There were no vitals filed for this visit.  Incision:  *** Extremities:  *** Neuro: *** Abdomen:   ***  Assessment/Plan:  This is a 77 y.o. male who is s/p: ***  -***  Mosetta PigeonEmma Maureen Pritesh Sobecki PA-C [facility] [phone]  Clinic MD:  ***

## 2018-01-11 ENCOUNTER — Encounter: Payer: Self-pay | Admitting: General Practice

## 2018-02-05 ENCOUNTER — Other Ambulatory Visit: Payer: Self-pay | Admitting: *Deleted

## 2018-02-05 DIAGNOSIS — I739 Peripheral vascular disease, unspecified: Secondary | ICD-10-CM

## 2018-02-05 MED ORDER — CLOPIDOGREL BISULFATE 75 MG PO TABS: 75.0000 mg | ORAL_TABLET | Freq: Every day | ORAL | 11 refills | Status: DC

## 2018-03-09 ENCOUNTER — Ambulatory Visit: Payer: Medicare Other | Admitting: Sports Medicine

## 2019-02-27 ENCOUNTER — Other Ambulatory Visit: Payer: Self-pay | Admitting: Vascular Surgery

## 2019-03-18 ENCOUNTER — Encounter (HOSPITAL_COMMUNITY): Payer: Medicare Other

## 2019-03-18 ENCOUNTER — Ambulatory Visit: Payer: Medicare Other | Admitting: Vascular Surgery

## 2019-04-13 ENCOUNTER — Other Ambulatory Visit: Payer: Self-pay | Admitting: Vascular Surgery

## 2019-04-26 ENCOUNTER — Other Ambulatory Visit: Payer: Self-pay | Admitting: *Deleted

## 2019-04-26 DIAGNOSIS — I739 Peripheral vascular disease, unspecified: Secondary | ICD-10-CM

## 2019-04-29 ENCOUNTER — Ambulatory Visit: Payer: Medicare Other | Admitting: Vascular Surgery

## 2019-04-29 ENCOUNTER — Encounter (HOSPITAL_COMMUNITY): Payer: Medicare Other

## 2019-04-29 ENCOUNTER — Ambulatory Visit (HOSPITAL_COMMUNITY)
Admission: RE | Admit: 2019-04-29 | Discharge: 2019-04-29 | Disposition: A | Discharge status: Home / Self Care | Payer: Medicare Other | Source: Ambulatory Visit | Attending: Vascular Surgery | Admitting: Vascular Surgery

## 2019-04-29 ENCOUNTER — Other Ambulatory Visit: Payer: Self-pay

## 2019-04-29 ENCOUNTER — Ambulatory Visit (INDEPENDENT_AMBULATORY_CARE_PROVIDER_SITE_OTHER): Payer: Medicare Other | Admitting: Vascular Surgery

## 2019-04-29 ENCOUNTER — Encounter: Payer: Self-pay | Admitting: Vascular Surgery

## 2019-04-29 VITALS — BP 141/78 | HR 74 | Temp 97.9°F | Resp 20 | Ht 69.5 in | Wt 123.0 lb

## 2019-04-29 DIAGNOSIS — I739 Peripheral vascular disease, unspecified: Secondary | ICD-10-CM | POA: Insufficient documentation

## 2019-04-29 NOTE — Progress Notes (Signed)
Patient ID: Daniel Tyler, male   DOB: 07/21/1940, 79 y.o.   MRN: [id number]  Reason for Consult: Follow-up   Referred by Dois Davenport, MD  Subjective:     HPI:  Daniel Tyler is a 79 y.o. male previous right femoropopliteal bypass and surgical intervention of the distal anastomosis as well as stenting of the right common iliac artery in August 2019.  Previously had a fourth toe amputation and then subsequent underwent a first toe amputation.  These have healed well.  He continues on aspirin Plavix and statin.  Recently fell has right hip pain has not had an x-ray was evaluated by EMS refused transport to the hospital at that time.  He does walk with help of walker.  No current tissue loss or ulceration.  Past Medical History:  Diagnosis Date  . Anemia   . Carotid stenosis   . Coronary artery disease   . Depression    bc of foot pain but no meds required  . Diabetes mellitus    takes Glipizide and Metformin daily;Lantus nightly  . Enlarged heart   . Hyperlipidemia   . Myocardial infarction (HCC) 2013  . Pneumonia 2013   hx of  . Skin spots-aging   . Weakness of right leg    Family History  Problem Relation Age of Onset  . Heart disease Father   . Cancer Brother   . Diabetes Brother   . Diabetes Daughter   . Anesthesia problems Neg Hx   . Hypotension Neg Hx   . Malignant hyperthermia Neg Hx   . Pseudochol deficiency Neg Hx    Past Surgical History:  Procedure Laterality Date  . ABDOMINAL AORTAGRAM    . ABDOMINAL AORTAGRAM N/A 07/09/2012   Procedure: ABDOMINAL Ronny Flurry;  Surgeon: Sherren Kerns, MD;  Location: [facility] CATH LAB;  Service: Cardiovascular;  Laterality: N/A;  . ABDOMINAL AORTOGRAM W/LOWER EXTREMITY N/A 10/19/2017   Procedure: ABDOMINAL AORTOGRAM W/LOWER EXTREMITY;  Surgeon: Cephus Shelling, MD;  Location: [facility] INVASIVE CV LAB;  Service: Cardiovascular;  Laterality: N/A;  . AMPUTATION Right 07/14/2012   Procedure: AMPUTATION DIGIT;  Surgeon: Sherren Kerns, MD;  Location: [facility] OR;  Service: Vascular;  Laterality: Right;  Amputation Right Fourth Toe  . AMPUTATION Right 10/20/2017   Procedure: AMPUTATION DIGIT;  Surgeon: Maeola Harman, MD;  Location: [facility] OR;  Service: Vascular;  Laterality: Right;  . CARDIAC CATHETERIZATION  2013  . CAROTID ENDARTERECTOMY  07/01/11   Right CEA  . COLONOSCOPY    . CORONARY ARTERY BYPASS GRAFT  07/01/2011   Procedure: CORONARY ARTERY BYPASS GRAFTING (CABG);  Surgeon: Loreli Slot, MD;  Location: [facility] OR;  Service: Open Heart Surgery;  Laterality: N/A;  . ENDARTERECTOMY  07/01/2011   Procedure: ENDARTERECTOMY CAROTID;  Surgeon: Sherren Kerns, MD;  Location: [facility] OR;  Service: Vascular;  Laterality: Right;  . FEMORAL-POPLITEAL BYPASS GRAFT Right 07/14/2012   Procedure: BYPASS GRAFT FEMORAL-POPLITEAL ARTERY;  Surgeon: Sherren Kerns, MD;  Location: [facility] OR;  Service: Vascular;  Laterality: Right;  Right Femoral Popliteal Bypass with Left Leg Nonreversed Greater Saphenous Vein  . INTRAOPERATIVE ARTERIOGRAM Right 07/14/2012   Procedure: INTRA OPERATIVE ARTERIOGRAM;  Surgeon: Sherren Kerns, MD;  Location: [facility] OR;  Service: Vascular;  Laterality: Right;  . LEFT HEART CATHETERIZATION WITH CORONARY ANGIOGRAM N/A 04/24/2011   Procedure: LEFT HEART CATHETERIZATION WITH CORONARY ANGIOGRAM;  Surgeon: Othella Boyer, MD;  Location: [facility] CATH LAB;  Service: Cardiovascular;  Laterality: N/A;  .  PERIPHERAL VASCULAR BALLOON ANGIOPLASTY Right 10/19/2017   Procedure: PERIPHERAL VASCULAR BALLOON ANGIOPLASTY;  Surgeon: Marty Heck, MD;  Location: [facility] CV LAB;  Service: Cardiovascular;  Laterality: Right;  peroneal  . PERIPHERAL VASCULAR INTERVENTION Right 10/19/2017   Procedure: PERIPHERAL VASCULAR INTERVENTION;  Surgeon: Marty Heck, MD;  Location: [facility] CV LAB;  Service: Cardiovascular;  Laterality: Right;  common iliac    Short Social History:  Social History   Tobacco Use  . Smoking  status: Former Smoker    Packs/day: 1.00    Years: 20.00    Pack years: 20.00    Types: Cigarettes    Quit date: 02/24/1978    Years since quitting: 41.2  . Smokeless tobacco: Never Used  Substance Use Topics  . Alcohol use: No    No Known Allergies  Current Outpatient Medications  Medication Sig Dispense Refill  . acetaminophen (TYLENOL) 500 MG tablet Take 1,000-1,500 mg by mouth daily as needed for mild pain.    Marland Kitchen aspirin EC 81 MG tablet Take 81 mg by mouth daily.    . clopidogrel (PLAVIX) 75 MG tablet Take 1 tablet (75 mg total) by mouth daily with breakfast. 30 tablet 0  . clopidogrel (PLAVIX) 75 MG tablet Take 1 tablet by mouth once daily 30 tablet 0  . fenofibrate 160 MG tablet Take 160 mg by mouth daily.    Marland Kitchen glipiZIDE (GLUCOTROL) 10 MG tablet Take 10 mg by mouth 2 (two) times daily before a meal.    . HYDROcodone-acetaminophen (NORCO/VICODIN) 5-325 MG tablet Take 1 tablet by mouth every 6 (six) hours as needed for moderate pain. 20 tablet 0  . magnesium oxide (MAG-OX) 400 (241.3 Mg) MG tablet Take 1 tablet (400 mg total) by mouth 2 (two) times daily. 40 tablet 0  . metFORMIN (GLUCOPHAGE) 1000 MG tablet TAKE ONE TABLET BY MOUTH TWICE DAILY FOR  DIABETES (Patient taking differently: Take 1,000 mg by mouth 2 (two) times daily with a meal. ) 60 tablet 0  . metoprolol tartrate (LOPRESSOR) 25 MG tablet Take 12.5 mg by mouth 2 (two) times daily.    . simvastatin (ZOCOR) 40 MG tablet Take 1 tablet (40 mg total) by mouth at bedtime. 30 tablet 0   No current facility-administered medications for this visit.    Review of Systems  Constitutional:  Constitutional negative. HENT: HENT negative.  Eyes: Eyes negative.  Cardiovascular: Cardiovascular negative.  GI: Gastrointestinal negative.  Musculoskeletal: Positive for gait problem, leg pain and joint pain.  Skin: Skin negative.  Neurological: Negative for dizziness, focal weakness and numbness.  Hematologic: Hematologic/lymphatic  negative.  Psychiatric: Psychiatric negative.        Objective:  Objective   Vitals:   04/29/19 1445  BP: (!) 141/78  Pulse: 74  Resp: 20  Temp: 97.9 F (36.6 C)  SpO2: 97%  Weight: 123 lb (55.8 kg)  Height: 5' 9.5" (1.765 m)   Body mass index is 17.9 kg/m.  Physical Exam HENT:     Head: Normocephalic.     Nose:     Comments: Mask in place Eyes:     Pupils: Pupils are equal, round, and reactive to light.  Neck:     Vascular: No carotid bruit.  Cardiovascular:     Rate and Rhythm: Regular rhythm.     Pulses:          Femoral pulses are 2+ on the right side and 2+ on the left side. Pulmonary:     Effort: Pulmonary  effort is normal.     Breath sounds: Normal breath sounds.  Abdominal:     Palpations: Abdomen is soft.  Musculoskeletal:        General: No swelling.     Cervical back: Normal range of motion and neck supple.     Comments: Well-healed right first and fourth toe amputation sites  Skin:    Capillary Refill: Capillary refill takes less than 2 seconds.  Neurological:     General: No focal deficit present.     Mental Status: He is alert.  Psychiatric:        Mood and Affect: Mood normal.        Behavior: Behavior normal.        Thought Content: Thought content normal.        Judgment: Judgment normal.     Data: I have independently interpreted his ABIs.  On the right there is 0.88 and monophasic on the left there is 0.49 relative to the DP and monophasic.  Toe pressure on the left is 52.  He has had a great toe amputation on the right.  Previous ABIs were 0.77 right and biphasic and on the left were 0.42 and monophasic.     Assessment/Plan:     79 year old male status post previous right femoropopliteal bypass.  Subsequent distal anastomotic intervention endovascularly as well as right common iliac artery stent.  He is doing well.  He is healed all of his wounds no complaints today.  We will follow-up in 1 year with right lower extremity bypass graft  duplex and ABIs.     Maeola Harman MD [facility]

## 2019-05-02 ENCOUNTER — Other Ambulatory Visit: Payer: Self-pay | Admitting: *Deleted

## 2019-05-02 DIAGNOSIS — I739 Peripheral vascular disease, unspecified: Secondary | ICD-10-CM

## 2019-05-24 ENCOUNTER — Other Ambulatory Visit: Payer: Self-pay | Admitting: Vascular Surgery

## 2019-07-04 ENCOUNTER — Other Ambulatory Visit: Payer: Self-pay | Admitting: Vascular Surgery

## 2019-08-30 ENCOUNTER — Other Ambulatory Visit: Payer: Self-pay | Admitting: Vascular Surgery

## 2019-10-08 ENCOUNTER — Other Ambulatory Visit: Payer: Self-pay | Admitting: Vascular Surgery

## 2019-11-08 ENCOUNTER — Other Ambulatory Visit: Payer: Self-pay | Admitting: Vascular Surgery

## 2019-11-21 ENCOUNTER — Other Ambulatory Visit: Payer: Self-pay

## 2019-11-21 NOTE — Patient Outreach (Signed)
  [facility]) Care Management Chronic Special Needs Program    11/21/2019  Name: BRONDON WANN, DOB: 02/11/1941  MRN: [id number]   Mr. Jacksen Hatchel is enrolled in a chronic special needs plan.  Telephone call to client for Health risk assessment completion and initial outreach call. Contact states client is not at home.  HIPAA compliant voice message left with call back phone number and return call request.  PLAN;  RNCM will attempt 2nd telephone call to client within 2 weeks.   George Ina RN,BSN,CCM Chronic Care Management Coordinator [facility] Care Management [phone]

## 2019-11-23 ENCOUNTER — Other Ambulatory Visit: Payer: Self-pay

## 2019-11-23 NOTE — Patient Outreach (Signed)
  [facility]) Care Management Chronic Special Needs Program    11/23/2019  Name: Daniel Tyler, DOB: 1940/05/09  MRN: [id number]   [name] is enrolled in a chronic special needs plan for Diabetes. Second telephone call to client for Health risk assessment completion and initial outreach. Contact answering phone states client is not at home. HIPAA compliant message left with return call number.   PLAN; RNCM will attempt 3rd telephone call to client in 2 weeks.   George Ina RN,BSN,CCM Chronic Care Management Coordinator [facility] Care Management [phone]

## 2019-12-08 ENCOUNTER — Ambulatory Visit: Payer: Self-pay

## 2019-12-09 ENCOUNTER — Other Ambulatory Visit: Payer: Self-pay

## 2019-12-09 NOTE — Patient Outreach (Addendum)
[facility]) Care Management Chronic Special Needs Program  12/09/2019  Name: Daniel Tyler DOB: 05/11/1940  MRN: [id number]  [name] is enrolled in a chronic special needs plan for Diabetes. Chronic Care Management Coordinator telephoned client to review health risk assessment and to develop individualized care plan.  Unable to reach. Contact answering phone states patient is not at home.  HIPAA compliant message left with call back phone number.  Individualized care plan completed based on available data.    Goals Addressed            This Visit's Progress   . Client understands the importance of follow-up with providers by attending scheduled visits       It is important to follow up with your providers regularly. Plan to schedule follow up appointments as recommended by your providers.     Marland Kitchen HEMOGLOBIN A1C < 7       Follow diabetes self management actions:  Glucose monitoring per provider recommendation  Check feet daily  Visit provider every 3-6 months as directed  Hbg A1C level every 3-6 months.  Eye Exam yearly  Carbohydrate controlled meal planning  Taking diabetes medication as prescribed by provider  Physical activity     . Maintain timely refills of diabetic medication as prescribed within the year .       It is important to take your medication as prescribed Contact your assigned RN case manager if you are unable to obtain your medication. Contact your doctor if you have questions or concerns regarding your medications.      . Obtain annual  Lipid Profile, LDL-C       RN case manager will send client education article: Lipid test and Heart Healthy diet. The goal for LDL is less than 70 mg/ dl as you are at high risk for complications try to avoid saturated fats, trans-fats and eat more fiber.    . Obtain Annual Eye (retinal)  Exam        Diabetes can affect your vision. Plan to have a dilated eye exam every year.  Contact your  [employer] concierge if you need a list of participating plan providers or information regarding your coverage benefit for an annual eye exam.    . Obtain Annual Foot Exam       It is important to have your feet checked regularly by your doctor.   Diabetes can affect the nerves in your feet causing decreased feeling or numbness. Contact your doctor if you have concerns regarding your feet.  Diabetes foot care - Check feet daily at home (look for skin color changes, cuts, sores or cracks in the skin, swelling of feet or ankles, ingrown or fungal toenails, corn or calluses). Report these findings to your doctor - Wash feet with soap and water, dry feet well especially between toes - Moisturize your feet but not between the toes - Always wear shoes that protect your whole feet.      . Obtain annual screen for micro albuminuria (urine) , nephropathy (kidney problems)       Diabetes can affect your kidneys. It is important to have  an micro albuminuria screening at least once a year by your doctor.  These tests show how your kidneys are working RN case manager will send client education article: Urine Albumin.     . Obtain Hemoglobin A1C at least 2 times per year       Hgb A1c is a simple  blood test that measures your average blood sugar level over the past 3 months. It is one of the test to help you and your health care team manage your diabetes.  Plan to follow up with your doctor regularly and have lab work completed as recommended.     . Visit Primary Care Provider or Endocrinologist at least 2 times per year        It is important to follow up with your providers regularly. Plan to schedule follow up appointments as recommended by your providers.  RN case manager will send client education article: Yearly physical for adults.       ASSESSMENT:   Plan:  Outreach letter sent to provider and client with individualized care plan. Education material mailed to client.  [employer] Case management team will follow member moving forward with assessments, care plans and documentation.   Chronic care management coordination will outreach in:  3 Months    George Ina RN,BSN,CCM Chronic Care Management Coordinator [facility] Care Management [phone]

## 2020-01-09 ENCOUNTER — Other Ambulatory Visit: Payer: Self-pay

## 2020-01-09 NOTE — Patient Outreach (Signed)
  [facility]) Care Management Chronic Special Needs Program    01/09/2020  Name: [name], DOB: [birth year]  MRN: [id number]   [name] is enrolled in a [facility] will continue to provide services for this member through 02/24/20.  The [employer] care management team will assume care 02/25/2020.   George Ina RN,BSN,CCM Chronic Care Management Coordinator [facility] Care Management [phone]  .

## 2020-02-12 ENCOUNTER — Other Ambulatory Visit: Payer: Self-pay

## 2020-02-12 ENCOUNTER — Encounter (HOSPITAL_BASED_OUTPATIENT_CLINIC_OR_DEPARTMENT_OTHER): Payer: Self-pay | Admitting: *Deleted

## 2020-02-12 ENCOUNTER — Emergency Department (HOSPITAL_BASED_OUTPATIENT_CLINIC_OR_DEPARTMENT_OTHER)
Admission: EM | Admit: 2020-02-12 | Discharge: 2020-02-12 | Disposition: A | Discharge status: Home / Self Care | Payer: HMO | Attending: Emergency Medicine | Admitting: Emergency Medicine

## 2020-02-12 DIAGNOSIS — Z951 Presence of aortocoronary bypass graft: Secondary | ICD-10-CM | POA: Diagnosis not present

## 2020-02-12 DIAGNOSIS — J449 Chronic obstructive pulmonary disease, unspecified: Secondary | ICD-10-CM | POA: Insufficient documentation

## 2020-02-12 DIAGNOSIS — Z7984 Long term (current) use of oral hypoglycemic drugs: Secondary | ICD-10-CM | POA: Insufficient documentation

## 2020-02-12 DIAGNOSIS — I10 Essential (primary) hypertension: Secondary | ICD-10-CM | POA: Diagnosis not present

## 2020-02-12 DIAGNOSIS — Z87891 Personal history of nicotine dependence: Secondary | ICD-10-CM | POA: Insufficient documentation

## 2020-02-12 DIAGNOSIS — Z79899 Other long term (current) drug therapy: Secondary | ICD-10-CM | POA: Insufficient documentation

## 2020-02-12 DIAGNOSIS — Z7982 Long term (current) use of aspirin: Secondary | ICD-10-CM | POA: Insufficient documentation

## 2020-02-12 DIAGNOSIS — Z7901 Long term (current) use of anticoagulants: Secondary | ICD-10-CM | POA: Diagnosis not present

## 2020-02-12 DIAGNOSIS — R040 Epistaxis: Secondary | ICD-10-CM | POA: Insufficient documentation

## 2020-02-12 DIAGNOSIS — I2510 Atherosclerotic heart disease of native coronary artery without angina pectoris: Secondary | ICD-10-CM | POA: Diagnosis not present

## 2020-02-12 DIAGNOSIS — E1140 Type 2 diabetes mellitus with diabetic neuropathy, unspecified: Secondary | ICD-10-CM | POA: Diagnosis not present

## 2020-02-12 MED ORDER — SILVER NITRATE-POT NITRATE 75-25 % EX MISC
CUTANEOUS | Status: AC
  Filled 2020-02-12: qty 10

## 2020-02-12 NOTE — ED Provider Notes (Signed)
[facility] EMERGENCY DEPARTMENT Provider Note   CSN: [id number] Arrival date & time: 02/12/20  1657     History Chief Complaint  Patient presents with  . Epistaxis    Daniel Tyler is a 79 y.o. male.  Patient is a 79 year old male who presents with nosebleed.  He states that he has had some intermittent nosebleeds for the last 4 days.  He denies any trauma or URI symptoms prior to the nosebleeds.  He says they just start spontaneously.  It only is bleeding of his right nose.  He says it never drains down the back of his throat that always comes out his nose.  EMS has been out there several times and has been able to stop the bleeding and he has not wanted to be transported to the ED although they advised him today to be transported to the ED.  He says he does not overall feel bad.  He has no dizziness or fatigue.  No history of nosebleeds in the past.  He denies any other significant bleeding or bruising.  He is on Plavix but no other anticoagulants.        Past Medical History:  Diagnosis Date  . Anemia   . Carotid stenosis   . Coronary artery disease   . Depression    bc of foot pain but no meds required  . Diabetes mellitus    takes Glipizide and Metformin daily;Lantus nightly  . Enlarged heart   . Hyperlipidemia   . Myocardial infarction (HCC) 2013  . Pneumonia 2013   hx of  . Skin spots-aging   . Weakness of right leg     Patient Active Problem List   Diagnosis Date Noted  . Anxiety 12/08/2017  . Hyperlipidemia 12/08/2017  . Malnutrition of moderate degree 10/18/2017  . Gangrene of toe of right foot (HCC) 10/16/2017  . Cellulitis of great toe of right foot 10/16/2017  . Osteomyelitis of great toe of right foot (HCC) 10/16/2017  . Uncontrolled type 2 diabetes mellitus with peripheral neuropathy (HCC) 06/10/2016  . Skin lesion 11/30/2015  . Anemia 09/15/2015  . Right foot ulcer (HCC) 09/15/2015  . Renal insufficiency 09/15/2015  . Syncope  07/20/2015  . Aftercare following surgery of the circulatory system, NEC 09/09/2012  . Carotid artery stenosis 09/09/2012  . Peripheral vascular disease, unspecified (HCC) 07/29/2012  . Atherosclerosis of native arteries of the extremities with ulceration (HCC) 07/08/2012  . Type II or unspecified type diabetes mellitus with peripheral circulatory disorders, uncontrolled(250.72) 07/08/2012  . Diabetic foot ulcer (HCC) 06/26/2012  . Occlusion and stenosis of carotid artery without mention of cerebral infarction 07/24/2011  . Hypertension   . COPD (chronic obstructive pulmonary disease) (HCC)   . Anemia due to blood loss, acute   . S/P CABG (coronary artery bypass graft) 07/01/2011  . Carotid artery disease (HCC) 07/01/2011  . Disorder of arteries and arterioles (HCC) 07/01/2011  . Depression 06/07/2011  . Dyslipidemia   . CAD (coronary artery disease) 04/24/2011  . Insulin dependent type 2 diabetes mellitus, controlled (HCC)     Past Surgical History:  Procedure Laterality Date  . ABDOMINAL AORTAGRAM    . ABDOMINAL AORTAGRAM N/A 07/09/2012   Procedure: ABDOMINAL Ronny FlurryAORTAGRAM;  Surgeon: Sherren Kernsharles E Fields, MD;  [facility];  Service: Cardiovascular;  Laterality: N/A;  . ABDOMINAL AORTOGRAM W/LOWER EXTREMITY N/A 10/19/2017   Procedure: ABDOMINAL AORTOGRAM W/LOWER EXTREMITY;  Surgeon: Cephus Shellinglark, Christopher J, MD;  Location: [facility] INVASIVE CV LAB;  Service:  Cardiovascular;  Laterality: N/A;  . AMPUTATION Right 07/14/2012   Procedure: AMPUTATION DIGIT;  Surgeon: Sherren Kerns, MD;  Location: [facility] OR;  Service: Vascular;  Laterality: Right;  Amputation Right Fourth Toe  . AMPUTATION Right 10/20/2017   Procedure: AMPUTATION DIGIT;  Surgeon: Maeola Harman, MD;  Location[facility] OR;  Service: Vascular;  Laterality: Right;  . CARDIAC CATHETERIZATION  2013  . CAROTID ENDARTERECTOMY  07/01/11   Right CEA  . COLONOSCOPY    . CORONARY ARTERY BYPASS GRAFT  07/01/2011   Procedure: CORONARY ARTERY  BYPASS GRAFTING (CABG);  Surgeon: Loreli Slot, MD;  Location: [facility] OR;  Service: Open Heart Surgery;  Laterality: N/A;  . ENDARTERECTOMY  07/01/2011   Procedure: ENDARTERECTOMY CAROTID;  Surgeon: Sherren Kerns, MD;  Location: [facility] OR;  Service: Vascular;  Laterality: Right;  . FEMORAL-POPLITEAL BYPASS GRAFT Right 07/14/2012   Procedure: BYPASS GRAFT FEMORAL-POPLITEAL ARTERY;  Surgeon: Sherren Kerns, MD;  Location: [facility] OR;  Service: Vascular;  Laterality: Right;  Right Femoral Popliteal Bypass with Left Leg Nonreversed Greater Saphenous Vein  . INTRAOPERATIVE ARTERIOGRAM Right 07/14/2012   Procedure: INTRA OPERATIVE ARTERIOGRAM;  Surgeon: Sherren Kerns, MD;  Location: [facility] OR;  Service: Vascular;  Laterality: Right;  . LEFT HEART CATHETERIZATION WITH CORONARY ANGIOGRAM N/A 04/24/2011   Procedure: LEFT HEART CATHETERIZATION WITH CORONARY ANGIOGRAM;  Surgeon: Othella Boyer, MD;  Location: [facility] CATH LAB;  Service: Cardiovascular;  Laterality: N/A;  . PERIPHERAL VASCULAR BALLOON ANGIOPLASTY Right 10/19/2017   Procedure: PERIPHERAL VASCULAR BALLOON ANGIOPLASTY;  Surgeon: Cephus Shelling, MD;  Location: [facility] INVASIVE CV LAB;  Service: Cardiovascular;  Laterality: Right;  peroneal  . PERIPHERAL VASCULAR INTERVENTION Right 10/19/2017   Procedure: PERIPHERAL VASCULAR INTERVENTION;  Surgeon: Cephus Shelling, MD;  Location: [facility] INVASIVE CV LAB;  Service: Cardiovascular;  Laterality: Right;  common iliac       Family History  Problem Relation Age of Onset  . Heart disease Father   . Cancer Brother   . Diabetes Brother   . Diabetes Daughter   . Anesthesia problems Neg Hx   . Hypotension Neg Hx   . Malignant hyperthermia Neg Hx   . Pseudochol deficiency Neg Hx     Social History   Tobacco Use  . Smoking status: Former Smoker    Packs/day: 1.00    Years: 20.00    Pack years: 20.00    Types: Cigarettes    Quit date: 02/24/1978    Years since quitting: 41.9  . Smokeless tobacco: Never  Used  Vaping Use  . Vaping Use: Never used  Substance Use Topics  . Alcohol use: No  . Drug use: No    Home Medications Prior to Admission medications   Medication Sig Start Date End Date Taking? Authorizing Provider  acetaminophen (TYLENOL) 500 MG tablet Take 1,000-1,500 mg by mouth daily as needed for mild pain.    [provider]  aspirin EC 81 MG tablet Take 81 mg by mouth daily.    [provider]  clopidogrel (PLAVIX) 75 MG tablet Take 1 tablet (75 mg total) by mouth daily with breakfast. 10/24/17   Burnadette Pop, MD  clopidogrel (PLAVIX) 75 MG tablet Take 1 tablet by mouth once daily 11/08/19   Maeola Harman, MD  fenofibrate 160 MG tablet Take 160 mg by mouth daily. 06/23/15   [provider]  glipiZIDE (GLUCOTROL) 10 MG tablet Take 10 mg by mouth 2 (two) times daily before a meal.  [provider]  HYDROcodone-acetaminophen (NORCO/VICODIN) 5-325 MG tablet Take 1 tablet by mouth every 6 (six) hours as needed for moderate pain. 10/23/17   Burnadette Pop, MD  magnesium oxide (MAG-OX) 400 (241.3 Mg) MG tablet Take 1 tablet (400 mg total) by mouth 2 (two) times daily. 07/21/15   Dorothea Ogle, MD  metFORMIN (GLUCOPHAGE) 1000 MG tablet TAKE ONE TABLET BY MOUTH TWICE DAILY FOR  DIABETES Patient taking differently: Take 1,000 mg by mouth 2 (two) times daily with a meal.  10/21/12   Reed, Tiffany L, DO  metoprolol tartrate (LOPRESSOR) 25 MG tablet Take 12.5 mg by mouth 2 (two) times daily. 03/06/15   [provider]  simvastatin (ZOCOR) 40 MG tablet Take 1 tablet (40 mg total) by mouth at bedtime. 10/23/17   Burnadette Pop, MD    Allergies    Patient has no known allergies.  Review of Systems   Review of Systems  Constitutional: Negative for chills, diaphoresis, fatigue and fever.  HENT: Positive for nosebleeds. Negative for congestion, rhinorrhea and sneezing.   Eyes: Negative.   Respiratory: Negative for cough, chest tightness  and shortness of breath.   Cardiovascular: Negative for chest pain and leg swelling.  Gastrointestinal: Negative for abdominal pain, blood in stool, diarrhea, nausea and vomiting.  Genitourinary: Negative for difficulty urinating, flank pain, frequency and hematuria.  Musculoskeletal: Negative for arthralgias and back pain.  Skin: Negative for rash.  Neurological: Negative for dizziness, speech difficulty, weakness, numbness and headaches.    Physical Exam Updated Vital Signs BP (!) 167/109 (BP Location: Right Arm)   Pulse 73   Temp 98.1 F (36.7 C) (Oral)   Resp 18   Ht 5\' 9"  (1.753 m)   Wt 63.5 kg   SpO2 100%   BMI 20.67 kg/m   Physical Exam Constitutional:      Appearance: He is well-developed and well-nourished.  HENT:     Head: Normocephalic and atraumatic.     Nose:     Comments: Patient has no active bleeding.  There is little spot that appears to be potentially the source of bleeding in his right nare in the anterior portion of the nare.  There is no blood in the posterior pharynx. Eyes:     Pupils: Pupils are equal, round, and reactive to light.  Cardiovascular:     Rate and Rhythm: Normal rate and regular rhythm.     Heart sounds: Normal heart sounds.  Pulmonary:     Effort: Pulmonary effort is normal. No respiratory distress.     Breath sounds: Normal breath sounds. No wheezing or rales.  Chest:     Chest wall: No tenderness.  Abdominal:     General: Bowel sounds are normal.     Palpations: Abdomen is soft.     Tenderness: There is no abdominal tenderness. There is no guarding or rebound.  Musculoskeletal:        General: No edema. Normal range of motion.     Cervical back: Normal range of motion and neck supple.  Lymphadenopathy:     Cervical: No cervical adenopathy.  Skin:    General: Skin is warm and dry.     Findings: No rash.  Neurological:     Mental Status: He is alert and oriented to person, place, and time.  Psychiatric:        Mood and  Affect: Mood and affect normal.     ED Results / Procedures / Treatments   Labs (all labs ordered  are listed, but only abnormal results are displayed) Labs Reviewed - No data to display  EKG None  Radiology No results found.  Procedures .Epistaxis Management  Date/Time: 02/12/2020 7:17 PM Performed by: Rolan Bucco, MD Authorized by: Rolan Bucco, MD   Consent:    Consent obtained:  Verbal   Consent given by:  Patient   Risks, benefits, and alternatives were discussed: yes     Risks discussed:  Nasal injury, pain and bleeding   Alternatives discussed:  No treatment Universal protocol:    Procedure explained and questions answered to patient or proxy's satisfaction: yes     Relevant documents present and verified: yes     Patient identity confirmed:  Verbally with patient Anesthesia:    Anesthesia method:  None Procedure details:    Treatment site:  R anterior   Treatment method:  Silver nitrate   Treatment complexity:  Limited   Treatment episode: initial   Post-procedure details:    Assessment:  Bleeding stopped   Procedure completion:  Tolerated   (including critical care time)  Medications Ordered in ED Medications  silver nitrate applicators 75-25 % applicator (has no administration in time range)    ED Course  I have reviewed the triage vital signs and the nursing notes.  Pertinent labs & imaging results that were available during my care of the patient were reviewed by me and considered in my medical decision making (see chart for details).    MDM Rules/Calculators/A&P                          Patient presents after no bleeding.  He has no active bleeding currently but he did have a spot that appeared to be the site of bleeding in his right nare.  This was cauterized using silver nitrate.  It was on the septal aspect so very limited silver nitrate was used.  He had no complications.  No return of the bleeding.  He was discharged home in good  condition.  He was given a referral to follow-up with ENT if his symptoms continue.  He was advised to use Vaseline in the naris for the next several days and Afrin.  Return precautions were given. Final Clinical Impression(s) / ED Diagnoses Final diagnoses:  Epistaxis    Rx / DC Orders ED Discharge Orders    None       Rolan Bucco, MD 02/12/20 1919

## 2020-02-12 NOTE — Discharge Instructions (Signed)
Keep Vaseline in the inside of the nose.  Avoid blowing your nose.  Use Afrin 2 sprays in the right nose daily for the next 3 days.  Follow-up with the ear nose and throat doctor if the symptoms continue.  Return to the emergency room if you have any worsening bleeding.

## 2020-02-12 NOTE — ED Triage Notes (Signed)
Pt reports intermittent nosebleeds x 4 days. States EMS has been out to eval 4 times and today they advised him to come to the ED. Bleeding controlled at present

## 2020-02-12 NOTE — ED Notes (Signed)
He states his epistases have been right-sided. He further tells me he is on clipodegrel. He denies any history of nosebleeds, nor has he ever seen and ENT provider.

## 2020-02-28 ENCOUNTER — Other Ambulatory Visit: Payer: Self-pay

## 2020-02-29 ENCOUNTER — Inpatient Hospital Stay (HOSPITAL_COMMUNITY)
Admission: EM | Admit: 2020-02-29 | Discharge: 2020-03-07 | DRG: 522 | Disposition: A | Discharge status: Skilled Nursing Facility | Payer: Medicare (Managed Care) | Attending: Family Medicine | Admitting: Family Medicine

## 2020-02-29 ENCOUNTER — Emergency Department (HOSPITAL_COMMUNITY): Payer: Medicare (Managed Care)

## 2020-02-29 ENCOUNTER — Other Ambulatory Visit: Payer: Self-pay

## 2020-02-29 ENCOUNTER — Encounter (HOSPITAL_COMMUNITY): Payer: Self-pay | Admitting: Internal Medicine

## 2020-02-29 ENCOUNTER — Ambulatory Visit: Payer: Self-pay

## 2020-02-29 DIAGNOSIS — D638 Anemia in other chronic diseases classified elsewhere: Secondary | ICD-10-CM | POA: Diagnosis present

## 2020-02-29 DIAGNOSIS — W010XXA Fall on same level from slipping, tripping and stumbling without subsequent striking against object, initial encounter: Secondary | ICD-10-CM | POA: Diagnosis present

## 2020-02-29 DIAGNOSIS — N179 Acute kidney failure, unspecified: Secondary | ICD-10-CM | POA: Diagnosis not present

## 2020-02-29 DIAGNOSIS — Z951 Presence of aortocoronary bypass graft: Secondary | ICD-10-CM

## 2020-02-29 DIAGNOSIS — D62 Acute posthemorrhagic anemia: Secondary | ICD-10-CM | POA: Diagnosis not present

## 2020-02-29 DIAGNOSIS — E1165 Type 2 diabetes mellitus with hyperglycemia: Secondary | ICD-10-CM | POA: Diagnosis present

## 2020-02-29 DIAGNOSIS — S72011A Unspecified intracapsular fracture of right femur, initial encounter for closed fracture: Principal | ICD-10-CM | POA: Diagnosis present

## 2020-02-29 DIAGNOSIS — Z833 Family history of diabetes mellitus: Secondary | ICD-10-CM | POA: Diagnosis not present

## 2020-02-29 DIAGNOSIS — I10 Essential (primary) hypertension: Secondary | ICD-10-CM | POA: Diagnosis present

## 2020-02-29 DIAGNOSIS — Y9301 Activity, walking, marching and hiking: Secondary | ICD-10-CM | POA: Diagnosis present

## 2020-02-29 DIAGNOSIS — E119 Type 2 diabetes mellitus without complications: Secondary | ICD-10-CM | POA: Diagnosis not present

## 2020-02-29 DIAGNOSIS — E871 Hypo-osmolality and hyponatremia: Secondary | ICD-10-CM | POA: Diagnosis present

## 2020-02-29 DIAGNOSIS — M160 Bilateral primary osteoarthritis of hip: Secondary | ICD-10-CM | POA: Diagnosis present

## 2020-02-29 DIAGNOSIS — F32A Depression, unspecified: Secondary | ICD-10-CM | POA: Diagnosis present

## 2020-02-29 DIAGNOSIS — Y92009 Unspecified place in unspecified non-institutional (private) residence as the place of occurrence of the external cause: Secondary | ICD-10-CM

## 2020-02-29 DIAGNOSIS — J449 Chronic obstructive pulmonary disease, unspecified: Secondary | ICD-10-CM | POA: Diagnosis present

## 2020-02-29 DIAGNOSIS — S72001A Fracture of unspecified part of neck of right femur, initial encounter for closed fracture: Secondary | ICD-10-CM

## 2020-02-29 DIAGNOSIS — Z87891 Personal history of nicotine dependence: Secondary | ICD-10-CM | POA: Diagnosis not present

## 2020-02-29 DIAGNOSIS — S72009A Fracture of unspecified part of neck of unspecified femur, initial encounter for closed fracture: Secondary | ICD-10-CM | POA: Diagnosis present

## 2020-02-29 DIAGNOSIS — Z79899 Other long term (current) drug therapy: Secondary | ICD-10-CM

## 2020-02-29 DIAGNOSIS — I2510 Atherosclerotic heart disease of native coronary artery without angina pectoris: Secondary | ICD-10-CM | POA: Diagnosis present

## 2020-02-29 DIAGNOSIS — E785 Hyperlipidemia, unspecified: Secondary | ICD-10-CM | POA: Diagnosis present

## 2020-02-29 DIAGNOSIS — Z66 Do not resuscitate: Secondary | ICD-10-CM | POA: Diagnosis present

## 2020-02-29 DIAGNOSIS — I252 Old myocardial infarction: Secondary | ICD-10-CM

## 2020-02-29 DIAGNOSIS — T1490XA Injury, unspecified, initial encounter: Secondary | ICD-10-CM

## 2020-02-29 DIAGNOSIS — Z7902 Long term (current) use of antithrombotics/antiplatelets: Secondary | ICD-10-CM | POA: Diagnosis not present

## 2020-02-29 DIAGNOSIS — E1151 Type 2 diabetes mellitus with diabetic peripheral angiopathy without gangrene: Secondary | ICD-10-CM | POA: Diagnosis present

## 2020-02-29 DIAGNOSIS — I739 Peripheral vascular disease, unspecified: Secondary | ICD-10-CM | POA: Diagnosis not present

## 2020-02-29 DIAGNOSIS — Z8249 Family history of ischemic heart disease and other diseases of the circulatory system: Secondary | ICD-10-CM | POA: Diagnosis not present

## 2020-02-29 DIAGNOSIS — Z89421 Acquired absence of other right toe(s): Secondary | ICD-10-CM | POA: Diagnosis not present

## 2020-02-29 DIAGNOSIS — Z20822 Contact with and (suspected) exposure to covid-19: Secondary | ICD-10-CM | POA: Diagnosis present

## 2020-02-29 DIAGNOSIS — Z7982 Long term (current) use of aspirin: Secondary | ICD-10-CM

## 2020-02-29 DIAGNOSIS — Z794 Long term (current) use of insulin: Secondary | ICD-10-CM

## 2020-02-29 DIAGNOSIS — J439 Emphysema, unspecified: Secondary | ICD-10-CM | POA: Diagnosis present

## 2020-02-29 DIAGNOSIS — Z01818 Encounter for other preprocedural examination: Secondary | ICD-10-CM

## 2020-02-29 DIAGNOSIS — E118 Type 2 diabetes mellitus with unspecified complications: Secondary | ICD-10-CM

## 2020-02-29 LAB — CBC WITH DIFFERENTIAL/PLATELET
Abs Immature Granulocytes: 0.04 10*3/uL (ref 0.00–0.07)
Basophils Absolute: 0 10*3/uL (ref 0.0–0.1)
Basophils Relative: 0 %
Eosinophils Absolute: 0.1 10*3/uL (ref 0.0–0.5)
Eosinophils Relative: 1 %
HCT: 33.3 % — ABNORMAL LOW (ref 39.0–52.0)
Hemoglobin: 10.5 g/dL — ABNORMAL LOW (ref 13.0–17.0)
Immature Granulocytes: 0 %
Lymphocytes Relative: 9 %
Lymphs Abs: 0.8 10*3/uL (ref 0.7–4.0)
MCH: 29.4 pg (ref 26.0–34.0)
MCHC: 31.5 g/dL (ref 30.0–36.0)
MCV: 93.3 fL (ref 80.0–100.0)
Monocytes Absolute: 0.6 10*3/uL (ref 0.1–1.0)
Monocytes Relative: 6 %
Neutro Abs: 7.8 10*3/uL — ABNORMAL HIGH (ref 1.7–7.7)
Neutrophils Relative %: 84 %
Platelets: 327 10*3/uL (ref 150–400)
RBC: 3.57 MIL/uL — ABNORMAL LOW (ref 4.22–5.81)
RDW: 13.8 % (ref 11.5–15.5)
WBC: 9.3 10*3/uL (ref 4.0–10.5)
nRBC: 0 % (ref 0.0–0.2)

## 2020-02-29 LAB — RESP PANEL BY RT-PCR (FLU A&B, COVID) ARPGX2
Influenza A by PCR: NEGATIVE
Influenza B by PCR: NEGATIVE
SARS Coronavirus 2 by RT PCR: NEGATIVE

## 2020-02-29 LAB — BASIC METABOLIC PANEL
Anion gap: 11 (ref 5–15)
BUN: 16 mg/dL (ref 8–23)
CO2: 18 mmol/L — ABNORMAL LOW (ref 22–32)
Calcium: 9.1 mg/dL (ref 8.9–10.3)
Chloride: 100 mmol/L (ref 98–111)
Creatinine, Ser: 1.03 mg/dL (ref 0.61–1.24)
GFR, Estimated: >60 mL/min (ref >60)
Glucose, Bld: 163 mg/dL — ABNORMAL HIGH (ref 70–99)
Potassium: 4.2 mmol/L (ref 3.5–5.1)
Sodium: 129 mmol/L — ABNORMAL LOW (ref 135–145)

## 2020-02-29 MED ORDER — MORPHINE SULFATE (PF) 2 MG/ML IV SOLN
0.5000 mg | INTRAVENOUS | Status: DC | PRN
  Administered 2020-03-01 (×4): 0.5 mg via INTRAVENOUS
  Filled 2020-02-29 (×4): qty 1

## 2020-02-29 MED ORDER — INSULIN ASPART 100 UNIT/ML ~~LOC~~ SOLN
0.0000 [IU] | SUBCUTANEOUS | Status: DC
  Administered 2020-03-01 (×2): 2 [IU] via SUBCUTANEOUS
  Administered 2020-03-01: 21:00:00 3 [IU] via SUBCUTANEOUS
  Administered 2020-03-01: 2 [IU] via SUBCUTANEOUS
  Administered 2020-03-02 (×3): 3 [IU] via SUBCUTANEOUS
  Administered 2020-03-02: 2 [IU] via SUBCUTANEOUS
  Administered 2020-03-02 (×2): 3 [IU] via SUBCUTANEOUS
  Administered 2020-03-03: 2 [IU] via SUBCUTANEOUS
  Administered 2020-03-03: 3 [IU] via SUBCUTANEOUS
  Administered 2020-03-03: 2 [IU] via SUBCUTANEOUS
  Administered 2020-03-03: 1 [IU] via SUBCUTANEOUS
  Administered 2020-03-03 – 2020-03-04 (×4): 2 [IU] via SUBCUTANEOUS
  Administered 2020-03-04: 1 [IU] via SUBCUTANEOUS
  Administered 2020-03-04 (×2): 2 [IU] via SUBCUTANEOUS
  Administered 2020-03-05: 5 [IU] via SUBCUTANEOUS

## 2020-02-29 MED ORDER — MORPHINE SULFATE (PF) 4 MG/ML IV SOLN
4.0000 mg | Freq: Once | INTRAVENOUS | Status: AC
  Administered 2020-02-29: 4 mg via INTRAVENOUS
  Filled 2020-02-29: qty 1

## 2020-02-29 MED ORDER — SIMVASTATIN 20 MG PO TABS
40.0000 mg | ORAL_TABLET | Freq: Every day | ORAL | Status: DC
  Administered 2020-03-01 (×2): 40 mg via ORAL
  Filled 2020-02-29 (×2): qty 2

## 2020-02-29 MED ORDER — METOPROLOL TARTRATE 12.5 MG HALF TABLET
12.5000 mg | ORAL_TABLET | Freq: Two times a day (BID) | ORAL | Status: DC
  Administered 2020-03-01 – 2020-03-07 (×14): 12.5 mg via ORAL
  Filled 2020-02-29 (×14): qty 1

## 2020-02-29 MED ORDER — FENOFIBRATE 160 MG PO TABS
160.0000 mg | ORAL_TABLET | Freq: Every day | ORAL | Status: DC
  Administered 2020-03-01 – 2020-03-03 (×3): 160 mg via ORAL
  Filled 2020-02-29 (×3): qty 1

## 2020-02-29 MED ORDER — FENTANYL CITRATE (PF) 100 MCG/2ML IJ SOLN
50.0000 ug | Freq: Once | INTRAMUSCULAR | Status: AC
Start: 2020-02-29 — End: 2020-02-29
  Administered 2020-02-29: 50 ug via INTRAVENOUS
  Filled 2020-02-29: qty 2

## 2020-02-29 NOTE — H&P (Signed)
History and Physical    Olajuwon Fosdick Blasko JAS:[id number] DOB: 06/07/1940 DOA: 02/29/2020  PCP: Dois Davenport, MD  Patient coming from: Home.  Chief Complaint: Fall.  HPI: Daniel Tyler is a 80 y.o. male with history of CAD status post CABG, peripheral vascular disease status post femoropopliteal bypass history of carotid endarterectomy, anemia, diabetes mellitus type 2 had a fall at home when he tripped and fell at his door.  Denies hitting his head or losing consciousness.  Denies chest pain or shortness of breath.  ED Course: In the ER x-rays revealed right hip fracture and on-call orthopedic surgeon Dr. Susa Simmonds has been consulted plan is to do surgery.  Labs show hemoglobin of 10.5 sodium 129.  Covid test is negative.  Chest x-ray unremarkable EKG shows normal sinus rhythm with nonspecific ST changes.  Review of Systems: As per HPI, rest all negative.   Past Medical History:  Diagnosis Date  . Anemia   . Carotid stenosis   . Coronary artery disease   . Depression    bc of foot pain but no meds required  . Diabetes mellitus    takes Glipizide and Metformin daily;Lantus nightly  . Enlarged heart   . Hyperlipidemia   . Myocardial infarction (HCC) 2013  . Pneumonia 2013   hx of  . Skin spots-aging   . Weakness of right leg     Past Surgical History:  Procedure Laterality Date  . ABDOMINAL AORTAGRAM    . ABDOMINAL AORTAGRAM N/A 07/09/2012   Procedure: ABDOMINAL Ronny Flurry;  Surgeon: Sherren Kerns, MD;  Location: [facility] CATH LAB;  Service: Cardiovascular;  Laterality: N/A;  . ABDOMINAL AORTOGRAM W/LOWER EXTREMITY N/A 10/19/2017   Procedure: ABDOMINAL AORTOGRAM W/LOWER EXTREMITY;  Surgeon: Cephus Shelling, MD;  Location: [facility] INVASIVE CV LAB;  Service: Cardiovascular;  Laterality: N/A;  . AMPUTATION Right 07/14/2012   Procedure: AMPUTATION DIGIT;  Surgeon: Sherren Kerns, MD;  Location: [facility] OR;  Service: Vascular;  Laterality: Right;  Amputation Right Fourth Toe  . AMPUTATION  Right 10/20/2017   Procedure: AMPUTATION DIGIT;  Surgeon: Maeola Harman, MD;  Location[facility] OR;  Service: Vascular;  Laterality: Right;  . CARDIAC CATHETERIZATION  2013  . CAROTID ENDARTERECTOMY  07/01/11   Right CEA  . COLONOSCOPY    . CORONARY ARTERY BYPASS GRAFT  07/01/2011   Procedure: CORONARY ARTERY BYPASS GRAFTING (CABG);  Surgeon: Loreli Slot, MD;  Location: [facility] OR;  Service: Open Heart Surgery;  Laterality: N/A;  . ENDARTERECTOMY  07/01/2011   Procedure: ENDARTERECTOMY CAROTID;  Surgeon: Sherren Kerns, MD;  Location: [facility] OR;  Service: Vascular;  Laterality: Right;  . FEMORAL-POPLITEAL BYPASS GRAFT Right 07/14/2012   Procedure: BYPASS GRAFT FEMORAL-POPLITEAL ARTERY;  Surgeon: Sherren Kerns, MD;  Location: [facility] OR;  Service: Vascular;  Laterality: Right;  Right Femoral Popliteal Bypass with Left Leg Nonreversed Greater Saphenous Vein  . INTRAOPERATIVE ARTERIOGRAM Right 07/14/2012   Procedure: INTRA OPERATIVE ARTERIOGRAM;  Surgeon: Sherren Kerns, MD;  Location: [facility] OR;  Service: Vascular;  Laterality: Right;  . LEFT HEART CATHETERIZATION WITH CORONARY ANGIOGRAM N/A 04/24/2011   Procedure: LEFT HEART CATHETERIZATION WITH CORONARY ANGIOGRAM;  Surgeon: Othella Boyer, MD;  Location: [facility] CATH LAB;  Service: Cardiovascular;  Laterality: N/A;  . PERIPHERAL VASCULAR BALLOON ANGIOPLASTY Right 10/19/2017   Procedure: PERIPHERAL VASCULAR BALLOON ANGIOPLASTY;  Surgeon: Cephus Shelling, MD;  Location: [facility] INVASIVE CV LAB;  Service: Cardiovascular;  Laterality: Right;  peroneal  . PERIPHERAL VASCULAR INTERVENTION  Right 10/19/2017   Procedure: PERIPHERAL VASCULAR INTERVENTION;  Surgeon: Cephus Shelling, MD;  Location: [facility] INVASIVE CV LAB;  Service: Cardiovascular;  Laterality: Right;  common iliac     reports that he quit smoking about 42 years ago. His smoking use included cigarettes. He has a 20.00 pack-year smoking history. He has never used smokeless tobacco. He reports that he  does not drink alcohol and does not use drugs.  No Known Allergies  Family History  Problem Relation Age of Onset  . Heart disease Father   . Cancer Brother   . Diabetes Brother   . Diabetes Daughter   . Anesthesia problems Neg Hx   . Hypotension Neg Hx   . Malignant hyperthermia Neg Hx   . Pseudochol deficiency Neg Hx     Prior to Admission medications   Medication Sig Start Date End Date Taking? Authorizing Provider  acetaminophen (TYLENOL) 500 MG tablet Take 1,000-1,500 mg by mouth daily as needed for mild pain.    [provider]  aspirin EC 81 MG tablet Take 81 mg by mouth daily.    [provider]  clopidogrel (PLAVIX) 75 MG tablet Take 1 tablet (75 mg total) by mouth daily with breakfast. 10/24/17   Burnadette Pop, MD  clopidogrel (PLAVIX) 75 MG tablet Take 1 tablet by mouth once daily 11/08/19   Maeola Harman, MD  fenofibrate 160 MG tablet Take 160 mg by mouth daily. 06/23/15   [provider]  glipiZIDE (GLUCOTROL) 10 MG tablet Take 10 mg by mouth 2 (two) times daily before a meal.    [provider]  HYDROcodone-acetaminophen (NORCO/VICODIN) 5-325 MG tablet Take 1 tablet by mouth every 6 (six) hours as needed for moderate pain. 10/23/17   Burnadette Pop, MD  magnesium oxide (MAG-OX) 400 (241.3 Mg) MG tablet Take 1 tablet (400 mg total) by mouth 2 (two) times daily. 07/21/15   Dorothea Ogle, MD  metFORMIN (GLUCOPHAGE) 1000 MG tablet TAKE ONE TABLET BY MOUTH TWICE DAILY FOR  DIABETES Patient taking differently: Take 1,000 mg by mouth 2 (two) times daily with a meal.  10/21/12   Reed, Tiffany L, DO  metoprolol tartrate (LOPRESSOR) 25 MG tablet Take 12.5 mg by mouth 2 (two) times daily. 03/06/15   [provider]  simvastatin (ZOCOR) 40 MG tablet Take 1 tablet (40 mg total) by mouth at bedtime. 10/23/17   Burnadette Pop, MD    Physical Exam: Constitutional: Moderately built and nourished. Vitals:   02/29/20 1600 02/29/20  1746  BP: (!) 181/73 124/68  Pulse: 90 86  Resp: 16 18  Temp: 97.8 F (36.6 C)   TempSrc: Oral   SpO2: 98% 98%   Eyes: Anicteric no pallor. ENMT: No discharge from the ears eyes nose or mouth. Neck: No mass felt.  No neck rigidity. Respiratory: No rhonchi or crepitations. Cardiovascular: S1-S2 heard. Abdomen: Soft nontender bowel sounds present. Musculoskeletal: Pain on moving right hip. Skin: No rash. Neurologic: Alert awake oriented to time place and person.  Moves all extremities. Psychiatric: Appears normal.  Normal affect.   Labs on Admission: I have personally reviewed following labs and imaging studies  CBC: Recent Labs  Lab 02/29/20 1720  WBC 9.3  NEUTROABS 7.8*  HGB 10.5*  HCT 33.3*  MCV 93.3  PLT 327   Basic Metabolic Panel: Recent Labs  Lab 02/29/20 1720  NA 129*  K 4.2  CL 100  CO2 18*  GLUCOSE 163*  BUN 16  CREATININE 1.03  CALCIUM 9.1   GFR: CrCl cannot be calculated (Unknown ideal weight.). Liver Function Tests: No results for input(s): AST, ALT, ALKPHOS, BILITOT, PROT, ALBUMIN in the last 168 hours. No results for input(s): LIPASE, AMYLASE in the last 168 hours. No results for input(s): AMMONIA in the last 168 hours. Coagulation Profile: No results for input(s): INR, PROTIME in the last 168 hours. Cardiac Enzymes: No results for input(s): CKTOTAL, CKMB, CKMBINDEX, TROPONINI in the last 168 hours. BNP (last 3 results) No results for input(s): PROBNP in the last 8760 hours. HbA1C: No results for input(s): HGBA1C in the last 72 hours. CBG: No results for input(s): GLUCAP in the last 168 hours. Lipid Profile: No results for input(s): CHOL, HDL, LDLCALC, TRIG, CHOLHDL, LDLDIRECT in the last 72 hours. Thyroid Function Tests: No results for input(s): TSH, T4TOTAL, FREET4, T3FREE, THYROIDAB in the last 72 hours. Anemia Panel: No results for input(s): VITAMINB12, FOLATE, FERRITIN, TIBC, IRON, RETICCTPCT in the last 72 hours. Urine  analysis:    Component Value Date/Time   COLORURINE YELLOW 07/20/2015 2230   APPEARANCEUR CLEAR 07/20/2015 2230   LABSPEC 1.012 07/20/2015 2230   PHURINE 5.0 07/20/2015 2230   GLUCOSEU NEGATIVE 07/20/2015 2230   HGBUR NEGATIVE 07/20/2015 2230   BILIRUBINUR NEGATIVE 07/20/2015 2230   KETONESUR NEGATIVE 07/20/2015 2230   PROTEINUR NEGATIVE 07/20/2015 2230   UROBILINOGEN 1.0 07/13/2012 1445   NITRITE NEGATIVE 07/20/2015 2230   LEUKOCYTESUR NEGATIVE 07/20/2015 2230   Sepsis Labs: @LABRCNTIP (procalcitonin:4,lacticidven:4) ) Recent Results (from the past 240 hour(s))  Resp Panel by RT-PCR (Flu A&B, Covid) Nasopharyngeal Swab     Status: None   Collection Time: 02/29/20  6:24 PM   Specimen: Nasopharyngeal Swab; Nasopharyngeal(NP) swabs in vial transport medium  Result Value Ref Range Status   SARS Coronavirus 2 by RT PCR NEGATIVE NEGATIVE Final    Comment: (NOTE) SARS-CoV-2 target nucleic acids are NOT DETECTED.  The SARS-CoV-2 RNA is generally detectable in upper respiratory specimens during the acute phase of infection. The lowest concentration of SARS-CoV-2 viral copies this assay can detect is 138 copies/mL. A negative result does not preclude SARS-Cov-2 infection and should not be used as the sole basis for treatment or other patient management decisions. A negative result may occur with  improper specimen collection/handling, submission of specimen other than nasopharyngeal swab, presence of viral mutation(s) within the areas targeted by this assay, and inadequate number of viral copies(<138 copies/mL). A negative result must be combined with clinical observations, patient history, and epidemiological information. The expected result is Negative.  Fact Sheet for Patients:  04/28/20  Fact Sheet for Healthcare Providers:  BloggerCourse.com  This test is no t yet approved or cleared by the [employer] FDA and  has  been authorized for detection and/or diagnosis of SARS-CoV-2 by FDA under an Emergency Use Authorization (EUA). This EUA will remain  in effect (meaning this test can be used) for the duration of the COVID-19 declaration under Section 564(b)(1) of the Act, 21 U.S.C.section 360bbb-3(b)(1), unless the authorization is terminated  or revoked sooner.       Influenza A by PCR NEGATIVE NEGATIVE Final   Influenza B by PCR NEGATIVE NEGATIVE Final    Comment: (NOTE) The Xpert Xpress SARS-CoV-2/FLU/RSV plus assay is intended as an aid in the diagnosis of influenza from Nasopharyngeal swab specimens and should not be used as a sole basis for treatment. Nasal washings and aspirates are unacceptable for Xpert Xpress SARS-CoV-2/FLU/RSV testing.  Fact Sheet for Patients: [place]  Fact Sheet for  Healthcare Providers: IncredibleEmployment.be  This test is not yet approved or cleared by the [place] and has been authorized for detection and/or diagnosis of SARS-CoV-2 by FDA under an Emergency Use Authorization (EUA). This EUA will remain in effect (meaning this test can be used) for the duration of the COVID-19 declaration under Section 564(b)(1) of the Act, 21 U.S.C. section 360bbb-3(b)(1), unless the authorization is terminated or revoked.  Performed at [facility], [place] [address], [place], [place] [address]      Radiological Exams on Admission: DG Chest 1 View  Result Date: 02/29/2020 CLINICAL DATA:  Fall with right hip pain EXAM: CHEST  1 VIEW COMPARISON:  07/04/2011, 07/20/2015 FINDINGS: Post sternotomy changes. Emphysematous disease. Postsurgical clips in the left hilar region. Linear scarring in the left mid lung. No acute airspace disease, pleural effusion, or pneumothorax. IMPRESSION: No active cardiopulmonary disease. Emphysematous disease with scarring in the left mid lung. Electronically Signed   By: Donavan Foil  [name]   On: 02/29/2020 17:39   DG Hip Unilat W or Wo Pelvis 2-3 Views Right  Result Date: 02/29/2020 CLINICAL DATA:  Pain status post fall EXAM: DG HIP (WITH OR WITHOUT PELVIS) 2-3V RIGHT COMPARISON:  None. FINDINGS: There is an acute, displaced, transcervical fracture of the proximal right femur. There is no dislocation. There are degenerative changes of both hips. There are vascular calcifications. There is osteopenia. IMPRESSION: 1. Acute, displaced, transcervical fracture of the proximal right femur. 2. Degenerative changes of both hips. 3. Osteopenia. Electronically Signed   By: Constance Holster [name]   On: 02/29/2020 17:36    EKG: Independently reviewed.  Normal sinus rhythm.  Assessment/Plan Principal Problem:   Closed right hip fracture, initial encounter ([place]) Active Problems:   CAD (coronary artery disease)   Insulin dependent type 2 diabetes mellitus, controlled ([place])   S/P CABG (coronary artery bypass graft)   Hypertension   COPD (chronic obstructive pulmonary disease) ([place])   Peripheral vascular disease, unspecified ([place])   Hip fracture ([place])    1. Right hip fracture status post mechanical fall.  Appreciate orthopedic consult.  Will hold antiplatelet agents in anticipation of surgery.  N.p.o. past midnight pain really medication. 2. History of CAD status post CABG and history of carotid endarterectomy and peripheral vascular disease presently holding antiplatelet agents in anticipation of surgery.  Continue beta-blockers. 3. COPD not actively wheezing. 4. Diabetes mellitus type 2 we will keep patient on sliding scale coverage. 5. Anemia appears to be chronic follow CBC. 6. Hyponatremia cause not clear follow metabolic panel.  Check urine studies.  Since patient has fracture of the right hip will need more than 2 midnight stay in inpatient status.   DVT prophylaxis: SCDs for now.  Pharmacological DVT prophylaxis to be started once okay with orthopedics. Code Status: DNR.   Confirmed with patient's healthcare power of attorney Mr. Landry Mellow. Family Communication: Mr. Landry Mellow patient's healthcare power of attorney. Disposition Plan: May need rehab. Consults called: Orthopedics. Admission status: Inpatient.   Rise Patience MD [facility] Pager [phone].  If 7PM-7AM, please contact night-coverage [email or web address] Password [email or web address]  02/29/2020, 8:40 PM

## 2020-02-29 NOTE — ED Triage Notes (Addendum)
Pt from home via EMS after mechanical fall over threshold of entryway, injuring R leg/hip. Pain, shortening, and outwards rotation to R leg. 100 mcg Fentanyl given PTA.

## 2020-02-29 NOTE — Consult Note (Signed)
Brief orthopedic consult note:  Orthopedics was consulted about [name].  He is a 80 year old male status post fall with right hip pain.  Patient diagnosed with a right femoral neck fracture on x-ray.  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  Patient is on Plavix and has a history of CAD, peripheral vascular disease and diabetes.  Patient to be admitted to the hospitalist team.  Given his x-ray findings he is indicated for total hip arthroplasty.  I have spoken with Dr. Samson Frederic regarding his diagnosis and he has agreed to assume care on 03/01/2020 and perform the surgery.  We will post him for surgery tomorrow.  Please keep the patient n.p.o. past midnight.  No need to stop Plavix for surgery.  We will discussed this with the patient at the bedside.

## 2020-02-29 NOTE — ED Provider Notes (Signed)
[facility] EMERGENCY DEPARTMENT Provider Note   CSN: [id number] Arrival date & time: 02/29/20  1557     History Chief Complaint  Patient presents with  . Fall  . Hip Pain    Daniel Tyler is a 80 y.o. male with a past medical history significant for anemia, CAD, depression, diabetes, hyperlipidemia, history of MI who presents to the ED after mechanical fall.  Patient states he tripped over the door frame landing directly on his right hip.  He admits to severe right hip pain worse with movement.  Patient was given 100 mcg fentanyl prior to arrival.  Patient denies head injury and loss of consciousness.  He is currently on Plavix and ASA 81 mg.  Denies any other injuries.  Denies neck pain, headache, and back pain.  Denies previous right hip injury.  Denies associated numbness/tingling.  Denies chest pain or shortness of breath.  History obtained from patient and past medical records. No interpreter used during encounter.      Past Medical History:  Diagnosis Date  . Anemia   . Carotid stenosis   . Coronary artery disease   . Depression    bc of foot pain but no meds required  . Diabetes mellitus    takes Glipizide and Metformin daily;Lantus nightly  . Enlarged heart   . Hyperlipidemia   . Myocardial infarction (HCC) 2013  . Pneumonia 2013   hx of  . Skin spots-aging   . Weakness of right leg     Patient Active Problem List   Diagnosis Date Noted  . Hip fracture (HCC) 02/29/2020  . Anxiety 12/08/2017  . Hyperlipidemia 12/08/2017  . Malnutrition of moderate degree 10/18/2017  . Gangrene of toe of right foot (HCC) 10/16/2017  . Cellulitis of great toe of right foot 10/16/2017  . Osteomyelitis of great toe of right foot (HCC) 10/16/2017  . Uncontrolled type 2 diabetes mellitus with peripheral neuropathy (HCC) 06/10/2016  . Skin lesion 11/30/2015  . Anemia 09/15/2015  . Right foot ulcer (HCC) 09/15/2015  . Renal insufficiency 09/15/2015  . Syncope  07/20/2015  . Aftercare following surgery of the circulatory system, NEC 09/09/2012  . Carotid artery stenosis 09/09/2012  . Peripheral vascular disease, unspecified (HCC) 07/29/2012  . Atherosclerosis of native arteries of the extremities with ulceration (HCC) 07/08/2012  . Type II or unspecified type diabetes mellitus with peripheral circulatory disorders, uncontrolled(250.72) 07/08/2012  . Diabetic foot ulcer (HCC) 06/26/2012  . Occlusion and stenosis of carotid artery without mention of cerebral infarction 07/24/2011  . Hypertension   . COPD (chronic obstructive pulmonary disease) (HCC)   . Anemia due to blood loss, acute   . S/P CABG (coronary artery bypass graft) 07/01/2011  . Carotid artery disease (HCC) 07/01/2011  . Disorder of arteries and arterioles (HCC) 07/01/2011  . Depression 06/07/2011  . Dyslipidemia   . CAD (coronary artery disease) 04/24/2011  . Insulin dependent type 2 diabetes mellitus, controlled (HCC)     Past Surgical History:  Procedure Laterality Date  . ABDOMINAL AORTAGRAM    . ABDOMINAL AORTAGRAM N/A 07/09/2012   Procedure: ABDOMINAL Ronny Flurry;  Surgeon: Sherren Kerns, MD;  Location: [facility] CATH LAB;  Service: Cardiovascular;  Laterality: N/A;  . ABDOMINAL AORTOGRAM W/LOWER EXTREMITY N/A 10/19/2017   Procedure: ABDOMINAL AORTOGRAM W/LOWER EXTREMITY;  Surgeon: Cephus Shelling, MD;  Location: [facility] INVASIVE CV LAB;  Service: Cardiovascular;  Laterality: N/A;  . AMPUTATION Right 07/14/2012   Procedure: AMPUTATION DIGIT;  Surgeon: Sherren Kerns,  MD;  Location: [facility] OR;  Service: Vascular;  Laterality: Right;  Amputation Right Fourth Toe  . AMPUTATION Right 10/20/2017   Procedure: AMPUTATION DIGIT;  Surgeon: Maeola Harman, MD;  Location: [facility] OR;  Service: Vascular;  Laterality: Right;  . CARDIAC CATHETERIZATION  2013  . CAROTID ENDARTERECTOMY  07/01/11   Right CEA  . COLONOSCOPY    . CORONARY ARTERY BYPASS GRAFT  07/01/2011   Procedure: CORONARY ARTERY  BYPASS GRAFTING (CABG);  Surgeon: Loreli Slot, MD;  Location: [facility] OR;  Service: Open Heart Surgery;  Laterality: N/A;  . ENDARTERECTOMY  07/01/2011   Procedure: ENDARTERECTOMY CAROTID;  Surgeon: Sherren Kerns, MD;  Location: [facility] OR;  Service: Vascular;  Laterality: Right;  . FEMORAL-POPLITEAL BYPASS GRAFT Right 07/14/2012   Procedure: BYPASS GRAFT FEMORAL-POPLITEAL ARTERY;  Surgeon: Sherren Kerns, MD;  Location: [facility] OR;  Service: Vascular;  Laterality: Right;  Right Femoral Popliteal Bypass with Left Leg Nonreversed Greater Saphenous Vein  . INTRAOPERATIVE ARTERIOGRAM Right 07/14/2012   Procedure: INTRA OPERATIVE ARTERIOGRAM;  Surgeon: Sherren Kerns, MD;  Location: [facility] OR;  Service: Vascular;  Laterality: Right;  . LEFT HEART CATHETERIZATION WITH CORONARY ANGIOGRAM N/A 04/24/2011   Procedure: LEFT HEART CATHETERIZATION WITH CORONARY ANGIOGRAM;  Surgeon: Othella Boyer, MD;  Location: [facility] CATH LAB;  Service: Cardiovascular;  Laterality: N/A;  . PERIPHERAL VASCULAR BALLOON ANGIOPLASTY Right 10/19/2017   Procedure: PERIPHERAL VASCULAR BALLOON ANGIOPLASTY;  Surgeon: Cephus Shelling, MD;  Location: [facility] INVASIVE CV LAB;  Service: Cardiovascular;  Laterality: Right;  peroneal  . PERIPHERAL VASCULAR INTERVENTION Right 10/19/2017   Procedure: PERIPHERAL VASCULAR INTERVENTION;  Surgeon: Cephus Shelling, MD;  Location: [facility] INVASIVE CV LAB;  Service: Cardiovascular;  Laterality: Right;  common iliac       Family History  Problem Relation Age of Onset  . Heart disease Father   . Cancer Brother   . Diabetes Brother   . Diabetes Daughter   . Anesthesia problems Neg Hx   . Hypotension Neg Hx   . Malignant hyperthermia Neg Hx   . Pseudochol deficiency Neg Hx     Social History   Tobacco Use  . Smoking status: Former Smoker    Packs/day: 1.00    Years: 20.00    Pack years: 20.00    Types: Cigarettes    Quit date: 02/24/1978    Years since quitting: 42.0  . Smokeless tobacco: Never  Used  Vaping Use  . Vaping Use: Never used  Substance Use Topics  . Alcohol use: No  . Drug use: No    Home Medications Prior to Admission medications   Medication Sig Start Date End Date Taking? Authorizing Provider  acetaminophen (TYLENOL) 500 MG tablet Take 1,000-1,500 mg by mouth daily as needed for mild pain.    [provider]  aspirin EC 81 MG tablet Take 81 mg by mouth daily.    [provider]  clopidogrel (PLAVIX) 75 MG tablet Take 1 tablet (75 mg total) by mouth daily with breakfast. 10/24/17   Burnadette Pop, MD  clopidogrel (PLAVIX) 75 MG tablet Take 1 tablet by mouth once daily 11/08/19   Maeola Harman, MD  fenofibrate 160 MG tablet Take 160 mg by mouth daily. 06/23/15   [provider]  glipiZIDE (GLUCOTROL) 10 MG tablet Take 10 mg by mouth 2 (two) times daily before a meal.    [provider]  HYDROcodone-acetaminophen (NORCO/VICODIN) 5-325 MG tablet Take 1 tablet by mouth every 6 (six) hours  as needed for moderate pain. 10/23/17   Burnadette Pop, MD  magnesium oxide (MAG-OX) 400 (241.3 Mg) MG tablet Take 1 tablet (400 mg total) by mouth 2 (two) times daily. 07/21/15   Dorothea Ogle, MD  metFORMIN (GLUCOPHAGE) 1000 MG tablet TAKE ONE TABLET BY MOUTH TWICE DAILY FOR  DIABETES Patient taking differently: Take 1,000 mg by mouth 2 (two) times daily with a meal.  10/21/12   Reed, Tiffany L, DO  metoprolol tartrate (LOPRESSOR) 25 MG tablet Take 12.5 mg by mouth 2 (two) times daily. 03/06/15   [provider]  simvastatin (ZOCOR) 40 MG tablet Take 1 tablet (40 mg total) by mouth at bedtime. 10/23/17   Burnadette Pop, MD    Allergies    Patient has no known allergies.  Review of Systems   Review of Systems  Constitutional: Negative for chills and fever.  Respiratory: Negative for shortness of breath.   Cardiovascular: Negative for chest pain.  Gastrointestinal: Negative for abdominal pain, diarrhea, nausea and vomiting.   Musculoskeletal: Positive for arthralgias and gait problem. Negative for back pain and neck pain.  Neurological: Negative for dizziness, facial asymmetry, speech difficulty and headaches.    Physical Exam Updated Vital Signs BP 124/68 (BP Location: Right Arm)   Pulse 86   Temp 97.8 F (36.6 C) (Oral)   Resp 18   SpO2 98%   Physical Exam Vitals and nursing note reviewed.  Constitutional:      General: He is not in acute distress.    Appearance: He is not ill-appearing.  HENT:     Head: Normocephalic.  Eyes:     Pupils: Pupils are equal, round, and reactive to light.  Neck:     Comments: No cervical midline tenderness. Cardiovascular:     Rate and Rhythm: Normal rate and regular rhythm.     Pulses: Normal pulses.     Heart sounds: Normal heart sounds. No murmur heard. No friction rub. No gallop.   Pulmonary:     Effort: Pulmonary effort is normal.     Breath sounds: Normal breath sounds.  Abdominal:     General: Abdomen is flat. There is no distension.     Palpations: Abdomen is soft.     Tenderness: There is no abdominal tenderness. There is no guarding or rebound.  Musculoskeletal:     Cervical back: Neck supple.     Comments: RLE externally rotated and shortened. Distal pulses and sensation intact. No thoracic or lumbar midline tenderness.  Skin:    General: Skin is warm and dry.  Neurological:     General: No focal deficit present.     Mental Status: He is alert.  Psychiatric:        Mood and Affect: Mood normal.        Behavior: Behavior normal.     ED Results / Procedures / Treatments   Labs (all labs ordered are listed, but only abnormal results are displayed) Labs Reviewed  CBC WITH DIFFERENTIAL/PLATELET - Abnormal; Notable for the following components:      Result Value   RBC 3.57 (*)    Hemoglobin 10.5 (*)    HCT 33.3 (*)    Neutro Abs 7.8 (*)    All other components within normal limits  BASIC METABOLIC PANEL - Abnormal; Notable for the  following components:   Sodium 129 (*)    CO2 18 (*)    Glucose, Bld 163 (*)    All other components within normal limits  RESP  PANEL BY RT-PCR (FLU A&B, COVID) [id number]  TYPE AND SCREEN    EKG EKG Interpretation  Date/Time:  Wednesday February 29 2020 18:19:24 EST Ventricular Rate:  83 PR Interval:    QRS Duration: 124 QT Interval:  412 QTC Calculation: 484 R Axis:   90 Text Interpretation: Normal sinus rhythm Rightward axis Possible Inferior infarct , age undetermined Abnormal ECG No significant change since last tracing Confirmed by Deno Etienne [phone]) on 02/29/2020 7:17:46 PM   Radiology DG Chest 1 View  Result Date: 02/29/2020 CLINICAL DATA:  Fall with right hip pain EXAM: CHEST  1 VIEW COMPARISON:  07/04/2011, 07/20/2015 FINDINGS: Post sternotomy changes. Emphysematous disease. Postsurgical clips in the left hilar region. Linear scarring in the left mid lung. No acute airspace disease, pleural effusion, or pneumothorax. IMPRESSION: No active cardiopulmonary disease. Emphysematous disease with scarring in the left mid lung. Electronically Signed   By: Donavan Foil [name]   On: 02/29/2020 17:39   DG Hip Unilat W or Wo Pelvis 2-3 Views Right  Result Date: 02/29/2020 CLINICAL DATA:  Pain status post fall EXAM: DG HIP (WITH OR WITHOUT PELVIS) 2-3V RIGHT COMPARISON:  None. FINDINGS: There is an acute, displaced, transcervical fracture of the proximal right femur. There is no dislocation. There are degenerative changes of both hips. There are vascular calcifications. There is osteopenia. IMPRESSION: 1. Acute, displaced, transcervical fracture of the proximal right femur. 2. Degenerative changes of both hips. 3. Osteopenia. Electronically Signed   By: Constance Holster [name]   On: 02/29/2020 17:36    Procedures Procedures (including critical care time)  Medications Ordered in ED Medications  morphine 4 MG/ML injection 4 mg (has no administration in time range)  fentaNYL (SUBLIMAZE)  injection 50 mcg (50 mcg Intravenous Given 02/29/20 1821)    ED Course  I have reviewed the triage vital signs and the nursing notes.  Pertinent labs & imaging results that were available during my care of the patient were reviewed by me and considered in my medical decision making (see chart for details).  Clinical Course as of 02/29/20 Lucie Leather Feb 29, 2020  1754 Dr. Tyrone Nine spoke to Dr. Lucia Gaskins with orthopedics who recommends medical admission. Ortho will see patient and plan for surgery.  [CA]    Clinical Course User Index [CA] Suzy Bouchard, PA-C   MDM Rules/Calculators/A&P                         80 year old male presents to the ED after mechanical fall directly on right hip.  No previous right hip injury.  Denies head injury and loss of consciousness.  He is currently on Plavix and ASA 81 mg.  Upon arrival, stable vitals.  Patient in no acute distress and non-ill-appearing.  Physical exam significant for right lower extremity externally rotated and shortened.  Distal pulses and sensation intact.  No cervical, thoracic, or lumbar midline tenderness.  Abdomen soft, nondistended, nontender.  Normal neurological exam.  Will obtain x-ray to rule out hip fracture. Fentanyl given for pain.  Hip x-ray personally reviewed which demonstrates: IMPRESSION:  1. Acute, displaced, transcervical fracture of the proximal right  femur.  2. Degenerative changes of both hips.   CXR personally reviewed which demonstrates: IMPRESSION:  No active cardiopulmonary disease. Emphysematous disease with  scarring in the left mid lung.   CBC reassuring with no leukocytosis and mild anemia with hemoglobin at 10.5.  BMP significant for hyponatremia 129, hyperglycemia 163.  No anion  gap.  Doubt DKA.  Normal renal function.  EKG personally reviewed which demonstrates normal sinus rhythm with no signs of acute ischemia.  Dr. Adela Lank spoke to Dr. Susa Simmonds with orthopedics. See note above. Discussed case with Dr.  Toniann Fail with [facility] who agrees to admit patient. COVID test pending.  Final Clinical Impression(s) / ED Diagnoses Final diagnoses:  Closed right hip fracture, initial encounter [facility])    Rx / DC Orders ED Discharge Orders    None       Mannie Stabile, PA-C 02/29/20 1924    Melene Plan, DO 02/29/20 1926

## 2020-02-29 NOTE — ED Notes (Signed)
Pt in X ray

## 2020-03-01 ENCOUNTER — Inpatient Hospital Stay (HOSPITAL_COMMUNITY): Payer: Medicare (Managed Care)

## 2020-03-01 ENCOUNTER — Inpatient Hospital Stay (HOSPITAL_COMMUNITY): Payer: Medicare (Managed Care) | Admitting: Anesthesiology

## 2020-03-01 ENCOUNTER — Encounter (HOSPITAL_COMMUNITY): Payer: Self-pay | Admitting: Internal Medicine

## 2020-03-01 ENCOUNTER — Encounter (HOSPITAL_COMMUNITY)
Admission: EM | Disposition: A | Discharge status: Skilled Nursing Facility | Payer: Self-pay | Source: Home / Self Care | Attending: Family Medicine

## 2020-03-01 DIAGNOSIS — S72001A Fracture of unspecified part of neck of right femur, initial encounter for closed fracture: Secondary | ICD-10-CM | POA: Diagnosis not present

## 2020-03-01 HISTORY — PX: TOTAL HIP ARTHROPLASTY: SHX124

## 2020-03-01 LAB — CBC WITH DIFFERENTIAL/PLATELET
Abs Immature Granulocytes: 0.1 10*3/uL — ABNORMAL HIGH (ref 0.00–0.07)
Basophils Absolute: 0 10*3/uL (ref 0.0–0.1)
Basophils Relative: 0 %
Eosinophils Absolute: 0.1 10*3/uL (ref 0.0–0.5)
Eosinophils Relative: 0 %
HCT: 28.8 % — ABNORMAL LOW (ref 39.0–52.0)
Hemoglobin: 9 g/dL — ABNORMAL LOW (ref 13.0–17.0)
Immature Granulocytes: 1 %
Lymphocytes Relative: 5 %
Lymphs Abs: 0.8 10*3/uL (ref 0.7–4.0)
MCH: 29.5 pg (ref 26.0–34.0)
MCHC: 31.3 g/dL (ref 30.0–36.0)
MCV: 94.4 fL (ref 80.0–100.0)
Monocytes Absolute: 1.1 10*3/uL — ABNORMAL HIGH (ref 0.1–1.0)
Monocytes Relative: 6 %
Neutro Abs: 15.4 10*3/uL — ABNORMAL HIGH (ref 1.7–7.7)
Neutrophils Relative %: 88 %
Platelets: 388 10*3/uL (ref 150–400)
RBC: 3.05 MIL/uL — ABNORMAL LOW (ref 4.22–5.81)
RDW: 13.9 % (ref 11.5–15.5)
WBC: 17.4 10*3/uL — ABNORMAL HIGH (ref 4.0–10.5)
nRBC: 0 % (ref 0.0–0.2)

## 2020-03-01 LAB — BASIC METABOLIC PANEL
Anion gap: 12 (ref 5–15)
BUN: 17 mg/dL (ref 8–23)
CO2: 17 mmol/L — ABNORMAL LOW (ref 22–32)
Calcium: 8.5 mg/dL — ABNORMAL LOW (ref 8.9–10.3)
Chloride: 100 mmol/L (ref 98–111)
Creatinine, Ser: 1.11 mg/dL (ref 0.61–1.24)
GFR, Estimated: >60 mL/min (ref >60)
Glucose, Bld: 219 mg/dL — ABNORMAL HIGH (ref 70–99)
Potassium: 4.5 mmol/L (ref 3.5–5.1)
Sodium: 129 mmol/L — ABNORMAL LOW (ref 135–145)

## 2020-03-01 LAB — GLUCOSE, CAPILLARY
Glucose-Capillary: 185 mg/dL — ABNORMAL HIGH (ref 70–99)
Glucose-Capillary: 186 mg/dL — ABNORMAL HIGH (ref 70–99)
Glucose-Capillary: 165 mg/dL — ABNORMAL HIGH (ref 70–99)
Glucose-Capillary: 187 mg/dL — ABNORMAL HIGH (ref 70–99)
Glucose-Capillary: 229 mg/dL — ABNORMAL HIGH (ref 70–99)

## 2020-03-01 LAB — SURGICAL PCR SCREEN
MRSA, PCR: NEGATIVE
Staphylococcus aureus: NEGATIVE

## 2020-03-01 LAB — HEMOGLOBIN A1C
Hgb A1c MFr Bld: 6.7 % — ABNORMAL HIGH (ref 4.8–5.6)
Mean Plasma Glucose: 145.59 mg/dL

## 2020-03-01 SURGERY — ARTHROPLASTY, HIP, TOTAL, ANTERIOR APPROACH
Anesthesia: General | Site: Hip | Laterality: Right

## 2020-03-01 MED ORDER — KETOROLAC TROMETHAMINE 30 MG/ML IJ SOLN
INTRAMUSCULAR | Status: AC
  Filled 2020-03-01: qty 1

## 2020-03-01 MED ORDER — VASOPRESSIN 20 UNIT/ML IV SOLN
INTRAVENOUS | Status: DC | PRN
  Administered 2020-03-01: 1 [IU] via INTRAVENOUS

## 2020-03-01 MED ORDER — PHENYLEPHRINE HCL-NACL 10-0.9 MG/250ML-% IV SOLN
INTRAVENOUS | Status: DC | PRN
  Administered 2020-03-01: 25 ug/min via INTRAVENOUS

## 2020-03-01 MED ORDER — ROCURONIUM BROMIDE 10 MG/ML (PF) SYRINGE
PREFILLED_SYRINGE | INTRAVENOUS | Status: AC
  Filled 2020-03-01: qty 10

## 2020-03-01 MED ORDER — CHLORHEXIDINE GLUCONATE 4 % EX LIQD
60.0000 mL | Freq: Once | CUTANEOUS | Status: DC
  Administered 2020-03-01: 4 via TOPICAL
  Filled 2020-03-01: qty 30

## 2020-03-01 MED ORDER — PROPOFOL 10 MG/ML IV BOLUS
INTRAVENOUS | Status: DC | PRN
  Administered 2020-03-01: 60 mg via INTRAVENOUS

## 2020-03-01 MED ORDER — EPHEDRINE 5 MG/ML INJ
INTRAVENOUS | Status: AC
  Filled 2020-03-01: qty 10

## 2020-03-01 MED ORDER — CEFAZOLIN SODIUM-DEXTROSE 2-4 GM/100ML-% IV SOLN
2.0000 g | INTRAVENOUS | Status: AC
  Administered 2020-03-01: 2 g via INTRAVENOUS
  Filled 2020-03-01: qty 100

## 2020-03-01 MED ORDER — KETOROLAC TROMETHAMINE 30 MG/ML IJ SOLN
INTRAMUSCULAR | Status: DC | PRN
  Administered 2020-03-01: 30 mg

## 2020-03-01 MED ORDER — DEXTROSE 5 % IV SOLN: 3.0000 g | INTRAVENOUS | Status: DC

## 2020-03-01 MED ORDER — PHENYLEPHRINE 40 MCG/ML (10ML) SYRINGE FOR IV PUSH (FOR BLOOD PRESSURE SUPPORT)
PREFILLED_SYRINGE | INTRAVENOUS | Status: DC | PRN
  Administered 2020-03-01: 160 ug via INTRAVENOUS
  Administered 2020-03-01: 240 ug via INTRAVENOUS

## 2020-03-01 MED ORDER — VASOPRESSIN 20 UNIT/ML IV SOLN
INTRAVENOUS | Status: AC
  Filled 2020-03-01: qty 1

## 2020-03-01 MED ORDER — ACETAMINOPHEN 500 MG PO TABS: 1000.0000 mg | ORAL_TABLET | Freq: Once | ORAL | Status: DC

## 2020-03-01 MED ORDER — ADULT MULTIVITAMIN W/MINERALS CH
1.0000 | ORAL_TABLET | Freq: Every day | ORAL | Status: DC
  Administered 2020-03-01 – 2020-03-07 (×7): 1 via ORAL
  Filled 2020-03-01 (×7): qty 1

## 2020-03-01 MED ORDER — CHLORHEXIDINE GLUCONATE 0.12 % MT SOLN
15.0000 mL | OROMUCOSAL | Status: AC
  Administered 2020-03-01: 15 mL via OROMUCOSAL
  Filled 2020-03-01: qty 15

## 2020-03-01 MED ORDER — ONDANSETRON HCL 4 MG/2ML IJ SOLN
INTRAMUSCULAR | Status: DC | PRN
  Administered 2020-03-01: 4 mg via INTRAVENOUS

## 2020-03-01 MED ORDER — LIDOCAINE 2% (20 MG/ML) 5 ML SYRINGE
INTRAMUSCULAR | Status: DC | PRN
  Administered 2020-03-01: 60 mg via INTRAVENOUS

## 2020-03-01 MED ORDER — GLUCERNA SHAKE PO LIQD
237.0000 mL | Freq: Three times a day (TID) | ORAL | Status: DC
  Administered 2020-03-02 – 2020-03-07 (×12): 237 mL via ORAL

## 2020-03-01 MED ORDER — 0.9 % SODIUM CHLORIDE (POUR BTL) OPTIME
TOPICAL | Status: DC | PRN
  Administered 2020-03-01: 1000 mL

## 2020-03-01 MED ORDER — CLOPIDOGREL BISULFATE 75 MG PO TABS
75.0000 mg | ORAL_TABLET | Freq: Every day | ORAL | Status: DC
  Administered 2020-03-02 – 2020-03-07 (×6): 75 mg via ORAL
  Filled 2020-03-01 (×7): qty 1

## 2020-03-01 MED ORDER — BUPIVACAINE-EPINEPHRINE (PF) 0.5% -1:200000 IJ SOLN
INTRAMUSCULAR | Status: DC | PRN
  Administered 2020-03-01: 30 mL

## 2020-03-01 MED ORDER — ONDANSETRON HCL 4 MG/2ML IJ SOLN
4.0000 mg | Freq: Four times a day (QID) | INTRAMUSCULAR | Status: DC | PRN
  Administered 2020-03-01: 4 mg via INTRAVENOUS
  Filled 2020-03-01: qty 2

## 2020-03-01 MED ORDER — SODIUM CHLORIDE 0.9 % IV BOLUS: 500.0000 mL | Freq: Once | INTRAVENOUS | Status: DC

## 2020-03-01 MED ORDER — SODIUM CHLORIDE (PF) 0.9 % IJ SOLN
INTRAMUSCULAR | Status: DC | PRN
  Administered 2020-03-01: 30 mL

## 2020-03-01 MED ORDER — ROCURONIUM BROMIDE 10 MG/ML (PF) SYRINGE
PREFILLED_SYRINGE | INTRAVENOUS | Status: DC | PRN
  Administered 2020-03-01: 60 mg via INTRAVENOUS

## 2020-03-01 MED ORDER — PROPOFOL 10 MG/ML IV BOLUS
INTRAVENOUS | Status: AC
  Filled 2020-03-01: qty 20

## 2020-03-01 MED ORDER — FENTANYL CITRATE (PF) 250 MCG/5ML IJ SOLN
INTRAMUSCULAR | Status: DC | PRN
  Administered 2020-03-01: 75 ug via INTRAVENOUS
  Administered 2020-03-01: 25 ug via INTRAVENOUS

## 2020-03-01 MED ORDER — POVIDONE-IODINE 10 % EX SWAB: 2.0000 "application " | Freq: Once | CUTANEOUS | Status: DC

## 2020-03-01 MED ORDER — AMISULPRIDE (ANTIEMETIC) 5 MG/2ML IV SOLN: 10.0000 mg | Freq: Once | INTRAVENOUS | Status: DC | PRN

## 2020-03-01 MED ORDER — SUGAMMADEX SODIUM 200 MG/2ML IV SOLN
INTRAVENOUS | Status: DC | PRN
  Administered 2020-03-01: 200 mg via INTRAVENOUS

## 2020-03-01 MED ORDER — EPHEDRINE SULFATE-NACL 50-0.9 MG/10ML-% IV SOSY
PREFILLED_SYRINGE | INTRAVENOUS | Status: DC | PRN
  Administered 2020-03-01: 15 mg via INTRAVENOUS

## 2020-03-01 MED ORDER — ONDANSETRON HCL 4 MG/2ML IJ SOLN
INTRAMUSCULAR | Status: AC
  Filled 2020-03-01: qty 2

## 2020-03-01 MED ORDER — ACETAMINOPHEN 325 MG PO TABS
650.0000 mg | ORAL_TABLET | Freq: Four times a day (QID) | ORAL | Status: DC | PRN
  Administered 2020-03-01: 650 mg via ORAL
  Filled 2020-03-01: qty 2

## 2020-03-01 MED ORDER — FENTANYL CITRATE (PF) 100 MCG/2ML IJ SOLN: 25.0000 ug | INTRAMUSCULAR | Status: DC | PRN

## 2020-03-01 MED ORDER — SODIUM CHLORIDE 0.9 % IR SOLN
Status: DC | PRN
  Administered 2020-03-01: 3000 mL

## 2020-03-01 MED ORDER — ARTIFICIAL TEARS OPHTHALMIC OINT
TOPICAL_OINTMENT | OPHTHALMIC | Status: AC
  Filled 2020-03-01: qty 3.5

## 2020-03-01 MED ORDER — LACTATED RINGERS IV SOLN: INTRAVENOUS | Status: DC

## 2020-03-01 MED ORDER — PHENYLEPHRINE 40 MCG/ML (10ML) SYRINGE FOR IV PUSH (FOR BLOOD PRESSURE SUPPORT)
PREFILLED_SYRINGE | INTRAVENOUS | Status: AC
  Filled 2020-03-01: qty 10

## 2020-03-01 MED ORDER — SODIUM CHLORIDE 0.9 % IV BOLUS
1000.0000 mL | Freq: Once | INTRAVENOUS | Status: AC
  Administered 2020-03-01: 1000 mL via INTRAVENOUS

## 2020-03-01 MED ORDER — FENTANYL CITRATE (PF) 250 MCG/5ML IJ SOLN
INTRAMUSCULAR | Status: AC
  Filled 2020-03-01: qty 5

## 2020-03-01 MED ORDER — TRANEXAMIC ACID-NACL 1000-0.7 MG/100ML-% IV SOLN
1000.0000 mg | INTRAVENOUS | Status: AC
  Administered 2020-03-01: 1000 mg via INTRAVENOUS
  Filled 2020-03-01: qty 100

## 2020-03-01 MED ORDER — LIDOCAINE 2% (20 MG/ML) 5 ML SYRINGE
INTRAMUSCULAR | Status: AC
  Filled 2020-03-01: qty 5

## 2020-03-01 MED ORDER — BUPIVACAINE-EPINEPHRINE 0.5% -1:200000 IJ SOLN
INTRAMUSCULAR | Status: AC
  Filled 2020-03-01: qty 1

## 2020-03-01 SURGICAL SUPPLY — 73 items
ACETAB CUP W/GRIPTION 54 (Plate) ×2 IMPLANT
ADH SKN CLS APL DERMABOND .7 (GAUZE/BANDAGES/DRESSINGS) ×2
ADH SKN CLS LQ APL DERMABOND (GAUZE/BANDAGES/DRESSINGS) ×1
ALCOHOL 70% 16 OZ (MISCELLANEOUS) ×2 IMPLANT
APL PRP STRL LF DISP 70% ISPRP (MISCELLANEOUS) ×1
ARTICULEZE HEAD (Hips) ×2 IMPLANT
BLADE CLIPPER SURG (BLADE) IMPLANT
CHLORAPREP W/TINT 26 (MISCELLANEOUS) ×2 IMPLANT
COVER SURGICAL LIGHT HANDLE (MISCELLANEOUS) ×2 IMPLANT
COVER WAND RF STERILE (DRAPES) ×2 IMPLANT
CUP ACETAB W/GRIPTION 54 (Plate) IMPLANT
DERMABOND ADHESIVE PROPEN (GAUZE/BANDAGES/DRESSINGS) ×1
DERMABOND ADVANCED (GAUZE/BANDAGES/DRESSINGS) ×2
DERMABOND ADVANCED .7 DNX12 (GAUZE/BANDAGES/DRESSINGS) ×2 IMPLANT
DERMABOND ADVANCED .7 DNX6 (GAUZE/BANDAGES/DRESSINGS) IMPLANT
DRAPE C-ARM 42X72 X-RAY (DRAPES) ×2 IMPLANT
DRAPE STERI IOBAN 125X83 (DRAPES) ×2 IMPLANT
DRAPE U-SHAPE 47X51 STRL (DRAPES) ×6 IMPLANT
DRSG AQUACEL AG ADV 3.5X10 (GAUZE/BANDAGES/DRESSINGS) ×2 IMPLANT
ELECT BLADE 4.0 EZ CLEAN MEGAD (MISCELLANEOUS) ×2
ELECT PENCIL ROCKER SW 15FT (MISCELLANEOUS) ×2 IMPLANT
ELECT REM PT RETURN 9FT ADLT (ELECTROSURGICAL) ×2
ELECTRODE BLDE 4.0 EZ CLN MEGD (MISCELLANEOUS) ×1 IMPLANT
ELECTRODE REM PT RTRN 9FT ADLT (ELECTROSURGICAL) ×1 IMPLANT
EVACUATOR 1/8 PVC DRAIN (DRAIN) IMPLANT
GLOVE BIO SURGEON STRL SZ8.5 (GLOVE) ×4 IMPLANT
GLOVE BIOGEL M 7.0 STRL (GLOVE) ×2 IMPLANT
GLOVE BIOGEL PI IND STRL 7.5 (GLOVE) ×1 IMPLANT
GLOVE BIOGEL PI IND STRL 8.5 (GLOVE) ×1 IMPLANT
GLOVE BIOGEL PI INDICATOR 7.5 (GLOVE) ×1
GLOVE BIOGEL PI INDICATOR 8.5 (GLOVE) ×1
GOWN STRL REUS W/ TWL LRG LVL3 (GOWN DISPOSABLE) ×2 IMPLANT
GOWN STRL REUS W/ TWL XL LVL3 (GOWN DISPOSABLE) ×1 IMPLANT
GOWN STRL REUS W/TWL 2XL LVL3 (GOWN DISPOSABLE) ×2 IMPLANT
GOWN STRL REUS W/TWL LRG LVL3 (GOWN DISPOSABLE) ×4
GOWN STRL REUS W/TWL XL LVL3 (GOWN DISPOSABLE) ×2
HANDPIECE INTERPULSE COAX TIP (DISPOSABLE) ×2
HEAD ARTICULEZE (Hips) IMPLANT
HOOD PEEL AWAY FACE SHEILD DIS (HOOD) ×4 IMPLANT
JET LAVAGE IRRISEPT WOUND (IRRIGATION / IRRIGATOR) ×2
KIT BASIN OR (CUSTOM PROCEDURE TRAY) ×2 IMPLANT
KIT TURNOVER KIT B (KITS) ×2 IMPLANT
LAVAGE JET IRRISEPT WOUND (IRRIGATION / IRRIGATOR) ×1 IMPLANT
LINER NEUTRAL 36ID 54OD (Liner) ×1 IMPLANT
MANIFOLD NEPTUNE II (INSTRUMENTS) ×2 IMPLANT
MARKER SKIN DUAL TIP RULER LAB (MISCELLANEOUS) ×4 IMPLANT
NDL SPNL 18GX3.5 QUINCKE PK (NEEDLE) ×1 IMPLANT
NEEDLE SPNL 18GX3.5 QUINCKE PK (NEEDLE) ×2 IMPLANT
NS IRRIG 1000ML POUR BTL (IV SOLUTION) ×2 IMPLANT
PACK TOTAL JOINT (CUSTOM PROCEDURE TRAY) ×2 IMPLANT
PACK UNIVERSAL I (CUSTOM PROCEDURE TRAY) ×2 IMPLANT
PAD ARMBOARD 7.5X6 YLW CONV (MISCELLANEOUS) ×4 IMPLANT
SAW OSC TIP CART 19.5X105X1.3 (SAW) ×2 IMPLANT
SEALER BIPOLAR AQUA 6.0 (INSTRUMENTS) IMPLANT
SET HNDPC FAN SPRY TIP SCT (DISPOSABLE) ×1 IMPLANT
SOL PREP POV-IOD 4OZ 10% (MISCELLANEOUS) ×2 IMPLANT
STEM TRI LOC BPS SZ8 W GRIPTON IMPLANT
SUT ETHIBOND NAB CT1 #1 30IN (SUTURE) ×4 IMPLANT
SUT MNCRL AB 3-0 PS2 18 (SUTURE) ×2 IMPLANT
SUT MON AB 2-0 CT1 36 (SUTURE) ×2 IMPLANT
SUT VIC AB 1 CT1 27 (SUTURE) ×2
SUT VIC AB 1 CT1 27XBRD ANBCTR (SUTURE) ×1 IMPLANT
SUT VIC AB 2-0 CT1 27 (SUTURE) ×2
SUT VIC AB 2-0 CT1 TAPERPNT 27 (SUTURE) ×1 IMPLANT
SUT VLOC 180 0 24IN GS25 (SUTURE) ×2 IMPLANT
SYR 50ML LL SCALE MARK (SYRINGE) ×2 IMPLANT
TOWEL GREEN STERILE (TOWEL DISPOSABLE) ×2 IMPLANT
TOWEL GREEN STERILE FF (TOWEL DISPOSABLE) ×2 IMPLANT
TRAY CATH 16FR W/PLASTIC CATH (SET/KITS/TRAYS/PACK) IMPLANT
TRAY FOLEY W/BAG SLVR 16FR (SET/KITS/TRAYS/PACK)
TRAY FOLEY W/BAG SLVR 16FR ST (SET/KITS/TRAYS/PACK) IMPLANT
TRI LOC BPS SZ8 W GRIPTON ×2 IMPLANT
WATER STERILE IRR 1000ML POUR (IV SOLUTION) ×6 IMPLANT

## 2020-03-01 NOTE — Consult Note (Signed)
Reason for Consult:Right hip fx Referring Physician: R Pahwani Time called: 0730 Time at bedside: 0912   Daniel Tyler is an 80 y.o. male.  HPI: [name] was walking through his front door and tripped on the threshold. He fell and had immediate right hip pain and could not get up. He was brought to the ED where x-rays showed a right hip fx and orthopedic surgery was consulted. He c/o localized pain to the hip. He lives alone but has someone come in to help and usually ambulates with a RW.  Past Medical History:  Diagnosis Date  . Anemia   . Carotid stenosis   . Coronary artery disease   . Depression    bc of foot pain but no meds required  . Diabetes mellitus    takes Glipizide and Metformin daily;Lantus nightly  . Enlarged heart   . Hyperlipidemia   . Myocardial infarction (HCC) 2013  . Pneumonia 2013   hx of  . Skin spots-aging   . Weakness of right leg     Past Surgical History:  Procedure Laterality Date  . ABDOMINAL AORTAGRAM    . ABDOMINAL AORTAGRAM N/A 07/09/2012   Procedure: ABDOMINAL Ronny Flurry;  Surgeon: Sherren Kerns, MD;  Location: [facility] CATH LAB;  Service: Cardiovascular;  Laterality: N/A;  . ABDOMINAL AORTOGRAM W/LOWER EXTREMITY N/A 10/19/2017   Procedure: ABDOMINAL AORTOGRAM W/LOWER EXTREMITY;  Surgeon: Cephus Shelling, MD;  Location: [facility] INVASIVE CV LAB;  Service: Cardiovascular;  Laterality: N/A;  . AMPUTATION Right 07/14/2012   Procedure: AMPUTATION DIGIT;  Surgeon: Sherren Kerns, MD;  Location: [facility] OR;  Service: Vascular;  Laterality: Right;  Amputation Right Fourth Toe  . AMPUTATION Right 10/20/2017   Procedure: AMPUTATION DIGIT;  Surgeon: Maeola Harman, MD;  Location: [facility] OR;  Service: Vascular;  Laterality: Right;  . CARDIAC CATHETERIZATION  2013  . CAROTID ENDARTERECTOMY  07/01/11   Right CEA  . COLONOSCOPY    . CORONARY ARTERY BYPASS GRAFT  07/01/2011   Procedure: CORONARY ARTERY BYPASS GRAFTING (CABG);  Surgeon: Loreli Slot, MD;   Location: [facility] OR;  Service: Open Heart Surgery;  Laterality: N/A;  . ENDARTERECTOMY  07/01/2011   Procedure: ENDARTERECTOMY CAROTID;  Surgeon: Sherren Kerns, MD;  Location: [facility] OR;  Service: Vascular;  Laterality: Right;  . FEMORAL-POPLITEAL BYPASS GRAFT Right 07/14/2012   Procedure: BYPASS GRAFT FEMORAL-POPLITEAL ARTERY;  Surgeon: Sherren Kerns, MD;  Location: [facility] OR;  Service: Vascular;  Laterality: Right;  Right Femoral Popliteal Bypass with Left Leg Nonreversed Greater Saphenous Vein  . INTRAOPERATIVE ARTERIOGRAM Right 07/14/2012   Procedure: INTRA OPERATIVE ARTERIOGRAM;  Surgeon: Sherren Kerns, MD;  Location: [facility] OR;  Service: Vascular;  Laterality: Right;  . LEFT HEART CATHETERIZATION WITH CORONARY ANGIOGRAM N/A 04/24/2011   Procedure: LEFT HEART CATHETERIZATION WITH CORONARY ANGIOGRAM;  Surgeon: Othella Boyer, MD;  Location: [facility] CATH LAB;  Service: Cardiovascular;  Laterality: N/A;  . PERIPHERAL VASCULAR BALLOON ANGIOPLASTY Right 10/19/2017   Procedure: PERIPHERAL VASCULAR BALLOON ANGIOPLASTY;  Surgeon: Cephus Shelling, MD;  Location: [facility] INVASIVE CV LAB;  Service: Cardiovascular;  Laterality: Right;  peroneal  . PERIPHERAL VASCULAR INTERVENTION Right 10/19/2017   Procedure: PERIPHERAL VASCULAR INTERVENTION;  Surgeon: Cephus Shelling, MD;  Location: [facility] INVASIVE CV LAB;  Service: Cardiovascular;  Laterality: Right;  common iliac    Family History  Problem Relation Age of Onset  . Heart disease Father   . Cancer Brother   . Diabetes Brother   . Diabetes Daughter   .  Anesthesia problems Neg Hx   . Hypotension Neg Hx   . Malignant hyperthermia Neg Hx   . Pseudochol deficiency Neg Hx     Social History:  reports that he quit smoking about 42 years ago. His smoking use included cigarettes. He has a 20.00 pack-year smoking history. He has never used smokeless tobacco. He reports that he does not drink alcohol and does not use drugs.  Allergies: No Known Allergies  Medications:  I have reviewed the patient's current medications.  Results for orders placed or performed during the hospital encounter of 02/29/20 (from the past 48 hour(s))  CBC with Differential     Status: Abnormal   Collection Time: 02/29/20  5:20 PM  Result Value Ref Range   WBC 9.3 4.0 - 10.5 K/uL   RBC 3.57 (L) 4.22 - 5.81 MIL/uL   Hemoglobin 10.5 (L) 13.0 - 17.0 g/dL   HCT 16.1 (L) 09.6 - 04.5 %   MCV 93.3 80.0 - 100.0 fL   MCH 29.4 26.0 - 34.0 pg   MCHC 31.5 30.0 - 36.0 g/dL   RDW 40.9 81.1 - 91.4 %   Platelets 327 150 - 400 K/uL   nRBC 0.0 0.0 - 0.2 %   Neutrophils Relative % 84 %   Neutro Abs 7.8 (H) 1.7 - 7.7 K/uL   Lymphocytes Relative 9 %   Lymphs Abs 0.8 0.7 - 4.0 K/uL   Monocytes Relative 6 %   Monocytes Absolute 0.6 0.1 - 1.0 K/uL   Eosinophils Relative 1 %   Eosinophils Absolute 0.1 0.0 - 0.5 K/uL   Basophils Relative 0 %   Basophils Absolute 0.0 0.0 - 0.1 K/uL   Immature Granulocytes 0 %   Abs Immature Granulocytes 0.04 0.00 - 0.07 K/uL    Comment: Performed at [facility], [address] [place], [place] [address]  Basic metabolic panel     Status: Abnormal   Collection Time: 02/29/20  5:20 PM  Result Value Ref Range   Sodium 129 (L) 135 - 145 mmol/L   Potassium 4.2 3.5 - 5.1 mmol/L   Chloride 100 98 - 111 mmol/L   CO2 18 (L) 22 - 32 mmol/L   Glucose, Bld 163 (H) 70 - 99 mg/dL    Comment: Glucose reference range applies only to samples taken after fasting for at least 8 hours.   BUN 16 8 - 23 mg/dL   Creatinine, Ser 6.21 0.61 - 1.24 mg/dL   Calcium 9.1 8.9 - 30.8 mg/dL   GFR, Estimated >65 >78 mL/min    Comment: (NOTE) Calculated using the CKD-EPI Creatinine Equation (2021)    Anion gap 11 5 - 15    Comment: Performed at [facility], [address], [place], [place] [address]  Type and screen [facility]     Status: None   Collection Time: 02/29/20  5:32 PM  Result Value Ref Range   ABO/RH(D) A POS    Antibody Screen NEG     Sample Expiration      03/03/2020,2359 Performed at [facility], [address], [place], [place] [address]   Resp Panel by RT-PCR (Flu A&B, Covid) Nasopharyngeal Swab     Status: None   Collection Time: 02/29/20  6:24 PM   Specimen: Nasopharyngeal Swab; Nasopharyngeal(NP) swabs in vial transport medium  Result Value Ref Range   SARS Coronavirus 2 by RT PCR NEGATIVE NEGATIVE    Comment: (NOTE) SARS-CoV-2 target nucleic acids are  NOT DETECTED.  The SARS-CoV-2 RNA is generally detectable in upper respiratory specimens during the acute phase of infection. The lowest concentration of SARS-CoV-2 viral copies this assay can detect is 138 copies/mL. A negative result does not preclude SARS-Cov-2 infection and should not be used as the sole basis for treatment or other patient management decisions. A negative result may occur with  improper specimen collection/handling, submission of specimen other than nasopharyngeal swab, presence of viral mutation(s) within the areas targeted by this assay, and inadequate number of viral copies(<138 copies/mL). A negative result must be combined with clinical observations, patient history, and epidemiological information. The expected result is Negative.  Fact Sheet for Patients:  BloggerCourse.com  Fact Sheet for Healthcare Providers:  SeriousBroker.it  This test is no t yet approved or cleared by the [place] FDA and  has been authorized for detection and/or diagnosis of SARS-CoV-2 by FDA under an Emergency Use Authorization (EUA). This EUA will remain  in effect (meaning this test can be used) for the duration of the COVID-19 declaration under Section 564(b)(1) of the Act, 21 U.S.C.section 360bbb-3(b)(1), unless the authorization is terminated  or revoked sooner.       Influenza A by PCR NEGATIVE NEGATIVE   Influenza B by PCR NEGATIVE NEGATIVE    Comment: (NOTE) The Xpert Xpress  SARS-CoV-2/FLU/RSV plus assay is intended as an aid in the diagnosis of influenza from Nasopharyngeal swab specimens and should not be used as a sole basis for treatment. Nasal washings and aspirates are unacceptable for Xpert Xpress SARS-CoV-2/FLU/RSV testing.  Fact Sheet for Patients: BloggerCourse.com  Fact Sheet for Healthcare Providers: SeriousBroker.it  This test is not yet approved or cleared by the [place] FDA and has been authorized for detection and/or diagnosis of SARS-CoV-2 by FDA under an Emergency Use Authorization (EUA). This EUA will remain in effect (meaning this test can be used) for the duration of the COVID-19 declaration under Section 564(b)(1) of the Act, 21 U.S.C. section 360bbb-3(b)(1), unless the authorization is terminated or revoked.  Performed at [facility], [address], [place], [place] [address]   Surgical pcr screen     Status: None   Collection Time: 03/01/20  1:16 AM   Specimen: Nasal Mucosa; Nasal Swab  Result Value Ref Range   MRSA, PCR NEGATIVE NEGATIVE   Staphylococcus aureus NEGATIVE NEGATIVE    Comment: (NOTE) The Xpert SA Assay (FDA approved for NASAL specimens in patients 22 years of age and older), is one component of a comprehensive surveillance program. It is not intended to diagnose infection nor to guide or monitor treatment. Performed at [facility], [address] [place], [place] [address]   Glucose, capillary     Status: Abnormal   Collection Time: 03/01/20  3:05 AM  Result Value Ref Range   Glucose-Capillary 185 (H) 70 - 99 mg/dL    Comment: Glucose reference range applies only to samples taken after fasting for at least 8 hours.  Glucose, capillary     Status: Abnormal   Collection Time: 03/01/20  7:55 AM  Result Value Ref Range   Glucose-Capillary 186 (H) 70 - 99 mg/dL    Comment: Glucose reference range applies only to samples taken after  fasting for at least 8 hours.    DG Chest 1 View  Result Date: 02/29/2020 CLINICAL DATA:  Fall with right hip pain EXAM: CHEST  1 VIEW COMPARISON:  07/04/2011, 07/20/2015 FINDINGS: Post sternotomy changes. Emphysematous disease. Postsurgical clips in the  left hilar region. Linear scarring in the left mid lung. No acute airspace disease, pleural effusion, or pneumothorax. IMPRESSION: No active cardiopulmonary disease. Emphysematous disease with scarring in the left mid lung. Electronically Signed   By: Donavan Foil [name]   On: 02/29/2020 17:39   DG Knee 1-2 Views Right  Result Date: 03/01/2020 CLINICAL DATA:  Right hip fracture EXAM: RIGHT KNEE - 1-2 VIEW COMPARISON:  None. FINDINGS: Normal alignment no fracture.  Joint spaces normal. Surgical clips in the posterior soft tissues. Arterial calcification IMPRESSION: Normal knee. Atherosclerotic disease. Electronically Signed   By: Franchot Gallo [name]   On: 03/01/2020 09:01   DG Hip Unilat W or Wo Pelvis 2-3 Views Right  Result Date: 02/29/2020 CLINICAL DATA:  Pain status post fall EXAM: DG HIP (WITH OR WITHOUT PELVIS) 2-3V RIGHT COMPARISON:  None. FINDINGS: There is an acute, displaced, transcervical fracture of the proximal right femur. There is no dislocation. There are degenerative changes of both hips. There are vascular calcifications. There is osteopenia. IMPRESSION: 1. Acute, displaced, transcervical fracture of the proximal right femur. 2. Degenerative changes of both hips. 3. Osteopenia. Electronically Signed   By: Constance Holster [name]   On: 02/29/2020 17:36    Review of Systems  HENT: Negative for ear discharge, ear pain, hearing loss and tinnitus.   Eyes: Negative for photophobia and pain.  Respiratory: Negative for cough and shortness of breath.   Cardiovascular: Negative for chest pain.  Gastrointestinal: Negative for abdominal pain, nausea and vomiting.  Genitourinary: Negative for dysuria, flank pain, frequency and urgency.   Musculoskeletal: Positive for arthralgias (Right hip). Negative for back pain, myalgias and neck pain.  Neurological: Negative for dizziness and headaches.  Hematological: Does not bruise/bleed easily.  Psychiatric/Behavioral: The patient is not nervous/anxious.    Blood pressure (!) 150/64, pulse 85, temperature 98.5 F (36.9 C), temperature source Oral, resp. rate 17, SpO2 90 %. Physical Exam Constitutional:      General: He is not in acute distress.    Appearance: He is well-developed and well-nourished. He is not diaphoretic.  HENT:     Head: Normocephalic and atraumatic.  Eyes:     General: No scleral icterus.       Right eye: No discharge.        Left eye: No discharge.     Conjunctiva/sclera: Conjunctivae normal.  Cardiovascular:     Rate and Rhythm: Normal rate and regular rhythm.  Pulmonary:     Effort: Pulmonary effort is normal. No respiratory distress.  Musculoskeletal:     Cervical back: Normal range of motion.     Comments: RLE No traumatic wounds, ecchymosis, or rash  Mod TTP hip  No knee or ankle effusion  Knee stable to varus/ valgus and anterior/posterior stress  Sens DPN, SPN, TN intact  Motor EHL, ext, flex, evers 4/5, hallux surgically absent  DP 1+, PT 1+, No significant edema  Skin:    General: Skin is warm and dry.  Neurological:     Mental Status: He is alert.  Psychiatric:        Mood and Affect: Mood and affect normal.        Behavior: Behavior normal.     Assessment/Plan: Right hip fx -- Plan THA later today by Dr. Lyla Glassing. Please keep NPO. Multiple medical problems including CAD status post CABG, peripheral vascular disease status post femoropopliteal bypass history of carotid endarterectomy, anemia, and diabetes mellitus type 2 -- per primary service    Christian Mate.  Margaretha Glassing Orthopedic Surgery [phone] 03/01/2020, 9:21 AM

## 2020-03-01 NOTE — Op Note (Signed)
OPERATIVE REPORT  SURGEON: Samson Frederic, MD   ASSISTANT: Barrie Dunker, PA-C  PREOPERATIVE DIAGNOSIS: Displaced Right femoral neck fracture.   POSTOPERATIVE DIAGNOSIS: Displaced Right femoral neck fracture.   PROCEDURE: Right total hip arthroplasty, anterior approach.   IMPLANTS: DePuy Tri Lock stem, size 8, hi offset. DePuy Pinnacle Cup, size 54 mm. DePuy Altrx liner, size 36 by 54 mm, neutral. DePuy metal head ball, size 36 + 5 mm.  ANESTHESIA:  General  ANTIBIOTICS: 2g ancef.  ESTIMATED BLOOD LOSS:-100 mL    DRAINS: None.  COMPLICATIONS: None   CONDITION: PACU - hemodynamically stable.   BRIEF CLINICAL NOTE: Daniel Tyler is a 80 y.o. male with a displaced Right femoral neck fracture. The patient was admitted to the hospitalist service and underwent perioperative risk stratification and medical optimization. The risks, benefits, and alternatives to total hip arthroplasty were explained, and the patient elected to proceed.  PROCEDURE IN DETAIL: The patient was taken to the operating room and general anesthesia was induced on the hospital bed.  The patient was then positioned on the Hana table.  All bony prominences were well padded.  The hip was prepped and draped in the normal sterile surgical fashion.  A time-out was called verifying side and site of surgery. Antibiotics were given within 60 minutes of beginning the procedure.  The direct anterior approach to the hip was performed through the Hueter interval.  Lateral femoral circumflex vessels were treated with the Auqumantys. The anterior capsule was exposed and an inverted T capsulotomy was made.  Fracture hematoma was encountered and evacuated. The patient was found to have a comminuted Right subcapital femoral neck fracture.  I freshened the femoral neck cut with a saw.  I removed the femoral neck fragment.  A corkscrew was placed into the head and the head was removed.  This was passed to the back table and was  measured.   Acetabular exposure was achieved, and the pulvinar and labrum were excised. Sequential reaming of the acetabulum was then performed up to a size 53 mm reamer. A 54 mm cup was then opened and impacted into place at approximately 40 degrees of abduction and 20 degrees of anteversion. The final polyethylene liner was impacted into place.    I then gained femoral exposure taking care to protect the abductors and greater trochanter.  This was performed using standard external rotation, extension, and adduction.  The capsule was peeled off the inner aspect of the greater trochanter, taking care to preserve the short external rotators. A cookie cutter was used to enter the femoral canal, and then the femoral canal finder was used to confirm location.  I then sequentially broached up to a size 8.  Calcar planer was used on the femoral neck remnant.  I paced a hi neck and a trial head ball. The hip was reduced.  Leg lengths were checked fluoroscopically.  The hip was dislocated and trial components were removed.  I placed the real stem followed by the real spacer and head ball.  The hip was reduced.  Fluoroscopy was used to confirm component position and leg lengths.  At 90 degrees of external rotation and extension, the hip was stable to an anterior directed force.   The wound was copiously irrigated with Irrisept solution and normal saline using pule lavage.  Marcaine solution was injected into the periarticular soft tissue.  The wound was closed in layers using #1 Vicryl and V-Loc for the fascia, 2-0 Vicryl for the subcutaneous fat, 2-0  Monocryl for the deep dermal layer, 3-0 running Monocryl subcuticular stitch and glue for the skin.  Once the glue was fully dried, an Aquacell Ag dressing was applied.  The patient was then awakened from anesthesia and transported to the recovery room in stable condition.  Sponge, needle, and instrument counts were correct at the end of the case x2.  The patient  tolerated the procedure well and there were no known complications.  Please note that a surgical assistant was a medical necessity for this procedure to perform it in a safe and expeditious manner. Assistant was necessary to provide appropriate retraction of vital neurovascular structures, to prevent femoral fracture, and to allow for anatomic placement of the prosthesis.

## 2020-03-01 NOTE — Anesthesia Preprocedure Evaluation (Addendum)
Anesthesia Evaluation  Patient identified by MRN, date of birth, ID band Patient awake    Reviewed: Allergy & Precautions, NPO status , Patient's Chart, lab work & pertinent test results  Airway Mallampati: III  TM Distance: >3 FB Neck ROM: Full    Dental  (+) Edentulous Upper, Edentulous Lower, Dental Advisory Given   Pulmonary COPD, former smoker,    breath sounds clear to auscultation       Cardiovascular hypertension, Pt. on medications and Pt. on home beta blockers + CAD, + Past MI, + CABG (2013) and + Peripheral Vascular Disease   Rhythm:Regular Rate:Normal  TTE 2017 - Left ventricle: mid and basal inferior wall hypokinesis. Thecavity size was mildly dilated. Wall thickness was normal.Systolic function was normal. The estimated ejection fraction was in the range of 50% to 55%. Left ventricular diastolic function parameters were normal.  - Mitral valve: Mild eccentric likely ischemic MR.  - Atrial septum: No defect or patent foramen ovale was identified   Neuro/Psych PSYCHIATRIC DISORDERS Anxiety Depression  Neuromuscular disease    GI/Hepatic negative GI ROS, Neg liver ROS,   Endo/Other  diabetes, Type 2, Oral Hypoglycemic Agents  Renal/GU Renal disease     Musculoskeletal   Abdominal   Peds  Hematology  (+) Blood dyscrasia (on plavix, last taken 02/29/20), anemia ,   Anesthesia Other Findings   Reproductive/Obstetrics                           Lab Results  Component Value Date   WBC 9.3 02/29/2020   HGB 10.5 (L) 02/29/2020   HCT 33.3 (L) 02/29/2020   MCV 93.3 02/29/2020   PLT 327 02/29/2020   Lab Results  Component Value Date   CREATININE 1.03 02/29/2020   BUN 16 02/29/2020   NA 129 (L) 02/29/2020   K 4.2 02/29/2020   CL 100 02/29/2020   CO2 18 (L) 02/29/2020    Anesthesia Physical Anesthesia Plan  ASA: III  Anesthesia Plan: General   Post-op Pain Management:     Induction: Intravenous  PONV Risk Score and Plan: 2 and Dexamethasone, Ondansetron and Treatment may vary due to age or medical condition  Airway Management Planned: Oral ETT  Additional Equipment:   Intra-op Plan:   Post-operative Plan: Extubation in OR  Informed Consent: I have reviewed the patients History and Physical, chart, labs and discussed the procedure including the risks, benefits and alternatives for the proposed anesthesia with the patient or authorized representative who has indicated his/her understanding and acceptance.   Patient has DNR.  Suspend DNR and Discussed DNR with patient.   Dental advisory given  Plan Discussed with:   Anesthesia Plan Comments:       Anesthesia Quick Evaluation

## 2020-03-01 NOTE — H&P (View-Only) (Signed)
Reason for Consult:Right hip fx Referring Physician: R Pahwani Time called: 0730 Time at bedside: 0912   Daniel Tyler is an 80 y.o. male.  HPI: Daniel Tyler was walking through his front door and tripped on the threshold. He fell and had immediate right hip pain and could not get up. He was brought to the ED where x-rays showed a right hip fx and orthopedic surgery was consulted. He c/o localized pain to the hip. He lives alone but has someone come in to help and usually ambulates with a RW.  Past Medical History:  Diagnosis Date  . Anemia   . Carotid stenosis   . Coronary artery disease   . Depression    bc of foot pain but no meds required  . Diabetes mellitus    takes Glipizide and Metformin daily;Lantus nightly  . Enlarged heart   . Hyperlipidemia   . Myocardial infarction (HCC) 2013  . Pneumonia 2013   hx of  . Skin spots-aging   . Weakness of right leg     Past Surgical History:  Procedure Laterality Date  . ABDOMINAL AORTAGRAM    . ABDOMINAL AORTAGRAM N/A 07/09/2012   Procedure: ABDOMINAL Ronny Flurry;  Surgeon: Sherren Kerns, MD;  Location: [facility] CATH LAB;  Service: Cardiovascular;  Laterality: N/A;  . ABDOMINAL AORTOGRAM W/LOWER EXTREMITY N/A 10/19/2017   Procedure: ABDOMINAL AORTOGRAM W/LOWER EXTREMITY;  Surgeon: Cephus Shelling, MD;  Location: [facility] INVASIVE CV LAB;  Service: Cardiovascular;  Laterality: N/A;  . AMPUTATION Right 07/14/2012   Procedure: AMPUTATION DIGIT;  Surgeon: Sherren Kerns, MD;  Location: [facility] OR;  Service: Vascular;  Laterality: Right;  Amputation Right Fourth Toe  . AMPUTATION Right 10/20/2017   Procedure: AMPUTATION DIGIT;  Surgeon: Maeola Harman, MD;  Location: [facility] OR;  Service: Vascular;  Laterality: Right;  . CARDIAC CATHETERIZATION  2013  . CAROTID ENDARTERECTOMY  07/01/11   Right CEA  . COLONOSCOPY    . CORONARY ARTERY BYPASS GRAFT  07/01/2011   Procedure: CORONARY ARTERY BYPASS GRAFTING (CABG);  Surgeon: Loreli Slot, MD;   Location: [facility] OR;  Service: Open Heart Surgery;  Laterality: N/A;  . ENDARTERECTOMY  07/01/2011   Procedure: ENDARTERECTOMY CAROTID;  Surgeon: Sherren Kerns, MD;  Location: [facility] OR;  Service: Vascular;  Laterality: Right;  . FEMORAL-POPLITEAL BYPASS GRAFT Right 07/14/2012   Procedure: BYPASS GRAFT FEMORAL-POPLITEAL ARTERY;  Surgeon: Sherren Kerns, MD;  Location: [facility] OR;  Service: Vascular;  Laterality: Right;  Right Femoral Popliteal Bypass with Left Leg Nonreversed Greater Saphenous Vein  . INTRAOPERATIVE ARTERIOGRAM Right 07/14/2012   Procedure: INTRA OPERATIVE ARTERIOGRAM;  Surgeon: Sherren Kerns, MD;  Location: [facility] OR;  Service: Vascular;  Laterality: Right;  . LEFT HEART CATHETERIZATION WITH CORONARY ANGIOGRAM N/A 04/24/2011   Procedure: LEFT HEART CATHETERIZATION WITH CORONARY ANGIOGRAM;  Surgeon: Othella Boyer, MD;  Location: [facility] CATH LAB;  Service: Cardiovascular;  Laterality: N/A;  . PERIPHERAL VASCULAR BALLOON ANGIOPLASTY Right 10/19/2017   Procedure: PERIPHERAL VASCULAR BALLOON ANGIOPLASTY;  Surgeon: Cephus Shelling, MD;  Location: [facility] INVASIVE CV LAB;  Service: Cardiovascular;  Laterality: Right;  peroneal  . PERIPHERAL VASCULAR INTERVENTION Right 10/19/2017   Procedure: PERIPHERAL VASCULAR INTERVENTION;  Surgeon: Cephus Shelling, MD;  Location: [facility] INVASIVE CV LAB;  Service: Cardiovascular;  Laterality: Right;  common iliac    Family History  Problem Relation Age of Onset  . Heart disease Father   . Cancer Brother   . Diabetes Brother   . Diabetes Daughter   .  Anesthesia problems Neg Hx   . Hypotension Neg Hx   . Malignant hyperthermia Neg Hx   . Pseudochol deficiency Neg Hx     Social History:  reports that he quit smoking about 42 years ago. His smoking use included cigarettes. He has a 20.00 pack-year smoking history. He has never used smokeless tobacco. He reports that he does not drink alcohol and does not use drugs.  Allergies: No Known Allergies  Medications:  I have reviewed the patient's current medications.  Results for orders placed or performed during the hospital encounter of 02/29/20 (from the past 48 hour(s))  CBC with Differential     Status: Abnormal   Collection Time: 02/29/20  5:20 PM  Result Value Ref Range   WBC 9.3 4.0 - 10.5 K/uL   RBC 3.57 (L) 4.22 - 5.81 MIL/uL   Hemoglobin 10.5 (L) 13.0 - 17.0 g/dL   HCT 16.1 (L) 09.6 - 04.5 %   MCV 93.3 80.0 - 100.0 fL   MCH 29.4 26.0 - 34.0 pg   MCHC 31.5 30.0 - 36.0 g/dL   RDW 40.9 81.1 - 91.4 %   Platelets 327 150 - 400 K/uL   nRBC 0.0 0.0 - 0.2 %   Neutrophils Relative % 84 %   Neutro Abs 7.8 (H) 1.7 - 7.7 K/uL   Lymphocytes Relative 9 %   Lymphs Abs 0.8 0.7 - 4.0 K/uL   Monocytes Relative 6 %   Monocytes Absolute 0.6 0.1 - 1.0 K/uL   Eosinophils Relative 1 %   Eosinophils Absolute 0.1 0.0 - 0.5 K/uL   Basophils Relative 0 %   Basophils Absolute 0.0 0.0 - 0.1 K/uL   Immature Granulocytes 0 %   Abs Immature Granulocytes 0.04 0.00 - 0.07 K/uL    Comment: Performed at [facility], [address] [place], [place] [address]  Basic metabolic panel     Status: Abnormal   Collection Time: 02/29/20  5:20 PM  Result Value Ref Range   Sodium 129 (L) 135 - 145 mmol/L   Potassium 4.2 3.5 - 5.1 mmol/L   Chloride 100 98 - 111 mmol/L   CO2 18 (L) 22 - 32 mmol/L   Glucose, Bld 163 (H) 70 - 99 mg/dL    Comment: Glucose reference range applies only to samples taken after fasting for at least 8 hours.   BUN 16 8 - 23 mg/dL   Creatinine, Ser 6.21 0.61 - 1.24 mg/dL   Calcium 9.1 8.9 - 30.8 mg/dL   GFR, Estimated >65 >78 mL/min    Comment: (NOTE) Calculated using the CKD-EPI Creatinine Equation (2021)    Anion gap 11 5 - 15    Comment: Performed at [facility], [address], [place], [place] [address]  Type and screen [facility]     Status: None   Collection Time: 02/29/20  5:32 PM  Result Value Ref Range   ABO/RH(D) A POS    Antibody Screen NEG     Sample Expiration      03/03/2020,2359 Performed at [facility], [address], [place], [place] [address]   Resp Panel by RT-PCR (Flu A&B, Covid) Nasopharyngeal Swab     Status: None   Collection Time: 02/29/20  6:24 PM   Specimen: Nasopharyngeal Swab; Nasopharyngeal(NP) swabs in vial transport medium  Result Value Ref Range   SARS Coronavirus 2 by RT PCR NEGATIVE NEGATIVE    Comment: (NOTE) SARS-CoV-2 target nucleic acids are  NOT DETECTED.  The SARS-CoV-2 RNA is generally detectable in upper respiratory specimens during the acute phase of infection. The lowest concentration of SARS-CoV-2 viral copies this assay can detect is 138 copies/mL. A negative result does not preclude SARS-Cov-2 infection and should not be used as the sole basis for treatment or other patient management decisions. A negative result may occur with  improper specimen collection/handling, submission of specimen other than nasopharyngeal swab, presence of viral mutation(s) within the areas targeted by this assay, and inadequate number of viral copies(<138 copies/mL). A negative result must be combined with clinical observations, patient history, and epidemiological information. The expected result is Negative.  Fact Sheet for Patients:  BloggerCourse.com  Fact Sheet for Healthcare Providers:  SeriousBroker.it  This test is no t yet approved or cleared by the [place] FDA and  has been authorized for detection and/or diagnosis of SARS-CoV-2 by FDA under an Emergency Use Authorization (EUA). This EUA will remain  in effect (meaning this test can be used) for the duration of the COVID-19 declaration under Section 564(b)(1) of the Act, 21 U.S.C.section 360bbb-3(b)(1), unless the authorization is terminated  or revoked sooner.       Influenza A by PCR NEGATIVE NEGATIVE   Influenza B by PCR NEGATIVE NEGATIVE    Comment: (NOTE) The Xpert Xpress  SARS-CoV-2/FLU/RSV plus assay is intended as an aid in the diagnosis of influenza from Nasopharyngeal swab specimens and should not be used as a sole basis for treatment. Nasal washings and aspirates are unacceptable for Xpert Xpress SARS-CoV-2/FLU/RSV testing.  Fact Sheet for Patients: BloggerCourse.com  Fact Sheet for Healthcare Providers: SeriousBroker.it  This test is not yet approved or cleared by the [place] FDA and has been authorized for detection and/or diagnosis of SARS-CoV-2 by FDA under an Emergency Use Authorization (EUA). This EUA will remain in effect (meaning this test can be used) for the duration of the COVID-19 declaration under Section 564(b)(1) of the Act, 21 U.S.C. section 360bbb-3(b)(1), unless the authorization is terminated or revoked.  Performed at [facility], [address], [place], [place] [address]   Surgical pcr screen     Status: None   Collection Time: 03/01/20  1:16 AM   Specimen: Nasal Mucosa; Nasal Swab  Result Value Ref Range   MRSA, PCR NEGATIVE NEGATIVE   Staphylococcus aureus NEGATIVE NEGATIVE    Comment: (NOTE) The Xpert SA Assay (FDA approved for NASAL specimens in patients 22 years of age and older), is one component of a comprehensive surveillance program. It is not intended to diagnose infection nor to guide or monitor treatment. Performed at [facility], [address] [place], [place] [address]   Glucose, capillary     Status: Abnormal   Collection Time: 03/01/20  3:05 AM  Result Value Ref Range   Glucose-Capillary 185 (H) 70 - 99 mg/dL    Comment: Glucose reference range applies only to samples taken after fasting for at least 8 hours.  Glucose, capillary     Status: Abnormal   Collection Time: 03/01/20  7:55 AM  Result Value Ref Range   Glucose-Capillary 186 (H) 70 - 99 mg/dL    Comment: Glucose reference range applies only to samples taken after  fasting for at least 8 hours.    DG Chest 1 View  Result Date: 02/29/2020 CLINICAL DATA:  Fall with right hip pain EXAM: CHEST  1 VIEW COMPARISON:  07/04/2011, 07/20/2015 FINDINGS: Post sternotomy changes. Emphysematous disease. Postsurgical clips in the  left hilar region. Linear scarring in the left mid lung. No acute airspace disease, pleural effusion, or pneumothorax. IMPRESSION: No active cardiopulmonary disease. Emphysematous disease with scarring in the left mid lung. Electronically Signed   By: Donavan Foil [name]   On: 02/29/2020 17:39   DG Knee 1-2 Views Right  Result Date: 03/01/2020 CLINICAL DATA:  Right hip fracture EXAM: RIGHT KNEE - 1-2 VIEW COMPARISON:  None. FINDINGS: Normal alignment no fracture.  Joint spaces normal. Surgical clips in the posterior soft tissues. Arterial calcification IMPRESSION: Normal knee. Atherosclerotic disease. Electronically Signed   By: Franchot Gallo [name]   On: 03/01/2020 09:01   DG Hip Unilat W or Wo Pelvis 2-3 Views Right  Result Date: 02/29/2020 CLINICAL DATA:  Pain status post fall EXAM: DG HIP (WITH OR WITHOUT PELVIS) 2-3V RIGHT COMPARISON:  None. FINDINGS: There is an acute, displaced, transcervical fracture of the proximal right femur. There is no dislocation. There are degenerative changes of both hips. There are vascular calcifications. There is osteopenia. IMPRESSION: 1. Acute, displaced, transcervical fracture of the proximal right femur. 2. Degenerative changes of both hips. 3. Osteopenia. Electronically Signed   By: Constance Holster [name]   On: 02/29/2020 17:36    Review of Systems  HENT: Negative for ear discharge, ear pain, hearing loss and tinnitus.   Eyes: Negative for photophobia and pain.  Respiratory: Negative for cough and shortness of breath.   Cardiovascular: Negative for chest pain.  Gastrointestinal: Negative for abdominal pain, nausea and vomiting.  Genitourinary: Negative for dysuria, flank pain, frequency and urgency.   Musculoskeletal: Positive for arthralgias (Right hip). Negative for back pain, myalgias and neck pain.  Neurological: Negative for dizziness and headaches.  Hematological: Does not bruise/bleed easily.  Psychiatric/Behavioral: The patient is not nervous/anxious.    Blood pressure (!) 150/64, pulse 85, temperature 98.5 F (36.9 C), temperature source Oral, resp. rate 17, SpO2 90 %. Physical Exam Constitutional:      General: He is not in acute distress.    Appearance: He is well-developed and well-nourished. He is not diaphoretic.  HENT:     Head: Normocephalic and atraumatic.  Eyes:     General: No scleral icterus.       Right eye: No discharge.        Left eye: No discharge.     Conjunctiva/sclera: Conjunctivae normal.  Cardiovascular:     Rate and Rhythm: Normal rate and regular rhythm.  Pulmonary:     Effort: Pulmonary effort is normal. No respiratory distress.  Musculoskeletal:     Cervical back: Normal range of motion.     Comments: RLE No traumatic wounds, ecchymosis, or rash  Mod TTP hip  No knee or ankle effusion  Knee stable to varus/ valgus and anterior/posterior stress  Sens DPN, SPN, TN intact  Motor EHL, ext, flex, evers 4/5, hallux surgically absent  DP 1+, PT 1+, No significant edema  Skin:    General: Skin is warm and dry.  Neurological:     Mental Status: He is alert.  Psychiatric:        Mood and Affect: Mood and affect normal.        Behavior: Behavior normal.     Assessment/Plan: Right hip fx -- Plan THA later today by Dr. Lyla Glassing. Please keep NPO. Multiple medical problems including CAD status post CABG, peripheral vascular disease status post femoropopliteal bypass history of carotid endarterectomy, anemia, and diabetes mellitus type 2 -- per primary service    Christian Mate.  Enes Rokosz, PA-C Orthopedic Surgery [phone] 03/01/2020, 9:21 AM  

## 2020-03-01 NOTE — Progress Notes (Signed)
PROGRESS NOTE    Daniel Tyler  POE:[id number] DOB: 05/05/1940 DOA: 02/29/2020 PCP: Dois Davenport, MD   Brief Narrative:  HPI: Daniel Tyler is a 80 y.o. male with history of CAD status post CABG, peripheral vascular disease status post femoropopliteal bypass history of carotid endarterectomy, anemia, diabetes mellitus type 2 had a fall at home when he tripped and fell at his door.  Denies hitting his head or losing consciousness.  Denies chest pain or shortness of breath.  ED Course: In the ER x-rays revealed right hip fracture and on-call orthopedic surgeon Dr. Susa Simmonds has been consulted plan is to do surgery.  Labs show hemoglobin of 10.5 sodium 129.  Covid test is negative.  Chest x-ray unremarkable EKG shows normal sinus rhythm with nonspecific ST changes.  Assessment & Plan:   Principal Problem:   Closed right hip fracture, initial encounter (HCC) Active Problems:   CAD (coronary artery disease)   Insulin dependent type 2 diabetes mellitus, controlled (HCC)   S/P CABG (coronary artery bypass graft)   Hypertension   COPD (chronic obstructive pulmonary disease) (HCC)   Peripheral vascular disease, unspecified (HCC)   Hip fracture (HCC)   Right hip fracture s/p mechanical fall: Orthopedics on board.  Scheduled for surgery today.  Management deferred to them and appreciate their help.  History of CAD status post CABG and history of carotid endarterectomy and PAD: We will resume antiplatelet postsurgery.  Continue beta-blocker.  COPD: Not in exacerbation.  Resume home medications.  Type 2 diabetes mellitus: Hold glipizide and Metformin.  Continue SSI.  Check hemoglobin A1c.  Hyperlipidemia: Resume Zocor.  Anemia of chronic disease: Stable.  Monitor.  Acute hyponatremia: 129.  Morning labs are still pending.  DVT prophylaxis: SCDs Start: 02/29/20 2036   Code Status: DNR  Family Communication:  None present at bedside.  Plan of care discussed with patient in length and he  verbalized understanding and agreed with it.  Status is: Inpatient  Remains inpatient appropriate because: Needs surgical repair   Dispo: The patient is from: Home              Anticipated d/c is to: SNF              Anticipated d/c date is: 3 days              Patient currently is not medically stable to d/c.        Estimated body mass index is 20.67 kg/m as calculated from the following:   Height as of 02/12/20: 5\' 9"  (1.753 m).   Weight as of 02/12/20: 63.5 kg.      Nutritional status:  Nutrition Problem: Increased nutrient needs Etiology: post-op healing   Signs/Symptoms: estimated needs   Interventions: Glucerna shake,MVI    Consultants:   Orthopedics  Procedures:   None  Antimicrobials:  Anti-infectives (From admission, onward)   Start     Dose/Rate Route Frequency Ordered Stop   03/01/20 1600  ceFAZolin (ANCEF) IVPB 2g/100 mL premix        2 g 200 mL/hr over 30 Minutes Intravenous On call to O.R. 03/01/20 04/29/20 03/02/20 0559   03/01/20 0845  ceFAZolin (ANCEF) 3 g in dextrose 5 % 50 mL IVPB  Status:  Discontinued        3 g 100 mL/hr over 30 Minutes Intravenous On call to O.R. 03/01/20 04/29/20 03/01/20 0801         Subjective: Patient seen and examined.  Complains of right  hip pain.  No other complaint.  Objective: Vitals:   02/29/20 1746 02/29/20 2116 03/01/20 0100 03/01/20 0757  BP: 124/68 (!) 160/59 (!) 171/69 (!) 150/64  Pulse: 86 75 94 85  Resp: 18 17 16 17   Temp:  97.9 F (36.6 C) 98.4 F (36.9 C) 98.5 F (36.9 C)  TempSrc:  Oral Oral Oral  SpO2: 98% 100% 90%     Intake/Output Summary (Last 24 hours) at 03/01/2020 1225 Last data filed at 03/01/2020 0900 Gross per 24 hour  Intake 0 ml  Output --  Net 0 ml   There were no vitals filed for this visit.  Examination:  General exam: Appears slightly uncomfortable due to pain. Respiratory system: Clear to auscultation. Respiratory effort normal. Cardiovascular system: S1 & S2  heard, RRR. No JVD, murmurs, rubs, gallops or clicks. No pedal edema. Gastrointestinal system: Abdomen is nondistended, soft and nontender. No organomegaly or masses felt. Normal bowel sounds heard. Central nervous system: Alert and oriented. No focal neurological deficits. Extremities: Right lower extremity externally rotated and shortened Skin: No rashes, lesions or ulcers Psychiatry: Judgement and insight appear normal. Mood & affect appropriate.    Data Reviewed: I have personally reviewed following labs and imaging studies  CBC: Recent Labs  Lab 02/29/20 1720  WBC 9.3  NEUTROABS 7.8*  HGB 10.5*  HCT 33.3*  MCV 93.3  PLT 327   Basic Metabolic Panel: Recent Labs  Lab 02/29/20 1720  NA 129*  K 4.2  CL 100  CO2 18*  GLUCOSE 163*  BUN 16  CREATININE 1.03  CALCIUM 9.1   GFR: CrCl cannot be calculated (Unknown ideal weight.). Liver Function Tests: No results for input(s): AST, ALT, ALKPHOS, BILITOT, PROT, ALBUMIN in the last 168 hours. No results for input(s): LIPASE, AMYLASE in the last 168 hours. No results for input(s): AMMONIA in the last 168 hours. Coagulation Profile: No results for input(s): INR, PROTIME in the last 168 hours. Cardiac Enzymes: No results for input(s): CKTOTAL, CKMB, CKMBINDEX, TROPONINI in the last 168 hours. BNP (last 3 results) No results for input(s): PROBNP in the last 8760 hours. HbA1C: No results for input(s): HGBA1C in the last 72 hours. CBG: Recent Labs  Lab 03/01/20 0305 03/01/20 0755 03/01/20 1214  GLUCAP 185* 186* 165*   Lipid Profile: No results for input(s): CHOL, HDL, LDLCALC, TRIG, CHOLHDL, LDLDIRECT in the last 72 hours. Thyroid Function Tests: No results for input(s): TSH, T4TOTAL, FREET4, T3FREE, THYROIDAB in the last 72 hours. Anemia Panel: No results for input(s): VITAMINB12, FOLATE, FERRITIN, TIBC, IRON, RETICCTPCT in the last 72 hours. Sepsis Labs: No results for input(s): PROCALCITON, LATICACIDVEN in the last  168 hours.  Recent Results (from the past 240 hour(s))  Resp Panel by RT-PCR (Flu A&B, Covid) Nasopharyngeal Swab     Status: None   Collection Time: 02/29/20  6:24 PM   Specimen: Nasopharyngeal Swab; Nasopharyngeal(NP) swabs in vial transport medium  Result Value Ref Range Status   SARS Coronavirus 2 by RT PCR NEGATIVE NEGATIVE Final    Comment: (NOTE) SARS-CoV-2 target nucleic acids are NOT DETECTED.  The SARS-CoV-2 RNA is generally detectable in upper respiratory specimens during the acute phase of infection. The lowest concentration of SARS-CoV-2 viral copies this assay can detect is 138 copies/mL. A negative result does not preclude SARS-Cov-2 infection and should not be used as the sole basis for treatment or other patient management decisions. A negative result may occur with  improper specimen collection/handling, submission of specimen other than nasopharyngeal  swab, presence of viral mutation(s) within the areas targeted by this assay, and inadequate number of viral copies(<138 copies/mL). A negative result must be combined with clinical observations, patient history, and epidemiological information. The expected result is Negative.  Fact Sheet for Patients:  EntrepreneurPulse.com.au  Fact Sheet for Healthcare Providers:  IncredibleEmployment.be  This test is no t yet approved or cleared by the [place] FDA and  has been authorized for detection and/or diagnosis of SARS-CoV-2 by FDA under an Emergency Use Authorization (EUA). This EUA will remain  in effect (meaning this test can be used) for the duration of the COVID-19 declaration under Section 564(b)(1) of the Act, 21 U.S.C.section 360bbb-3(b)(1), unless the authorization is terminated  or revoked sooner.       Influenza A by PCR NEGATIVE NEGATIVE Final   Influenza B by PCR NEGATIVE NEGATIVE Final    Comment: (NOTE) The Xpert Xpress SARS-CoV-2/FLU/RSV plus assay is  intended as an aid in the diagnosis of influenza from Nasopharyngeal swab specimens and should not be used as a sole basis for treatment. Nasal washings and aspirates are unacceptable for Xpert Xpress SARS-CoV-2/FLU/RSV testing.  Fact Sheet for Patients: EntrepreneurPulse.com.au  Fact Sheet for Healthcare Providers: IncredibleEmployment.be  This test is not yet approved or cleared by the [place] FDA and has been authorized for detection and/or diagnosis of SARS-CoV-2 by FDA under an Emergency Use Authorization (EUA). This EUA will remain in effect (meaning this test can be used) for the duration of the COVID-19 declaration under Section 564(b)(1) of the Act, 21 U.S.C. section 360bbb-3(b)(1), unless the authorization is terminated or revoked.  Performed at [facility], [place] [address] [place], [place] [address]   Surgical pcr screen     Status: None   Collection Time: 03/01/20  1:16 AM   Specimen: Nasal Mucosa; Nasal Swab  Result Value Ref Range Status   MRSA, PCR NEGATIVE NEGATIVE Final   Staphylococcus aureus NEGATIVE NEGATIVE Final    Comment: (NOTE) The Xpert SA Assay (FDA approved for NASAL specimens in patients 89 years of age and older), is one component of a comprehensive surveillance program. It is not intended to diagnose infection nor to guide or monitor treatment. Performed at [facility], [place] [address], [place], [place] [address]       Radiology Studies: DG Chest 1 View  Result Date: 02/29/2020 CLINICAL DATA:  Fall with right hip pain EXAM: CHEST  1 VIEW COMPARISON:  07/04/2011, 07/20/2015 FINDINGS: Post sternotomy changes. Emphysematous disease. Postsurgical clips in the left hilar region. Linear scarring in the left mid lung. No acute airspace disease, pleural effusion, or pneumothorax. IMPRESSION: No active cardiopulmonary disease. Emphysematous disease with scarring in the left mid lung. Electronically  Signed   By: Donavan Foil [name]   On: 02/29/2020 17:39   DG Knee 1-2 Views Right  Result Date: 03/01/2020 CLINICAL DATA:  Right hip fracture EXAM: RIGHT KNEE - 1-2 VIEW COMPARISON:  None. FINDINGS: Normal alignment no fracture.  Joint spaces normal. Surgical clips in the posterior soft tissues. Arterial calcification IMPRESSION: Normal knee. Atherosclerotic disease. Electronically Signed   By: Franchot Gallo [name]   On: 03/01/2020 09:01   DG Hip Unilat W or Wo Pelvis 2-3 Views Right  Result Date: 02/29/2020 CLINICAL DATA:  Pain status post fall EXAM: DG HIP (WITH OR WITHOUT PELVIS) 2-3V RIGHT COMPARISON:  None. FINDINGS: There is an acute, displaced, transcervical fracture of the proximal right femur. There is no dislocation. There are degenerative  changes of both hips. There are vascular calcifications. There is osteopenia. IMPRESSION: 1. Acute, displaced, transcervical fracture of the proximal right femur. 2. Degenerative changes of both hips. 3. Osteopenia. Electronically Signed   By: Katherine Mantle [name]   On: 02/29/2020 17:36    Scheduled Meds: . chlorhexidine  60 mL Topical Once  . [START ON 03/02/2020] feeding supplement (GLUCERNA SHAKE)  237 mL Oral TID BM  . fenofibrate  160 mg Oral Daily  . insulin aspart  0-9 Units Subcutaneous Q4H  . metoprolol tartrate  12.5 mg Oral BID  . multivitamin with minerals  1 tablet Oral Daily  . povidone-iodine  2 application Topical Once  . simvastatin  40 mg Oral QHS   Continuous Infusions: .  ceFAZolin (ANCEF) IV    . tranexamic acid       LOS: 1 day   Time spent: 35 minutes   Hughie Closs, MD [facility]  03/01/2020, 12:25 PM   To contact the attending provider between 7A-7P or the covering provider during after hours 7P-7A, please log into the web site [email or web address].

## 2020-03-01 NOTE — Interval H&P Note (Signed)
History and Physical Interval Note:  03/01/2020 2:57 PM  Daniel Tyler  has presented today for surgery, with the diagnosis of RIGHT FEMORAL NECK FRACTURE.  The various methods of treatment have been discussed with the patient and family. After consideration of risks, benefits and other options for treatment, the patient has consented to  Procedure(s): RIGHT TOTAL HIP ARTHROPLASTY ANTERIOR APPROACH (Right) as a surgical intervention.  The patient's history has been reviewed, patient examined, no change in status, stable for surgery.  I have reviewed the patient's chart and labs.  Questions were answered to the patient's satisfaction.    The risks, benefits, and alternatives were discussed with the patient. There are risks associated with the surgery including, but not limited to, problems with anesthesia (death), infection, instability (giving out of the joint), dislocation, differences in leg length/angulation/rotation, fracture of bones, loosening or failure of implants, hematoma (blood accumulation) which may require surgical drainage, blood clots, pulmonary embolism, nerve injury (foot drop and lateral thigh numbness), and blood vessel injury. The patient understands these risks and elects to proceed.    Iline Oven [place] Fr[place]

## 2020-03-01 NOTE — Anesthesia Procedure Notes (Signed)
Procedure Name: Intubation Date/Time: 03/01/2020 3:38 PM Performed by: Mayer Camel, CRNA Pre-anesthesia Checklist: Patient identified, Emergency Drugs available, Suction available and Patient being monitored Patient Re-evaluated:Patient Re-evaluated prior to induction Oxygen Delivery Method: Circle System Utilized Preoxygenation: Pre-oxygenation with 100% oxygen Induction Type: IV induction Ventilation: Mask ventilation without difficulty Laryngoscope Size: Miller and 2 Grade View: Grade I Tube type: Oral Tube size: 7.5 mm Number of attempts: 1 Airway Equipment and Method: Stylet Placement Confirmation: ETT inserted through vocal cords under direct vision,  positive ETCO2 and breath sounds checked- equal and bilateral Secured at: 23 cm Tube secured with: Tape Dental Injury: Teeth and Oropharynx as per pre-operative assessment

## 2020-03-01 NOTE — Discharge Instructions (Signed)
°Dr. Ithiel Liebler °Joint Replacement Specialist °[facility] °[address] °[place], [place] [address] °[phone] ° ° °TOTAL HIP REPLACEMENT POSTOPERATIVE DIRECTIONS ° ° ° °Hip Rehabilitation, Guidelines Following Surgery  ° °WEIGHT BEARING °Weight bearing as tolerated with assist device (walker, cane, etc) as directed, use it as long as suggested by your surgeon or therapist, typically at least 4-6 weeks. ° °The results of a hip operation are greatly improved after range of motion and muscle strengthening exercises. Follow all safety measures which are given to protect your hip. If any of these exercises cause increased pain or swelling in your joint, decrease the amount until you are comfortable again. Then slowly increase the exercises. Call your caregiver if you have problems or questions.  ° °HOME CARE INSTRUCTIONS  °Most of the following instructions are designed to prevent the dislocation of your new hip.  °Remove items at home which could result in a fall. This includes throw rugs or furniture in walking pathways.  °Continue medications as instructed at time of discharge. °· You may have some home medications which will be placed on hold until you complete the course of blood thinner medication. °· You may start showering once you are discharged home. Do not remove your dressing. °Do not put on socks or shoes without following the instructions of your caregivers.   °Sit on chairs with arms. Use the chair arms to help push yourself up when arising.  °Arrange for the use of a toilet seat elevator so you are not sitting low.  °· Walk with walker as instructed.  °You may resume a sexual relationship in one month or when given the OK by your caregiver.  °Use walker as long as suggested by your caregivers.  °You may put full weight on your legs and walk as much as is comfortable. °Avoid periods of inactivity such as sitting longer than an hour when not asleep. This helps prevent  blood clots.  °You may return to work once you are cleared by your surgeon.  °Do not drive a car for 6 weeks or until released by your surgeon.  °Do not drive while taking narcotics.  °Wear elastic stockings for two weeks following surgery during the day but you may remove then at night.  °Make sure you keep all of your appointments after your operation with all of your doctors and caregivers. You should call the office at the above phone number and make an appointment for approximately two weeks after the date of your surgery. °Please pick up a stool softener and laxative for home use as long as you are requiring pain medications. °· ICE to the affected hip every three hours for 30 minutes at a time and then as needed for pain and swelling. Continue to use ice on the hip for pain and swelling from surgery. You may notice swelling that will progress down to the foot and ankle.  This is normal after surgery.  Elevate the leg when you are not up walking on it.   °It is important for you to complete the blood thinner medication as prescribed by your doctor. °· Continue to use the breathing machine which will help keep your temperature down.  It is common for your temperature to cycle up and down following surgery, especially at night when you are not up moving around and exerting yourself.  The breathing machine keeps your lungs expanded and your temperature down. ° °RANGE OF MOTION AND STRENGTHENING EXERCISES  °These exercises are   designed to help you keep full movement of your hip joint. Follow your caregiver's or physical therapist's instructions. Perform all exercises about fifteen times, three times per day or as directed. Exercise both hips, even if you have had only one joint replacement. These exercises can be done on a training (exercise) mat, on the floor, on a table or on a bed. Use whatever works the best and is most comfortable for you. Use music or television while you are exercising so that the exercises  are a pleasant break in your day. This will make your life better with the exercises acting as a break in routine you can look forward to.  °Lying on your back, slowly slide your foot toward your buttocks, raising your knee up off the floor. Then slowly slide your foot back down until your leg is straight again.  °Lying on your back spread your legs as far apart as you can without causing discomfort.  °Lying on your side, raise your upper leg and foot straight up from the floor as far as is comfortable. Slowly lower the leg and repeat.  °Lying on your back, tighten up the muscle in the front of your thigh (quadriceps muscles). You can do this by keeping your leg straight and trying to raise your heel off the floor. This helps strengthen the largest muscle supporting your knee.  °Lying on your back, tighten up the muscles of your buttocks both with the legs straight and with the knee bent at a comfortable angle while keeping your heel on the floor.  ° °SKILLED REHAB INSTRUCTIONS: °If the patient is transferred to a skilled rehab facility following release from the hospital, a list of the current medications will be sent to the facility for the patient to continue.  When discharged from the skilled rehab facility, please have the facility set up the patient's Home Health Physical Therapy prior to being released. Also, the skilled facility will be responsible for providing the patient with their medications at time of release from the facility to include their pain medication and their blood thinner medication. If the patient is still at the rehab facility at time of the two week follow up appointment, the skilled rehab facility will also need to assist the patient in arranging follow up appointment in our office and any transportation needs. ° °MAKE SURE YOU:  °Understand these instructions.  °Will watch your condition.  °Will get help right away if you are not doing well or get worse. ° °Pick up stool softner and  laxative for home use following surgery while on pain medications. °Do not remove your dressing. °The dressing is waterproof--it is OK to take showers. °Continue to use ice for pain and swelling after surgery. °Do not use any lotions or creams on the incision until instructed by your surgeon. °Total Hip Protocol. ° ° °

## 2020-03-01 NOTE — Progress Notes (Signed)
Initial Nutrition Assessment  DOCUMENTATION CODES:   Not applicable  INTERVENTION:   -Once diet is advanced, add:  -Glucerna Shake po TID, each supplement provides 220 kcal and 10 grams of protein -MVI with minerals daily  NUTRITION DIAGNOSIS:   Increased nutrient needs related to post-op healing as evidenced by estimated needs.  GOAL:   Patient will meet greater than or equal to 90% of their needs  MONITOR:   PO intake,Supplement acceptance,Diet advancement,Labs,Skin,I & O's,Weight trends  REASON FOR ASSESSMENT:   Consult Assessment of nutrition requirement/status,Hip fracture protocol  ASSESSMENT:   Daniel Tyler is a 80 y.o. male with history of CAD status post CABG, peripheral vascular disease status post femoropopliteal bypass history of carotid endarterectomy, anemia, diabetes mellitus type 2 had a fall at home when he tripped and fell at his door.  Denies hitting his head or losing consciousness.  Denies chest pain or shortness of breath.  Pt admitted with rt hip fracture s/p mechanical fall.   Pt unavailable at time of attempted contact. Unable to obtain further nutrition-related history or complete nutrition-focused physical exam at this time.   Pt currently NPO. Per orthopedics notes, plan for THA today.   Reviewed wt hx. Noted wt gain over the past year, which is favorable given pt's previous history of malnutrition.   Pt with increased nutritional needs for post-operative healing and would benefit from addition of oral nutrition supplements   Lab Results  Component Value Date   HGBA1C 7.0 (H) 10/16/2017   PTA DM medications are 10 mg glipizide BID and 1000 mg metformin BID.   Labs reviewed: CBGS: 185-186 (inpatient orders for glycemic control are 0-9 units insulin aspart every 4 hours).   Diet Order:   Diet Order            Diet NPO time specified Except for: Sips with Meds  Diet effective now                 EDUCATION NEEDS:   No education  needs have been identified at this time  Skin:  Skin Assessment: Reviewed RN Assessment  Last BM:  02/28/20  Height:   Ht Readings from Last 1 Encounters:  02/12/20 5\' 9"  (1.753 m)    Weight:   Wt Readings from Last 1 Encounters:  02/12/20 63.5 kg    Ideal Body Weight:  72.7 kg  BMI:  There is no height or weight on file to calculate BMI.  Estimated Nutritional Needs:   Kcal:  1900-2100  Protein:  95-110 grams  Fluid:  >1.9 L    02/14/20, RD, LDN, CDCES Registered Dietitian II Certified Diabetes Care and Education Specialist Please refer to [facility] for RD and/or RD on-call/weekend/after hours pager

## 2020-03-01 NOTE — Interval H&P Note (Signed)
History and Physical Interval Note:  03/01/2020 2:56 PM  Daniel Tyler  has presented today for surgery, with the diagnosis of RIGHT FEMORAL NECK FRACTURE.  The various methods of treatment have been discussed with the patient and family. After consideration of risks, benefits and other options for treatment, the patient has consented to  Procedure(s): RIGHT TOTAL HIP ARTHROPLASTY ANTERIOR APPROACH (Right) as a surgical intervention.  The patient's history has been reviewed, patient examined, no change in status, stable for surgery.  I have reviewed the patient's chart and labs.  Questions were answered to the patient's satisfaction.    The risks, benefits, and alternatives were discussed with the patient. There are risks associated with the surgery including, but not limited to, problems with anesthesia (death), infection, instability (giving out of the joint), dislocation, differences in leg length/angulation/rotation, fracture of bones, loosening or failure of implants, hematoma (blood accumulation) which may require surgical drainage, blood clots, pulmonary embolism, nerve injury (foot drop and lateral thigh numbness), and blood vessel injury. The patient understands these risks and elects to proceed.    Iline Oven [place]

## 2020-03-01 NOTE — Transfer of Care (Signed)
Immediate Anesthesia Transfer of Care Note  Patient: Daniel Tyler  Procedure(s) Performed: RIGHT TOTAL HIP ARTHROPLASTY ANTERIOR APPROACH (Right Hip)  Patient Location: PACU  Anesthesia Type:General  Level of Consciousness: awake, alert  and oriented  Airway & Oxygen Therapy: Patient Spontanous Breathing and Patient connected to face mask oxygen  Post-op Assessment: Report given to RN and Post -op Vital signs reviewed and stable  Post vital signs: Reviewed and stable  Last Vitals:  Vitals Value Taken Time  BP 152/54 03/01/20 1720  Temp    Pulse 91 03/01/20 1735  Resp 20 03/01/20 1735  SpO2 92 % 03/01/20 1735  Vitals shown include unvalidated device data.  Last Pain:  Vitals:   03/01/20 1433  TempSrc: Oral  PainSc:       Patients Stated Pain Goal: 2 (03/01/20 1314)  Complications: No complications documented.

## 2020-03-02 ENCOUNTER — Encounter (HOSPITAL_COMMUNITY): Payer: Self-pay | Admitting: Orthopedic Surgery

## 2020-03-02 DIAGNOSIS — S72001A Fracture of unspecified part of neck of right femur, initial encounter for closed fracture: Secondary | ICD-10-CM | POA: Diagnosis not present

## 2020-03-02 LAB — BASIC METABOLIC PANEL
Anion gap: 11 (ref 5–15)
BUN: 24 mg/dL — ABNORMAL HIGH (ref 8–23)
CO2: 18 mmol/L — ABNORMAL LOW (ref 22–32)
Calcium: 8.1 mg/dL — ABNORMAL LOW (ref 8.9–10.3)
Chloride: 101 mmol/L (ref 98–111)
Creatinine, Ser: 1.75 mg/dL — ABNORMAL HIGH (ref 0.61–1.24)
GFR, Estimated: 39 mL/min — ABNORMAL LOW (ref >60)
Glucose, Bld: 244 mg/dL — ABNORMAL HIGH (ref 70–99)
Potassium: 4.7 mmol/L (ref 3.5–5.1)
Sodium: 130 mmol/L — ABNORMAL LOW (ref 135–145)

## 2020-03-02 LAB — TYPE AND SCREEN
ABO/RH(D): A POS
Antibody Screen: NEGATIVE

## 2020-03-02 LAB — CBC
HCT: 23.2 % — ABNORMAL LOW (ref 39.0–52.0)
HCT: 23.9 % — ABNORMAL LOW (ref 39.0–52.0)
Hemoglobin: 7.6 g/dL — ABNORMAL LOW (ref 13.0–17.0)
Hemoglobin: 7.9 g/dL — ABNORMAL LOW (ref 13.0–17.0)
MCH: 30 pg (ref 26.0–34.0)
MCH: 29.9 pg (ref 26.0–34.0)
MCHC: 32.8 g/dL (ref 30.0–36.0)
MCHC: 33.1 g/dL (ref 30.0–36.0)
MCV: 91.7 fL (ref 80.0–100.0)
MCV: 90.5 fL (ref 80.0–100.0)
Platelets: 314 10*3/uL (ref 150–400)
Platelets: 273 10*3/uL (ref 150–400)
RBC: 2.53 MIL/uL — ABNORMAL LOW (ref 4.22–5.81)
RBC: 2.64 MIL/uL — ABNORMAL LOW (ref 4.22–5.81)
RDW: 13.9 % (ref 11.5–15.5)
RDW: 13.6 % (ref 11.5–15.5)
WBC: 11.8 10*3/uL — ABNORMAL HIGH (ref 4.0–10.5)
WBC: 9.1 10*3/uL (ref 4.0–10.5)
nRBC: 0 % (ref 0.0–0.2)
nRBC: 0 % (ref 0.0–0.2)

## 2020-03-02 LAB — GLUCOSE, CAPILLARY
Glucose-Capillary: 183 mg/dL — ABNORMAL HIGH (ref 70–99)
Glucose-Capillary: 209 mg/dL — ABNORMAL HIGH (ref 70–99)
Glucose-Capillary: 210 mg/dL — ABNORMAL HIGH (ref 70–99)
Glucose-Capillary: 215 mg/dL — ABNORMAL HIGH (ref 70–99)
Glucose-Capillary: 219 mg/dL — ABNORMAL HIGH (ref 70–99)
Glucose-Capillary: 219 mg/dL — ABNORMAL HIGH (ref 70–99)
Glucose-Capillary: 249 mg/dL — ABNORMAL HIGH (ref 70–99)

## 2020-03-02 LAB — PREPARE RBC (CROSSMATCH)

## 2020-03-02 MED ORDER — DOCUSATE SODIUM 100 MG PO CAPS
100.0000 mg | ORAL_CAPSULE | Freq: Two times a day (BID) | ORAL | Status: DC
  Administered 2020-03-02 – 2020-03-07 (×12): 100 mg via ORAL
  Filled 2020-03-02 (×12): qty 1

## 2020-03-02 MED ORDER — PHENOL 1.4 % MT LIQD: 1.0000 | OROMUCOSAL | Status: DC | PRN

## 2020-03-02 MED ORDER — ACETAMINOPHEN 325 MG PO TABS
325.0000 mg | ORAL_TABLET | Freq: Four times a day (QID) | ORAL | Status: DC | PRN
  Administered 2020-03-06: 650 mg via ORAL
  Filled 2020-03-02: qty 2

## 2020-03-02 MED ORDER — MORPHINE SULFATE (PF) 2 MG/ML IV SOLN
0.5000 mg | INTRAVENOUS | Status: DC | PRN
  Administered 2020-03-04 (×2): 1 mg via INTRAVENOUS
  Filled 2020-03-02 (×2): qty 1

## 2020-03-02 MED ORDER — METOCLOPRAMIDE HCL 5 MG/ML IJ SOLN: 5.0000 mg | Freq: Three times a day (TID) | INTRAMUSCULAR | Status: DC | PRN

## 2020-03-02 MED ORDER — CEFAZOLIN SODIUM-DEXTROSE 2-4 GM/100ML-% IV SOLN
2.0000 g | Freq: Four times a day (QID) | INTRAVENOUS | Status: AC
  Administered 2020-03-02 (×2): 2 g via INTRAVENOUS
  Filled 2020-03-02 (×2): qty 100

## 2020-03-02 MED ORDER — INSULIN GLARGINE 100 UNIT/ML ~~LOC~~ SOLN
10.0000 [IU] | Freq: Every day | SUBCUTANEOUS | Status: DC
  Administered 2020-03-02 – 2020-03-05 (×4): 10 [IU] via SUBCUTANEOUS
  Filled 2020-03-02 (×4): qty 0.1

## 2020-03-02 MED ORDER — MENTHOL 3 MG MT LOZG: 1.0000 | LOZENGE | OROMUCOSAL | Status: DC | PRN

## 2020-03-02 MED ORDER — HYDROCODONE-ACETAMINOPHEN 7.5-325 MG PO TABS
1.0000 | ORAL_TABLET | ORAL | Status: DC | PRN
  Administered 2020-03-02 – 2020-03-07 (×6): 1 via ORAL
  Filled 2020-03-02 (×6): qty 1

## 2020-03-02 MED ORDER — ONDANSETRON HCL 4 MG PO TABS: 4.0000 mg | ORAL_TABLET | Freq: Four times a day (QID) | ORAL | Status: DC | PRN

## 2020-03-02 MED ORDER — SIMVASTATIN 20 MG PO TABS
20.0000 mg | ORAL_TABLET | Freq: Every day | ORAL | Status: DC
  Administered 2020-03-02 – 2020-03-07 (×6): 20 mg via ORAL
  Filled 2020-03-02 (×6): qty 1

## 2020-03-02 MED ORDER — HYDROCODONE-ACETAMINOPHEN 5-325 MG PO TABS
1.0000 | ORAL_TABLET | ORAL | Status: DC | PRN
  Administered 2020-03-02 (×2): 2 via ORAL
  Administered 2020-03-05 – 2020-03-07 (×3): 1 via ORAL
  Filled 2020-03-02 (×2): qty 2
  Filled 2020-03-02 (×2): qty 1
  Filled 2020-03-02: qty 2

## 2020-03-02 MED ORDER — ASPIRIN 81 MG PO CHEW: 81.0000 mg | CHEWABLE_TABLET | Freq: Two times a day (BID) | ORAL | 0 refills | Status: AC

## 2020-03-02 MED ORDER — CALCIUM CARBONATE ANTACID 500 MG PO CHEW
1.0000 | CHEWABLE_TABLET | Freq: Three times a day (TID) | ORAL | Status: DC
  Administered 2020-03-02 – 2020-03-03 (×2): 200 mg via ORAL
  Filled 2020-03-02 (×2): qty 1

## 2020-03-02 MED ORDER — METOCLOPRAMIDE HCL 5 MG PO TABS: 5.0000 mg | ORAL_TABLET | Freq: Three times a day (TID) | ORAL | Status: DC | PRN

## 2020-03-02 MED ORDER — HYDROCODONE-ACETAMINOPHEN 7.5-325 MG PO TABS: 1.0000 | ORAL_TABLET | Freq: Four times a day (QID) | ORAL | 0 refills | Status: DC | PRN

## 2020-03-02 MED ORDER — ONDANSETRON HCL 4 MG/2ML IJ SOLN
4.0000 mg | Freq: Four times a day (QID) | INTRAMUSCULAR | Status: DC | PRN
  Administered 2020-03-03: 4 mg via INTRAVENOUS
  Filled 2020-03-02: qty 2

## 2020-03-02 MED ORDER — SODIUM CHLORIDE 0.9% IV SOLUTION: Freq: Once | INTRAVENOUS | Status: AC

## 2020-03-02 MED ORDER — ASPIRIN 81 MG PO CHEW
81.0000 mg | CHEWABLE_TABLET | Freq: Two times a day (BID) | ORAL | Status: DC
  Administered 2020-03-02 – 2020-03-07 (×12): 81 mg via ORAL
  Filled 2020-03-02 (×12): qty 1

## 2020-03-02 NOTE — Evaluation (Signed)
Physical Therapy Evaluation Patient Details Name: Daniel Tyler MRN: [id number] DOB: 25-Jul-1940 Today's Date: 03/02/2020   History of Present Illness  Daniel Tyler is a 80 y.o. male with a past medical history significant for anemia, CAD, depression, diabetes, hyperlipidemia, history of MI who presented to the ED after mechanical fall. Patient sustained R femoral neck fx. Patient s/p R THA, anterior approach on 1/6.  Clinical Impression  PTA, patient lives alone and ambulates with rollator at baseline, patient reports neighbor comes to assist with iADLs. Patient presents with deficits in strength, endurance, balance, and functional mobility. Patient required modA+2 for bed mobility with step by step cues for sequencing. Upon sitting EOB, patient had complaints of severe dizziness and noted decreased responsiveness, returned to supine with totalA+2 (see precautions for orthostatic vitals). Patient will require +2 for OOB mobility. Patient will benefit from skilled PT services during acute stay to address listed deficits. Recommend SNF following discharge to maximize functional independence and return to PLOF due to decreased caregiver at home at discharge.     Follow Up Recommendations SNF;Supervision/Assistance - 24 hour    Equipment Recommendations  Rolling Anaijah Augsburger with 5" wheels;3in1 (PT)    Recommendations for Other Services       Precautions / Restrictions Precautions Precautions: Fall Precaution Comments: watch BPs (decreased level of consciousness upon sitting up with BP initially 85/53 HR 88; post 2 minutes supine 143/58 HR 93) on eval Restrictions Weight Bearing Restrictions: Yes RLE Weight Bearing: Weight bearing as tolerated      Mobility  Bed Mobility Overal bed mobility: Needs Assistance Bed Mobility: Supine to Sit;Sit to Supine     Supine to sit: Mod assist;+2 for physical assistance;HOB elevated Sit to supine: Total assist;+2 for physical assistance;+2 for  safety/equipment   General bed mobility comments: Assist with legs and trunk, step by step vc for sequencing. TotalA+2 for return to bed due to decreased responsiveness, dizziness, and orthostasis    Transfers                 General transfer comment: unable to do due to increased dizziness and decreased repsonsiveness when seated at EOB  Ambulation/Gait                Stairs            Wheelchair Mobility    Modified Rankin (Stroke Patients Only)       Balance Overall balance assessment: Needs assistance Sitting-balance support: Bilateral upper extremity supported;Feet supported Sitting balance-Leahy Scale: Poor Sitting balance - Comments: posterior lean       Standing balance comment: unable due to decreased level of responsiveness upon sitting up on EOB                             Pertinent Vitals/Pain Pain Assessment: Faces Faces Pain Scale: Hurts little more Pain Location: right hip Pain Descriptors / Indicators: Aching;Sore Pain Intervention(s): Limited activity within patient's tolerance;Monitored during session;Repositioned;Patient requesting pain meds-RN notified    Home Living Family/patient expects to be discharged to:: Skilled nursing facility Living Arrangements: Alone               Additional Comments: Neighbor does alot of his IADLs for him and does grocery shopping.    Prior Function Level of Independence: Independent with assistive device(s)         Comments: rollator     Hand Dominance   Dominant Hand: Right  Extremity/Trunk Assessment   Upper Extremity Assessment Upper Extremity Assessment: Defer to OT evaluation    Lower Extremity Assessment Lower Extremity Assessment: RLE deficits/detail RLE Deficits / Details: patient able to advance to R LE to EOB with minA RLE: Unable to fully assess due to pain (unable to fully assess due to orthostasis and symptomatic)       Communication    Communication: No difficulties  Cognition Arousal/Alertness: Awake/alert Behavior During Therapy: WFL for tasks assessed/performed Overall Cognitive Status: Within Functional Limits for tasks assessed                                        General Comments      Exercises     Assessment/Plan    PT Assessment Patient needs continued PT services  PT Problem List Decreased strength;Decreased range of motion;Decreased activity tolerance;Decreased balance;Decreased mobility       PT Treatment Interventions DME instruction;Gait training;Stair training;Therapeutic activities;Functional mobility training;Therapeutic exercise;Balance training;Patient/family education    PT Goals (Current goals can be found in the Care Plan section)  Acute Rehab PT Goals Patient Stated Goal: to get something for heart burn PT Goal Formulation: With patient Time For Goal Achievement: 03/16/20 Potential to Achieve Goals: Fair    Frequency Min 3X/week   Barriers to discharge        Co-evaluation PT/OT/SLP Co-Evaluation/Treatment: Yes Reason for Co-Treatment: For patient/therapist safety PT goals addressed during session: Mobility/safety with mobility;Balance OT goals addressed during session: Strengthening/ROM       AM-PAC PT "6 Clicks" Mobility  Outcome Measure Help needed turning from your back to your side while in a flat bed without using bedrails?: A Lot Help needed moving from lying on your back to sitting on the side of a flat bed without using bedrails?: A Lot Help needed moving to and from a bed to a chair (including a wheelchair)?: Total Help needed standing up from a chair using your arms (e.g., wheelchair or bedside chair)?: Total Help needed to walk in hospital room?: Total Help needed climbing 3-5 steps with a railing? : Total 6 Click Score: 8    End of Session Equipment Utilized During Treatment: Oxygen Activity Tolerance: Treatment limited secondary to medical  complications (Comment) (orthostasis and decreased responsiveness) Patient left: in bed;with call bell/phone within reach;with bed alarm set Nurse Communication: Mobility status;Patient requests pain meds;Other (comment) (orthostatic vitals) PT Visit Diagnosis: Unsteadiness on feet (R26.81);Other abnormalities of gait and mobility (R26.89);Muscle weakness (generalized) (M62.81);History of falling (Z91.81);Difficulty in walking, not elsewhere classified (R26.2)    Time: 2725-3664 PT Time Calculation (min) (ACUTE ONLY): 35 min   Charges:   PT Evaluation $PT Eval Moderate Complexity: 1 Mod          Zaidyn Claire A. Dan Humphreys PT, DPT Acute Rehabilitation Services Pager [phone] Office [phone]   Zannie Kehr Allred 03/02/2020, 10:10 AM

## 2020-03-02 NOTE — Plan of Care (Signed)

## 2020-03-02 NOTE — Progress Notes (Addendum)
    Subjective:  Patient reports pain as mild to moderate.  Denies N/V/CP/SOB.   Objective:   VITALS:   Vitals:   03/01/20 1452 03/02/20 0030 03/02/20 0431 03/02/20 0757  BP:  93/63 93/62 (!) 112/54  Pulse:  82 79 88  Resp:  18 19 17   Temp:  97.9 F (36.6 C) 98.1 F (36.7 C) 97.7 F (36.5 C)  TempSrc:  Oral Oral Oral  SpO2:  95% 94% 93%  Weight: 63.5 kg     Height: 5\' 9"  (1.753 m)       NAD ABD soft Neurovascular intact Sensation intact distally Intact pulses distally Dorsiflexion/Plantar flexion intact Incision: dressing C/D/I   Lab Results  Component Value Date   WBC 11.8 (H) 03/02/2020   HGB 7.6 (L) 03/02/2020   HCT 23.2 (L) 03/02/2020   MCV 91.7 03/02/2020   PLT 314 03/02/2020   BMET    Component Value Date/Time   NA 130 (L) 03/02/2020 0212   K 4.7 03/02/2020 0212   CL 101 03/02/2020 0212   CO2 18 (L) 03/02/2020 0212   GLUCOSE 244 (H) 03/02/2020 0212   BUN 24 (H) 03/02/2020 0212   CREATININE 1.75 (H) 03/02/2020 0212   CREATININE 0.66 06/07/2011 1645   CALCIUM 8.1 (L) 03/02/2020 0212   GFRNONAA 39 (L) 03/02/2020 0212   GFRAA >60 10/23/2017 0314     Assessment/Plan: 1 Day Post-Op   Principal Problem:   Closed right hip fracture, initial encounter (HCC) Active Problems:   CAD (coronary artery disease)   Insulin dependent type 2 diabetes mellitus, controlled (HCC)   S/P CABG (coronary artery bypass graft)   Hypertension   COPD (chronic obstructive pulmonary disease) (HCC)   Peripheral vascular disease, unspecified (HCC)   Hip fracture (HCC)   WBAT with walker DVT ppx: Aspirin and Plavix, SCDs, TEDS PO pain control PT/OT ABLA: HgB 7.6 this AM. Treat per hospitalist recommendations Dispo: D/C once cleared by PT/OT     04/30/2020 03/02/2020, 8:42 AM  [facility] is now 04/30/2020 [address], [place], [address] [place] Phone: [phone] [email or web address] Facebook   [phone]

## 2020-03-02 NOTE — Anesthesia Postprocedure Evaluation (Signed)
Anesthesia Post Note  Patient: Jered Heiny Anacker  Procedure(s) Performed: RIGHT TOTAL HIP ARTHROPLASTY ANTERIOR APPROACH (Right Hip)     Patient location during evaluation: PACU Anesthesia Type: General Level of consciousness: awake and alert Pain management: pain level controlled Vital Signs Assessment: post-procedure vital signs reviewed and stable Respiratory status: spontaneous breathing, nonlabored ventilation, respiratory function stable and patient connected to nasal cannula oxygen Cardiovascular status: blood pressure returned to baseline and stable Postop Assessment: no apparent nausea or vomiting Anesthetic complications: no   No complications documented.  Last Vitals:  Vitals:   03/02/20 0030 03/02/20 0431  BP: 93/63 93/62  Pulse: 82 79  Resp: 18 19  Temp: 36.6 C 36.7 C  SpO2: 95% 94%    Last Pain:  Vitals:   03/02/20 0431  TempSrc: Oral  PainSc:    Pain Goal: Patients Stated Pain Goal: 3 (03/02/20 0130)                 Corran Lalone L Milliani Herrada

## 2020-03-02 NOTE — Progress Notes (Signed)
PROGRESS NOTE    Daniel Tyler  DXA:[id number] DOB: Jul 20, 1940 DOA: 02/29/2020 PCP: Dois Davenport, MD   Brief Narrative:  HPI: Daniel Tyler is a 80 y.o. male with history of CAD status post CABG, peripheral vascular disease status post femoropopliteal bypass history of carotid endarterectomy, anemia, diabetes mellitus type 2 had a fall at home when he tripped and fell at his door.  Denies hitting his head or losing consciousness.  Denies chest pain or shortness of breath.  ED Course: In the ER x-rays revealed right hip fracture and on-call orthopedic surgeon Dr. Susa Simmonds has been consulted plan is to do surgery.  Labs show hemoglobin of 10.5 sodium 129.  Covid test is negative.  Chest x-ray unremarkable EKG shows normal sinus rhythm with nonspecific ST changes.  Assessment & Plan:   Principal Problem:   Closed right hip fracture, initial encounter (HCC) Active Problems:   CAD (coronary artery disease)   Insulin dependent type 2 diabetes mellitus, controlled (HCC)   S/P CABG (coronary artery bypass graft)   Hypertension   COPD (chronic obstructive pulmonary disease) (HCC)   Peripheral vascular disease, unspecified (HCC)   Hip fracture (HCC)   Right hip fracture s/p mechanical fall: Orthopedics on board.  Postoperative day 1 status post right total hip arthroplasty.  Pain controlled.  Management per Ortho.    History of CAD status post CABG and history of carotid endarterectomy and PAD: Continue Plavix and beta-blocker.  COPD: Not in exacerbation.  Resume home medications.  Type 2 diabetes mellitus: Hold glipizide and Metformin.  Blood sugar elevated.  Start on Lantus 10 units and continue SSI.  Hyperlipidemia: Continue Zocor.  Anemia of chronic disease with acute blood loss/postoperative anemia: Hemoglobin dropped to 7.6.  Significant drop from yesterday preoperatively.  Will recheck hemoglobin at noon and then at 5.  Transfuse if less than 7.  Orthostatic hypotension: Patient  developed orthostatic hypotension and decreased responsiveness this morning when he was working with PT.  His blood pressure was low, systolics in 80s but he quickly improved after he was laid back in the bed.  This could very well be due to anemia.  Please see above for details.  Chronic hyponatremia: At baseline for the patient.  DVT prophylaxis: SCDs Start: 03/02/20 0118, I have sent a message to orthopedic PA to address chemical thromboprophylaxis per their practice guidelines.   Code Status: DNR  Family Communication:  None present at bedside.  Plan of care discussed with patient in length and he verbalized understanding and agreed with it.  Status is: Inpatient  Remains inpatient appropriate because: Needs surgical repair   Dispo: The patient is from: Home              Anticipated d/c is to: SNF              Anticipated d/c date is: 1 to 2 days              Patient currently is not medically stable to d/c.        Estimated body mass index is 20.67 kg/m as calculated from the following:   Height as of this encounter: 5\' 9"  (1.753 m).   Weight as of this encounter: 63.5 kg.      Nutritional status:  Nutrition Problem: Increased nutrient needs Etiology: post-op healing   Signs/Symptoms: estimated needs   Interventions: Glucerna shake,MVI    Consultants:   Orthopedics  Procedures:   Right hip arthroplasty  Antimicrobials:  Anti-infectives (  From admission, onward)   Start     Dose/Rate Route Frequency Ordered Stop   03/02/20 0200  ceFAZolin (ANCEF) IVPB 2g/100 mL premix        2 g 200 mL/hr over 30 Minutes Intravenous Every 6 hours 03/02/20 0118 03/02/20 0930   03/01/20 1600  ceFAZolin (ANCEF) IVPB 2g/100 mL premix        2 g 200 mL/hr over 30 Minutes Intravenous On call to O.R. 03/01/20 0758 03/01/20 1612   03/01/20 0845  ceFAZolin (ANCEF) 3 g in dextrose 5 % 50 mL IVPB  Status:  Discontinued        3 g 100 mL/hr over 30 Minutes Intravenous On call to  O.R. 03/01/20 3474 03/01/20 0801         Subjective: Seen and examined.  Eating breakfast.  Feeling well.  No pain.  Objective: Vitals:   03/01/20 1452 03/02/20 0030 03/02/20 0431 03/02/20 0757  BP:  93/63 93/62 (!) 112/54  Pulse:  82 79 88  Resp:  18 19 17   Temp:  97.9 F (36.6 C) 98.1 F (36.7 C) 97.7 F (36.5 C)  TempSrc:  Oral Oral Oral  SpO2:  95% 94% 93%  Weight: 63.5 kg     Height: 5\' 9"  (1.753 m)       Intake/Output Summary (Last 24 hours) at 03/02/2020 1112 Last data filed at 03/02/2020 0358 Gross per 24 hour  Intake 1133.86 ml  Output 1050 ml  Net 83.86 ml   Filed Weights   03/01/20 1452  Weight: 63.5 kg    Examination:  General exam: Appears calm and comfortable  Respiratory system: Clear to auscultation. Respiratory effort normal. Cardiovascular system: S1 & S2 heard, RRR. No JVD, murmurs, rubs, gallops or clicks. No pedal edema. Gastrointestinal system: Abdomen is nondistended, soft and nontender. No organomegaly or masses felt. Normal bowel sounds heard. Central nervous system: Alert and oriented. No focal neurological deficits. Skin: No rashes, lesions or ulcers.  Psychiatry: Judgement and insight appear normal. Mood & affect appropriate.   Data Reviewed: I have personally reviewed following labs and imaging studies  CBC: Recent Labs  Lab 02/29/20 1720 03/01/20 1907 03/02/20 0212  WBC 9.3 17.4* 11.8*  NEUTROABS 7.8* 15.4*  --   HGB 10.5* 9.0* 7.6*  HCT 33.3* 28.8* 23.2*  MCV 93.3 94.4 91.7  PLT 327 388 259   Basic Metabolic Panel: Recent Labs  Lab 02/29/20 1720 03/01/20 1907 03/02/20 0212  NA 129* 129* 130*  K 4.2 4.5 4.7  CL 100 100 101  CO2 18* 17* 18*  GLUCOSE 163* 219* 244*  BUN 16 17 24*  CREATININE 1.03 1.11 1.75*  CALCIUM 9.1 8.5* 8.1*   GFR: Estimated Creatinine Clearance: 30.7 mL/min (A) (by C-G formula based on SCr of 1.75 mg/dL (H)). Liver Function Tests: No results for input(s): AST, ALT, ALKPHOS, BILITOT, PROT,  ALBUMIN in the last 168 hours. No results for input(s): LIPASE, AMYLASE in the last 168 hours. No results for input(s): AMMONIA in the last 168 hours. Coagulation Profile: No results for input(s): INR, PROTIME in the last 168 hours. Cardiac Enzymes: No results for input(s): CKTOTAL, CKMB, CKMBINDEX, TROPONINI in the last 168 hours. BNP (last 3 results) No results for input(s): PROBNP in the last 8760 hours. HbA1C: Recent Labs    02/29/20 1720  HGBA1C 6.7*   CBG: Recent Labs  Lab 03/01/20 1744 03/01/20 2100 03/02/20 0139 03/02/20 0406 03/02/20 0903  GLUCAP 187* 229* 210* 209* 183*   Lipid Profile:  No results for input(s): CHOL, HDL, LDLCALC, TRIG, CHOLHDL, LDLDIRECT in the last 72 hours. Thyroid Function Tests: No results for input(s): TSH, T4TOTAL, FREET4, T3FREE, THYROIDAB in the last 72 hours. Anemia Panel: No results for input(s): VITAMINB12, FOLATE, FERRITIN, TIBC, IRON, RETICCTPCT in the last 72 hours. Sepsis Labs: No results for input(s): PROCALCITON, LATICACIDVEN in the last 168 hours.  Recent Results (from the past 240 hour(s))  Resp Panel by RT-PCR (Flu A&B, Covid) Nasopharyngeal Swab     Status: None   Collection Time: 02/29/20  6:24 PM   Specimen: Nasopharyngeal Swab; Nasopharyngeal(NP) swabs in vial transport medium  Result Value Ref Range Status   SARS Coronavirus 2 by RT PCR NEGATIVE NEGATIVE Final    Comment: (NOTE) SARS-CoV-2 target nucleic acids are NOT DETECTED.  The SARS-CoV-2 RNA is generally detectable in upper respiratory specimens during the acute phase of infection. The lowest concentration of SARS-CoV-2 viral copies this assay can detect is 138 copies/mL. A negative result does not preclude SARS-Cov-2 infection and should not be used as the sole basis for treatment or other patient management decisions. A negative result may occur with  improper specimen collection/handling, submission of specimen other than nasopharyngeal swab, presence of  viral mutation(s) within the areas targeted by this assay, and inadequate number of viral copies(<138 copies/mL). A negative result must be combined with clinical observations, patient history, and epidemiological information. The expected result is Negative.  Fact Sheet for Patients:  BloggerCourse.com  Fact Sheet for Healthcare Providers:  SeriousBroker.it  This test is no t yet approved or cleared by the [place] FDA and  has been authorized for detection and/or diagnosis of SARS-CoV-2 by FDA under an Emergency Use Authorization (EUA). This EUA will remain  in effect (meaning this test can be used) for the duration of the COVID-19 declaration under Section 564(b)(1) of the Act, 21 U.S.C.section 360bbb-3(b)(1), unless the authorization is terminated  or revoked sooner.       Influenza A by PCR NEGATIVE NEGATIVE Final   Influenza B by PCR NEGATIVE NEGATIVE Final    Comment: (NOTE) The Xpert Xpress SARS-CoV-2/FLU/RSV plus assay is intended as an aid in the diagnosis of influenza from Nasopharyngeal swab specimens and should not be used as a sole basis for treatment. Nasal washings and aspirates are unacceptable for Xpert Xpress SARS-CoV-2/FLU/RSV testing.  Fact Sheet for Patients: BloggerCourse.com  Fact Sheet for Healthcare Providers: SeriousBroker.it  This test is not yet approved or cleared by the [place] FDA and has been authorized for detection and/or diagnosis of SARS-CoV-2 by FDA under an Emergency Use Authorization (EUA). This EUA will remain in effect (meaning this test can be used) for the duration of the COVID-19 declaration under Section 564(b)(1) of the Act, 21 U.S.C. section 360bbb-3(b)(1), unless the authorization is terminated or revoked.  Performed at [facility], [address] [place], [place] [address]   Surgical pcr screen      Status: None   Collection Time: 03/01/20  1:16 AM   Specimen: Nasal Mucosa; Nasal Swab  Result Value Ref Range Status   MRSA, PCR NEGATIVE NEGATIVE Final   Staphylococcus aureus NEGATIVE NEGATIVE Final    Comment: (NOTE) The Xpert SA Assay (FDA approved for NASAL specimens in patients 91 years of age and older), is one component of a comprehensive surveillance program. It is not intended to diagnose infection nor to guide or monitor treatment. Performed at [facility], [address] [place], [place] [address]  Radiology Studies: DG Chest 1 View  Result Date: 02/29/2020 CLINICAL DATA:  Fall with right hip pain EXAM: CHEST  1 VIEW COMPARISON:  07/04/2011, 07/20/2015 FINDINGS: Post sternotomy changes. Emphysematous disease. Postsurgical clips in the left hilar region. Linear scarring in the left mid lung. No acute airspace disease, pleural effusion, or pneumothorax. IMPRESSION: No active cardiopulmonary disease. Emphysematous disease with scarring in the left mid lung. Electronically Signed   By: Jasmine Pang [name]   On: 02/29/2020 17:39   DG Knee 1-2 Views Right  Result Date: 03/01/2020 CLINICAL DATA:  Right hip fracture EXAM: RIGHT KNEE - 1-2 VIEW COMPARISON:  None. FINDINGS: Normal alignment no fracture.  Joint spaces normal. Surgical clips in the posterior soft tissues. Arterial calcification IMPRESSION: Normal knee. Atherosclerotic disease. Electronically Signed   By: Marlan Palau [name]   On: 03/01/2020 09:01   DG Pelvis Portable  Result Date: 03/01/2020 CLINICAL DATA:  80 year old male status post right hip arthroplasty. EXAM: PORTABLE PELVIS 1-2 VIEWS COMPARISON:  Pelvic radiograph dated 02/29/2020. FINDINGS: Total right hip arthroplasty. The arthroplasty components appear intact and in anatomic alignment. There is no acute fracture or dislocation. The bones are osteopenic. Vascular calcifications noted. Cutaneous clips over the right hip. IMPRESSION: Total right hip  arthroplasty. Electronically Signed   By: Elgie Collard [name]   On: 03/01/2020 19:53   DG C-Arm 1-60 Min  Result Date: 03/01/2020 CLINICAL DATA:  Right femoral neck fracture EXAM: OPERATIVE RIGHT HIP WITH PELVIS; DG C-ARM 1-60 MIN COMPARISON:  Plain film from previous day. FLUOROSCOPY TIME:  Radiation Exposure Index (as provided by the fluoroscopic device): 0.62 mGy If the device does not provide the exposure index: Fluoroscopy Time:  6 seconds Number of Acquired Images:  6 FINDINGS: Initial images demonstrate removal of the femoral neck and femoral head. Acetabular component is noted in place. Subsequent sizing device was placed. Femoral component was then placed for completion and well seated within the acetabular component. IMPRESSION: Status post right hip replacement. Electronically Signed   By: Alcide Clever [name]   On: 03/01/2020 17:03   DG HIP OPERATIVE UNILAT W OR W/O PELVIS RIGHT  Result Date: 03/01/2020 CLINICAL DATA:  Right femoral neck fracture EXAM: OPERATIVE RIGHT HIP WITH PELVIS; DG C-ARM 1-60 MIN COMPARISON:  Plain film from previous day. FLUOROSCOPY TIME:  Radiation Exposure Index (as provided by the fluoroscopic device): 0.62 mGy If the device does not provide the exposure index: Fluoroscopy Time:  6 seconds Number of Acquired Images:  6 FINDINGS: Initial images demonstrate removal of the femoral neck and femoral head. Acetabular component is noted in place. Subsequent sizing device was placed. Femoral component was then placed for completion and well seated within the acetabular component. IMPRESSION: Status post right hip replacement. Electronically Signed   By: Alcide Clever [name]   On: 03/01/2020 17:03   DG Hip Unilat W or Wo Pelvis 2-3 Views Right  Result Date: 02/29/2020 CLINICAL DATA:  Pain status post fall EXAM: DG HIP (WITH OR WITHOUT PELVIS) 2-3V RIGHT COMPARISON:  None. FINDINGS: There is an acute, displaced, transcervical fracture of the proximal right femur. There is no  dislocation. There are degenerative changes of both hips. There are vascular calcifications. There is osteopenia. IMPRESSION: 1. Acute, displaced, transcervical fracture of the proximal right femur. 2. Degenerative changes of both hips. 3. Osteopenia. Electronically Signed   By: Katherine Mantle [name]   On: 02/29/2020 17:36    Scheduled Meds: . aspirin  81 mg Oral BID WC  .  clopidogrel  75 mg Oral Daily  . docusate sodium  100 mg Oral BID  . feeding supplement (GLUCERNA SHAKE)  237 mL Oral TID BM  . fenofibrate  160 mg Oral Daily  . insulin aspart  0-9 Units Subcutaneous Q4H  . metoprolol tartrate  12.5 mg Oral BID  . multivitamin with minerals  1 tablet Oral Daily  . simvastatin  20 mg Oral QHS   Continuous Infusions:    LOS: 2 days   Time spent: 30 minutes   Hughie Closs, MD [facility]  03/02/2020, 11:12 AM   To contact the attending provider between 7A-7P or the covering provider during after hours 7P-7A, please log into the web site [email or web address].

## 2020-03-02 NOTE — NC FL2 (Signed)
MEDICAID FL2 LEVEL OF CARE SCREENING TOOL     IDENTIFICATION  Patient Name: Daniel Tyler Birthdate: Jan 19, 1941 Sex: male Admission Date (Current Location): 02/29/2020  [facility] and [place] Number:  Producer, television/film/video and Address:  [facility] [place], [address], [place], [place] [address]      Provider Number: [id number]  Attending Physician Name and Address:  Hughie Closs, MD  Relative Name and Phone Number:  [name] friend, [phone]    Current Level of Care: Hospital Recommended Level of Care: Skilled Nursing Facility Prior Approval Number:    Date Approved/Denied:   PASRR Number: [id number]  Discharge Plan: SNF    Current Diagnoses: Patient Active Problem List   Diagnosis Date Noted  . Hip fracture (HCC) 02/29/2020  . Closed right hip fracture, initial encounter (HCC) 02/29/2020  . Anxiety 12/08/2017  . Hyperlipidemia 12/08/2017  . Malnutrition of moderate degree 10/18/2017  . Gangrene of toe of right foot (HCC) 10/16/2017  . Cellulitis of great toe of right foot 10/16/2017  . Osteomyelitis of great toe of right foot (HCC) 10/16/2017  . Uncontrolled type 2 diabetes mellitus with peripheral neuropathy (HCC) 06/10/2016  . Skin lesion 11/30/2015  . Anemia 09/15/2015  . Right foot ulcer (HCC) 09/15/2015  . Renal insufficiency 09/15/2015  . Syncope 07/20/2015  . Aftercare following surgery of the circulatory system, NEC 09/09/2012  . Carotid artery stenosis 09/09/2012  . Peripheral vascular disease, unspecified (HCC) 07/29/2012  . Atherosclerosis of native arteries of the extremities with ulceration (HCC) 07/08/2012  . Type II or unspecified type diabetes mellitus with peripheral circulatory disorders, uncontrolled(250.72) 07/08/2012  . Diabetic foot ulcer (HCC) 06/26/2012  . Occlusion and stenosis of carotid artery without mention of cerebral infarction 07/24/2011  . Hypertension   . COPD (chronic obstructive pulmonary  disease) (HCC)   . Anemia due to blood loss, acute   . S/P CABG (coronary artery bypass graft) 07/01/2011  . Carotid artery disease (HCC) 07/01/2011  . Disorder of arteries and arterioles (HCC) 07/01/2011  . Depression 06/07/2011  . Dyslipidemia   . CAD (coronary artery disease) 04/24/2011  . Insulin dependent type 2 diabetes mellitus, controlled (HCC)     Orientation RESPIRATION BLADDER Height & Weight     Self,Time,Situation,Place  Normal Continent Weight: 140 lb (63.5 kg) Height:  5\' 9"  (175.3 cm)  BEHAVIORAL SYMPTOMS/MOOD NEUROLOGICAL BOWEL NUTRITION STATUS      Continent Diet (Please see DC Summary)  AMBULATORY STATUS COMMUNICATION OF NEEDS Skin   Extensive Assist Verbally Surgical wounds (Closed incision on hip)                       Personal Care Assistance Level of Assistance  Bathing,Feeding,Dressing Bathing Assistance: Maximum assistance Feeding assistance: Independent Dressing Assistance: Limited assistance     Functional Limitations Info             SPECIAL CARE FACTORS FREQUENCY  PT (By licensed PT),OT (By licensed OT)     PT Frequency: 5x/week OT Frequency: 5x/week            Contractures Contractures Info: Not present    Additional Factors Info  Code Status,Allergies,Insulin Sliding Scale Code Status Info: DNR Allergies Info: NKA   Insulin Sliding Scale Info: See DC Summary       Current Medications (03/02/2020):  This is the current hospital active medication list Current Facility-Administered Medications  Medication Dose Route Frequency Provider Last Rate Last Admin  . acetaminophen (TYLENOL)  tablet 325-650 mg  325-650 mg Oral Q6H PRN Swinteck, Arlys Gentry, MD      . aspirin chewable tablet 81 mg  81 mg Oral BID WC Samson Frederic, MD   81 mg at 03/02/20 0849  . clopidogrel (PLAVIX) tablet 75 mg  75 mg Oral Daily Mosetta Anis, RPH   75 mg at 03/02/20 1048  . docusate sodium (COLACE) capsule 100 mg  100 mg Oral BID Samson Frederic, MD    100 mg at 03/02/20 1048  . feeding supplement (GLUCERNA SHAKE) (GLUCERNA SHAKE) liquid 237 mL  237 mL Oral TID BM Samson Frederic, MD   237 mL at 03/02/20 1002  . fenofibrate tablet 160 mg  160 mg Oral Daily Samson Frederic, MD   160 mg at 03/02/20 1100  . HYDROcodone-acetaminophen (NORCO) 7.5-325 MG per tablet 1-2 tablet  1-2 tablet Oral Q4H PRN Swinteck, Arlys Gaje, MD      . HYDROcodone-acetaminophen (NORCO/VICODIN) 5-325 MG per tablet 1-2 tablet  1-2 tablet Oral Q4H PRN Samson Frederic, MD   2 tablet at 03/02/20 1102  . insulin aspart (novoLOG) injection 0-9 Units  0-9 Units Subcutaneous Q4H Samson Frederic, MD   3 Units at 03/02/20 1206  . insulin glargine (LANTUS) injection 10 Units  10 Units Subcutaneous Daily Pahwani, Daleen Bo, MD      . menthol-cetylpyridinium (CEPACOL) lozenge 3 mg  1 lozenge Oral PRN Swinteck, Arlys Starlin, MD       Or  . phenol (CHLORASEPTIC) mouth spray 1 spray  1 spray Mouth/Throat PRN Swinteck, Arlys Donoven, MD      . metoCLOPramide (REGLAN) tablet 5-10 mg  5-10 mg Oral Q8H PRN Swinteck, Arlys Quan, MD       Or  . metoCLOPramide (REGLAN) injection 5-10 mg  5-10 mg Intravenous Q8H PRN Swinteck, Arlys Verdis, MD      . metoprolol tartrate (LOPRESSOR) tablet 12.5 mg  12.5 mg Oral BID Samson Frederic, MD   12.5 mg at 03/02/20 1047  . morphine 2 MG/ML injection 0.5-1 mg  0.5-1 mg Intravenous Q2H PRN Swinteck, Arlys Ladarion, MD      . multivitamin with minerals tablet 1 tablet  1 tablet Oral Daily Samson Frederic, MD   1 tablet at 03/02/20 1048  . ondansetron (ZOFRAN) tablet 4 mg  4 mg Oral Q6H PRN Swinteck, Arlys Seng, MD       Or  . ondansetron (ZOFRAN) injection 4 mg  4 mg Intravenous Q6H PRN Swinteck, Arlys Tabb, MD      . simvastatin (ZOCOR) tablet 20 mg  20 mg Oral QHS Hughie Closs, MD         Discharge Medications: Please see discharge summary for a list of discharge medications.  Relevant Imaging Results:  Relevant Lab Results:   Additional Information SSN: [id number]. Unvaccinated  Mearl Latin,  LCSW

## 2020-03-02 NOTE — TOC CAGE-AID Note (Signed)
Transition of Care [facility]) - CAGE-AID Screening   Patient Details  Name: Daniel Tyler MRN: [id number] Date of Birth: June 21, 1940   Clinical Narrative:  Pt reports he lives alone, but has assistance from neighbors to aid in daily chores around his house. States he heavily drank and smoked in years past, but quit 15 years ago. Reports no current drug or alcohol abuse.   CAGE-AID Screening:    Have You Ever Felt You Ought to Cut Down on Your Drinking or Drug Use?: Yes Have People Annoyed You By Critizing Your Drinking Or Drug Use?: Yes Have You Felt Bad Or Guilty About Your Drinking Or Drug Use?: Yes Have You Ever Had a Drink or Used Drugs First Thing In The Morning to Steady Your Nerves or to Get Rid of a Hangover?: Yes CAGE-AID Score: 4  Substance Abuse Education Offered: No (pt quit 15 years ago)

## 2020-03-02 NOTE — Evaluation (Signed)
Occupational Therapy Evaluation Patient Details Name: Daniel Tyler MRN: [id number] DOB: 20-Jun-1940 Today's Date: 03/02/2020    History of Present Illness Pt admitted post fall with resultant right hip fx, now s/p right total hip arthroplasty, anterior approach.   Clinical Impression   This 80 yo male admitted and underwent above presents to acute OT with PLOF of being able to do his own basic ADLs at rollator level and on 2 liters of O2. Currently pt was orthostatic with sitting EOB, needed Mod A +2 to get to EOB and with decreased sitting balance at EOB. He will continue to benefit from acute OT with follow up at SNF to get back to PLOF. Let MD(Pahwani) know through secure chat of pt's decreased responsiveness when we sat him EOB as well as Charity fundraiser.    Follow Up Recommendations  SNF;Supervision/Assistance - 24 hour    Equipment Recommendations  Other (comment) (TBD next venue)       Precautions / Restrictions Precautions Precautions: Fall Precaution Comments: watch BPs (decreased level of consciousness upon sitting up with BP initially 85/53 HR 88; post 2 minutes supine 143/58 HR 93) on eval Restrictions Weight Bearing Restrictions: No RLE Weight Bearing: Weight bearing as tolerated      Mobility Bed Mobility Overal bed mobility: Needs Assistance Bed Mobility: Supine to Sit     Supine to sit: Mod assist;+2 for physical assistance;HOB elevated     General bed mobility comments: A for legs and trunk as well as step by step vc's for technique    Transfers                 General transfer comment: unable to do due to increased dizziness and decreased repsonsiveness when seated at EOB    Balance Overall balance assessment: Needs assistance Sitting-balance support: Bilateral upper extremity supported;Feet supported Sitting balance-Leahy Scale: Poor Sitting balance - Comments: posterior lean       Standing balance comment: unable due to decreased level of  responsiveness upon sitting up on EOB                           ADL either performed or assessed with clinical judgement   ADL Overall ADL's : Needs assistance/impaired Eating/Feeding: Independent;Bed level   Grooming: Set up;Bed level   Upper Body Bathing: Set up;Bed level   Lower Body Bathing: Total assistance;Bed level   Upper Body Dressing : Moderate assistance;Bed level   Lower Body Dressing: Total assistance;Bed level                       Vision Patient Visual Report: No change from baseline              Pertinent Vitals/Pain Pain Assessment: Faces Faces Pain Scale: Hurts little more Pain Location: right hip Pain Descriptors / Indicators: Aching;Sore Pain Intervention(s): Limited activity within patient's tolerance;Monitored during session;Repositioned;Patient requesting pain meds-RN notified     Hand Dominance Right   Extremity/Trunk Assessment Upper Extremity Assessment Upper Extremity Assessment: Overall WFL for tasks assessed           Communication Communication Communication: No difficulties   Cognition Arousal/Alertness: Awake/alert Behavior During Therapy: WFL for tasks assessed/performed Overall Cognitive Status: Within Functional Limits for tasks assessed  Home Living Family/patient expects to be discharged to:: Skilled nursing facility Living Arrangements: Alone                               Additional Comments: Neighbor does alot of his IADLs for him and does grocery shopping.      Prior Functioning/Environment Level of Independence: Independent with assistive device(s)        Comments: rollator        OT Problem List: Decreased strength;Decreased range of motion;Decreased activity tolerance;Impaired balance (sitting and/or standing);Pain      OT Treatment/Interventions: Self-care/ADL training;DME and/or AE  instruction;Patient/family education;Balance training    OT Goals(Current goals can be found in the care plan section) Acute Rehab OT Goals Patient Stated Goal: to get something for heart burn OT Goal Formulation: With patient Time For Goal Achievement: 03/16/20 Potential to Achieve Goals: Good  OT Frequency: Min 2X/week   Barriers to D/C: Decreased caregiver support          Co-evaluation PT/OT/SLP Co-Evaluation/Treatment: Yes Reason for Co-Treatment: For patient/therapist safety PT goals addressed during session: Mobility/safety with mobility;Balance;Strengthening/ROM OT goals addressed during session: Strengthening/ROM      AM-PAC OT "6 Clicks" Daily Activity     Outcome Measure Help from another person eating meals?: None Help from another person taking care of personal grooming?: A Little Help from another person toileting, which includes using toliet, bedpan, or urinal?: Total Help from another person bathing (including washing, rinsing, drying)?: A Lot Help from another person to put on and taking off regular upper body clothing?: A Lot Help from another person to put on and taking off regular lower body clothing?: Total 6 Click Score: 13   End of Session Nurse Communication:  (decreased responsiveness upon sitting EOB with low BP upon returning to supine and increase in BP with increased time (2 minutes))  Activity Tolerance: Other (comment) (limited by decreased responsiveness upon sitting up on EOB) Patient left: in bed;with call bell/phone within reach;with bed alarm set  OT Visit Diagnosis: Unsteadiness on feet (R26.81);Muscle weakness (generalized) (M62.81);Other abnormalities of gait and mobility (R26.89);Pain Pain - Right/Left: Right Pain - part of body: Hip                Time: 9892-1194 OT Time Calculation (min): 35 min Charges:  OT General Charges $OT Visit: 1 Visit OT Evaluation $OT Eval Moderate Complexity: 1 Mod  Daniel Tyler, OTR/L Acute [employer] Pager [phone] Office [phone]     Daniel Tyler 03/02/2020, 9:56 AM

## 2020-03-02 NOTE — Progress Notes (Addendum)
   03/01/20 2127  Assess: MEWS Score  Temp 97.9 F (36.6 C)  BP (!) 76/49  Pulse Rate 79  Resp 19  Level of Consciousness Alert  SpO2 92 %  O2 Device Nasal Cannula  O2 Flow Rate (L/min) 2 L/min  Assess: MEWS Score  MEWS Temp 0  MEWS Systolic 2  MEWS Pulse 0  MEWS RR 0  MEWS LOC 0  MEWS Score 2  MEWS Score Color Yellow  Assess: if the MEWS score is Yellow or Red  Were vital signs taken at a resting state? Yes  Focused Assessment No change from prior assessment  Early Detection of Sepsis Score *See Row Information* Low  MEWS guidelines implemented *See Row Information* Yes  Treat  MEWS Interventions Administered scheduled meds/treatments  Pain Scale 0-10  Pain Score 3  Pain Type Surgical pain  Pain Location Hip  Pain Orientation Right  Pain Descriptors / Indicators Aching  Pain Frequency Intermittent  Pain Onset With Activity  Patients Stated Pain Goal 3  Pain Intervention(s) Medication (See eMAR)  Take Vital Signs  Increase Vital Sign Frequency  Yellow: Q 2hr X 2 then Q 4hr X 2, if remains yellow, continue Q 4hrs  Escalate  MEWS: Escalate Yellow: discuss with charge nurse/RN and consider discussing with provider and RRT  Notify: Charge Nurse/RN  Name of Charge Nurse/RN Notified Francesca Jewett  Date Charge Nurse/RN Notified 03/01/20  Time Charge Nurse/RN Notified 2130  Notify: Provider  Provider Name/Title [facility]  Date Provider Notified 03/01/20  Time Provider Notified 2130  Notification Type Page  Notification Reason Change in status (VS change)  Response See new orders  Date of Provider Response 03/01/20  Time of Provider Response 2130  Document  Patient Outcome Stabilized after interventions  Progress note created (see row info) Yes   Soft BP, Dr. Bruna Potter notified, New order for NS bolus completed, patient BP stabilized after intervention, will continue to monitor.

## 2020-03-02 NOTE — TOC Initial Note (Signed)
Transition of Care [facility]) - Initial/Assessment Note    Patient Details  Name: Daniel Tyler MRN: [id number] Date of Birth: [birth year]  [facility]) CM/SW Contact:    Daniel Latin, LCSW Phone Number: 03/02/2020, 4:32 PM  Clinical Narrative:                 CSW received consult for possible SNF placement at time of discharge. CSW spoke with patient. He perseverated on having heartburn (NT alerting RN). Patient reported that he lives alone and provides permission to speak with his POA, Daniel Tyler if needed. He stated that he wanted to return home at discharge. CSW pointed out that he has not been able to walk independently and that PT was recommending SNF. He stated he would not go to a nursing home. CSW explained that rehab is in the same building as a nursing home but that it is a different part and only for a short amount of time. He then stated that he would consider rehab in that case. CSW discussed insurance authorization process and provided Medicare SNF ratings list. Patient has not received the COVID vaccines and refuses to get them. CSW sent out referrals for review and will follow up with bed offers.   Expected Discharge Plan: Skilled Nursing Facility Barriers to Discharge: Continued Medical Work up,Insurance Authorization,No SNF bed   Patient Goals and CMS Choice Patient states their goals for this hospitalization and ongoing recovery are:: Return home CMS Medicare.gov Compare Post Acute Care list provided to:: Patient Choice offered to / list presented to : Patient  Expected Discharge Plan and Services Expected Discharge Plan: Skilled Nursing Facility In-house Referral: Clinical Social Work   Post Acute Care Choice: Skilled Nursing Facility Living arrangements for the past 2 months: Single Family Home                                      Prior Living Arrangements/Services Living arrangements for the past 2 months: Single Family Home Lives with::  Self Patient language and need for interpreter reviewed:: Yes        Need for Family Participation in Patient Care: Yes (Comment) Care giver support system in place?: Yes (comment)   Criminal Activity/Legal Involvement Pertinent to Current Situation/Hospitalization: No - Comment as needed  Activities of Daily Living Home Assistive Devices/Equipment: Daniel Tyler (specify type) ADL Screening (condition at time of admission) Patient's cognitive ability adequate to safely complete daily activities?: Yes Is the patient deaf or have difficulty hearing?: No Does the patient have difficulty seeing, even when wearing glasses/contacts?: No Does the patient have difficulty concentrating, remembering, or making decisions?: No Patient able to express need for assistance with ADLs?: Yes Does the patient have difficulty dressing or bathing?: No Independently performs ADLs?: Yes (appropriate for developmental age) Does the patient have difficulty walking or climbing stairs?: Yes Weakness of Legs: Both Weakness of Arms/Hands: Both  Permission Sought/Granted Permission sought to share information with : Facility Contact Representative,Family Supports Permission granted to share information with : Yes, Verbal Permission Granted  Share Information with NAME: Daniel Tyler  Permission granted to share info w AGENCY: SNFs  Permission granted to share info w Relationship: POA  Permission granted to share info w Contact Information: [phone]  Emotional Assessment Appearance:: Appears stated age Attitude/Demeanor/Rapport: Guarded,Complaining Affect (typically observed): Accepting,Appropriate,Guarded,Irritable Orientation: : Oriented to Self,Oriented to Place,Oriented to Situation,Oriented to  Time Alcohol / Substance Use: Not  Applicable Psych Involvement: No (comment)  Admission diagnosis:  Hip fracture (HCC) [S72.009A] Preop examination [Z01.818] Closed right hip fracture, initial encounter [facility])  [S72.001A] Patient Active Problem List   Diagnosis Date Noted  . Hip fracture (HCC) 02/29/2020  . Closed right hip fracture, initial encounter (HCC) 02/29/2020  . Anxiety 12/08/2017  . Hyperlipidemia 12/08/2017  . Malnutrition of moderate degree 10/18/2017  . Gangrene of toe of right foot (HCC) 10/16/2017  . Cellulitis of great toe of right foot 10/16/2017  . Osteomyelitis of great toe of right foot (HCC) 10/16/2017  . Uncontrolled type 2 diabetes mellitus with peripheral neuropathy (HCC) 06/10/2016  . Skin lesion 11/30/2015  . Anemia 09/15/2015  . Right foot ulcer (HCC) 09/15/2015  . Renal insufficiency 09/15/2015  . Syncope 07/20/2015  . Aftercare following surgery of the circulatory system, NEC 09/09/2012  . Carotid artery stenosis 09/09/2012  . Peripheral vascular disease, unspecified (HCC) 07/29/2012  . Atherosclerosis of native arteries of the extremities with ulceration (HCC) 07/08/2012  . Type II or unspecified type diabetes mellitus with peripheral circulatory disorders, uncontrolled(250.72) 07/08/2012  . Diabetic foot ulcer (HCC) 06/26/2012  . Occlusion and stenosis of carotid artery without mention of cerebral infarction 07/24/2011  . Hypertension   . COPD (chronic obstructive pulmonary disease) (HCC)   . Anemia due to blood loss, acute   . S/P CABG (coronary artery bypass graft) 07/01/2011  . Carotid artery disease (HCC) 07/01/2011  . Disorder of arteries and arterioles (HCC) 07/01/2011  . Depression 06/07/2011  . Dyslipidemia   . CAD (coronary artery disease) 04/24/2011  . Insulin dependent type 2 diabetes mellitus, controlled (HCC)    PCP:  Daniel Davenport, MD Pharmacy:   [facility] [address] ([place]), [place] [address] [place] ([place]) [place] [address] Phone: [phone] Fax: [phone]  [facility] [address] [place], [place] - [address] [place] [address] Phone: [phone] Fax:  [phone]     Social Determinants of Health (SDOH) Interventions    Readmission Risk Interventions No flowsheet data found.

## 2020-03-03 DIAGNOSIS — S72001A Fracture of unspecified part of neck of right femur, initial encounter for closed fracture: Secondary | ICD-10-CM | POA: Diagnosis not present

## 2020-03-03 LAB — BASIC METABOLIC PANEL
Anion gap: 8 (ref 5–15)
BUN: 41 mg/dL — ABNORMAL HIGH (ref 8–23)
CO2: 21 mmol/L — ABNORMAL LOW (ref 22–32)
Calcium: 8.2 mg/dL — ABNORMAL LOW (ref 8.9–10.3)
Chloride: 99 mmol/L (ref 98–111)
Creatinine, Ser: 2.09 mg/dL — ABNORMAL HIGH (ref 0.61–1.24)
GFR, Estimated: 32 mL/min — ABNORMAL LOW (ref >60)
Glucose, Bld: 163 mg/dL — ABNORMAL HIGH (ref 70–99)
Potassium: 4.6 mmol/L (ref 3.5–5.1)
Sodium: 128 mmol/L — ABNORMAL LOW (ref 135–145)

## 2020-03-03 LAB — TYPE AND SCREEN
ABO/RH(D): A POS
Antibody Screen: NEGATIVE
Unit division: 0

## 2020-03-03 LAB — GLUCOSE, CAPILLARY
Glucose-Capillary: 150 mg/dL — ABNORMAL HIGH (ref 70–99)
Glucose-Capillary: 169 mg/dL — ABNORMAL HIGH (ref 70–99)
Glucose-Capillary: 175 mg/dL — ABNORMAL HIGH (ref 70–99)
Glucose-Capillary: 188 mg/dL — ABNORMAL HIGH (ref 70–99)
Glucose-Capillary: 188 mg/dL — ABNORMAL HIGH (ref 70–99)
Glucose-Capillary: 193 mg/dL — ABNORMAL HIGH (ref 70–99)

## 2020-03-03 LAB — CBC
HCT: 23.1 % — ABNORMAL LOW (ref 39.0–52.0)
Hemoglobin: 7.7 g/dL — ABNORMAL LOW (ref 13.0–17.0)
MCH: 30.1 pg (ref 26.0–34.0)
MCHC: 33.3 g/dL (ref 30.0–36.0)
MCV: 90.2 fL (ref 80.0–100.0)
Platelets: 290 10*3/uL (ref 150–400)
RBC: 2.56 MIL/uL — ABNORMAL LOW (ref 4.22–5.81)
RDW: 14 % (ref 11.5–15.5)
WBC: 8.1 10*3/uL (ref 4.0–10.5)
nRBC: 0 % (ref 0.0–0.2)

## 2020-03-03 LAB — BPAM RBC
Blood Product Expiration Date: 202201272359
ISSUE DATE / TIME: 202201071705
Unit Type and Rh: 6200

## 2020-03-03 LAB — HEMOGLOBIN AND HEMATOCRIT, BLOOD
HCT: 20.2 % — ABNORMAL LOW (ref 39.0–52.0)
Hemoglobin: 6.6 g/dL — CL (ref 13.0–17.0)

## 2020-03-03 MED ORDER — ONDANSETRON HCL 4 MG PO TABS: 4.0000 mg | ORAL_TABLET | Freq: Four times a day (QID) | ORAL | Status: DC | PRN

## 2020-03-03 MED ORDER — ONDANSETRON HCL 4 MG/2ML IJ SOLN: 4.0000 mg | Freq: Four times a day (QID) | INTRAMUSCULAR | Status: DC | PRN

## 2020-03-03 MED ORDER — SODIUM CHLORIDE 0.9 % IV SOLN: INTRAVENOUS | Status: AC

## 2020-03-03 MED ORDER — ONDANSETRON HCL 4 MG/2ML IJ SOLN
4.0000 mg | Freq: Four times a day (QID) | INTRAMUSCULAR | Status: DC | PRN
  Administered 2020-03-04 – 2020-03-06 (×2): 4 mg via INTRAVENOUS
  Filled 2020-03-03 (×2): qty 2

## 2020-03-03 MED ORDER — PANTOPRAZOLE SODIUM 40 MG IV SOLR
40.0000 mg | Freq: Every day | INTRAVENOUS | Status: DC
  Administered 2020-03-03 – 2020-03-04 (×2): 40 mg via INTRAVENOUS
  Filled 2020-03-03 (×2): qty 40

## 2020-03-03 MED ORDER — CALCIUM CARBONATE ANTACID 500 MG PO CHEW
1.0000 | CHEWABLE_TABLET | Freq: Two times a day (BID) | ORAL | Status: DC
  Administered 2020-03-03 – 2020-03-07 (×9): 200 mg via ORAL
  Filled 2020-03-03 (×9): qty 1

## 2020-03-03 NOTE — Progress Notes (Signed)
Notified Dr Jacqulyn Bath that pt is extremely nauseous and having heartburn. PRN zofran given. HOB up 90 degrees. RN will continue to monitor.

## 2020-03-03 NOTE — Progress Notes (Addendum)
PROGRESS NOTE    Daniel Tyler  XVQ:[id number] DOB: 1940-12-15 DOA: 02/29/2020 PCP: Dois Davenport, MD   Brief Narrative:  Daniel Tyler is a 80 y.o. male with history of CAD status post CABG, peripheral vascular disease status post femoropopliteal bypass history of carotid endarterectomy, anemia, diabetes mellitus type 2 had a fall at home when he tripped and fell at his door.  Denied hitting his head or losing consciousness.  In the ER x-rays revealed right hip fracture.  He was admitted under hospital service.  Orthopedic consulted.  Underwent right total hip arthroplasty on 03/01/2020.  Did well postoperatively except acute blood loss anemia with hemoglobin dropped to less than 7 for which she received 1 unit of PRBC transfusion on 03/02/2020.  Hemoglobin remained stable after that.   Assessment & Plan:   Principal Problem:   Closed right hip fracture, initial encounter (HCC) Active Problems:   CAD (coronary artery disease)   Insulin dependent type 2 diabetes mellitus, controlled (HCC)   S/P CABG (coronary artery bypass graft)   Hypertension   COPD (chronic obstructive pulmonary disease) (HCC)   Peripheral vascular disease, unspecified (HCC)   Hip fracture (HCC)   Right hip fracture s/p mechanical fall: Orthopedics on board.  Postoperative day 2 status post right total hip arthroplasty.  Pain controlled.  Management per Ortho.    AKI: Patient developed AKI postoperatively.  Creatinine slightly worse today.  No output recorded.  Will order strict I's and O's.  Start on gentle IV hydration with normal saline at 75 cc/h for 12 hours.  Pete labs in the morning.  History of CAD status post CABG and history of carotid endarterectomy and PAD: Continue aspirin, Plavix and beta-blocker.  COPD: Not in exacerbation.  Resume home medications.  Type 2 diabetes mellitus: Hold glipizide and Metformin.  Blood sugar controlled.  Continue Lantus 10 units and SSI.  Hyperlipidemia: Continue  Zocor.  Anemia of chronic disease with acute blood loss/postoperative anemia: Baseline hemoglobin between 9 and 10.  Presented with hemoglobin of 10.5 at the time of admission which dropped to 6.6 postoperatively.  Received 1 unit of PRBC transfusion on 03/02/2020.  Hemoglobin is stable over 7 since then.  Monitor daily.  Orthostatic hypotension: Patient developed orthostatic hypotension and decreased responsiveness this morning when he was working with PT.  His blood pressure was low, systolics in 80s but he quickly improved after he was laid back in the bed.  This could very well be due to anemia.  Please see above for details.  No further episodes.  Chronic hyponatremia: At baseline for the patient.  128 today.  DVT prophylaxis: SCDs Start: 03/02/20 0118, I have sent a message to orthopedic PA to address chemical thromboprophylaxis per their practice guidelines.   Code Status: DNR  Family Communication:  None present at bedside.  Plan of care discussed with patient in length and he verbalized understanding and agreed with it.  Status is: Inpatient  Remains inpatient appropriate because: Needs surgical repair   Dispo: The patient is from: Home              Anticipated d/c is to: SNF              Anticipated d/c date is: 1 to 2 days              Patient currently is not medically stable to d/c.        Estimated body mass index is 20.67 kg/m as calculated from  the following:   Height as of this encounter: 5\' 9"  (1.753 m).   Weight as of this encounter: 63.5 kg.      Nutritional status:  Nutrition Problem: Increased nutrient needs Etiology: post-op healing   Signs/Symptoms: estimated needs   Interventions: Glucerna shake,MVI    Consultants:   Orthopedics  Procedures:   Right hip arthroplasty  Antimicrobials:  Anti-infectives (From admission, onward)   Start     Dose/Rate Route Frequency Ordered Stop   03/02/20 0200  ceFAZolin (ANCEF) IVPB 2g/100 mL premix         2 g 200 mL/hr over 30 Minutes Intravenous Every 6 hours 03/02/20 0118 03/02/20 0930   03/01/20 1600  ceFAZolin (ANCEF) IVPB 2g/100 mL premix        2 g 200 mL/hr over 30 Minutes Intravenous On call to O.R. 03/01/20 0758 03/01/20 1612   03/01/20 0845  ceFAZolin (ANCEF) 3 g in dextrose 5 % 50 mL IVPB  Status:  Discontinued        3 g 100 mL/hr over 30 Minutes Intravenous On call to O.R. 03/01/20 04/29/20 03/01/20 0801         Subjective: Patient seen and examined.  No complaints.  Pain controlled.  Objective: Vitals:   03/02/20 1936 03/02/20 2031 03/03/20 0438 03/03/20 0822  BP: 132/62 (!) 148/56 (!) 114/55 (!) 108/52  Pulse: 89 89 73 68  Resp: 20 20 18 17   Temp: 97.7 F (36.5 C) 97.9 F (36.6 C) 98.4 F (36.9 C) 97.9 F (36.6 C)  TempSrc: Oral Oral Oral Oral  SpO2:  91% 94% 90%  Weight:      Height:       No intake or output data in the 24 hours ending 03/03/20 1106 Filed Weights   03/01/20 1452  Weight: 63.5 kg    Examination:  General exam: Appears calm and comfortable  Respiratory system: Clear to auscultation. Respiratory effort normal. Cardiovascular system: S1 & S2 heard, RRR. No JVD, murmurs, rubs, gallops or clicks. No pedal edema. Gastrointestinal system: Abdomen is nondistended, soft and nontender. No organomegaly or masses felt. Normal bowel sounds heard. Central nervous system: Alert and oriented. No focal neurological deficits. Skin: No rashes, lesions or ulcers.  Surgical wound looks clean and intact with no signs of bruising or bleeding. Psychiatry: Judgement and insight appear normal. Mood & affect appropriate.   Data Reviewed: I have personally reviewed following labs and imaging studies  CBC: Recent Labs  Lab 02/29/20 1720 03/01/20 1907 03/02/20 0212 03/02/20 1233 03/02/20 2159 03/03/20 0448  WBC 9.3 17.4* 11.8*  --  9.1 8.1  NEUTROABS 7.8* 15.4*  --   --   --   --   HGB 10.5* 9.0* 7.6* 6.6* 7.9* 7.7*  HCT 33.3* 28.8* 23.2* 20.2* 23.9*  23.1*  MCV 93.3 94.4 91.7  --  90.5 90.2  PLT 327 388 314  --  273 290   Basic Metabolic Panel: Recent Labs  Lab 02/29/20 1720 03/01/20 1907 03/02/20 0212 03/03/20 0448  NA 129* 129* 130* 128*  K 4.2 4.5 4.7 4.6  CL 100 100 101 99  CO2 18* 17* 18* 21*  GLUCOSE 163* 219* 244* 163*  BUN 16 17 24* 41*  CREATININE 1.03 1.11 1.75* 2.09*  CALCIUM 9.1 8.5* 8.1* 8.2*   GFR: Estimated Creatinine Clearance: 25.7 mL/min (A) (by C-G formula based on SCr of 2.09 mg/dL (H)). Liver Function Tests: No results for input(s): AST, ALT, ALKPHOS, BILITOT, PROT, ALBUMIN in the last 168  hours. No results for input(s): LIPASE, AMYLASE in the last 168 hours. No results for input(s): AMMONIA in the last 168 hours. Coagulation Profile: No results for input(s): INR, PROTIME in the last 168 hours. Cardiac Enzymes: No results for input(s): CKTOTAL, CKMB, CKMBINDEX, TROPONINI in the last 168 hours. BNP (last 3 results) No results for input(s): PROBNP in the last 8760 hours. HbA1C: Recent Labs    02/29/20 1720  HGBA1C 6.7*   CBG: Recent Labs  Lab 03/02/20 1604 03/02/20 2029 03/02/20 2336 03/03/20 0442 03/03/20 0828  GLUCAP 219* 215* 219* 150* 169*   Lipid Profile: No results for input(s): CHOL, HDL, LDLCALC, TRIG, CHOLHDL, LDLDIRECT in the last 72 hours. Thyroid Function Tests: No results for input(s): TSH, T4TOTAL, FREET4, T3FREE, THYROIDAB in the last 72 hours. Anemia Panel: No results for input(s): VITAMINB12, FOLATE, FERRITIN, TIBC, IRON, RETICCTPCT in the last 72 hours. Sepsis Labs: No results for input(s): PROCALCITON, LATICACIDVEN in the last 168 hours.  Recent Results (from the past 240 hour(s))  Resp Panel by RT-PCR (Flu A&B, Covid) Nasopharyngeal Swab     Status: None   Collection Time: 02/29/20  6:24 PM   Specimen: Nasopharyngeal Swab; Nasopharyngeal(NP) swabs in vial transport medium  Result Value Ref Range Status   SARS Coronavirus 2 by RT PCR NEGATIVE NEGATIVE Final     Comment: (NOTE) SARS-CoV-2 target nucleic acids are NOT DETECTED.  The SARS-CoV-2 RNA is generally detectable in upper respiratory specimens during the acute phase of infection. The lowest concentration of SARS-CoV-2 viral copies this assay can detect is 138 copies/mL. A negative result does not preclude SARS-Cov-2 infection and should not be used as the sole basis for treatment or other patient management decisions. A negative result may occur with  improper specimen collection/handling, submission of specimen other than nasopharyngeal swab, presence of viral mutation(s) within the areas targeted by this assay, and inadequate number of viral copies(<138 copies/mL). A negative result must be combined with clinical observations, patient history, and epidemiological information. The expected result is Negative.  Fact Sheet for Patients:  BloggerCourse.comhttps://www.fda.gov/media/152166/download  Fact Sheet for Healthcare Providers:  SeriousBroker.ithttps://www.fda.gov/media/152162/download  This test is no t yet approved or cleared by the [place] FDA and  has been authorized for detection and/or diagnosis of SARS-CoV-2 by FDA under an Emergency Use Authorization (EUA). This EUA will remain  in effect (meaning this test can be used) for the duration of the COVID-19 declaration under Section 564(b)(1) of the Act, 21 U.S.C.section 360bbb-3(b)(1), unless the authorization is terminated  or revoked sooner.       Influenza A by PCR NEGATIVE NEGATIVE Final   Influenza B by PCR NEGATIVE NEGATIVE Final    Comment: (NOTE) The Xpert Xpress SARS-CoV-2/FLU/RSV plus assay is intended as an aid in the diagnosis of influenza from Nasopharyngeal swab specimens and should not be used as a sole basis for treatment. Nasal washings and aspirates are unacceptable for Xpert Xpress SARS-CoV-2/FLU/RSV testing.  Fact Sheet for Patients: BloggerCourse.comhttps://www.fda.gov/media/152166/download  Fact Sheet for Healthcare  Providers: SeriousBroker.ithttps://www.fda.gov/media/152162/download  This test is not yet approved or cleared by the [place] FDA and has been authorized for detection and/or diagnosis of SARS-CoV-2 by FDA under an Emergency Use Authorization (EUA). This EUA will remain in effect (meaning this test can be used) for the duration of the COVID-19 declaration under Section 564(b)(1) of the Act, 21 U.S.C. section 360bbb-3(b)(1), unless the authorization is terminated or revoked.  Performed at [facility], [address] [place], [place] [address]  Surgical pcr screen     Status: None   Collection Time: 03/01/20  1:16 AM   Specimen: Nasal Mucosa; Nasal Swab  Result Value Ref Range Status   MRSA, PCR NEGATIVE NEGATIVE Final   Staphylococcus aureus NEGATIVE NEGATIVE Final    Comment: (NOTE) The Xpert SA Assay (FDA approved for NASAL specimens in patients 42 years of age and older), is one component of a comprehensive surveillance program. It is not intended to diagnose infection nor to guide or monitor treatment. Performed at [facility], [address], [place], [place] [address]       Radiology Studies: DG Pelvis Portable  Result Date: 03/01/2020 CLINICAL DATA:  80 year old male status post right hip arthroplasty. EXAM: PORTABLE PELVIS 1-2 VIEWS COMPARISON:  Pelvic radiograph dated 02/29/2020. FINDINGS: Total right hip arthroplasty. The arthroplasty components appear intact and in anatomic alignment. There is no acute fracture or dislocation. The bones are osteopenic. Vascular calcifications noted. Cutaneous clips over the right hip. IMPRESSION: Total right hip arthroplasty. Electronically Signed   By: Elgie Collard [name]   On: 03/01/2020 19:53   DG C-Arm 1-60 Min  Result Date: 03/01/2020 CLINICAL DATA:  Right femoral neck fracture EXAM: OPERATIVE RIGHT HIP WITH PELVIS; DG C-ARM 1-60 MIN COMPARISON:  Plain film from previous day. FLUOROSCOPY TIME:  Radiation Exposure Index (as  provided by the fluoroscopic device): 0.62 mGy If the device does not provide the exposure index: Fluoroscopy Time:  6 seconds Number of Acquired Images:  6 FINDINGS: Initial images demonstrate removal of the femoral neck and femoral head. Acetabular component is noted in place. Subsequent sizing device was placed. Femoral component was then placed for completion and well seated within the acetabular component. IMPRESSION: Status post right hip replacement. Electronically Signed   By: Alcide Clever [name]   On: 03/01/2020 17:03   DG HIP OPERATIVE UNILAT W OR W/O PELVIS RIGHT  Result Date: 03/01/2020 CLINICAL DATA:  Right femoral neck fracture EXAM: OPERATIVE RIGHT HIP WITH PELVIS; DG C-ARM 1-60 MIN COMPARISON:  Plain film from previous day. FLUOROSCOPY TIME:  Radiation Exposure Index (as provided by the fluoroscopic device): 0.62 mGy If the device does not provide the exposure index: Fluoroscopy Time:  6 seconds Number of Acquired Images:  6 FINDINGS: Initial images demonstrate removal of the femoral neck and femoral head. Acetabular component is noted in place. Subsequent sizing device was placed. Femoral component was then placed for completion and well seated within the acetabular component. IMPRESSION: Status post right hip replacement. Electronically Signed   By: Alcide Clever [name]   On: 03/01/2020 17:03    Scheduled Meds: . aspirin  81 mg Oral BID WC  . calcium carbonate  1 tablet Oral TID WC  . clopidogrel  75 mg Oral Daily  . docusate sodium  100 mg Oral BID  . feeding supplement (GLUCERNA SHAKE)  237 mL Oral TID BM  . fenofibrate  160 mg Oral Daily  . insulin aspart  0-9 Units Subcutaneous Q4H  . insulin glargine  10 Units Subcutaneous Daily  . metoprolol tartrate  12.5 mg Oral BID  . multivitamin with minerals  1 tablet Oral Daily  . simvastatin  20 mg Oral QHS   Continuous Infusions: . sodium chloride 75 mL/hr at 03/03/20 0951     LOS: 3 days   Time spent: 28 minutes   Hughie Closs, MD [facility]  03/03/2020, 11:06 AM   To contact the attending provider between 7A-7P or the covering provider  during after hours 7P-7A, please log into the web site [email or web address].

## 2020-03-03 NOTE — TOC Progression Note (Signed)
Transition of Care [facility]) - Progression Note    Patient Details  Name: Daniel Tyler MRN: [id number] Date of Birth: 12-May-1940  Transition of Care [facility]) CM/SW Contact  Carmina Miller, LCSWA Phone Number: 03/03/2020, 9:53 AM  Clinical Narrative:    CSW spoke with [name], pt's POA in reference to pt's dc plans. CSW went over the two current offers, [name] states pt needs to be closer to [place]. CSW reached out to [facility], they can take pt and will start auth but can't start auth until Monday. CSW called [name] to inquire of [facility] would work, [name] states it would. CSW reached back out to [facility] to hold [facility] bed for pt. [name] would like to be contacted prior to pt being dc.    Expected Discharge Plan: Skilled Nursing Facility Barriers to Discharge: Continued Medical Work up,Insurance Authorization,No SNF bed  Expected Discharge Plan and Services Expected Discharge Plan: Skilled Nursing Facility In-house Referral: Clinical Social Work   Post Acute Care Choice: Skilled Nursing Facility Living arrangements for the past 2 months: Single Family Home                                       Social Determinants of Health (SDOH) Interventions    Readmission Risk Interventions No flowsheet data found.

## 2020-03-03 NOTE — Plan of Care (Signed)

## 2020-03-03 NOTE — Progress Notes (Signed)
Daniel Tyler  MRN: [id number] DOB/Age: April 03, 1940 80 y.o. Physician: Lynnea Maizes, [name] 2 Days Post-Op Procedure(s) (LRB): RIGHT TOTAL HIP ARTHROPLASTY ANTERIOR APPROACH (Right)  Subjective: Resting comfortably in bed this a.m. Tolerating p.o.'s without nausea/vomiting Vital Signs Temp:  [97.5 F (36.4 C)-98.4 F (36.9 C)] 97.9 F (36.6 C) (01/08 0822) Pulse Rate:  [68-89] 68 (01/08 0822) Resp:  [16-20] 17 (01/08 0822) BP: (91-148)/(46-62) 108/52 (01/08 0822) SpO2:  [90 %-95 %] 90 % (01/08 0822)  Lab Results Recent Labs    03/02/20 2159 03/03/20 0448  WBC 9.1 8.1  HGB 7.9* 7.7*  HCT 23.9* 23.1*  PLT 273 290   BMET Recent Labs    03/02/20 0212 03/03/20 0448  NA 130* 128*  K 4.7 4.6  CL 101 99  CO2 18* 21*  GLUCOSE 244* 163*  BUN 24* 41*  CREATININE 1.75* 2.09*  CALCIUM 8.1* 8.2*   INR  Date Value Ref Range Status  07/13/2012 0.92 0.00 - 1.49 Final     Exam  Right hip dressing intact with no obvious drainage or bleeding in the dressings. Compartments soft. Grossly neurovascular intact.  Plan Continue to mobilize per total hip protocol, weightbearing as tolerated right lower extremity. Shloime Keilman M Lessie Manigo 03/03/2020, 10:41 AM   Contact # [phone]

## 2020-03-04 DIAGNOSIS — S72001A Fracture of unspecified part of neck of right femur, initial encounter for closed fracture: Secondary | ICD-10-CM | POA: Diagnosis not present

## 2020-03-04 LAB — CBC WITH DIFFERENTIAL/PLATELET
Abs Immature Granulocytes: 0.03 10*3/uL (ref 0.00–0.07)
Basophils Absolute: 0 10*3/uL (ref 0.0–0.1)
Basophils Relative: 0 %
Eosinophils Absolute: 0.2 10*3/uL (ref 0.0–0.5)
Eosinophils Relative: 3 %
HCT: 23.2 % — ABNORMAL LOW (ref 39.0–52.0)
Hemoglobin: 7.7 g/dL — ABNORMAL LOW (ref 13.0–17.0)
Immature Granulocytes: 0 %
Lymphocytes Relative: 17 %
Lymphs Abs: 1.3 10*3/uL (ref 0.7–4.0)
MCH: 30 pg (ref 26.0–34.0)
MCHC: 33.2 g/dL (ref 30.0–36.0)
MCV: 90.3 fL (ref 80.0–100.0)
Monocytes Absolute: 1.1 10*3/uL — ABNORMAL HIGH (ref 0.1–1.0)
Monocytes Relative: 15 %
Neutro Abs: 5.1 10*3/uL (ref 1.7–7.7)
Neutrophils Relative %: 65 %
Platelets: 328 10*3/uL (ref 150–400)
RBC: 2.57 MIL/uL — ABNORMAL LOW (ref 4.22–5.81)
RDW: 14.1 % (ref 11.5–15.5)
WBC: 7.8 10*3/uL (ref 4.0–10.5)
nRBC: 0 % (ref 0.0–0.2)

## 2020-03-04 LAB — BASIC METABOLIC PANEL
Anion gap: 8 (ref 5–15)
BUN: 31 mg/dL — ABNORMAL HIGH (ref 8–23)
CO2: 20 mmol/L — ABNORMAL LOW (ref 22–32)
Calcium: 8.5 mg/dL — ABNORMAL LOW (ref 8.9–10.3)
Chloride: 102 mmol/L (ref 98–111)
Creatinine, Ser: 1.5 mg/dL — ABNORMAL HIGH (ref 0.61–1.24)
GFR, Estimated: 47 mL/min — ABNORMAL LOW (ref >60)
Glucose, Bld: 147 mg/dL — ABNORMAL HIGH (ref 70–99)
Potassium: 4.7 mmol/L (ref 3.5–5.1)
Sodium: 130 mmol/L — ABNORMAL LOW (ref 135–145)

## 2020-03-04 LAB — GLUCOSE, CAPILLARY
Glucose-Capillary: 151 mg/dL — ABNORMAL HIGH (ref 70–99)
Glucose-Capillary: 185 mg/dL — ABNORMAL HIGH (ref 70–99)
Glucose-Capillary: 155 mg/dL — ABNORMAL HIGH (ref 70–99)
Glucose-Capillary: 126 mg/dL — ABNORMAL HIGH (ref 70–99)

## 2020-03-04 MED ORDER — SODIUM CHLORIDE 0.9 % IV SOLN: INTRAVENOUS | Status: AC | PRN

## 2020-03-04 NOTE — Progress Notes (Signed)
PROGRESS NOTE    Daniel Tyler  BZJ:[id number] DOB: Oct 09, 1940 DOA: 02/29/2020 PCP: Dois Davenport, MD   Brief Narrative:  Daniel Tyler is a 80 y.o. male with history of CAD status post CABG, peripheral vascular disease status post femoropopliteal bypass history of carotid endarterectomy, anemia, diabetes mellitus type 2 had a fall at home when he tripped and fell at his door.  Denied hitting his head or losing consciousness.  In the ER x-rays revealed right hip fracture.  He was admitted under hospital service.  Orthopedic consulted.  Underwent right total hip arthroplasty on 03/01/2020.  Did well postoperatively except acute blood loss anemia with hemoglobin dropped to less than 7 for which she received 1 unit of PRBC transfusion on 03/02/2020.  Hemoglobin remained stable after that.   Assessment & Plan:   Principal Problem:   Closed right hip fracture, initial encounter (HCC) Active Problems:   CAD (coronary artery disease)   Insulin dependent type 2 diabetes mellitus, controlled (HCC)   S/P CABG (coronary artery bypass graft)   Hypertension   COPD (chronic obstructive pulmonary disease) (HCC)   Peripheral vascular disease, unspecified (HCC)   Hip fracture (HCC)   Right hip fracture s/p mechanical fall: Orthopedics on board.  Postoperative day 2 status post right total hip arthroplasty.  Pain controlled.  Management per Ortho.    AKI: Patient developed AKI postoperatively.  Creatinine improved after some IV fluids yesterday.  Will restart fluids for another 10 hours.  Repeat labs in the morning.  History of CAD status post CABG and history of carotid endarterectomy and PAD: Continue aspirin, Plavix and beta-blocker.  COPD: Not in exacerbation.  Resume home medications.  Type 2 diabetes mellitus: Hold glipizide and Metformin.  Blood sugar better.  Continue Lantus 10 units and SSI.  Hyperlipidemia: Continue Zocor.  Anemia of chronic disease with acute blood loss/postoperative  anemia: Baseline hemoglobin between 9 and 10.  Presented with hemoglobin of 10.5 at the time of admission which dropped to 6.6 postoperatively.  Received 1 unit of PRBC transfusion on 03/02/2020.  Hemoglobin is stable over 7 since then.  Monitor daily.  Orthostatic hypotension: Patient developed orthostatic hypotension and decreased responsiveness this morning when he was working with PT.  His blood pressure was low, systolics in 80s but he quickly improved after he was laid back in the bed.  This could very well be due to anemia.  Please see above for details.  No further episodes.  Chronic hyponatremia: At baseline for the patient.   DVT prophylaxis: SCDs Start: 03/02/20 0118, orthopedics recommends aspirin and Plavix is in for DVT prophylaxis.   Code Status: DNR  Family Communication:  None present at bedside.  Plan of care discussed with patient in length and he verbalized understanding and agreed with it.  Status is: Inpatient  Remains inpatient appropriate because: Needs surgical repair   Dispo: The patient is from: Home              Anticipated d/c is to: SNF              Anticipated d/c date is: 1 to 2 days, awaiting placement at a nursing home.              Patient currently is medically stable to d/c.        Estimated body mass index is 20.67 kg/m as calculated from the following:   Height as of this encounter: 5\' 9"  (1.753 m).   Weight as of  this encounter: 63.5 kg.      Nutritional status:  Nutrition Problem: Increased nutrient needs Etiology: post-op healing   Signs/Symptoms: estimated needs   Interventions: Glucerna shake,MVI    Consultants:   Orthopedics  Procedures:   Right hip arthroplasty  Antimicrobials:  Anti-infectives (From admission, onward)   Start     Dose/Rate Route Frequency Ordered Stop   03/02/20 0200  ceFAZolin (ANCEF) IVPB 2g/100 mL premix        2 g 200 mL/hr over 30 Minutes Intravenous Every 6 hours 03/02/20 0118 03/02/20 0930    03/01/20 1600  ceFAZolin (ANCEF) IVPB 2g/100 mL premix        2 g 200 mL/hr over 30 Minutes Intravenous On call to O.R. 03/01/20 0758 03/01/20 1612   03/01/20 0845  ceFAZolin (ANCEF) 3 g in dextrose 5 % 50 mL IVPB  Status:  Discontinued        3 g 100 mL/hr over 30 Minutes Intravenous On call to O.R. 03/01/20 [id number] 03/01/20 0801         Subjective: Seen and examined.  He has no complaints.  Objective: Vitals:   03/03/20 1418 03/03/20 2010 03/04/20 0419 03/04/20 0900  BP: (!) 126/51 (!) 156/59 (!) 137/55 (!) 147/51  Pulse: 72 85 78 80  Resp: 17 15 16 17   Temp: 97.7 F (36.5 C) 98.2 F (36.8 C) 98 F (36.7 C)   TempSrc: Oral Oral Oral Oral  SpO2: 92% 95% 93% 94%  Weight:      Height:        Intake/Output Summary (Last 24 hours) at 03/04/2020 1136 Last data filed at 03/04/2020 0400 Gross per 24 hour  Intake --  Output 1250 ml  Net -1250 ml   Filed Weights   03/01/20 1452  Weight: 63.5 kg    Examination:  General exam: Appears calm and comfortable  Respiratory system: Clear to auscultation. Respiratory effort normal. Cardiovascular system: S1 & S2 heard, RRR. No JVD, murmurs, rubs, gallops or clicks. No pedal edema. Gastrointestinal system: Abdomen is nondistended, soft and nontender. No organomegaly or masses felt. Normal bowel sounds heard. Central nervous system: Alert and oriented. No focal neurological deficits. Skin: No rashes, lesions or ulcers.  Psychiatry: Judgement and insight appear normal. Mood & affect appropriate.   Data Reviewed: I have personally reviewed following labs and imaging studies  CBC: Recent Labs  Lab 02/29/20 1720 03/01/20 1907 03/02/20 0212 03/02/20 1233 03/02/20 2159 03/03/20 0448 03/04/20 0801  WBC 9.3 17.4* 11.8*  --  9.1 8.1 7.8  NEUTROABS 7.8* 15.4*  --   --   --   --  5.1  HGB 10.5* 9.0* 7.6* 6.6* 7.9* 7.7* 7.7*  HCT 33.3* 28.8* 23.2* 20.2* 23.9* 23.1* 23.2*  MCV 93.3 94.4 91.7  --  90.5 90.2 90.3  PLT 327 388 314  --   273 290 328   Basic Metabolic Panel: Recent Labs  Lab 02/29/20 1720 03/01/20 1907 03/02/20 0212 03/03/20 0448 03/04/20 0801  NA 129* 129* 130* 128* 130*  K 4.2 4.5 4.7 4.6 4.7  CL 100 100 101 99 102  CO2 18* 17* 18* 21* 20*  GLUCOSE 163* 219* 244* 163* 147*  BUN 16 17 24* 41* 31*  CREATININE 1.03 1.11 1.75* 2.09* 1.50*  CALCIUM 9.1 8.5* 8.1* 8.2* 8.5*   GFR: Estimated Creatinine Clearance: 35.9 mL/min (A) (by C-G formula based on SCr of 1.5 mg/dL (H)). Liver Function Tests: No results for input(s): AST, ALT, ALKPHOS, BILITOT, PROT, ALBUMIN in  the last 168 hours. No results for input(s): LIPASE, AMYLASE in the last 168 hours. No results for input(s): AMMONIA in the last 168 hours. Coagulation Profile: No results for input(s): INR, PROTIME in the last 168 hours. Cardiac Enzymes: No results for input(s): CKTOTAL, CKMB, CKMBINDEX, TROPONINI in the last 168 hours. BNP (last 3 results) No results for input(s): PROBNP in the last 8760 hours. HbA1C: No results for input(s): HGBA1C in the last 72 hours. CBG: Recent Labs  Lab 03/03/20 1616 03/03/20 2011 03/03/20 2338 03/04/20 0421 03/04/20 1125  GLUCAP 188* 175* 193* 151* 185*   Lipid Profile: No results for input(s): CHOL, HDL, LDLCALC, TRIG, CHOLHDL, LDLDIRECT in the last 72 hours. Thyroid Function Tests: No results for input(s): TSH, T4TOTAL, FREET4, T3FREE, THYROIDAB in the last 72 hours. Anemia Panel: No results for input(s): VITAMINB12, FOLATE, FERRITIN, TIBC, IRON, RETICCTPCT in the last 72 hours. Sepsis Labs: No results for input(s): PROCALCITON, LATICACIDVEN in the last 168 hours.  Recent Results (from the past 240 hour(s))  Resp Panel by RT-PCR (Flu A&B, Covid) Nasopharyngeal Swab     Status: None   Collection Time: 02/29/20  6:24 PM   Specimen: Nasopharyngeal Swab; Nasopharyngeal(NP) swabs in vial transport medium  Result Value Ref Range Status   SARS Coronavirus 2 by RT PCR NEGATIVE NEGATIVE Final     Comment: (NOTE) SARS-CoV-2 target nucleic acids are NOT DETECTED.  The SARS-CoV-2 RNA is generally detectable in upper respiratory specimens during the acute phase of infection. The lowest concentration of SARS-CoV-2 viral copies this assay can detect is 138 copies/mL. A negative result does not preclude SARS-Cov-2 infection and should not be used as the sole basis for treatment or other patient management decisions. A negative result may occur with  improper specimen collection/handling, submission of specimen other than nasopharyngeal swab, presence of viral mutation(s) within the areas targeted by this assay, and inadequate number of viral copies(<138 copies/mL). A negative result must be combined with clinical observations, patient history, and epidemiological information. The expected result is Negative.  Fact Sheet for Patients:  BloggerCourse.com  Fact Sheet for Healthcare Providers:  SeriousBroker.it  This test is no t yet approved or cleared by the [place] FDA and  has been authorized for detection and/or diagnosis of SARS-CoV-2 by FDA under an Emergency Use Authorization (EUA). This EUA will remain  in effect (meaning this test can be used) for the duration of the COVID-19 declaration under Section 564(b)(1) of the Act, 21 U.S.C.section 360bbb-3(b)(1), unless the authorization is terminated  or revoked sooner.       Influenza A by PCR NEGATIVE NEGATIVE Final   Influenza B by PCR NEGATIVE NEGATIVE Final    Comment: (NOTE) The Xpert Xpress SARS-CoV-2/FLU/RSV plus assay is intended as an aid in the diagnosis of influenza from Nasopharyngeal swab specimens and should not be used as a sole basis for treatment. Nasal washings and aspirates are unacceptable for Xpert Xpress SARS-CoV-2/FLU/RSV testing.  Fact Sheet for Patients: BloggerCourse.com  Fact Sheet for Healthcare  Providers: SeriousBroker.it  This test is not yet approved or cleared by the [place] FDA and has been authorized for detection and/or diagnosis of SARS-CoV-2 by FDA under an Emergency Use Authorization (EUA). This EUA will remain in effect (meaning this test can be used) for the duration of the COVID-19 declaration under Section 564(b)(1) of the Act, 21 U.S.C. section 360bbb-3(b)(1), unless the authorization is terminated or revoked.  Performed at [facility], [address] [place], [place] [address]  Surgical pcr screen     Status: None   Collection Time: 03/01/20  1:16 AM   Specimen: Nasal Mucosa; Nasal Swab  Result Value Ref Range Status   MRSA, PCR NEGATIVE NEGATIVE Final   Staphylococcus aureus NEGATIVE NEGATIVE Final    Comment: (NOTE) The Xpert SA Assay (FDA approved for NASAL specimens in patients 28 years of age and older), is one component of a comprehensive surveillance program. It is not intended to diagnose infection nor to guide or monitor treatment. Performed at [facility], [address] [place], [place] [address]       Radiology Studies: No results found.  Scheduled Meds: . aspirin  81 mg Oral BID WC  . calcium carbonate  1 tablet Oral BID WC  . clopidogrel  75 mg Oral Daily  . docusate sodium  100 mg Oral BID  . feeding supplement (GLUCERNA SHAKE)  237 mL Oral TID BM  . insulin aspart  0-9 Units Subcutaneous Q4H  . insulin glargine  10 Units Subcutaneous Daily  . metoprolol tartrate  12.5 mg Oral BID  . multivitamin with minerals  1 tablet Oral Daily  . pantoprazole (PROTONIX) IV  40 mg Intravenous Daily  . simvastatin  20 mg Oral QHS   Continuous Infusions:    LOS: 4 days   Time spent: 27 minutes   Hughie Closs, MD [facility]  03/04/2020, 11:36 AM   To contact the attending provider between 7A-7P or the covering provider during after hours 7P-7A, please log into the web site  [email or web address].

## 2020-03-04 NOTE — Progress Notes (Signed)
Daniel Tyler  MRN: [id number] DOB/Age: [birth year] 80 y.o. Physician: Daniel Tyler, [name] 3 Days Post-Op Procedure(s) (LRB): RIGHT TOTAL HIP ARTHROPLASTY ANTERIOR APPROACH (Right)  Subjective: Complains of mild to moderate pain. No complaints of chest pain, nausea, vomiting. Vital Signs Temp:  [97.7 F (36.5 C)-98.2 F (36.8 C)] 98 F (36.7 C) (01/09 0419) Pulse Rate:  [72-85] 78 (01/09 0419) Resp:  [15-17] 16 (01/09 0419) BP: (126-156)/(51-59) 137/55 (01/09 0419) SpO2:  [92 %-95 %] 93 % (01/09 0419)  Lab Results Recent Labs    03/03/20 0448 03/04/20 0801  WBC 8.1 7.8  HGB 7.7* 7.7*  HCT 23.1* 23.2*  PLT 290 328   BMET Recent Labs    03/02/20 0212 03/03/20 0448  NA 130* 128*  K 4.7 4.6  CL 101 99  CO2 18* 21*  GLUCOSE 244* 163*  BUN 24* 41*  CREATININE 1.75* 2.09*  CALCIUM 8.1* 8.2*   INR  Date Value Ref Range Status  07/13/2012 0.92 0.00 - 1.49 Final     Exam  Right hip dressing intact with no obvious drainage or bleeding in the dressings. Compartments soft. Grossly neurovascular intact.  Plan Continue to mobilize per total hip protocol, weightbearing as tolerated w/walker.Plan on Plavix/Aspirin for DVT prophylaxis at discharge. ABLA: HgB 7.7 today, stable from yesterday. Continue to monitor Daniel Tyler 03/04/2020, 8:30 AM   Contact # [phone]

## 2020-03-04 NOTE — Plan of Care (Signed)
  Problem: Health Behavior/Discharge Planning: Goal: Ability to manage health-related needs will improve Outcome: Progressing   Problem: Clinical Measurements: Goal: Ability to maintain clinical measurements within normal limits will improve Outcome: Progressing   Problem: Activity: Goal: Risk for activity intolerance will decrease Outcome: Progressing   Problem: Pain Managment: Goal: General experience of comfort will improve Outcome: Progressing   Problem: Safety: Goal: Ability to remain free from injury will improve Outcome: Progressing   Problem: Skin Integrity: Goal: Risk for impaired skin integrity will decrease Outcome: Progressing   

## 2020-03-05 DIAGNOSIS — S72001A Fracture of unspecified part of neck of right femur, initial encounter for closed fracture: Secondary | ICD-10-CM | POA: Diagnosis not present

## 2020-03-05 LAB — BASIC METABOLIC PANEL
Anion gap: 9 (ref 5–15)
BUN: 28 mg/dL — ABNORMAL HIGH (ref 8–23)
CO2: 21 mmol/L — ABNORMAL LOW (ref 22–32)
Calcium: 8.1 mg/dL — ABNORMAL LOW (ref 8.9–10.3)
Chloride: 99 mmol/L (ref 98–111)
Creatinine, Ser: 1.21 mg/dL (ref 0.61–1.24)
GFR, Estimated: >60 mL/min (ref >60)
Glucose, Bld: 228 mg/dL — ABNORMAL HIGH (ref 70–99)
Potassium: 4.9 mmol/L (ref 3.5–5.1)
Sodium: 129 mmol/L — ABNORMAL LOW (ref 135–145)

## 2020-03-05 LAB — GLUCOSE, CAPILLARY
Glucose-Capillary: 110 mg/dL — ABNORMAL HIGH (ref 70–99)
Glucose-Capillary: 126 mg/dL — ABNORMAL HIGH (ref 70–99)
Glucose-Capillary: 136 mg/dL — ABNORMAL HIGH (ref 70–99)
Glucose-Capillary: 273 mg/dL — ABNORMAL HIGH (ref 70–99)
Glucose-Capillary: 85 mg/dL (ref 70–99)

## 2020-03-05 MED ORDER — INSULIN ASPART 100 UNIT/ML ~~LOC~~ SOLN
0.0000 [IU] | Freq: Three times a day (TID) | SUBCUTANEOUS | Status: DC
  Administered 2020-03-05: 2 [IU] via SUBCUTANEOUS
  Administered 2020-03-06 (×2): 3 [IU] via SUBCUTANEOUS
  Administered 2020-03-06: 2 [IU] via SUBCUTANEOUS
  Administered 2020-03-07: 8 [IU] via SUBCUTANEOUS
  Administered 2020-03-07: 2 [IU] via SUBCUTANEOUS

## 2020-03-05 MED ORDER — INSULIN GLARGINE 100 UNIT/ML ~~LOC~~ SOLN
5.0000 [IU] | Freq: Once | SUBCUTANEOUS | Status: AC
  Administered 2020-03-05: 5 [IU] via SUBCUTANEOUS
  Filled 2020-03-05: qty 0.05

## 2020-03-05 MED ORDER — INSULIN ASPART 100 UNIT/ML ~~LOC~~ SOLN
0.0000 [IU] | Freq: Every day | SUBCUTANEOUS | Status: DC
  Administered 2020-03-06: 3 [IU] via SUBCUTANEOUS
  Administered 2020-03-07: 2 [IU] via SUBCUTANEOUS

## 2020-03-05 MED ORDER — PANTOPRAZOLE SODIUM 40 MG PO TBEC
40.0000 mg | DELAYED_RELEASE_TABLET | Freq: Every day | ORAL | Status: DC
  Administered 2020-03-05 – 2020-03-07 (×3): 40 mg via ORAL
  Filled 2020-03-05 (×3): qty 1

## 2020-03-05 MED ORDER — INSULIN GLARGINE 100 UNIT/ML ~~LOC~~ SOLN
15.0000 [IU] | Freq: Every day | SUBCUTANEOUS | Status: DC
  Administered 2020-03-06: 15 [IU] via SUBCUTANEOUS
  Filled 2020-03-05 (×2): qty 0.15

## 2020-03-05 NOTE — Care Management Important Message (Signed)
Important Message  Patient Details  Name: Daniel Tyler MRN: [id number] Date of Birth: June 18, 1940   Medicare Important Message Given:  Yes     Sebastian Lurz P Monroe Qin 03/05/2020, 2:22 PM

## 2020-03-05 NOTE — Progress Notes (Signed)
PROGRESS NOTE    Daniel Tyler  XBM:[id number] DOB: 04-25-1940 DOA: 02/29/2020 PCP: Dois Davenportichter, Karen L, MD   Brief Narrative:  Daniel Tyler is a 80 y.o. male with history of CAD status post CABG, peripheral vascular disease status post femoropopliteal bypass history of carotid endarterectomy, anemia, diabetes mellitus type 2 had a fall at home when he tripped and fell at his door.  Denied hitting his head or losing consciousness.  In the ER x-rays revealed right hip fracture.  He was admitted under hospital service.  Orthopedic consulted.  Underwent right total hip arthroplasty on 03/01/2020.  Did well postoperatively except acute blood loss anemia with hemoglobin dropped to less than 7 for which she received 1 unit of PRBC transfusion on 03/02/2020.  Hemoglobin remained stable after that.   Assessment & Plan:   Principal Problem:   Closed right hip fracture, initial encounter (HCC) Active Problems:   CAD (coronary artery disease)   Insulin dependent type 2 diabetes mellitus, controlled (HCC)   S/P CABG (coronary artery bypass graft)   Hypertension   COPD (chronic obstructive pulmonary disease) (HCC)   Peripheral vascular disease, unspecified (HCC)   Hip fracture (HCC)   Right hip fracture s/p mechanical fall: Orthopedics on board.  Postoperative day 2 status post right total hip arthroplasty.  Pain controlled.  Management per Ortho.    AKI: Patient developed AKI postoperatively.  Creatinine improved after some IV fluids yesterday.  Received more IV fluids.  Repeat labs today.  History of CAD status post CABG and history of carotid endarterectomy and PAD: Continue aspirin, Plavix and beta-blocker.  COPD: Not in exacerbation.  Resume home medications.  Type 2 diabetes mellitus: Hold glipizide and Metformin.  Blood sugar slightly elevated.  Increase Lantus to 15 units and continue SSI.  Hyperlipidemia: Continue Zocor.  Anemia of chronic disease with acute blood loss/postoperative  anemia: Baseline hemoglobin between 9 and 10.  Presented with hemoglobin of 10.5 at the time of admission which dropped to 6.6 postoperatively.  Received 1 unit of PRBC transfusion on 03/02/2020.  Hemoglobin is stable over 7 since then.  Monitor daily.  Orthostatic hypotension: Patient developed orthostatic hypotension and decreased responsiveness this morning when he was working with PT.  His blood pressure was low, systolics in 80s but he quickly improved after he was laid back in the bed.  This could very well be due to anemia.  Please see above for details.  No further episodes.  Chronic hyponatremia: At baseline for the patient.   DVT prophylaxis: SCDs Start: 03/02/20 0118, orthopedics recommends aspirin and Plavix is in for DVT prophylaxis.   Code Status: DNR  Family Communication:  None present at bedside.  Plan of care discussed with patient in length and he verbalized understanding and agreed with it.  Status is: Inpatient  Remains inpatient appropriate because: Needs surgical repair   Dispo: The patient is from: Home              Anticipated d/c is to: SNF              Anticipated d/c date is: 1 to 2 days, awaiting placement at a nursing home.              Patient currently is medically stable to d/c.        Estimated body mass index is 20.67 kg/m as calculated from the following:   Height as of this encounter: 5\' 9"  (1.753 m).   Weight as of this encounter:  63.5 kg.      Nutritional status:  Nutrition Problem: Increased nutrient needs Etiology: post-op healing   Signs/Symptoms: estimated needs   Interventions: Glucerna shake,MVI    Consultants:   Orthopedics  Procedures:   Right hip arthroplasty  Antimicrobials:  Anti-infectives (From admission, onward)   Start     Dose/Rate Route Frequency Ordered Stop   03/02/20 0200  ceFAZolin (ANCEF) IVPB 2g/100 mL premix        2 g 200 mL/hr over 30 Minutes Intravenous Every 6 hours 03/02/20 0118 03/02/20 0930    03/01/20 1600  ceFAZolin (ANCEF) IVPB 2g/100 mL premix        2 g 200 mL/hr over 30 Minutes Intravenous On call to O.R. 03/01/20 0758 03/01/20 1612   03/01/20 0845  ceFAZolin (ANCEF) 3 g in dextrose 5 % 50 mL IVPB  Status:  Discontinued        3 g 100 mL/hr over 30 Minutes Intravenous On call to O.R. 03/01/20 2536 03/01/20 0801         Subjective: Seen and examined.  No complaints.  Pain controlled.  Objective: Vitals:   03/04/20 2100 03/05/20 0329 03/05/20 0700 03/05/20 0726  BP:  (!) 138/54  (!) 155/70  Pulse:  73  73  Resp:  15  16  Temp:  98.2 F (36.8 C)  98.1 F (36.7 C)  TempSrc:  Oral  Oral  SpO2: 99% 100% 98% 99%  Weight:      Height:        Intake/Output Summary (Last 24 hours) at 03/05/2020 1251 Last data filed at 03/05/2020 1140 Gross per 24 hour  Intake 360 ml  Output 3400 ml  Net -3040 ml   Filed Weights   03/01/20 1452  Weight: 63.5 kg    Examination:  General exam: Appears calm and comfortable  Respiratory system: Clear to auscultation. Respiratory effort normal. Cardiovascular system: S1 & S2 heard, RRR. No JVD, murmurs, rubs, gallops or clicks. No pedal edema. Gastrointestinal system: Abdomen is nondistended, soft and nontender. No organomegaly or masses felt. Normal bowel sounds heard. Central nervous system: Alert and oriented. No focal neurological deficits. Skin: No rashes, lesions or ulcers.  Psychiatry: Judgement and insight appear normal. Mood & affect appropriate.   Data Reviewed: I have personally reviewed following labs and imaging studies  CBC: Recent Labs  Lab 02/29/20 1720 03/01/20 1907 03/02/20 0212 03/02/20 1233 03/02/20 2159 03/03/20 0448 03/04/20 0801  WBC 9.3 17.4* 11.8*  --  9.1 8.1 7.8  NEUTROABS 7.8* 15.4*  --   --   --   --  5.1  HGB 10.5* 9.0* 7.6* 6.6* 7.9* 7.7* 7.7*  HCT 33.3* 28.8* 23.2* 20.2* 23.9* 23.1* 23.2*  MCV 93.3 94.4 91.7  --  90.5 90.2 90.3  PLT 327 388 314  --  273 290 328   Basic Metabolic  Panel: Recent Labs  Lab 02/29/20 1720 03/01/20 1907 03/02/20 0212 03/03/20 0448 03/04/20 0801  NA 129* 129* 130* 128* 130*  K 4.2 4.5 4.7 4.6 4.7  CL 100 100 101 99 102  CO2 18* 17* 18* 21* 20*  GLUCOSE 163* 219* 244* 163* 147*  BUN 16 17 24* 41* 31*  CREATININE 1.03 1.11 1.75* 2.09* 1.50*  CALCIUM 9.1 8.5* 8.1* 8.2* 8.5*   GFR: Estimated Creatinine Clearance: 35.9 mL/min (A) (by C-G formula based on SCr of 1.5 mg/dL (H)). Liver Function Tests: No results for input(s): AST, ALT, ALKPHOS, BILITOT, PROT, ALBUMIN in the last 168 hours. No  results for input(s): LIPASE, AMYLASE in the last 168 hours. No results for input(s): AMMONIA in the last 168 hours. Coagulation Profile: No results for input(s): INR, PROTIME in the last 168 hours. Cardiac Enzymes: No results for input(s): CKTOTAL, CKMB, CKMBINDEX, TROPONINI in the last 168 hours. BNP (last 3 results) No results for input(s): PROBNP in the last 8760 hours. HbA1C: No results for input(s): HGBA1C in the last 72 hours. CBG: Recent Labs  Lab 03/04/20 2003 03/05/20 0000 03/05/20 0331 03/05/20 0756 03/05/20 1124  GLUCAP 126* 85 110* 126* 273*   Lipid Profile: No results for input(s): CHOL, HDL, LDLCALC, TRIG, CHOLHDL, LDLDIRECT in the last 72 hours. Thyroid Function Tests: No results for input(s): TSH, T4TOTAL, FREET4, T3FREE, THYROIDAB in the last 72 hours. Anemia Panel: No results for input(s): VITAMINB12, FOLATE, FERRITIN, TIBC, IRON, RETICCTPCT in the last 72 hours. Sepsis Labs: No results for input(s): PROCALCITON, LATICACIDVEN in the last 168 hours.  Recent Results (from the past 240 hour(s))  Resp Panel by RT-PCR (Flu A&B, Covid) Nasopharyngeal Swab     Status: None   Collection Time: 02/29/20  6:24 PM   Specimen: Nasopharyngeal Swab; Nasopharyngeal(NP) swabs in vial transport medium  Result Value Ref Range Status   SARS Coronavirus 2 by RT PCR NEGATIVE NEGATIVE Final    Comment: (NOTE) SARS-CoV-2 target  nucleic acids are NOT DETECTED.  The SARS-CoV-2 RNA is generally detectable in upper respiratory specimens during the acute phase of infection. The lowest concentration of SARS-CoV-2 viral copies this assay can detect is 138 copies/mL. A negative result does not preclude SARS-Cov-2 infection and should not be used as the sole basis for treatment or other patient management decisions. A negative result may occur with  improper specimen collection/handling, submission of specimen other than nasopharyngeal swab, presence of viral mutation(s) within the areas targeted by this assay, and inadequate number of viral copies(<138 copies/mL). A negative result must be combined with clinical observations, patient history, and epidemiological information. The expected result is Negative.  Fact Sheet for Patients:  BloggerCourse.com  Fact Sheet for Healthcare Providers:  SeriousBroker.it  This test is no t yet approved or cleared by the [place] FDA and  has been authorized for detection and/or diagnosis of SARS-CoV-2 by FDA under an Emergency Use Authorization (EUA). This EUA will remain  in effect (meaning this test can be used) for the duration of the COVID-19 declaration under Section 564(b)(1) of the Act, 21 U.S.C.section 360bbb-3(b)(1), unless the authorization is terminated  or revoked sooner.       Influenza A by PCR NEGATIVE NEGATIVE Final   Influenza B by PCR NEGATIVE NEGATIVE Final    Comment: (NOTE) The Xpert Xpress SARS-CoV-2/FLU/RSV plus assay is intended as an aid in the diagnosis of influenza from Nasopharyngeal swab specimens and should not be used as a sole basis for treatment. Nasal washings and aspirates are unacceptable for Xpert Xpress SARS-CoV-2/FLU/RSV testing.  Fact Sheet for Patients: BloggerCourse.com  Fact Sheet for Healthcare  Providers: SeriousBroker.it  This test is not yet approved or cleared by the [place] FDA and has been authorized for detection and/or diagnosis of SARS-CoV-2 by FDA under an Emergency Use Authorization (EUA). This EUA will remain in effect (meaning this test can be used) for the duration of the COVID-19 declaration under Section 564(b)(1) of the Act, 21 U.S.C. section 360bbb-3(b)(1), unless the authorization is terminated or revoked.  Performed at [facility], [address], [place], [place] [address]   Surgical pcr screen  Status: None   Collection Time: 03/01/20  1:16 AM   Specimen: Nasal Mucosa; Nasal Swab  Result Value Ref Range Status   MRSA, PCR NEGATIVE NEGATIVE Final   Staphylococcus aureus NEGATIVE NEGATIVE Final    Comment: (NOTE) The Xpert SA Assay (FDA approved for NASAL specimens in patients 85 years of age and older), is one component of a comprehensive surveillance program. It is not intended to diagnose infection nor to guide or monitor treatment. Performed at [facility], [address] [place], [place] [address]       Radiology Studies: No results found.  Scheduled Meds: . aspirin  81 mg Oral BID WC  . calcium carbonate  1 tablet Oral BID WC  . clopidogrel  75 mg Oral Daily  . docusate sodium  100 mg Oral BID  . feeding supplement (GLUCERNA SHAKE)  237 mL Oral TID BM  . insulin aspart  0-9 Units Subcutaneous Q4H  . insulin glargine  10 Units Subcutaneous Daily  . metoprolol tartrate  12.5 mg Oral BID  . multivitamin with minerals  1 tablet Oral Daily  . pantoprazole  40 mg Oral Q1200  . simvastatin  20 mg Oral QHS   Continuous Infusions:    LOS: 5 days   Time spent: 26 minutes   Hughie Closs, MD [facility]  03/05/2020, 12:51 PM   To contact the attending provider between 7A-7P or the covering provider during after hours 7P-7A, please log into the web site [email or web address].

## 2020-03-05 NOTE — TOC Progression Note (Signed)
Transition of Care [facility]) - Progression Note    Patient Details  Name: Daniel Tyler MRN: [id number] Date of Birth: 1940/04/21  Transition of Care [facility]) CM/SW Contact  Epifanio Lesches, RN Phone Number: 03/05/2020, 12:59 PM  Clinical Narrative:    SNF authorization initiated by [facility] 03/06/2019. [place] admissions states authorization  can take up to 3 days 2/2 to payor source, [facility] Ad[employer] Plan.  TOC team will continue to monitor and assist with TOC need....  Expected Discharge Plan: Skilled Nursing Facility [facility]) Barriers to Discharge: Insurance Authorization  Expected Discharge Plan and Services Expected Discharge Plan: Skilled Nursing Facility [facility]) In-house Referral: Clinical Social Work   Post Acute Care Choice: Skilled Nursing Facility Living arrangements for the past 2 months: Single Family Home                                       Social Determinants of Health (SDOH) Interventions    Readmission Risk Interventions No flowsheet data found.

## 2020-03-05 NOTE — Progress Notes (Signed)
Inpatient Diabetes Program Recommendations  AACE/ADA: New Consensus Statement on Inpatient Glycemic Control (2015)  Target Ranges:  Prepandial:   less than 140 mg/dL      Peak postprandial:   less than 180 mg/dL (1-2 hours)      Critically ill patients:  140 - 180 mg/dL   Lab Results  Component Value Date   GLUCAP 273 (H) 03/05/2020   HGBA1C 6.7 (H) 02/29/2020    Review of Glycemic Control Results for Daniel Tyler, Daniel Tyler (MRN [id number]) as of 03/05/2020 14:13  Ref. Range 03/04/2020 20:03 03/05/2020 00:00 03/05/2020 03:31 03/05/2020 07:56 03/05/2020 11:24  Glucose-Capillary Latest Ref Range: 70 - 99 mg/dL 093 (H) 85 235 (H) 573 (H) 273 (H)   Diabetes history: DM 2 Outpatient Diabetes medications:  Glucotrol 10 mg bid, Metformin 1000 mg bid Current orders for Inpatient glycemic control:  Novolog sensitive q 4 hours, Lantus 15 units daily (increased today) Inpatient Diabetes Program Recommendations:    Recommend reducing frequency of Novolog correction to tid with meals and HS (bedtime scale).   Thanks,   Beryl Meager, RN, BC-ADM Inpatient Diabetes Coordinator Pager [phone] (8a-5p)

## 2020-03-05 NOTE — Plan of Care (Signed)
  Problem: Activity: Goal: Risk for activity intolerance will decrease Outcome: Progressing   Problem: Coping: Goal: Level of anxiety will decrease Outcome: Progressing   Problem: Pain Managment: Goal: General experience of comfort will improve Outcome: Progressing   Problem: Safety: Goal: Ability to remain free from injury will improve Outcome: Progressing   Problem: Skin Integrity: Goal: Risk for impaired skin integrity will decrease Outcome: Progressing   

## 2020-03-05 NOTE — Progress Notes (Signed)
Physical Therapy Treatment Patient Details Name: Daniel Tyler MRN: [id number] DOB: 06/19/1940 Today's Date: 03/05/2020    History of Present Illness Daniel Tyler is a 80 y.o. male with a past medical history significant for anemia, CAD, depression, diabetes, hyperlipidemia, history of MI who presented to the ED after mechanical fall. Patient sustained R femoral neck fx. Patient s/p R THA, anterior approach on 1/6.    PT Comments    Patient not progressing with mobility today. Lethargic initially as pt given pain medications prior to arrival per request. Pt appears confused this morning, perseverating on not feeling well, "I am sick, I am sick, I am going to die." Difficulty focusing and following commands today due to above. Requires Max A of 2 for bed mobility. Pt with dizziness once sitting EOB. Supine BP 135/52, sitting BP 100/61. Pt symptomatic with episode of decreased responsiveness/almost passing out once sitting EOB. Tolerated some there ex. Will need to increase upright tolerance to improve BP response to activity. Will follow.   Follow Up Recommendations  SNF;Supervision/Assistance - 24 hour     Equipment Recommendations  Rolling walker with 5" wheels;3in1 (PT)    Recommendations for Other Services       Precautions / Restrictions Precautions Precautions: Fall Precaution Comments: watch BPs Restrictions Weight Bearing Restrictions: Yes RLE Weight Bearing: Weight bearing as tolerated    Mobility  Bed Mobility Overal bed mobility: Needs Assistance Bed Mobility: Supine to Sit;Sit to Supine     Supine to sit: Max assist;+2 for physical assistance Sit to supine: +2 for physical assistance;+2 for safety/equipment;Max assist   General bed mobility comments: Step by step cues to get to EOB; able to initiate LEs but assist needed to reach for rail, elevate trunk and scoot bottom to EOB, + dizziness.  Transfers                 General transfer comment: Deferred  as pt with dizziness and decreased responsivesness (passing out) due to low BP.  Ambulation/Gait                 Stairs             Wheelchair Mobility    Modified Rankin (Stroke Patients Only)       Balance Overall balance assessment: Needs assistance Sitting-balance support: Feet supported;Single extremity supported Sitting balance-Leahy Scale: Poor Sitting balance - Comments: Requires UE support sitting EOB and Min-mod A, pt with limited sitting tolerance as pt with decreased responsiveness once upright, pushing posteriotly at times and other times almost passing out when trying to get BP reading. D Postural control: Right lateral lean;Posterior lean     Standing balance comment: Unable                            Cognition Arousal/Alertness: Lethargic;Suspect due to medications Behavior During Therapy: Flat affect Overall Cognitive Status: Impaired/Different from baseline Area of Impairment: Attention;Memory;Following commands;Safety/judgement;Problem solving;Orientation                 Orientation Level: Disoriented to;Time Current Attention Level: Focused Memory: Decreased short-term memory Following Commands: Follows one step commands inconsistently;Follows one step commands with increased time Safety/Judgement: Decreased awareness of deficits;Decreased awareness of safety   Problem Solving: Slow processing;Difficulty sequencing;Requires verbal cues General Comments: Pt confused this AM and internally distracted by not feeling well, perseverating on "i am sick i am sick, I am going to die."  Lethargy due to pain  meds vs low BP/almost passing on once EOB? Difficulty following commands today. Not able to elaborate on feeling sick.      Exercises Total Joint Exercises Ankle Circles/Pumps: AROM;Both;10 reps;Supine Quad Sets: AROM;Both;10 reps;Supine Long Arc Quad: AROM;AAROM;Both;10 reps;Seated    General Comments General comments (skin  integrity, edema, etc.): Supine BP 135/52, sitting BP 100/61. Pt symptomatic with episode of decreased responsiveness/almost passing out once sitting EOB.      Pertinent Vitals/Pain Pain Assessment: Faces Faces Pain Scale: Hurts whole lot Pain Location: right hip Pain Descriptors / Indicators: Aching;Sore;Guarding;Grimacing;Moaning Pain Intervention(s): Monitored during session;Repositioned;Premedicated before session;Limited activity within patient's tolerance    Home Living                      Prior Function            PT Goals (current goals can now be found in the care plan section) Progress towards PT goals: Not progressing toward goals - comment (due to orthostasis, not feeling well)    Frequency    Min 3X/week      PT Plan Current plan remains appropriate    Co-evaluation              AM-PAC PT "6 Clicks" Mobility   Outcome Measure  Help needed turning from your back to your side while in a flat bed without using bedrails?: A Lot Help needed moving from lying on your back to sitting on the side of a flat bed without using bedrails?: A Lot Help needed moving to and from a bed to a chair (including a wheelchair)?: Total Help needed standing up from a chair using your arms (e.g., wheelchair or bedside chair)?: Total Help needed to walk in hospital room?: Total Help needed climbing 3-5 steps with a railing? : Total 6 Click Score: 8    End of Session   Activity Tolerance: Treatment limited secondary to medical complications (Comment) (orthostasis) Patient left: in bed;with call bell/phone within reach;with bed alarm set;with SCD's reapplied Nurse Communication: Mobility status PT Visit Diagnosis: Unsteadiness on feet (R26.81);Other abnormalities of gait and mobility (R26.89);Muscle weakness (generalized) (M62.81);History of falling (Z91.81);Difficulty in walking, not elsewhere classified (R26.2);Pain Pain - Right/Left: Right Pain - part of body:  Hip     Time: 1884-1660 PT Time Calculation (min) (ACUTE ONLY): 18 min  Charges:  $Therapeutic Activity: 8-22 mins                     Vale Haven, PT, DPT Acute Rehabilitation Services Pager [phone] Office [phone]       Blake Divine A Lanier Ensign 03/05/2020, 12:39 PM

## 2020-03-06 DIAGNOSIS — S72001A Fracture of unspecified part of neck of right femur, initial encounter for closed fracture: Secondary | ICD-10-CM | POA: Diagnosis not present

## 2020-03-06 LAB — CBC WITH DIFFERENTIAL/PLATELET
Abs Immature Granulocytes: 0.04 10*3/uL (ref 0.00–0.07)
Basophils Absolute: 0 10*3/uL (ref 0.0–0.1)
Basophils Relative: 1 %
Eosinophils Absolute: 0.3 10*3/uL (ref 0.0–0.5)
Eosinophils Relative: 4 %
HCT: 25.6 % — ABNORMAL LOW (ref 39.0–52.0)
Hemoglobin: 8.2 g/dL — ABNORMAL LOW (ref 13.0–17.0)
Immature Granulocytes: 1 %
Lymphocytes Relative: 11 %
Lymphs Abs: 0.9 10*3/uL (ref 0.7–4.0)
MCH: 29.8 pg (ref 26.0–34.0)
MCHC: 32 g/dL (ref 30.0–36.0)
MCV: 93.1 fL (ref 80.0–100.0)
Monocytes Absolute: 1.1 10*3/uL — ABNORMAL HIGH (ref 0.1–1.0)
Monocytes Relative: 14 %
Neutro Abs: 6 10*3/uL (ref 1.7–7.7)
Neutrophils Relative %: 69 %
Platelets: 400 10*3/uL (ref 150–400)
RBC: 2.75 MIL/uL — ABNORMAL LOW (ref 4.22–5.81)
RDW: 14.3 % (ref 11.5–15.5)
WBC: 8.4 10*3/uL (ref 4.0–10.5)
nRBC: 0 % (ref 0.0–0.2)

## 2020-03-06 LAB — GLUCOSE, CAPILLARY
Glucose-Capillary: 163 mg/dL — ABNORMAL HIGH (ref 70–99)
Glucose-Capillary: 169 mg/dL — ABNORMAL HIGH (ref 70–99)
Glucose-Capillary: 271 mg/dL — ABNORMAL HIGH (ref 70–99)

## 2020-03-06 LAB — BASIC METABOLIC PANEL
Anion gap: 12 (ref 5–15)
BUN: 21 mg/dL (ref 8–23)
CO2: 20 mmol/L — ABNORMAL LOW (ref 22–32)
Calcium: 8.5 mg/dL — ABNORMAL LOW (ref 8.9–10.3)
Chloride: 96 mmol/L — ABNORMAL LOW (ref 98–111)
Creatinine, Ser: 1.07 mg/dL (ref 0.61–1.24)
GFR, Estimated: >60 mL/min (ref >60)
Glucose, Bld: 173 mg/dL — ABNORMAL HIGH (ref 70–99)
Potassium: 4.6 mmol/L (ref 3.5–5.1)
Sodium: 128 mmol/L — ABNORMAL LOW (ref 135–145)

## 2020-03-06 NOTE — Plan of Care (Signed)

## 2020-03-06 NOTE — Progress Notes (Signed)
Pt claiming that he fell on the chair. When RN entered room, pt sitting on the side of the bed. Prior, pt left on the bed eating his supper. Pt asked "why is that sofa there?" pertaining to the sofa bed that is in the room. Pt alert but disoriented to time and place. Bed alarm is on and was not heard alarming, SCDs and condom cath are still in placed. Pt denies any pain and instructed to call staff for assistance. Call bell and telephone are placed within reach.

## 2020-03-06 NOTE — Progress Notes (Signed)
PROGRESS NOTE    Daniel Tyler Show  RWE:[id number] DOB: [birth year] DOA: 02/29/2020 PCP: Dois Davenport, MD   Brief Narrative:  Daniel Tyler is a 80 y.o. male with history of CAD status post CABG, peripheral vascular disease status post femoropopliteal bypass history of carotid endarterectomy, anemia, diabetes mellitus type 2 had a fall at home when he tripped and fell at his door.  Denied hitting his head or losing consciousness.  In the ER x-rays revealed right hip fracture.  He was admitted under hospital service.  Orthopedic consulted.  Underwent right total hip arthroplasty on 03/01/2020.  Did well postoperatively except acute blood loss anemia with hemoglobin dropped to less than 7 for which she received 1 unit of PRBC transfusion on 03/02/2020.  Hemoglobin remained stable after that.   Assessment & Plan:   Principal Problem:   Closed right hip fracture, initial encounter (HCC) Active Problems:   CAD (coronary artery disease)   Insulin dependent type 2 diabetes mellitus, controlled (HCC)   S/P CABG (coronary artery bypass graft)   Hypertension   COPD (chronic obstructive pulmonary disease) (HCC)   Peripheral vascular disease, unspecified (HCC)   Hip fracture (HCC)   Right hip fracture s/p mechanical fall: Orthopedics on board.  Postoperative day 2 status post right total hip arthroplasty.  Pain controlled.  Management per Ortho.    AKI: Patient developed AKI postoperatively.  Creatinine improved after some IV fluids yesterday.  Received more IV fluids.  Repeat labs today.  History of CAD status post CABG and history of carotid endarterectomy and PAD: Continue aspirin, Plavix and beta-blocker.  COPD: Not in exacerbation.  Resume home medications.  Type 2 diabetes mellitus: Hold glipizide and Metformin.  Blood sugars are fairly controlled.  Continue Lantus 15 units and SSI.  Hyperlipidemia: Continue Zocor.  Anemia of chronic disease with acute blood loss/postoperative anemia:  Baseline hemoglobin between 9 and 10.  Presented with hemoglobin of 10.5 at the time of admission which dropped to 6.6 postoperatively.  Received 1 unit of PRBC transfusion on 03/02/2020.  Hemoglobin is stable over 7 since then.  Repeat CBC today.  Orthostatic hypotension: Patient developed orthostatic hypotension and decreased responsiveness this morning when he was working with PT.  His blood pressure was low, systolics in 80s but he quickly improved after he was laid back in the bed.  This could very well be due to anemia.  Please see above for details.  No further episodes.  Chronic hyponatremia: At baseline for the patient.  Repeating BMP today.  DVT prophylaxis: SCDs Start: 03/02/20 0118, orthopedics recommends aspirin and Plavix is in for DVT prophylaxis.   Code Status: DNR  Family Communication:  None present at bedside.  Plan of care discussed with patient in length and he verbalized understanding and agreed with it.  Status is: Inpatient  Remains inpatient appropriate because: Awaiting safe discharge to SNF/awaiting bed placement/insurance authorization   Dispo: The patient is from: Home              Anticipated d/c is to: SNF              Anticipated d/c date is: 1 to 2 days, awaiting placement at a nursing home.              Patient currently is medically stable to d/c.        Estimated body mass index is 20.67 kg/m as calculated from the following:   Height as of this encounter: 5\' 9"  (1.753  m).   Weight as of this encounter: 63.5 kg.      Nutritional status:  Nutrition Problem: Increased nutrient needs Etiology: post-op healing   Signs/Symptoms: estimated needs   Interventions: Glucerna shake,MVI    Consultants:   Orthopedics  Procedures:   Right hip arthroplasty  Antimicrobials:  Anti-infectives (From admission, onward)   Start     Dose/Rate Route Frequency Ordered Stop   03/02/20 0200  ceFAZolin (ANCEF) IVPB 2g/100 mL premix        2 g 200  mL/hr over 30 Minutes Intravenous Every 6 hours 03/02/20 0118 03/02/20 0930   03/01/20 1600  ceFAZolin (ANCEF) IVPB 2g/100 mL premix        2 g 200 mL/hr over 30 Minutes Intravenous On call to O.R. 03/01/20 0758 03/01/20 1612   03/01/20 0845  ceFAZolin (ANCEF) 3 g in dextrose 5 % 50 mL IVPB  Status:  Discontinued        3 g 100 mL/hr over 30 Minutes Intravenous On call to O.R. 03/01/20 [id number] 03/01/20 0801         Subjective: Seen and examined.  No complaints other than some right lower extremity cramps which cause hip pain but that is very infrequent now.   Objective: Vitals:   03/05/20 1500 03/05/20 1900 03/05/20 1957 03/06/20 0814  BP: 123/62  (!) 140/54 (!) 143/61  Pulse: 71   80  Resp: 17   17  Temp: 98.3 F (36.8 C) 98 F (36.7 C) 98.1 F (36.7 C) 98.1 F (36.7 C)  TempSrc: Oral  Oral Oral  SpO2: 98%  96% 91%  Weight:      Height:        Intake/Output Summary (Last 24 hours) at 03/06/2020 1044 Last data filed at 03/06/2020 0600 Gross per 24 hour  Intake 480 ml  Output 2800 ml  Net -2320 ml   Filed Weights   03/01/20 1452  Weight: 63.5 kg    Examination:  General exam: Appears calm and comfortable  Respiratory system: Clear to auscultation. Respiratory effort normal. Cardiovascular system: S1 & S2 heard, RRR. No JVD, murmurs, rubs, gallops or clicks. No pedal edema. Gastrointestinal system: Abdomen is nondistended, soft and nontender. No organomegaly or masses felt. Normal bowel sounds heard. Central nervous system: Alert and oriented. No focal neurological deficits.   Data Reviewed: I have personally reviewed following labs and imaging studies  CBC: Recent Labs  Lab 02/29/20 1720 03/01/20 1907 03/02/20 0212 03/02/20 1233 03/02/20 2159 03/03/20 0448 03/04/20 0801  WBC 9.3 17.4* 11.8*  --  9.1 8.1 7.8  NEUTROABS 7.8* 15.4*  --   --   --   --  5.1  HGB 10.5* 9.0* 7.6* 6.6* 7.9* 7.7* 7.7*  HCT 33.3* 28.8* 23.2* 20.2* 23.9* 23.1* 23.2*  MCV 93.3 94.4  91.7  --  90.5 90.2 90.3  PLT 327 388 314  --  273 290 328   Basic Metabolic Panel: Recent Labs  Lab 03/01/20 1907 03/02/20 0212 03/03/20 0448 03/04/20 0801 03/05/20 1306  NA 129* 130* 128* 130* 129*  K 4.5 4.7 4.6 4.7 4.9  CL 100 101 99 102 99  CO2 17* 18* 21* 20* 21*  GLUCOSE 219* 244* 163* 147* 228*  BUN 17 24* 41* 31* 28*  CREATININE 1.11 1.75* 2.09* 1.50* 1.21  CALCIUM 8.5* 8.1* 8.2* 8.5* 8.1*   GFR: Estimated Creatinine Clearance: 44.5 mL/min (by C-G formula based on SCr of 1.21 mg/dL). Liver Function Tests: No results for input(s): AST, ALT,  ALKPHOS, BILITOT, PROT, ALBUMIN in the last 168 hours. No results for input(s): LIPASE, AMYLASE in the last 168 hours. No results for input(s): AMMONIA in the last 168 hours. Coagulation Profile: No results for input(s): INR, PROTIME in the last 168 hours. Cardiac Enzymes: No results for input(s): CKTOTAL, CKMB, CKMBINDEX, TROPONINI in the last 168 hours. BNP (last 3 results) No results for input(s): PROBNP in the last 8760 hours. HbA1C: No results for input(s): HGBA1C in the last 72 hours. CBG: Recent Labs  Lab 03/05/20 0000 03/05/20 0331 03/05/20 0756 03/05/20 1124 03/05/20 1651  GLUCAP 85 110* 126* 273* 136*   Lipid Profile: No results for input(s): CHOL, HDL, LDLCALC, TRIG, CHOLHDL, LDLDIRECT in the last 72 hours. Thyroid Function Tests: No results for input(s): TSH, T4TOTAL, FREET4, T3FREE, THYROIDAB in the last 72 hours. Anemia Panel: No results for input(s): VITAMINB12, FOLATE, FERRITIN, TIBC, IRON, RETICCTPCT in the last 72 hours. Sepsis Labs: No results for input(s): PROCALCITON, LATICACIDVEN in the last 168 hours.  Recent Results (from the past 240 hour(s))  Resp Panel by RT-PCR (Flu A&B, Covid) Nasopharyngeal Swab     Status: None   Collection Time: 02/29/20  6:24 PM   Specimen: Nasopharyngeal Swab; Nasopharyngeal(NP) swabs in vial transport medium  Result Value Ref Range Status   SARS Coronavirus 2 by  RT PCR NEGATIVE NEGATIVE Final    Comment: (NOTE) SARS-CoV-2 target nucleic acids are NOT DETECTED.  The SARS-CoV-2 RNA is generally detectable in upper respiratory specimens during the acute phase of infection. The lowest concentration of SARS-CoV-2 viral copies this assay can detect is 138 copies/mL. A negative result does not preclude SARS-Cov-2 infection and should not be used as the sole basis for treatment or other patient management decisions. A negative result may occur with  improper specimen collection/handling, submission of specimen other than nasopharyngeal swab, presence of viral mutation(s) within the areas targeted by this assay, and inadequate number of viral copies(<138 copies/mL). A negative result must be combined with clinical observations, patient history, and epidemiological information. The expected result is Negative.  Fact Sheet for Patients:  BloggerCourse.com  Fact Sheet for Healthcare Providers:  SeriousBroker.it  This test is no t yet approved or cleared by the [place] FDA and  has been authorized for detection and/or diagnosis of SARS-CoV-2 by FDA under an Emergency Use Authorization (EUA). This EUA will remain  in effect (meaning this test can be used) for the duration of the COVID-19 declaration under Section 564(b)(1) of the Act, 21 U.S.C.section 360bbb-3(b)(1), unless the authorization is terminated  or revoked sooner.       Influenza A by PCR NEGATIVE NEGATIVE Final   Influenza B by PCR NEGATIVE NEGATIVE Final    Comment: (NOTE) The Xpert Xpress SARS-CoV-2/FLU/RSV plus assay is intended as an aid in the diagnosis of influenza from Nasopharyngeal swab specimens and should not be used as a sole basis for treatment. Nasal washings and aspirates are unacceptable for Xpert Xpress SARS-CoV-2/FLU/RSV testing.  Fact Sheet for Patients: BloggerCourse.com  Fact Sheet  for Healthcare Providers: SeriousBroker.it  This test is not yet approved or cleared by the [place] FDA and has been authorized for detection and/or diagnosis of SARS-CoV-2 by FDA under an Emergency Use Authorization (EUA). This EUA will remain in effect (meaning this test can be used) for the duration of the COVID-19 declaration under Section 564(b)(1) of the Act, 21 U.S.C. section 360bbb-3(b)(1), unless the authorization is terminated or revoked.  Performed at [facility], [address], [place], [place] [address]   Surgical pcr screen     Status: None   Collection Time: 03/01/20  1:16 AM   Specimen: Nasal Mucosa; Nasal Swab  Result Value Ref Range Status   MRSA, PCR NEGATIVE NEGATIVE Final   Staphylococcus aureus NEGATIVE NEGATIVE Final    Comment: (NOTE) The Xpert SA Assay (FDA approved for NASAL specimens in patients 77 years of age and older), is one component of a comprehensive surveillance program. It is not intended to diagnose infection nor to guide or monitor treatment. Performed at [facility], [address] [place], [place] [address]       Radiology Studies: No results found.  Scheduled Meds: . aspirin  81 mg Oral BID WC  . calcium carbonate  1 tablet Oral BID WC  . clopidogrel  75 mg Oral Daily  . docusate sodium  100 mg Oral BID  . feeding supplement (GLUCERNA SHAKE)  237 mL Oral TID BM  . insulin aspart  0-15 Units Subcutaneous TID WC  . insulin aspart  0-5 Units Subcutaneous QHS  . insulin glargine  15 Units Subcutaneous Daily  . metoprolol tartrate  12.5 mg Oral BID  . multivitamin with minerals  1 tablet Oral Daily  . pantoprazole  40 mg Oral Q1200  . simvastatin  20 mg Oral QHS   Continuous Infusions:    LOS: 6 days   Time spent: 25 minutes   Hughie Closs, MD [facility]  03/06/2020, 10:44 AM   To contact the attending provider between 7A-7P or the covering provider during after  hours 7P-7A, please log into the web site [email or web address].

## 2020-03-07 DIAGNOSIS — S72001A Fracture of unspecified part of neck of right femur, initial encounter for closed fracture: Secondary | ICD-10-CM | POA: Diagnosis not present

## 2020-03-07 LAB — GLUCOSE, CAPILLARY
Glucose-Capillary: 135 mg/dL — ABNORMAL HIGH (ref 70–99)
Glucose-Capillary: 251 mg/dL — ABNORMAL HIGH (ref 70–99)
Glucose-Capillary: 107 mg/dL — ABNORMAL HIGH (ref 70–99)
Glucose-Capillary: 203 mg/dL — ABNORMAL HIGH (ref 70–99)

## 2020-03-07 LAB — SARS CORONAVIRUS 2 (TAT 6-24 HRS): SARS Coronavirus 2: NEGATIVE

## 2020-03-07 MED ORDER — INSULIN GLARGINE 100 UNIT/ML ~~LOC~~ SOLN
20.0000 [IU] | Freq: Every day | SUBCUTANEOUS | Status: DC
  Administered 2020-03-07: 20 [IU] via SUBCUTANEOUS
  Filled 2020-03-07 (×2): qty 0.2

## 2020-03-07 NOTE — Plan of Care (Signed)

## 2020-03-07 NOTE — Plan of Care (Signed)

## 2020-03-07 NOTE — Progress Notes (Signed)
Pt with discharge orders, insurance was approved for pt to go to rehab, report called to Lilly-LPN @ [facility]-all questions answered. PTAR was called by Joycie Peek, Child psychotherapist, just awaiting pickup.  Antony Contras, Charity fundraiser, BSN

## 2020-03-07 NOTE — Progress Notes (Signed)
Physical Therapy Treatment Patient Details Name: Daniel Tyler MRN: [id number] DOB: [birth year] Today's Date: 03/07/2020    History of Present Illness ARLING CERONE is a 80 y.o. male with a past medical history significant for anemia, CAD, depression, diabetes, hyperlipidemia, history of MI who presented to the ED after mechanical fall. Patient sustained R femoral neck fx. Patient s/p R THA, anterior approach on 1/6. Orthostatic during PT sessions 1/10 and 1/12.    PT Comments    Pt supine on arrival, A&O x2 but agreeable to therapy session and with good participation and fair tolerance for mobility. Pt limited due to symptomatic orthostatic hypotension, RN notified.  Orthostatic BPs Supine 110/67 (81)  Sitting 109/54 (71)  Standing to sitting (unable to stand >1 min) 74/48 (55)  Supine 113/54 (68)  Pt performed static sitting EOB with mostly min guard and able to tolerate sitting EOB ~5 mins without sxs prior to standing trial. Pt c/o significant dizziness/feeling nauseated after standing transfer and returned to supine with +60minA. Encouraged ankle pumps hourly but pt will need reinforcement due to cognitive deficit. Pt positioned with bed in chair position and SCDs donned at end of session for improved hemodynamics. Pt continues to benefit from PT services to progress toward functional mobility goals. D/C recs below remain appropriate.   Follow Up Recommendations  SNF;Supervision/Assistance - 24 hour     Equipment Recommendations  Rolling walker with 5" wheels;3in1 (PT)    Recommendations for Other Services       Precautions / Restrictions Precautions Precautions: Fall Precaution Comments: watch BPs Restrictions Weight Bearing Restrictions: Yes RLE Weight Bearing: Weight bearing as tolerated    Mobility  Bed Mobility Overal bed mobility: Needs Assistance Bed Mobility: Rolling;Sidelying to Sit;Sit to Sidelying Rolling: Min assist Sidelying to sit: Min assist;+2 for  physical assistance;HOB elevated     Sit to sidelying: Min assist;+2 for physical assistance General bed mobility comments: Assist with legs and trunk, step by step vc for sequencing via log roll and use of bed rail; pt denies dizziness with initial transition to EOB  Transfers Overall transfer level: Needs assistance Equipment used: Rolling walker (2 wheeled) Transfers: Sit to/from Stand Sit to Stand: Mod assist;+2 physical assistance         General transfer comment: from EOB<>RW, pt needs multimodal cues for technique, pt dizzy upon standing and able to stand ~53minute prior to impulsively sitting  Ambulation/Gait             General Gait Details: pt unable, orthostatic hypotension sxs   Stairs             Wheelchair Mobility    Modified Rankin (Stroke Patients Only)       Balance Overall balance assessment: Needs assistance Sitting-balance support: Bilateral upper extremity supported;Feet supported Sitting balance-Leahy Scale: Poor Sitting balance - Comments: posterior lean at times, mostly min to min guard assist for safety Postural control: Posterior lean Standing balance support: Bilateral upper extremity supported Standing balance-Leahy Scale: Poor Standing balance comment: pt needs RW support and external assist                            Cognition Arousal/Alertness: Awake/alert Behavior During Therapy: WFL for tasks assessed/performed Overall Cognitive Status: Within Functional Limits for tasks assessed Area of Impairment: Attention;Memory;Following commands;Safety/judgement;Problem solving;Orientation                 Orientation Level: Disoriented to;Place;Time Current Attention Level: Focused Memory:  Decreased short-term memory Following Commands: Follows one step commands inconsistently;Follows one step commands with increased time Safety/Judgement: Decreased awareness of deficits;Decreased awareness of safety   Problem  Solving: Slow processing;Difficulty sequencing;Requires verbal cues General Comments: pt confused at times, reports he isn't sure of location but remembers falling, pt not oriented to time but PTA informed pt they plan to DC him today to SNF and pt requesting to take clothing with him so is somewhat aware but internally distracted/anxious at times      Exercises Total Joint Exercises Ankle Circles/Pumps: AROM;Both;Supine;20 reps;Seated    General Comments General comments (skin integrity, edema, etc.): Supine BP 110/67 (81), Seated BP 109/54 (71), Stand to sit BP 74/48 (55), Supine BP 113/54 (68), RN notified sx drop in BP      Pertinent Vitals/Pain Pain Assessment: Faces Faces Pain Scale: Hurts even more Pain Location: right hip Pain Descriptors / Indicators: Aching;Sore Pain Intervention(s): Monitored during session;Repositioned;Patient requesting pain meds-RN notified    Home Living                      Prior Function            PT Goals (current goals can now be found in the care plan section) Acute Rehab PT Goals Patient Stated Goal: to get out of hospital PT Goal Formulation: With patient Time For Goal Achievement: 03/16/20 Potential to Achieve Goals: Fair Progress towards PT goals: Progressing toward goals    Frequency    Min 3X/week      PT Plan Current plan remains appropriate    Co-evaluation PT/OT/SLP Co-Evaluation/Treatment: Yes Reason for Co-Treatment: Complexity of the patient's impairments (multi-system involvement);Necessary to address cognition/behavior during functional activity;For patient/therapist safety;To address functional/ADL transfers PT goals addressed during session: Mobility/safety with mobility;Balance;Proper use of DME;Strengthening/ROM        AM-PAC PT "6 Clicks" Mobility   Outcome Measure  Help needed turning from your back to your side while in a flat bed without using bedrails?: A Little Help needed moving from lying  on your back to sitting on the side of a flat bed without using bedrails?: A Little Help needed moving to and from a bed to a chair (including a wheelchair)?: A Lot Help needed standing up from a chair using your arms (e.g., wheelchair or bedside chair)?: A Lot Help needed to walk in hospital room?: Total Help needed climbing 3-5 steps with a railing? : Total 6 Click Score: 12    End of Session Equipment Utilized During Treatment: Gait belt Activity Tolerance: Patient limited by pain;Patient tolerated treatment well (orthostasis and decreased responsiveness) Patient left: in bed;with call bell/phone within reach;with bed alarm set Nurse Communication: Mobility status;Patient requests pain meds;Other (comment) (orthostatic vitals) PT Visit Diagnosis: Unsteadiness on feet (R26.81);Other abnormalities of gait and mobility (R26.89);Muscle weakness (generalized) (M62.81);History of falling (Z91.81);Difficulty in walking, not elsewhere classified (R26.2) Pain - Right/Left: Right Pain - part of body: Hip     Time: 4315-4008 PT Time Calculation (min) (ACUTE ONLY): 26 min  Charges:  $Therapeutic Activity: 8-22 mins                     Aubrey Blackard P., PTA Acute Rehabilitation Services Pager: [phone] Office: [phone]   Dorathy Kinsman Electa Sterry 03/07/2020, 1:23 PM

## 2020-03-07 NOTE — Progress Notes (Signed)
Ambulance here to transport patient to [facility]. IV removed, AVS given to transport. No needs identified by patient. Pt belongings given to transport.

## 2020-03-07 NOTE — Progress Notes (Signed)
Occupational Therapy Treatment Patient Details Name: Daniel Tyler MRN: [id number] DOB: 01/27/1941 Today's Date: 03/07/2020    History of present illness Daniel Tyler is a 80 y.o. male with a past medical history significant for anemia, CAD, depression, diabetes, hyperlipidemia, history of MI who presented to the ED after mechanical fall. Patient sustained R femoral neck fx. Patient s/p R THA, anterior approach on 1/6. Orthostatic during PT sessions 1/10 and 1/12.   OT comments  Pt progressing towards acute OT goals. Limited this session by onset of symptomatic orthostatic hypotension in standing.  Orthostatic BPs Supine 110/67 (81)  Sitting 109/54 (71)  Standing to sitting (unable to stand >1 min) 74/48 (55)  Supine 113/54 (68)   1x sit<>stand transfer with mod A +2. Stood for almost a minute before onset of "weakness" and initiating sitting back down. Returned to supine. D/c plan remains appropriate.    Follow Up Recommendations  SNF;Supervision/Assistance - 24 hour    Equipment Recommendations  Other (comment) (TBD next venue)    Recommendations for Other Services      Precautions / Restrictions Precautions Precautions: Fall Precaution Comments: watch BPs Restrictions Weight Bearing Restrictions: Yes RLE Weight Bearing: Weight bearing as tolerated       Mobility Bed Mobility Overal bed mobility: Needs Assistance Bed Mobility: Rolling;Sidelying to Sit;Sit to Sidelying Rolling: Min assist Sidelying to sit: Min assist;+2 for physical assistance;HOB elevated     Sit to sidelying: Min assist;+2 for physical assistance General bed mobility comments: Assist with legs and trunk, step by step vc for sequencing via log roll and use of bed rail; pt denies dizziness with initial transition to EOB  Transfers Overall transfer level: Needs assistance Equipment used: Rolling walker (2 wheeled) Transfers: Sit to/from Stand Sit to Stand: Mod assist;+2 physical assistance          General transfer comment: from EOB<>RW, pt needs multimodal cues for technique, pt dizzy upon standing and able to stand ~66minute prior to impulsively sitting    Balance Overall balance assessment: Needs assistance Sitting-balance support: Bilateral upper extremity supported;Feet supported Sitting balance-Leahy Scale: Poor Sitting balance - Comments: posterior lean at times, mostly min to min guard assist for safety Postural control: Posterior lean Standing balance support: Bilateral upper extremity supported Standing balance-Leahy Scale: Poor Standing balance comment: pt needs RW support and external assist                           ADL either performed or assessed with clinical judgement   ADL Overall ADL's : Needs assistance/impaired                     Lower Body Dressing: Maximal assistance;+2 for physical assistance;Moderate assistance;Sit to/from stand;+2 for safety/equipment                 General ADL Comments: Mod A +2 with sit<>stand transfers. Max with [employer]. Session limited by symptomatic orthostatic hypotension in standing. Stood almost a minute before suddenly sitting back down.     Vision       Perception     Praxis      Cognition Arousal/Alertness: Awake/alert Behavior During Therapy: WFL for tasks assessed/performed;Anxious Overall Cognitive Status: No family/caregiver present to determine baseline cognitive functioning Area of Impairment: Attention;Memory;Following commands;Safety/judgement;Problem solving;Orientation                 Orientation Level: Disoriented to;Place;Time Current Attention Level: Focused Memory: Decreased short-term  memory Following Commands: Follows one step commands inconsistently;Follows one step commands with increased time Safety/Judgement: Decreased awareness of deficits;Decreased awareness of safety   Problem Solving: Slow processing;Difficulty sequencing;Requires  verbal cues General Comments: pt confused at times, reports he isn't sure of location but remembers falling, pt not oriented to time but PTA informed pt they plan to DC him today to SNF and pt requesting to take clothing with him so is somewhat aware but internally distracted/anxious at times        Exercises Exercises: General Lower Extremity Total Joint Exercises Ankle Circles/Pumps: AROM;Both;Supine;20 reps;Seated   Shoulder Instructions       General Comments Supine BP 110/67 (81), Seated BP 109/54 (71), Stand to sit BP 74/48 (55), Supine BP 113/54 (68), RN notified sx drop in BP    Pertinent Vitals/ Pain       Pain Assessment: Faces Faces Pain Scale: Hurts even more Pain Location: right hip Pain Descriptors / Indicators: Aching;Sore Pain Intervention(s): Monitored during session;Repositioned;Patient requesting pain meds-RN notified  Home Living                                          Prior Functioning/Environment              Frequency  Min 2X/week        Progress Toward Goals  OT Goals(current goals can now be found in the care plan section)  Progress towards OT goals: Progressing toward goals  Acute Rehab OT Goals Patient Stated Goal: to get out of hospital OT Goal Formulation: With patient Time For Goal Achievement: 03/16/20 Potential to Achieve Goals: Good ADL Goals Pt Will Perform Grooming: with set-up;with supervision;standing Pt Will Perform Upper Body Bathing: with set-up;sitting Pt Will Perform Lower Body Bathing: with set-up;with supervision;sit to/from stand;with adaptive equipment Pt Will Perform Upper Body Dressing: with set-up;sitting Pt Will Perform Lower Body Dressing: with set-up;with supervision;with adaptive equipment;sit to/from stand Pt Will Transfer to Toilet: with supervision;ambulating;bedside commode Pt Will Perform Toileting - Clothing Manipulation and hygiene: with supervision;sit to/from stand Additional ADL  Goal #1: pt will be Mod I in and OOB for basic ADLs  Plan Discharge plan remains appropriate    Co-evaluation    PT/OT/SLP Co-Evaluation/Treatment: Yes Reason for Co-Treatment: Complexity of the patient's impairments (multi-system involvement);Necessary to address cognition/behavior during functional activity;For patient/therapist safety;To address functional/ADL transfers PT goals addressed during session: Mobility/safety with mobility;Balance;Proper use of DME;Strengthening/ROM OT goals addressed during session: ADL's and self-care      AM-PAC OT "6 Clicks" Daily Activity     Outcome Measure   Help from another person eating meals?: None Help from another person taking care of personal grooming?: A Little Help from another person toileting, which includes using toliet, bedpan, or urinal?: Total Help from another person bathing (including washing, rinsing, drying)?: A Lot Help from another person to put on and taking off regular upper body clothing?: A Lot Help from another person to put on and taking off regular lower body clothing?: Total 6 Click Score: 13    End of Session Equipment Utilized During Treatment: Gait belt;Rolling walker  OT Visit Diagnosis: Unsteadiness on feet (R26.81);Muscle weakness (generalized) (M62.81);Other abnormalities of gait and mobility (R26.89);Pain Pain - Right/Left: Right Pain - part of body: Hip   Activity Tolerance Other (comment) (symptomatic orthostatic hypotention in standing)   Patient Left in bed;with call bell/phone within reach;with bed alarm  set   Nurse Communication Other (comment) (BP drop in standing)        Time: 3299-2426 OT Time Calculation (min): 26 min  Charges: OT General Charges $OT Visit: 1 Visit OT Treatments $Self Care/Home Management : 8-22 mins  Raynald Kemp, OT Acute Rehabilitation Services Pager: [phone] Office: [phone]    Pilar Grammes 03/07/2020, 2:04 PM

## 2020-03-07 NOTE — TOC Transition Note (Signed)
Transition of Care [facility]) - CM/SW Discharge Note   Patient Details  Name: ZEBULEN SIMONIS MRN: [id number] Date of Birth: June 11, 1940  Transition of [facility]) CM/SW Contact:  Erin Sons, LCSW Phone Number: 03/07/2020, 2:24 PM   Clinical Narrative:     Patient will DC to: [facility] Anticipated DC date: 03/07/20 Family notified: Venia Carbon Transport by: Sharin Mons   Per MD patient ready for DC to [facility]. RN, patient, patient's family, and facility notified of DC. Discharge Summary and FL2 sent to facility. RN to call report prior to discharge [phone] room 1204). DC packet on chart. Ambulance transport requested for patient.   CSW will sign off for now as social work intervention is no longer needed. Please consult [place] again if new needs arise.   Final next level of care: Skilled Nursing Facility Barriers to Discharge: No Barriers Identified   Patient Goals and CMS Choice Patient states their goals for this hospitalization and ongoing recovery are:: Return home CMS Medicare.gov Compare Post Acute Care list provided to:: Patient Choice offered to / list presented to : Patient  Discharge Placement              Patient chooses bed at: [facility] Patient to be transferred to facility by: [facility] Name of family member notified: Venia Carbon Patient and family notified of of transfer: 03/07/20  Discharge Plan and Services In-house Referral: Clinical Social Work   Post Acute Care Choice: Skilled Nursing Facility                               Social Determinants of Health (SDOH) Interventions     Readmission Risk Interventions No flowsheet data found.

## 2020-03-07 NOTE — Discharge Summary (Signed)
Physician Discharge Summary  Daniel Tyler LHT:[id number] DOB: 1940-06-01 DOA: 02/29/2020  PCP: Dois Davenport, MD  Admit date: 02/29/2020 Discharge date: 03/07/2020 30 Day Unplanned Readmission Risk Score   Flowsheet Row ED to Hosp-Admission (Current) from 02/29/2020 in [facility]  30 Day Unplanned Readmission Risk Score (%) 17.19 Filed at 03/07/2020 0801     This score is the patient's risk of an unplanned readmission within 30 days of being discharged (0 -100%). The score is based on dignosis, age, lab data, medications, orders, and past utilization.   Low:  0-14.9   Medium: 15-21.9   High: 22-29.9   Extreme: 30 and above         Admitted From: Home Disposition: SNF  Recommendations for Outpatient Follow-up:  1. Follow up with PCP in 1-2 weeks 2. Follow-up with orthopedics in 1 to 2 weeks 3. Please obtain BMP/CBC in one week 4. Please follow up with your PCP on the following pending results: Unresulted Labs (From admission, onward)          Start     Ordered   03/07/20 0913  SARS CORONAVIRUS 2 (TAT 6-24 HRS) Nasopharyngeal Nasopharyngeal Swab  (Tier 3 - Symptomatic/asymptomatic with Precautions)  Once,   R       Question Answer Comment  Is this test for diagnosis or screening Screening   Symptomatic for COVID-19 as defined by CDC No   Hospitalized for COVID-19 No   Admitted to ICU for COVID-19 No   Previously tested for COVID-19 Yes   Resident in a congregate (group) care setting No   Employed in healthcare setting No   Has patient completed COVID vaccination(s) (2 doses of Pfizer/Moderna 1 dose of [employer]) Unknown      03/07/20 0913            Home Health: None Equipment/Devices: None  Discharge Condition: Stable CODE STATUS: DNR Diet recommendation: Cardiac  Subjective: Seen and examined.  No complaints.  Alert and oriented.  Brief/Interim Summary: Daniel Tyler a 80 y.o.malewithhistory of CAD status post  CABG, peripheral vascular disease status post femoropopliteal bypass history of carotid endarterectomy, anemia, diabetes mellitus type 2 had a fall at home when he tripped and fell at his door. Denied hitting his head or losing consciousness. In the ER x-rays revealed right hip fracture.  He was admitted under hospital service.  Orthopedic consulted.  Underwent right total hip arthroplasty on 03/01/2020.  Did well postoperatively except acute blood loss anemia with hemoglobin dropped to less than 7 for which she received 1 unit of PRBC transfusion on 03/02/2020.  Hemoglobin remained stable after that.  He was seen by PT OT who recommended SNF which has been arranged for him so he is going to be discharged today in stable condition.  He will be on aspirin and Plavix for DVT prophylaxis per orthopedics.  Discharge Diagnoses:  Principal Problem:   Closed right hip fracture, initial encounter [facility]) Active Problems:   CAD (coronary artery disease)   Insulin dependent type 2 diabetes mellitus, controlled (HCC)   S/P CABG (coronary artery bypass graft)   Hypertension   COPD (chronic obstructive pulmonary disease) (HCC)   Peripheral vascular disease, unspecified (HCC)   Hip fracture (HCC)    Discharge Instructions   Allergies as of 03/07/2020   No Known Allergies     Medication List    STOP taking these medications   acetaminophen 500 MG tablet Commonly known as: TYLENOL  aspirin EC 81 MG tablet Replaced by: aspirin 81 MG chewable tablet     TAKE these medications   aspirin 81 MG chewable tablet Chew 1 tablet (81 mg total) by mouth 2 (two) times daily with a meal. Replaces: aspirin EC 81 MG tablet   clopidogrel 75 MG tablet Commonly known as: PLAVIX Take 1 tablet by mouth once daily What changed: Another medication with the same name was removed. Continue taking this medication, and follow the directions you see here.   fenofibrate 160 MG tablet Take 160 mg by mouth daily.    glipiZIDE 10 MG tablet Commonly known as: GLUCOTROL Take 10 mg by mouth 2 (two) times daily before a meal.   HYDROcodone-acetaminophen 7.5-325 MG tablet Commonly known as: NORCO Take 1-2 tablets by mouth every 6 (six) hours as needed for severe pain (pain score 7-10).   magnesium oxide 400 (241.3 Mg) MG tablet Commonly known as: MAG-OX Take 1 tablet (400 mg total) by mouth 2 (two) times daily.   metFORMIN 1000 MG tablet Commonly known as: GLUCOPHAGE TAKE ONE TABLET BY MOUTH TWICE DAILY FOR  DIABETES What changed: See the new instructions.   metoprolol tartrate 25 MG tablet Commonly known as: LOPRESSOR Take 12.5 mg by mouth 2 (two) times daily.   simvastatin 40 MG tablet Commonly known as: ZOCOR Take 1 tablet (40 mg total) by mouth at bedtime.   simvastatin 20 MG tablet Commonly known as: ZOCOR Take 20 mg by mouth daily.       Follow-up Information    Swinteck, Arlys JohnBrian, MD. Schedule an appointment as soon as possible for a visit in 2 weeks.   Specialty: Orthopedic Surgery Why: For wound re-check, For suture removal Contact information: [address] [place] [address] [phone]        Dois Davenportichter, Karen L, MD Follow up in 1 week(s).   Specialty: Family Medicine Contact information: [address] [place] [phone]              No Known Allergies  Consultations: Orthopedics   Procedures/Studies: DG Chest 1 View  Result Date: 02/29/2020 CLINICAL DATA:  Fall with right hip pain EXAM: CHEST  1 VIEW COMPARISON:  07/04/2011, 07/20/2015 FINDINGS: Post sternotomy changes. Emphysematous disease. Postsurgical clips in the left hilar region. Linear scarring in the left mid lung. No acute airspace disease, pleural effusion, or pneumothorax. IMPRESSION: No active cardiopulmonary disease. Emphysematous disease with scarring in the left mid lung. Electronically Signed   By: Jasmine PangKim  Fujinaga [name]   On: 02/29/2020  17:39   DG Knee 1-2 Views Right  Result Date: 03/01/2020 CLINICAL DATA:  Right hip fracture EXAM: RIGHT KNEE - 1-2 VIEW COMPARISON:  None. FINDINGS: Normal alignment no fracture.  Joint spaces normal. Surgical clips in the posterior soft tissues. Arterial calcification IMPRESSION: Normal knee. Atherosclerotic disease. Electronically Signed   By: Marlan Palauharles  Clark [name]   On: 03/01/2020 09:01   DG Pelvis Portable  Result Date: 03/01/2020 CLINICAL DATA:  80 year old male status post right hip arthroplasty. EXAM: PORTABLE PELVIS 1-2 VIEWS COMPARISON:  Pelvic radiograph dated 02/29/2020. FINDINGS: Total right hip arthroplasty. The arthroplasty components appear intact and in anatomic alignment. There is no acute fracture or dislocation. The bones are osteopenic. Vascular calcifications noted. Cutaneous clips over the right hip. IMPRESSION: Total right hip arthroplasty. Electronically Signed   By: Elgie CollardArash  Radparvar [name]   On: 03/01/2020 19:53   DG C-Arm 1-60 Min  Result Date: 03/01/2020 CLINICAL DATA:  Right femoral neck fracture EXAM: OPERATIVE RIGHT HIP WITH PELVIS; DG C-ARM 1-60 MIN COMPARISON:  Plain film from previous day. FLUOROSCOPY TIME:  Radiation Exposure Index (as provided by the fluoroscopic device): 0.62 mGy If the device does not provide the exposure index: Fluoroscopy Time:  6 seconds Number of Acquired Images:  6 FINDINGS: Initial images demonstrate removal of the femoral neck and femoral head. Acetabular component is noted in place. Subsequent sizing device was placed. Femoral component was then placed for completion and well seated within the acetabular component. IMPRESSION: Status post right hip replacement. Electronically Signed   By: Alcide CleverMark  Lukens [name]   On: 03/01/2020 17:03   DG HIP OPERATIVE UNILAT W OR W/O PELVIS RIGHT  Result Date: 03/01/2020 CLINICAL DATA:  Right femoral neck fracture EXAM: OPERATIVE RIGHT HIP WITH PELVIS; DG C-ARM 1-60 MIN COMPARISON:  Plain film from previous day.  FLUOROSCOPY TIME:  Radiation Exposure Index (as provided by the fluoroscopic device): 0.62 mGy If the device does not provide the exposure index: Fluoroscopy Time:  6 seconds Number of Acquired Images:  6 FINDINGS: Initial images demonstrate removal of the femoral neck and femoral head. Acetabular component is noted in place. Subsequent sizing device was placed. Femoral component was then placed for completion and well seated within the acetabular component. IMPRESSION: Status post right hip replacement. Electronically Signed   By: Alcide CleverMark  Lukens [name]   On: 03/01/2020 17:03   DG Hip Unilat W or Wo Pelvis 2-3 Views Right  Result Date: 02/29/2020 CLINICAL DATA:  Pain status post fall EXAM: DG HIP (WITH OR WITHOUT PELVIS) 2-3V RIGHT COMPARISON:  None. FINDINGS: There is an acute, displaced, transcervical fracture of the proximal right femur. There is no dislocation. There are degenerative changes of both hips. There are vascular calcifications. There is osteopenia. IMPRESSION: 1. Acute, displaced, transcervical fracture of the proximal right femur. 2. Degenerative changes of both hips. 3. Osteopenia. Electronically Signed   By: Katherine Mantlehristopher  Green [name]   On: 02/29/2020 17:36      Discharge Exam: Vitals:   03/06/20 2006 03/07/20 0759  BP: (!) 152/62 131/60  Pulse: 89 66  Resp: 17 16  Temp: 97.7 F (36.5 C) 98.1 F (36.7 C)  SpO2: 92% 94%   Vitals:   03/06/20 1445 03/06/20 1447 03/06/20 2006 03/07/20 0759  BP: 114/67 114/67 (!) 152/62 131/60  Pulse: 69 69 89 66  Resp: 17 17 17 16   Temp: 97.7 F (36.5 C) 97.7 F (36.5 C) 97.7 F (36.5 C) 98.1 F (36.7 C)  TempSrc: Oral Oral Oral Oral  SpO2: 99% 99% 92% 94%  Weight:      Height:        General: Pt is alert, awake, not in acute distress Cardiovascular: RRR, S1/S2 +, no rubs, no gallops Respiratory: CTA bilaterally, no wheezing, no rhonchi Abdominal: Soft, NT, ND, bowel sounds + Extremities: no edema, no cyanosis    The results of  significant diagnostics from this hospitalization (including imaging, microbiology, ancillary and laboratory) are listed below for reference.     Microbiology: Recent Results (from the past 240 hour(s))  Resp Panel by RT-PCR (Flu A&B, Covid) Nasopharyngeal Swab     Status: None   Collection Time: 02/29/20  6:24 PM   Specimen: Nasopharyngeal Swab; Nasopharyngeal(NP) swabs in vial transport medium  Result Value Ref Range Status   SARS Coronavirus 2 by RT PCR NEGATIVE NEGATIVE Final    Comment: (NOTE) SARS-CoV-2 target nucleic acids are NOT DETECTED.  The SARS-CoV-2 RNA is  generally detectable in upper respiratory specimens during the acute phase of infection. The lowest concentration of SARS-CoV-2 viral copies this assay can detect is 138 copies/mL. A negative result does not preclude SARS-Cov-2 infection and should not be used as the sole basis for treatment or other patient management decisions. A negative result may occur with  improper specimen collection/handling, submission of specimen other than nasopharyngeal swab, presence of viral mutation(s) within the areas targeted by this assay, and inadequate number of viral copies(<138 copies/mL). A negative result must be combined with clinical observations, patient history, and epidemiological information. The expected result is Negative.  Fact Sheet for Patients:  BloggerCourse.com  Fact Sheet for Healthcare Providers:  SeriousBroker.it  This test is no t yet approved or cleared by the [place] FDA and  has been authorized for detection and/or diagnosis of SARS-CoV-2 by FDA under an Emergency Use Authorization (EUA). This EUA will remain  in effect (meaning this test can be used) for the duration of the COVID-19 declaration under Section 564(b)(1) of the Act, 21 U.S.C.section 360bbb-3(b)(1), unless the authorization is terminated  or revoked sooner.       Influenza A by  PCR NEGATIVE NEGATIVE Final   Influenza B by PCR NEGATIVE NEGATIVE Final    Comment: (NOTE) The Xpert Xpress SARS-CoV-2/FLU/RSV plus assay is intended as an aid in the diagnosis of influenza from Nasopharyngeal swab specimens and should not be used as a sole basis for treatment. Nasal washings and aspirates are unacceptable for Xpert Xpress SARS-CoV-2/FLU/RSV testing.  Fact Sheet for Patients: BloggerCourse.com  Fact Sheet for Healthcare Providers: SeriousBroker.it  This test is not yet approved or cleared by the [place] FDA and has been authorized for detection and/or diagnosis of SARS-CoV-2 by FDA under an Emergency Use Authorization (EUA). This EUA will remain in effect (meaning this test can be used) for the duration of the COVID-19 declaration under Section 564(b)(1) of the Act, 21 U.S.C. section 360bbb-3(b)(1), unless the authorization is terminated or revoked.  Performed at [facility], [address], [place], [place] [address]   Surgical pcr screen     Status: None   Collection Time: 03/01/20  1:16 AM   Specimen: Nasal Mucosa; Nasal Swab  Result Value Ref Range Status   MRSA, PCR NEGATIVE NEGATIVE Final   Staphylococcus aureus NEGATIVE NEGATIVE Final    Comment: (NOTE) The Xpert SA Assay (FDA approved for NASAL specimens in patients 97 years of age and older), is one component of a comprehensive surveillance program. It is not intended to diagnose infection nor to guide or monitor treatment. Performed at [facility], [address], [place], [place] [address]      Labs: BNP (last 3 results) No results for input(s): BNP in the last 8760 hours. Basic Metabolic Panel: Recent Labs  Lab 03/02/20 0212 03/03/20 0448 03/04/20 0801 03/05/20 1306 03/06/20 1213  NA 130* 128* 130* 129* 128*  K 4.7 4.6 4.7 4.9 4.6  CL 101 99 102 99 96*  CO2 18* 21* 20* 21* 20*  GLUCOSE 244* 163* 147* 228* 173*   BUN 24* 41* 31* 28* 21  CREATININE 1.75* 2.09* 1.50* 1.21 1.07  CALCIUM 8.1* 8.2* 8.5* 8.1* 8.5*   Liver Function Tests: No results for input(s): AST, ALT, ALKPHOS, BILITOT, PROT, ALBUMIN in the last 168 hours. No results for input(s): LIPASE, AMYLASE in the last 168 hours. No results for input(s): AMMONIA in the last 168 hours. CBC: Recent Labs  Lab 02/29/20 1720  03/01/20 1907 03/02/20 0212 03/02/20 1233 03/02/20 2159 03/03/20 0448 03/04/20 0801 03/06/20 1213  WBC 9.3 17.4* 11.8*  --  9.1 8.1 7.8 8.4  NEUTROABS 7.8* 15.4*  --   --   --   --  5.1 6.0  HGB 10.5* 9.0* 7.6* 6.6* 7.9* 7.7* 7.7* 8.2*  HCT 33.3* 28.8* 23.2* 20.2* 23.9* 23.1* 23.2* 25.6*  MCV 93.3 94.4 91.7  --  90.5 90.2 90.3 93.1  PLT 327 388 314  --  273 290 328 400   Cardiac Enzymes: No results for input(s): CKTOTAL, CKMB, CKMBINDEX, TROPONINI in the last 168 hours. BNP: Invalid input(s): POCBNP CBG: Recent Labs  Lab 03/05/20 1651 03/06/20 1125 03/06/20 1647 03/06/20 2046 03/07/20 0655  GLUCAP 136* 163* 169* 271* 135*   D-Dimer No results for input(s): DDIMER in the last 72 hours. Hgb A1c No results for input(s): HGBA1C in the last 72 hours. Lipid Profile No results for input(s): CHOL, HDL, LDLCALC, TRIG, CHOLHDL, LDLDIRECT in the last 72 hours. Thyroid function studies No results for input(s): TSH, T4TOTAL, T3FREE, THYROIDAB in the last 72 hours.  Invalid input(s): FREET3 Anemia work up No results for input(s): VITAMINB12, FOLATE, FERRITIN, TIBC, IRON, RETICCTPCT in the last 72 hours. Urinalysis    Component Value Date/Time   COLORURINE YELLOW 07/20/2015 2230   APPEARANCEUR CLEAR 07/20/2015 2230   LABSPEC 1.012 07/20/2015 2230   PHURINE 5.0 07/20/2015 2230   GLUCOSEU NEGATIVE 07/20/2015 2230   HGBUR NEGATIVE 07/20/2015 2230   BILIRUBINUR NEGATIVE 07/20/2015 2230   KETONESUR NEGATIVE 07/20/2015 2230   PROTEINUR NEGATIVE 07/20/2015 2230   UROBILINOGEN 1.0 07/13/2012 1445   NITRITE  NEGATIVE 07/20/2015 2230   LEUKOCYTESUR NEGATIVE 07/20/2015 2230   Sepsis Labs Invalid input(s): PROCALCITONIN,  WBC,  LACTICIDVEN Microbiology Recent Results (from the past 240 hour(s))  Resp Panel by RT-PCR (Flu A&B, Covid) Nasopharyngeal Swab     Status: None   Collection Time: 02/29/20  6:24 PM   Specimen: Nasopharyngeal Swab; Nasopharyngeal(NP) swabs in vial transport medium  Result Value Ref Range Status   SARS Coronavirus 2 by RT PCR NEGATIVE NEGATIVE Final    Comment: (NOTE) SARS-CoV-2 target nucleic acids are NOT DETECTED.  The SARS-CoV-2 RNA is generally detectable in upper respiratory specimens during the acute phase of infection. The lowest concentration of SARS-CoV-2 viral copies this assay can detect is 138 copies/mL. A negative result does not preclude SARS-Cov-2 infection and should not be used as the sole basis for treatment or other patient management decisions. A negative result may occur with  improper specimen collection/handling, submission of specimen other than nasopharyngeal swab, presence of viral mutation(s) within the areas targeted by this assay, and inadequate number of viral copies(<138 copies/mL). A negative result must be combined with clinical observations, patient history, and epidemiological information. The expected result is Negative.  Fact Sheet for Patients:  BloggerCourse.com  Fact Sheet for Healthcare Providers:  SeriousBroker.it  This test is no t yet approved or cleared by the [place] FDA and  has been authorized for detection and/or diagnosis of SARS-CoV-2 by FDA under an Emergency Use Authorization (EUA). This EUA will remain  in effect (meaning this test can be used) for the duration of the COVID-19 declaration under Section 564(b)(1) of the Act, 21 U.S.C.section 360bbb-3(b)(1), unless the authorization is terminated  or revoked sooner.       Influenza A by PCR NEGATIVE  NEGATIVE Final   Influenza B by PCR NEGATIVE NEGATIVE Final    Comment: (NOTE) The Xpert  Xpress SARS-CoV-2/FLU/RSV plus assay is intended as an aid in the diagnosis of influenza from Nasopharyngeal swab specimens and should not be used as a sole basis for treatment. Nasal washings and aspirates are unacceptable for Xpert Xpress SARS-CoV-2/FLU/RSV testing.  Fact Sheet for Patients: BloggerCourse.com  Fact Sheet for Healthcare Providers: SeriousBroker.it  This test is not yet approved or cleared by the [place] FDA and has been authorized for detection and/or diagnosis of SARS-CoV-2 by FDA under an Emergency Use Authorization (EUA). This EUA will remain in effect (meaning this test can be used) for the duration of the COVID-19 declaration under Section 564(b)(1) of the Act, 21 U.S.C. section 360bbb-3(b)(1), unless the authorization is terminated or revoked.  Performed at [facility], [address] [place], [place] [address]   Surgical pcr screen     Status: None   Collection Time: 03/01/20  1:16 AM   Specimen: Nasal Mucosa; Nasal Swab  Result Value Ref Range Status   MRSA, PCR NEGATIVE NEGATIVE Final   Staphylococcus aureus NEGATIVE NEGATIVE Final    Comment: (NOTE) The Xpert SA Assay (FDA approved for NASAL specimens in patients 37 years of age and older), is one component of a comprehensive surveillance program. It is not intended to diagnose infection nor to guide or monitor treatment. Performed at [facility], [address] [place], [place] [address]      Time coordinating discharge: Over 30 minutes  SIGNED:   Hughie Closs, MD  [facility] 03/07/2020, 9:51 AM  If 7PM-7AM, please contact night-coverage [email or web address]

## 2020-03-15 ENCOUNTER — Emergency Department (HOSPITAL_COMMUNITY)
Admission: EM | Admit: 2020-03-15 | Discharge: 2020-03-16 | Disposition: A | Discharge status: Home / Self Care | Payer: Medicare (Managed Care) | Attending: Emergency Medicine | Admitting: Emergency Medicine

## 2020-03-15 ENCOUNTER — Emergency Department (HOSPITAL_COMMUNITY): Payer: Medicare (Managed Care)

## 2020-03-15 ENCOUNTER — Encounter (HOSPITAL_COMMUNITY): Payer: Self-pay

## 2020-03-15 DIAGNOSIS — Z7982 Long term (current) use of aspirin: Secondary | ICD-10-CM | POA: Diagnosis not present

## 2020-03-15 DIAGNOSIS — Z794 Long term (current) use of insulin: Secondary | ICD-10-CM | POA: Diagnosis not present

## 2020-03-15 DIAGNOSIS — Z87891 Personal history of nicotine dependence: Secondary | ICD-10-CM | POA: Insufficient documentation

## 2020-03-15 DIAGNOSIS — I2510 Atherosclerotic heart disease of native coronary artery without angina pectoris: Secondary | ICD-10-CM | POA: Insufficient documentation

## 2020-03-15 DIAGNOSIS — J449 Chronic obstructive pulmonary disease, unspecified: Secondary | ICD-10-CM | POA: Diagnosis not present

## 2020-03-15 DIAGNOSIS — I6782 Cerebral ischemia: Secondary | ICD-10-CM | POA: Insufficient documentation

## 2020-03-15 DIAGNOSIS — E871 Hypo-osmolality and hyponatremia: Secondary | ICD-10-CM | POA: Diagnosis not present

## 2020-03-15 DIAGNOSIS — Z951 Presence of aortocoronary bypass graft: Secondary | ICD-10-CM | POA: Diagnosis not present

## 2020-03-15 DIAGNOSIS — I252 Old myocardial infarction: Secondary | ICD-10-CM | POA: Insufficient documentation

## 2020-03-15 DIAGNOSIS — Z7902 Long term (current) use of antithrombotics/antiplatelets: Secondary | ICD-10-CM | POA: Diagnosis not present

## 2020-03-15 DIAGNOSIS — Z96641 Presence of right artificial hip joint: Secondary | ICD-10-CM | POA: Insufficient documentation

## 2020-03-15 DIAGNOSIS — Z7984 Long term (current) use of oral hypoglycemic drugs: Secondary | ICD-10-CM | POA: Insufficient documentation

## 2020-03-15 DIAGNOSIS — Z20822 Contact with and (suspected) exposure to covid-19: Secondary | ICD-10-CM | POA: Insufficient documentation

## 2020-03-15 DIAGNOSIS — Z79899 Other long term (current) drug therapy: Secondary | ICD-10-CM | POA: Insufficient documentation

## 2020-03-15 DIAGNOSIS — I10 Essential (primary) hypertension: Secondary | ICD-10-CM | POA: Diagnosis not present

## 2020-03-15 DIAGNOSIS — E1140 Type 2 diabetes mellitus with diabetic neuropathy, unspecified: Secondary | ICD-10-CM | POA: Diagnosis not present

## 2020-03-15 LAB — CBC WITH DIFFERENTIAL/PLATELET
Abs Immature Granulocytes: 0.05 10*3/uL (ref 0.00–0.07)
Basophils Absolute: 0 10*3/uL (ref 0.0–0.1)
Basophils Relative: 0 %
Eosinophils Absolute: 0.2 10*3/uL (ref 0.0–0.5)
Eosinophils Relative: 1 %
HCT: 31.2 % — ABNORMAL LOW (ref 39.0–52.0)
Hemoglobin: 10 g/dL — ABNORMAL LOW (ref 13.0–17.0)
Immature Granulocytes: 0 %
Lymphocytes Relative: 8 %
Lymphs Abs: 1 10*3/uL (ref 0.7–4.0)
MCH: 30.1 pg (ref 26.0–34.0)
MCHC: 32.1 g/dL (ref 30.0–36.0)
MCV: 94 fL (ref 80.0–100.0)
Monocytes Absolute: 0.9 10*3/uL (ref 0.1–1.0)
Monocytes Relative: 8 %
Neutro Abs: 9.6 10*3/uL — ABNORMAL HIGH (ref 1.7–7.7)
Neutrophils Relative %: 83 %
Platelets: 590 10*3/uL — ABNORMAL HIGH (ref 150–400)
RBC: 3.32 MIL/uL — ABNORMAL LOW (ref 4.22–5.81)
RDW: 15.3 % (ref 11.5–15.5)
WBC: 11.7 10*3/uL — ABNORMAL HIGH (ref 4.0–10.5)
nRBC: 0 % (ref 0.0–0.2)

## 2020-03-15 LAB — BASIC METABOLIC PANEL
Anion gap: 8 (ref 5–15)
BUN: 26 mg/dL — ABNORMAL HIGH (ref 8–23)
CO2: 23 mmol/L (ref 22–32)
Calcium: 8.7 mg/dL — ABNORMAL LOW (ref 8.9–10.3)
Chloride: 96 mmol/L — ABNORMAL LOW (ref 98–111)
Creatinine, Ser: 1.03 mg/dL (ref 0.61–1.24)
GFR, Estimated: >60 mL/min (ref >60)
Glucose, Bld: 137 mg/dL — ABNORMAL HIGH (ref 70–99)
Potassium: 4.5 mmol/L (ref 3.5–5.1)
Sodium: 127 mmol/L — ABNORMAL LOW (ref 135–145)

## 2020-03-15 MED ORDER — SODIUM CHLORIDE 0.9 % IV BOLUS
500.0000 mL | Freq: Once | INTRAVENOUS | Status: AC
  Administered 2020-03-15: 500 mL via INTRAVENOUS

## 2020-03-15 MED ORDER — OXYCODONE-ACETAMINOPHEN 5-325 MG PO TABS
1.0000 | ORAL_TABLET | Freq: Once | ORAL | Status: AC
  Administered 2020-03-15: 1 via ORAL
  Filled 2020-03-15: qty 1

## 2020-03-15 NOTE — ED Notes (Signed)
POA Venia Carbon [phone]. Please contact if discharged or admission.

## 2020-03-15 NOTE — ED Provider Notes (Signed)
Signout note  80 year old male with hyponatremia. Recent hip fracture, hyponatremic in hospital. Nursing facility found patient to have sodium of 126 and sent to ER for further eval. No associated symptoms based on the initial history. Dr. Fredderick Phenix stated nurse practitioner came to place concerned about possible slurred speech and increased confusion although patient denies this. Repeat sodium is 127. At time of signout, plan to check CT head.  CT head negative for acute pathology but did show age indeterminant lacunar infarcts in the left pons. MRI was obtained and was negative for any acute stroke. Reassessed patient, he remains well-appearing, stable, no acute complaints. Resting comfortably with stable vitals. Believe he is appropriate for discharge back to his rehab facility.   Milagros Loll, MD 03/15/20 [phone]

## 2020-03-15 NOTE — ED Triage Notes (Signed)
Pt BIB EMS. Pt is coming in due to abnormal sodium level at126. Pt is coming from [place] rehab facility due to a right femur fx.  VS WNL

## 2020-03-15 NOTE — ED Provider Notes (Signed)
[facility]-EMERGENCY DEPT Provider Note   CSN: [id number] Arrival date & time: 03/15/20  1153     History Chief Complaint  Patient presents with  . abnormal labs     WESTIN KNOTTS is a 80 y.o. male.  Patient is a 80 year old male who presents with hyponatremia.  He has a history of a recent hip fracture that was repaired in the operating room.  He is currently at [facility].  He was noted to have some mild hyponatremia during his recent hospitalization.  On blood work, came in place found him to have a low sodium at 126 and he was sent here for further evaluation.  He says he overall feels okay.  He has some pain related to his hip fracture but its not any different than it has been.  He denies any increased weakness.  No vomiting or diarrhea.  No fevers.  No cough or cold symptoms.        Past Medical History:  Diagnosis Date  . Anemia   . Carotid stenosis   . Coronary artery disease   . Depression    bc of foot pain but no meds required  . Diabetes mellitus    takes Glipizide and Metformin daily;Lantus nightly  . Enlarged heart   . Hyperlipidemia   . Myocardial infarction (HCC) 2013  . Pneumonia 2013   hx of  . Skin spots-aging   . Weakness of right leg     Patient Active Problem List   Diagnosis Date Noted  . Hip fracture (HCC) 02/29/2020  . Closed right hip fracture, initial encounter (HCC) 02/29/2020  . Anxiety 12/08/2017  . Hyperlipidemia 12/08/2017  . Malnutrition of moderate degree 10/18/2017  . Gangrene of toe of right foot (HCC) 10/16/2017  . Cellulitis of great toe of right foot 10/16/2017  . Osteomyelitis of great toe of right foot (HCC) 10/16/2017  . Uncontrolled type 2 diabetes mellitus with peripheral neuropathy (HCC) 06/10/2016  . Skin lesion 11/30/2015  . Anemia 09/15/2015  . Right foot ulcer (HCC) 09/15/2015  . Renal insufficiency 09/15/2015  . Syncope 07/20/2015  . Aftercare following surgery of the circulatory  system, NEC 09/09/2012  . Carotid artery stenosis 09/09/2012  . Peripheral vascular disease, unspecified (HCC) 07/29/2012  . Atherosclerosis of native arteries of the extremities with ulceration (HCC) 07/08/2012  . Type II or unspecified type diabetes mellitus with peripheral circulatory disorders, uncontrolled(250.72) 07/08/2012  . Diabetic foot ulcer (HCC) 06/26/2012  . Occlusion and stenosis of carotid artery without mention of cerebral infarction 07/24/2011  . Hypertension   . COPD (chronic obstructive pulmonary disease) (HCC)   . Anemia due to blood loss, acute   . S/P CABG (coronary artery bypass graft) 07/01/2011  . Carotid artery disease (HCC) 07/01/2011  . Disorder of arteries and arterioles (HCC) 07/01/2011  . Depression 06/07/2011  . Dyslipidemia   . CAD (coronary artery disease) 04/24/2011  . Insulin dependent type 2 diabetes mellitus, controlled (HCC)     Past Surgical History:  Procedure Laterality Date  . ABDOMINAL AORTAGRAM    . ABDOMINAL AORTAGRAM N/A 07/09/2012   Procedure: ABDOMINAL Ronny Flurry;  Surgeon: Sherren Kerns, MD;  Location: [facility] CATH LAB;  Service: Cardiovascular;  Laterality: N/A;  . ABDOMINAL AORTOGRAM W/LOWER EXTREMITY N/A 10/19/2017   Procedure: ABDOMINAL AORTOGRAM W/LOWER EXTREMITY;  Surgeon: Cephus Shelling, MD;  Location: [facility] INVASIVE CV LAB;  Service: Cardiovascular;  Laterality: N/A;  . AMPUTATION Right 07/14/2012   Procedure: AMPUTATION DIGIT;  Surgeon: Sherren Kernsharles E Fields, MD;  [facility];  Service: Vascular;  Laterality: Right;  Amputation Right Fourth Toe  . AMPUTATION Right 10/20/2017   Procedure: AMPUTATION DIGIT;  Surgeon: Maeola Harmanain, Brandon Christopher, MD;  [facility];  Service: Vascular;  Laterality: Right;  . CARDIAC CATHETERIZATION  2013  . CAROTID ENDARTERECTOMY  07/01/11   Right CEA  . COLONOSCOPY    . CORONARY ARTERY BYPASS GRAFT  07/01/2011   Procedure: CORONARY ARTERY BYPASS GRAFTING (CABG);  Surgeon: Loreli SlotSteven C Hendrickson, MD;   [facility];  Service: Open Heart Surgery;  Laterality: N/A;  . ENDARTERECTOMY  07/01/2011   Procedure: ENDARTERECTOMY CAROTID;  Surgeon: Sherren Kernsharles E Fields, MD;  [facility];  Service: Vascular;  Laterality: Right;  . FEMORAL-POPLITEAL BYPASS GRAFT Right 07/14/2012   Procedure: BYPASS GRAFT FEMORAL-POPLITEAL ARTERY;  Surgeon: Sherren Kernsharles E Fields, MD;  [facility];  Service: Vascular;  Laterality: Right;  Right Femoral Popliteal Bypass with Left Leg Nonreversed Greater Saphenous Vein  . INTRAOPERATIVE ARTERIOGRAM Right 07/14/2012   Procedure: INTRA OPERATIVE ARTERIOGRAM;  Surgeon: Sherren Kernsharles E Fields, MD;  [facility];  Service: Vascular;  Laterality: Right;  . LEFT HEART CATHETERIZATION WITH CORONARY ANGIOGRAM N/A 04/24/2011   Procedure: LEFT HEART CATHETERIZATION WITH CORONARY ANGIOGRAM;  Surgeon: Othella BoyerWilliam S Tilley, MD;  [facility];  Service: Cardiovascular;  Laterality: N/A;  . PERIPHERAL VASCULAR BALLOON ANGIOPLASTY Right 10/19/2017   Procedure: PERIPHERAL VASCULAR BALLOON ANGIOPLASTY;  Surgeon: Cephus Shellinglark, Christopher J, MD;  Location: [facility] INVASIVE CV LAB;  Service: Cardiovascular;  Laterality: Right;  peroneal  . PERIPHERAL VASCULAR INTERVENTION Right 10/19/2017   Procedure: PERIPHERAL VASCULAR INTERVENTION;  Surgeon: Cephus Shellinglark, Christopher J, MD;  Location: [facility] INVASIVE CV LAB;  Service: Cardiovascular;  Laterality: Right;  common iliac  . TOTAL HIP ARTHROPLASTY Right 03/01/2020   Procedure: RIGHT TOTAL HIP ARTHROPLASTY ANTERIOR APPROACH;  Surgeon: Samson FredericSwinteck, Brian, MD;  Location: [facility] OR;  Service: Orthopedics;  Laterality: Right;       Family History  Problem Relation Age of Onset  . Heart disease Father   . Cancer Brother   . Diabetes Brother   . Diabetes Daughter   . Anesthesia problems Neg Hx   . Hypotension Neg Hx   . Malignant hyperthermia Neg Hx   . Pseudochol deficiency Neg Hx     Social History   Tobacco Use  . Smoking status: Former Smoker    Packs/day: 1.00    Years:  20.00    Pack years: 20.00    Types: Cigarettes    Quit date: 02/24/1978    Years since quitting: 42.0  . Smokeless tobacco: Never Used  Vaping Use  . Vaping Use: Never used  Substance Use Topics  . Alcohol use: No  . Drug use: No    Home Medications Prior to Admission medications   Medication Sig Start Date End Date Taking? Authorizing Provider  aspirin 81 MG chewable tablet Chew 1 tablet (81 mg total) by mouth 2 (two) times daily with a meal. Patient taking differently: Chew 81 mg by mouth in the morning and at bedtime. 03/02/20 04/16/20 Yes Darrick GrinderMcCauley, Larry B, PA  clopidogrel (PLAVIX) 75 MG tablet Take 1 tablet by mouth once daily Patient taking differently: Take 75 mg by mouth daily. 11/08/19  Yes Maeola Harmanain, Brandon Christopher, MD  Dextromethorphan-Benzocaine (CEPACOL SORE THROAT & COUGH) 5-7.5 MG LOZG Take 1 lozenge by mouth 2 (two) times daily.   Yes [provider]  docusate sodium (COLACE) 100 MG capsule Take 100  mg by mouth daily.   Yes [provider]  fenofibrate 160 MG tablet Take 160 mg by mouth daily. 06/23/15  Yes [provider]  ferrous gluconate (FERGON) 324 MG tablet Take 324 mg by mouth daily.   Yes [provider]  glipiZIDE (GLUCOTROL XL) 10 MG 24 hr tablet Take 10 mg by mouth daily.   Yes [provider]  HYDROcodone-acetaminophen (NORCO) 7.5-325 MG tablet Take 1-2 tablets by mouth every 6 (six) hours as needed for severe pain (pain score 7-10). 03/02/20   Barrie Dunker B, PA  magnesium oxide (MAG-OX) 400 (241.3 Mg) MG tablet Take 1 tablet (400 mg total) by mouth 2 (two) times daily. 07/21/15   Dorothea Ogle, MD  metFORMIN (GLUCOPHAGE) 1000 MG tablet TAKE ONE TABLET BY MOUTH TWICE DAILY FOR  DIABETES Patient taking differently: Take 1,000 mg by mouth 2 (two) times daily with a meal. 10/21/12   Reed, Tiffany L, DO  metoprolol tartrate (LOPRESSOR) 25 MG tablet Take 12.5 mg by mouth 2 (two) times daily. 03/06/15   [provider]  simvastatin (ZOCOR) 20 MG tablet Take 20 mg by mouth daily. 02/20/20   [provider]  simvastatin (ZOCOR) 40 MG tablet Take 1 tablet (40 mg total) by mouth at bedtime. Patient not taking: No sig reported 10/23/17   Burnadette Pop, MD    Allergies    Patient has no known allergies.  Review of Systems   Review of Systems  Constitutional: Positive for fatigue (At baseline). Negative for chills, diaphoresis and fever.  HENT: Negative for congestion, rhinorrhea and sneezing.   Eyes: Negative.   Respiratory: Negative for cough, chest tightness and shortness of breath.   Cardiovascular: Negative for chest pain and leg swelling.  Gastrointestinal: Negative for abdominal pain, blood in stool, diarrhea, nausea and vomiting.  Genitourinary: Negative for difficulty urinating, flank pain, frequency and hematuria.  Musculoskeletal: Positive for arthralgias. Negative for back pain.  Skin: Positive for wound (He has some wounds to his upper arms that are dressed). Negative for rash.  Neurological: Negative for dizziness, speech difficulty, weakness, numbness and headaches.    Physical Exam Updated Vital Signs BP (!) 136/51   Pulse 60   Temp 97.6 F (36.4 C) (Oral)   Resp 18   SpO2 98%   Physical Exam Constitutional:      Appearance: He is well-developed and well-nourished.  HENT:     Head: Normocephalic and atraumatic.  Eyes:     Pupils: Pupils are equal, round, and reactive to light.  Cardiovascular:     Rate and Rhythm: Normal rate and regular rhythm.     Heart sounds: Normal heart sounds.  Pulmonary:     Effort: Pulmonary effort is normal. No respiratory distress.     Breath sounds: Normal breath sounds. No wheezing or rales.  Chest:     Chest wall: No tenderness.  Abdominal:     General: Bowel sounds are normal.     Palpations: Abdomen is soft.     Tenderness: There is no abdominal tenderness. There is no guarding or rebound.  Musculoskeletal:        General: No  edema. Normal range of motion.     Cervical back: Normal range of motion and neck supple.     Comments: Wounds to bilateral upper extremities that have dressings in place ending signs of infection is noted.  He has a healing incision to his right hip.  No dehiscence or signs of infection.  No  drainage.  Lymphadenopathy:     Cervical: No cervical adenopathy.  Skin:    General: Skin is warm and dry.     Findings: No rash.  Neurological:     Mental Status: He is alert and oriented to person, place, and time.  Psychiatric:        Mood and Affect: Mood and affect normal.     ED Results / Procedures / Treatments   Labs (all labs ordered are listed, but only abnormal results are displayed) Labs Reviewed  BASIC METABOLIC PANEL - Abnormal; Notable for the following components:      Result Value   Sodium 127 (*)    Chloride 96 (*)    Glucose, Bld 137 (*)    BUN 26 (*)    Calcium 8.7 (*)    All other components within normal limits  CBC WITH DIFFERENTIAL/PLATELET - Abnormal; Notable for the following components:   WBC 11.7 (*)    RBC 3.32 (*)    Hemoglobin 10.0 (*)    HCT 31.2 (*)    Platelets 590 (*)    Neutro Abs 9.6 (*)    All other components within normal limits    EKG None  Radiology No results found.  Procedures Procedures (including critical care time)  Medications Ordered in ED Medications  sodium chloride 0.9 % bolus 500 mL (has no administration in time range)    ED Course  I have reviewed the triage vital signs and the nursing notes.  Pertinent labs & imaging results that were available during my care of the patient were reviewed by me and considered in my medical decision making (see chart for details).    MDM Rules/Calculators/A&P                          Patient is a 80 year old male here to presents with hyponatremia.  On chart review, he had hyponatremia during his most recent hospitalization.  His sodium ranged between 1 28-1 30.  On the day of  discharge, it was 128.  Today it is 127.  I spoke to the NP at [facility] who advised that the patient also had had some slurred speech and increased confusion as well as nausea today.  I went back and examined the patient.  He does not have any slurred speech on my exam.  He says that he always has nausea and that is unchanged from his baseline.  He has good motor strength in all extremities except he has some limited strength in his right lower extremity is where he had his hip fracture.  I do not appreciate any sensory deficit on exam.  He does not have any teeth in place so it is difficult to ascertain if he has any true facial drooping.  Will obtain a head CT.  If negative and patient currently is asymptomatic, can likely be discharged back to nursing facility.  The nurse practitioner did advise that they can follow-up on his sodium and recheck it at the nursing facility.  Dr. Stevie Kern to take over care pending CT. Final Clinical Impression(s) / ED Diagnoses Final diagnoses:  Hyponatremia    Rx / DC Orders ED Discharge Orders    None       Rolan Bucco, MD 03/15/20 1525

## 2020-03-15 NOTE — ED Notes (Signed)
[facility] called for transport back to [facility]

## 2020-03-15 NOTE — ED Notes (Addendum)
Per RN, pt was provided with sandwich and beverage and placed in semi-Fowler position per his request. No coughing or choking were observed when eating or drinking. Staff requested to change pt's brief and perform peri care however pt refused, stating "I've only peed a little bit, I don't need to be changed."

## 2020-03-15 NOTE — Discharge Instructions (Signed)
Please have your blood work rechecked, monitoring of your sodium levels.  Follow-up with your primary care doctor.  Return to ER for any new or concerning symptoms.

## 2020-03-15 NOTE — ED Notes (Signed)
Patient off the floor to MRI 

## 2020-03-16 ENCOUNTER — Emergency Department (HOSPITAL_COMMUNITY): Payer: Medicare (Managed Care)

## 2020-03-16 ENCOUNTER — Encounter (HOSPITAL_COMMUNITY): Payer: Self-pay

## 2020-03-16 ENCOUNTER — Other Ambulatory Visit: Payer: Self-pay

## 2020-03-16 ENCOUNTER — Emergency Department (HOSPITAL_COMMUNITY)
Admission: EM | Admit: 2020-03-16 | Discharge: 2020-03-17 | Disposition: A | Discharge status: Other Acute Inpatient Hospital | Payer: Medicare (Managed Care) | Source: Home / Self Care | Attending: Emergency Medicine | Admitting: Emergency Medicine

## 2020-03-16 DIAGNOSIS — Z96641 Presence of right artificial hip joint: Secondary | ICD-10-CM | POA: Insufficient documentation

## 2020-03-16 DIAGNOSIS — Z20822 Contact with and (suspected) exposure to covid-19: Secondary | ICD-10-CM | POA: Insufficient documentation

## 2020-03-16 DIAGNOSIS — J449 Chronic obstructive pulmonary disease, unspecified: Secondary | ICD-10-CM | POA: Insufficient documentation

## 2020-03-16 DIAGNOSIS — E1140 Type 2 diabetes mellitus with diabetic neuropathy, unspecified: Secondary | ICD-10-CM | POA: Insufficient documentation

## 2020-03-16 DIAGNOSIS — Z79899 Other long term (current) drug therapy: Secondary | ICD-10-CM | POA: Insufficient documentation

## 2020-03-16 DIAGNOSIS — Z951 Presence of aortocoronary bypass graft: Secondary | ICD-10-CM | POA: Insufficient documentation

## 2020-03-16 DIAGNOSIS — I10 Essential (primary) hypertension: Secondary | ICD-10-CM | POA: Insufficient documentation

## 2020-03-16 DIAGNOSIS — Z7982 Long term (current) use of aspirin: Secondary | ICD-10-CM | POA: Insufficient documentation

## 2020-03-16 DIAGNOSIS — I252 Old myocardial infarction: Secondary | ICD-10-CM | POA: Insufficient documentation

## 2020-03-16 DIAGNOSIS — R4689 Other symptoms and signs involving appearance and behavior: Secondary | ICD-10-CM

## 2020-03-16 DIAGNOSIS — I2510 Atherosclerotic heart disease of native coronary artery without angina pectoris: Secondary | ICD-10-CM | POA: Insufficient documentation

## 2020-03-16 DIAGNOSIS — Z87891 Personal history of nicotine dependence: Secondary | ICD-10-CM | POA: Insufficient documentation

## 2020-03-16 DIAGNOSIS — Z7984 Long term (current) use of oral hypoglycemic drugs: Secondary | ICD-10-CM | POA: Insufficient documentation

## 2020-03-16 LAB — BASIC METABOLIC PANEL
Anion gap: 13 (ref 5–15)
BUN: 19 mg/dL (ref 8–23)
CO2: 17 mmol/L — ABNORMAL LOW (ref 22–32)
Calcium: 8.2 mg/dL — ABNORMAL LOW (ref 8.9–10.3)
Chloride: 99 mmol/L (ref 98–111)
Creatinine, Ser: 0.85 mg/dL (ref 0.61–1.24)
GFR, Estimated: >60 mL/min (ref >60)
Glucose, Bld: 156 mg/dL — ABNORMAL HIGH (ref 70–99)
Potassium: 4.7 mmol/L (ref 3.5–5.1)
Sodium: 129 mmol/L — ABNORMAL LOW (ref 135–145)

## 2020-03-16 LAB — CBC WITH DIFFERENTIAL/PLATELET
Abs Immature Granulocytes: 0.04 10*3/uL (ref 0.00–0.07)
Basophils Absolute: 0 10*3/uL (ref 0.0–0.1)
Basophils Relative: 0 %
Eosinophils Absolute: 0.1 10*3/uL (ref 0.0–0.5)
Eosinophils Relative: 1 %
HCT: 26.9 % — ABNORMAL LOW (ref 39.0–52.0)
Hemoglobin: 8.6 g/dL — ABNORMAL LOW (ref 13.0–17.0)
Immature Granulocytes: 0 %
Lymphocytes Relative: 6 %
Lymphs Abs: 0.7 10*3/uL (ref 0.7–4.0)
MCH: 30.4 pg (ref 26.0–34.0)
MCHC: 32 g/dL (ref 30.0–36.0)
MCV: 95.1 fL (ref 80.0–100.0)
Monocytes Absolute: 0.9 10*3/uL (ref 0.1–1.0)
Monocytes Relative: 8 %
Neutro Abs: 8.6 10*3/uL — ABNORMAL HIGH (ref 1.7–7.7)
Neutrophils Relative %: 85 %
Platelets: 539 10*3/uL — ABNORMAL HIGH (ref 150–400)
RBC: 2.83 MIL/uL — ABNORMAL LOW (ref 4.22–5.81)
RDW: 15.7 % — ABNORMAL HIGH (ref 11.5–15.5)
WBC: 10.3 10*3/uL (ref 4.0–10.5)
nRBC: 0 % (ref 0.0–0.2)

## 2020-03-16 LAB — TROPONIN I (HIGH SENSITIVITY): Troponin I (High Sensitivity): 11 ng/L (ref <18)

## 2020-03-16 LAB — POC SARS CORONAVIRUS 2 AG -  ED: SARS Coronavirus 2 Ag: NEGATIVE

## 2020-03-16 MED ORDER — HYDROCODONE-ACETAMINOPHEN 5-325 MG PO TABS
1.0000 | ORAL_TABLET | Freq: Once | ORAL | Status: AC
  Administered 2020-03-16: 1 via ORAL
  Filled 2020-03-16: qty 1

## 2020-03-16 MED ORDER — HYDROCODONE-ACETAMINOPHEN 5-325 MG PO TABS
1.0000 | ORAL_TABLET | Freq: Once | ORAL | Status: AC
Start: 2020-03-16 — End: 2020-03-16
  Administered 2020-03-16: 1 via ORAL
  Filled 2020-03-16: qty 1

## 2020-03-16 NOTE — ED Notes (Signed)
MD called report to [facility].

## 2020-03-16 NOTE — ED Provider Notes (Signed)
[facility]-EMERGENCY DEPT Provider Note   CSN: [id number] Arrival date & time: 03/16/20  1226     History Chief Complaint  Patient presents with   Altered Mental Status    Daniel Tyler is a 80 y.o. male w/ hx of CAD s/p CABG, diabetes, HLD, recent right hip replacement, presenting to ED with report of confusion.  Per EMS report patient sent over for altered mental status.  No further information available on my assessment - no answer from [facility] staff when I attempted to call.  Patient seen yesterday with concern for confusion and slurred speech.  Per medical record review, yesterday in the ED the patient had Na of 127, stable on repeat, and chronically low, and had a CT and MRI of the brain which showed no acute infarct.    When asked why he is here, the patient states, "I have no idea.  I feel fine.  I'm trying to go home, and they keep sending me to the hospital."  He states he has no family for me to contact.  He denies headache, chest pain, SOB, fevers, chills, or dysuria.  He says his right hip is still "sore" but no worsening pain.  HPI     Past Medical History:  Diagnosis Date   Anemia    Carotid stenosis    Coronary artery disease    Depression    bc of foot pain but no meds required   Diabetes mellitus    takes Glipizide and Metformin daily;Lantus nightly   Enlarged heart    Hyperlipidemia    Myocardial infarction [facility]) 2013   Pneumonia 2013   hx of   Skin spots-aging    Weakness of right leg     Patient Active Problem List   Diagnosis Date Noted   Hip fracture (HCC) 02/29/2020   Closed right hip fracture, initial encounter (HCC) 02/29/2020   Anxiety 12/08/2017   Hyperlipidemia 12/08/2017   Malnutrition of moderate degree 10/18/2017   Gangrene of toe of right foot (HCC) 10/16/2017   Cellulitis of great toe of right foot 10/16/2017   Osteomyelitis of great toe of right foot (HCC) 10/16/2017    Uncontrolled type 2 diabetes mellitus with peripheral neuropathy (HCC) 06/10/2016   Skin lesion 11/30/2015   Anemia 09/15/2015   Right foot ulcer (HCC) 09/15/2015   Renal insufficiency 09/15/2015   Syncope 07/20/2015   Aftercare following surgery of the circulatory system, NEC 09/09/2012   Carotid artery stenosis 09/09/2012   Peripheral vascular disease, unspecified (HCC) 07/29/2012   Atherosclerosis of native arteries of the extremities with ulceration (HCC) 07/08/2012   Type II or unspecified type diabetes mellitus with peripheral circulatory disorders, uncontrolled(250.72) 07/08/2012   Diabetic foot ulcer (HCC) 06/26/2012   Occlusion and stenosis of carotid artery without mention of cerebral infarction 07/24/2011   Hypertension    COPD (chronic obstructive pulmonary disease) (HCC)    Anemia due to blood loss, acute    S/P CABG (coronary artery bypass graft) 07/01/2011   Carotid artery disease (HCC) 07/01/2011   Disorder of arteries and arterioles (HCC) 07/01/2011   Depression 06/07/2011   Dyslipidemia    CAD (coronary artery disease) 04/24/2011   Insulin dependent type 2 diabetes mellitus, controlled (HCC)     Past Surgical History:  Procedure Laterality Date   ABDOMINAL AORTAGRAM     ABDOMINAL AORTAGRAM N/A 07/09/2012   Procedure: ABDOMINAL Ronny Flurry;  Surgeon: Sherren Kerns, MD;  Location: [facility] CATH LAB;  Service:  Cardiovascular;  Laterality: N/A;   ABDOMINAL AORTOGRAM W/LOWER EXTREMITY N/A 10/19/2017   Procedure: ABDOMINAL AORTOGRAM W/LOWER EXTREMITY;  Surgeon: Cephus Shelling, MD;  Location: [facility] INVASIVE CV LAB;  Service: Cardiovascular;  Laterality: N/A;   AMPUTATION Right 07/14/2012   Procedure: AMPUTATION DIGIT;  Surgeon: Sherren Kerns, MD;  Location: [facility] OR;  Service: Vascular;  Laterality: Right;  Amputation Right Fourth Toe   AMPUTATION Right 10/20/2017   Procedure: AMPUTATION DIGIT;  Surgeon: Maeola Harman, MD;  Location: [facility]  OR;  Service: Vascular;  Laterality: Right;   CARDIAC CATHETERIZATION  2013   CAROTID ENDARTERECTOMY  07/01/11   Right CEA   COLONOSCOPY     CORONARY ARTERY BYPASS GRAFT  07/01/2011   Procedure: CORONARY ARTERY BYPASS GRAFTING (CABG);  Surgeon: Loreli Slot, MD;  Location: [facility] OR;  Service: Open Heart Surgery;  Laterality: N/A;   ENDARTERECTOMY  07/01/2011   Procedure: ENDARTERECTOMY CAROTID;  Surgeon: Sherren Kerns, MD;  Location: [facility] OR;  Service: Vascular;  Laterality: Right;   FEMORAL-POPLITEAL BYPASS GRAFT Right 07/14/2012   Procedure: BYPASS GRAFT FEMORAL-POPLITEAL ARTERY;  Surgeon: Sherren Kerns, MD;  Location: [facility] OR;  Service: Vascular;  Laterality: Right;  Right Femoral Popliteal Bypass with Left Leg Nonreversed Greater Saphenous Vein   INTRAOPERATIVE ARTERIOGRAM Right 07/14/2012   Procedure: INTRA OPERATIVE ARTERIOGRAM;  Surgeon: Sherren Kerns, MD;  Location: [facility] OR;  Service: Vascular;  Laterality: Right;   LEFT HEART CATHETERIZATION WITH CORONARY ANGIOGRAM N/A 04/24/2011   Procedure: LEFT HEART CATHETERIZATION WITH CORONARY ANGIOGRAM;  Surgeon: Othella Boyer, MD;  Location: [facility] CATH LAB;  Service: Cardiovascular;  Laterality: N/A;   PERIPHERAL VASCULAR BALLOON ANGIOPLASTY Right 10/19/2017   Procedure: PERIPHERAL VASCULAR BALLOON ANGIOPLASTY;  Surgeon: Cephus Shelling, MD;  Location: [facility] INVASIVE CV LAB;  Service: Cardiovascular;  Laterality: Right;  peroneal   PERIPHERAL VASCULAR INTERVENTION Right 10/19/2017   Procedure: PERIPHERAL VASCULAR INTERVENTION;  Surgeon: Cephus Shelling, MD;  Location: [facility] INVASIVE CV LAB;  Service: Cardiovascular;  Laterality: Right;  common iliac   TOTAL HIP ARTHROPLASTY Right 03/01/2020   Procedure: RIGHT TOTAL HIP ARTHROPLASTY ANTERIOR APPROACH;  Surgeon: Samson Frederic, MD;  Location: [facility] OR;  Service: Orthopedics;  Laterality: Right;       Family History  Problem Relation Age of Onset   Heart disease Father    Cancer  Brother    Diabetes Brother    Diabetes Daughter    Anesthesia problems Neg Hx    Hypotension Neg Hx    Malignant hyperthermia Neg Hx    Pseudochol deficiency Neg Hx     Social History   Tobacco Use   Smoking status: Former Smoker    Packs/day: 1.00    Years: 20.00    Pack years: 20.00    Types: Cigarettes    Quit date: 02/24/1978    Years since quitting: 42.0   Smokeless tobacco: Never Used  Vaping Use   Vaping Use: Never used  Substance Use Topics   Alcohol use: No   Drug use: No    Home Medications Prior to Admission medications   Medication Sig Start Date End Date Taking? Authorizing Provider  acetaminophen (TYLENOL) 500 MG tablet Take 1,000 mg by mouth 3 (three) times daily.   Yes [provider]  aspirin 81 MG chewable tablet Chew 1 tablet (81 mg total) by mouth 2 (two) times daily with a meal. Patient taking differently: Chew 81 mg by mouth in the morning and at bedtime. 03/02/20  04/16/20 Yes Barrie DunkerMcCauley, Larry B, PA  clopidogrel (PLAVIX) 75 MG tablet Take 1 tablet by mouth once daily Patient taking differently: Take 75 mg by mouth daily. 11/08/19  Yes Maeola Harmanain, Brandon Christopher, MD  docusate sodium (COLACE) 100 MG capsule Take 100 mg by mouth daily.   Yes [provider]  fenofibrate 160 MG tablet Take 160 mg by mouth daily. 06/23/15  Yes [provider]  ferrous gluconate (FERGON) 324 MG tablet Take 324 mg by mouth daily.   Yes [provider]  glipiZIDE (GLUCOTROL XL) 10 MG 24 hr tablet Take 10 mg by mouth daily.   Yes [provider]  Lidocaine 4 % PTCH Apply 1 patch topically in the morning and at bedtime.   Yes [provider]  magnesium oxide (MAG-OX) 400 (241.3 Mg) MG tablet Take 1 tablet (400 mg total) by mouth 2 (two) times daily. 07/21/15  Yes Dorothea OgleMyers, Iskra M, MD  menthol-cetylpyridinium (CEPACOL) 3 MG lozenge Take 1 lozenge by mouth 2 (two) times daily.   Yes [provider]  metFORMIN  (GLUCOPHAGE) 1000 MG tablet TAKE ONE TABLET BY MOUTH TWICE DAILY FOR  DIABETES Patient taking differently: Take 1,000 mg by mouth 2 (two) times daily with a meal. 10/21/12  Yes Reed, Tiffany L, DO  metoprolol tartrate (LOPRESSOR) 25 MG tablet Take 12.5 mg by mouth 2 (two) times daily. 03/06/15  Yes [provider]  nystatin (MYCOSTATIN/NYSTOP) powder Apply 1 application topically daily. Apply to groin rash   Yes [provider]  simvastatin (ZOCOR) 20 MG tablet Take 20 mg by mouth at bedtime. 02/20/20  Yes [provider]  traMADol (ULTRAM) 50 MG tablet Take 25 mg by mouth 2 (two) times daily.   Yes [provider]  Zinc Oxide 22 % CREA Apply 1 application topically See admin instructions. Apply to Diaper/groin area once daily And as needed   Yes [provider]  HYDROcodone-acetaminophen (NORCO) 7.5-325 MG tablet Take 1-2 tablets by mouth every 6 (six) hours as needed for severe pain (pain score 7-10). 03/02/20   Darrick GrinderMcCauley, Larry B, PA  simvastatin (ZOCOR) 40 MG tablet Take 1 tablet (40 mg total) by mouth at bedtime. Patient not taking: No sig reported 10/23/17   Burnadette PopAdhikari, Amrit, MD    Allergies    Patient has no known allergies.  Review of Systems   Review of Systems  Constitutional: Negative for chills and fever.  HENT: Negative for ear pain and sore throat.   Eyes: Negative for pain and visual disturbance.  Respiratory: Negative for cough and shortness of breath.   Cardiovascular: Negative for chest pain and palpitations.  Gastrointestinal: Negative for abdominal pain and vomiting.  Genitourinary: Negative for dysuria and hematuria.  Musculoskeletal: Positive for arthralgias and myalgias.  Skin: Negative for color change and rash.  Neurological: Negative for syncope, speech difficulty, light-headedness and headaches.  All other systems reviewed and are negative.   Physical Exam Updated Vital Signs BP (!) 156/59    Pulse 80    Temp 97.8 F  (36.6 C) (Oral)    Resp 18    SpO2 98%   Physical Exam Constitutional:      General: He is not in acute distress. HENT:     Head: Normocephalic and atraumatic.  Eyes:     Conjunctiva/sclera: Conjunctivae normal.     Pupils: Pupils are equal, round, and reactive to light.  Cardiovascular:     Rate and Rhythm: Normal rate and regular rhythm.  Pulses: Normal pulses.  Pulmonary:     Effort: Pulmonary effort is normal. No respiratory distress.  Abdominal:     General: There is no distension.     Tenderness: There is no abdominal tenderness.  Musculoskeletal:     Comments: Right hip incision site appears clean, nonerythematous  Skin:    General: Skin is warm and dry.  Neurological:     General: No focal deficit present.     Mental Status: He is alert. Mental status is at baseline.     Comments: AAO x 3  Psychiatric:        Mood and Affect: Mood normal.        Behavior: Behavior normal.     ED Results / Procedures / Treatments   Labs (all labs ordered are listed, but only abnormal results are displayed) Labs Reviewed  BASIC METABOLIC PANEL - Abnormal; Notable for the following components:      Result Value   Sodium 129 (*)    CO2 17 (*)    Glucose, Bld 156 (*)    Calcium 8.2 (*)    All other components within normal limits  CBC WITH DIFFERENTIAL/PLATELET - Abnormal; Notable for the following components:   RBC 2.83 (*)    Hemoglobin 8.6 (*)    HCT 26.9 (*)    RDW 15.7 (*)    Platelets 539 (*)    Neutro Abs 8.6 (*)    All other components within normal limits  POC SARS CORONAVIRUS 2 AG -  ED  TROPONIN I (HIGH SENSITIVITY)    EKG EKG Interpretation  Date/Time:  Friday March 16 2020 13:44:36 EST Ventricular Rate:  69 PR Interval:    QRS Duration: 132 QT Interval:  426 QTC Calculation: 456 R Axis:   90 Text Interpretation: Sinus arrhythmia Right bundle branch block Abnormal ECG No sig change from prior ecg Feb 29 2020 No STEMI Reconfirmed by Alvester Chou  [phone]) on 03/16/2020 2:14:42 PM   Radiology DG Chest 2 View  Result Date: 03/16/2020 CLINICAL DATA:  Altered mental status evaluate for pneumonia EXAM: CHEST - 2 VIEW COMPARISON:  Chest radiograph February 29, 2020 FINDINGS: The heart size and mediastinal contours are unchanged. Prior median sternotomy and CABG. Aortic atherosclerosis. Emphysematous changes in lung apices with scarring in the left mid lung. Hazy bibasilar opacities. No pleural effusion or pneumothorax. The visualized skeletal structures are unchanged. IMPRESSION: Hazy bibasilar opacities, which may represent atelectasis or infiltrate. Electronically Signed   By: Maudry Mayhew MD   On: 03/16/2020 13:32   CT Head Wo Contrast  Result Date: 03/15/2020 CLINICAL DATA:  Neuro deficit, acute, stroke suspected. EXAM: CT HEAD WITHOUT CONTRAST TECHNIQUE: Contiguous axial images were obtained from the base of the skull through the vertex without intravenous contrast. COMPARISON:  Head CT 07/14/2011. FINDINGS: Brain: Mild-to-moderate cerebral atrophy. Redemonstrated chronic lacunar infarcts within the left basal ganglia. Background mild ill-defined hypoattenuation within the cerebral white matter is nonspecific, but compatible with chronic small vessel ischemic disease. Two small lacunar infarcts within the left pons new as compared to the head CT of 07/14/2011 but otherwise age indeterminate (series 2, images 10 and 11). There is no acute intracranial hemorrhage. No demarcated cortical infarct. No extra-axial fluid collection. No evidence of intracranial mass. No midline shift. Vascular: No hyperdense vessel.  Atherosclerotic calcifications. Skull: Normal. Negative for fracture or focal lesion. Sinuses/Orbits: Visualized orbits show no acute finding. Mild mucosal thickening within the right ethmoid and maxillary sinuses at the imaged levels. Small  bilateral mastoid effusions. IMPRESSION: Two small lacunar infarcts within the left pons are new as  compared to the head CT of 07/14/2011 but otherwise age-indeterminate. Brain MRI may be obtained for further evaluation, if clinically warranted. Redemonstrated chronic lacunar infarcts within the left basal ganglia. Mild-to-moderate cerebral atrophy and mild cerebral white matter chronic small vessel ischemic disease. Mild paranasal sinus mucosal thickening. Small bilateral mastoid effusions. Electronically Signed   By: Jackey Loge DO   On: 03/15/2020 17:52   MR BRAIN WO CONTRAST  Result Date: 03/15/2020 CLINICAL DATA:  Initial evaluation for acute TIA, age-indeterminate infarct on prior CT. EXAM: MRI HEAD WITHOUT CONTRAST TECHNIQUE: Multiplanar, multiecho pulse sequences of the brain and surrounding structures were obtained without intravenous contrast. COMPARISON:  Prior CT from earlier the same day. FINDINGS: Brain: Diffuse prominence of the CSF containing spaces compatible with moderately advanced age-related cerebral and cerebellar atrophy. Patchy and confluent T2/FLAIR hyperintensity within the periventricular deep white matter both cerebral hemispheres most consistent with chronic small vessel ischemic disease, mild in nature. Few scattered remote lacunar infarcts noted about the left basal ganglia and hemispheric cerebral white matter. Subcentimeter symmetric foci of encephalomalacia involving the globus pallidi noted, nonspecific, but could be related to a prior ischemic or other toxic/metabolic insult. No abnormal foci of restricted diffusion to suggest acute or subacute ischemia. Gray-white matter differentiation maintained. No evidence for acute intracranial hemorrhage. Single punctate chronic microhemorrhage noted within the left temporal occipital region, of doubtful significance in isolation. No other evidence for chronic intracranial hemorrhage. No mass lesion, midline shift or mass effect. No hydrocephalus or extra-axial fluid collection. Pituitary gland suprasellar region within normal  limits. Midline structures intact. Vascular: Major intracranial vascular flow voids are maintained. Skull and upper cervical spine: Craniocervical junction within normal limits. Bone marrow signal intensity normal. No scalp soft tissue abnormality. Sinuses/Orbits: Patient status post bilateral ocular lens replacement. Paranasal sinuses are largely clear. Small bilateral mastoid effusions noted, of doubtful significance. Visualized nasopharynx within normal limits. Other: None. IMPRESSION: 1. No acute intracranial abnormality. 2. Subcentimeter symmetric foci of encephalomalacia involving the globus pallidi bilaterally, nonspecific, but could be related to a prior ischemic or other toxic/metabolic insult. 3. Moderately advanced age-related cerebral and cerebellar atrophy, with underlying chronic small vessel ischemic disease. Electronically Signed   By: Rise Mu [name]   On: 03/15/2020 22:29    Procedures Procedures (including critical care time)  Medications Ordered in ED Medications  HYDROcodone-acetaminophen (NORCO/VICODIN) 5-325 MG per tablet 1 tablet (1 tablet Oral Given 03/16/20 1655)  HYDROcodone-acetaminophen (NORCO/VICODIN) 5-325 MG per tablet 1 tablet (1 tablet Oral Given 03/16/20 2140)    ED Course  I have reviewed the triage vital signs and the nursing notes.  Pertinent labs & imaging results that were available during my care of the patient were reviewed by me and considered in my medical decision making (see chart for details).  80 yo male here with concern for confusion/behavioral change at [facility] facility  Patient awake and fully oriented on my exam No evident neurological deficits  We'll check CBC, BMP, and add on trop, UA, and xray to perform infection w/u and ACS workup with his history.  Na was mildly low but stable yesterday, we'll reassess for possible worsening metabolic derangement  Labs ordered and reviewed personally showing Na stable improved at 129,  Cr 0.85.  WBC, HGB, Platelets relatively stable near baseline. Trop 11 Covid negative Xray chest ordered and reviewed showing likely atelectasis  Prior medical records reviewed including CT/MRI  and workup from ED yesterday, recent hospitalization course in January for hip fracture s/p repair and c/b blood loss anemia  Clinical Course as of 03/16/20 2201  Fri Mar 16, 2020  1349 [facility] they [facility] the patient to the ED because he was catatonic this morning.  He wouldn't open his eyes, move, verbalize, or take his medicine.  [facility] vitals were [facility] throughout the [facility].  I subsequently went back and talked to the patient.  He reports that he's very upset with the nursing staff there, and that he was "acting out."  He states he remembers everything this morning.  I suspect this may have been a behavioral episode.  I explained that my job is to screen for medical emergencies, and that he needs to have a discussion with the rehab facility about why he's unhappy there - rather than being sent back to the ED because he is attempting to fake a coma.  He tells me he is ornery and upset for not being allowed to go home.  I advised some patience as they try to make sure he can walk safely before sending him home alone. [MT]  1413 ECG erroneously read as A Fib, upon closer review this appears to be a sinus arrhythmia. [MT]  1528 Hgb stable from prior levels (suspect elevation yesterday was likely 2/2 some hemoconcentration, as his WBC and platelets have likewise dropped today after getting fluids recently). [MT]  1529 SARS Coronavirus 2 Ag: NEGATIVE [MT]  1601 Na improved from yesterday, 129 today.  Cr wnl. [MT]  1603 [facility] updated regarding workup - okay for discharge.   [MT]    Clinical Course User Index [MT] Chijioke Lasser, Kermit BaloMatthew J, MD    Final Clinical Impression(s) / ED Diagnoses Final diagnoses:  Behavioral change    Rx / DC Orders ED  Discharge Orders    None       Terald Sleeperrifan, Jazion Atteberry J, MD 03/16/20 2201

## 2020-03-16 NOTE — ED Notes (Signed)
[facility] called  

## 2020-03-16 NOTE — ED Notes (Signed)
[facility] called for transport staff there states this is first notification

## 2020-03-16 NOTE — ED Triage Notes (Signed)
Pt BIB EMS. Pt is coming from [facility] re[facility] facility with altered mental status per rehab staff. Pt was seen here yesterday for low sodium levels yesterday.   BP-111/67 HR-76 RR-18 O2-96% CBG-169

## 2020-03-16 NOTE — Discharge Instructions (Addendum)
The patient's medical workup in the ER did not show any emergency causes for his lack of response at [facility] place.  Specifically his covid POC test was negative, his basic labs were unremarkable, his sodium was improved from yesterday, and there was no sign of acute cardiac disease.    Here in the ER, from his arrival he was awake, alert, coherent, and had clear speech.  When asked about his behavior at [facility], he told me that he was acting out, and that he's upset about needing to stay at rehab. He wants to go home. I encouraged him to have a conversation with the faculty there.  He had a major surgery and may be a fall risk. He verbalized understanding.  We felt he was medically cleared for discharge.

## 2020-04-09 ENCOUNTER — Other Ambulatory Visit: Payer: Self-pay

## 2020-04-09 ENCOUNTER — Encounter (HOSPITAL_COMMUNITY): Payer: Self-pay

## 2020-04-09 ENCOUNTER — Emergency Department (HOSPITAL_COMMUNITY): Payer: Medicare (Managed Care)

## 2020-04-09 ENCOUNTER — Inpatient Hospital Stay (HOSPITAL_COMMUNITY)
Admission: EM | Admit: 2020-04-09 | Discharge: 2020-04-13 | DRG: 177 | Disposition: A | Discharge status: Hospice | Payer: Medicare (Managed Care) | Attending: Family Medicine | Admitting: Family Medicine

## 2020-04-09 DIAGNOSIS — Z89421 Acquired absence of other right toe(s): Secondary | ICD-10-CM

## 2020-04-09 DIAGNOSIS — Z7984 Long term (current) use of oral hypoglycemic drugs: Secondary | ICD-10-CM

## 2020-04-09 DIAGNOSIS — Z515 Encounter for palliative care: Secondary | ICD-10-CM

## 2020-04-09 DIAGNOSIS — E118 Type 2 diabetes mellitus with unspecified complications: Secondary | ICD-10-CM | POA: Insufficient documentation

## 2020-04-09 DIAGNOSIS — N179 Acute kidney failure, unspecified: Secondary | ICD-10-CM | POA: Diagnosis present

## 2020-04-09 DIAGNOSIS — E782 Mixed hyperlipidemia: Secondary | ICD-10-CM | POA: Diagnosis present

## 2020-04-09 DIAGNOSIS — E872 Acidosis, unspecified: Secondary | ICD-10-CM | POA: Diagnosis present

## 2020-04-09 DIAGNOSIS — Z7902 Long term (current) use of antithrombotics/antiplatelets: Secondary | ICD-10-CM

## 2020-04-09 DIAGNOSIS — Z7982 Long term (current) use of aspirin: Secondary | ICD-10-CM

## 2020-04-09 DIAGNOSIS — Z79899 Other long term (current) drug therapy: Secondary | ICD-10-CM

## 2020-04-09 DIAGNOSIS — R54 Age-related physical debility: Secondary | ICD-10-CM | POA: Diagnosis present

## 2020-04-09 DIAGNOSIS — L89150 Pressure ulcer of sacral region, unstageable: Secondary | ICD-10-CM | POA: Diagnosis present

## 2020-04-09 DIAGNOSIS — I2510 Atherosclerotic heart disease of native coronary artery without angina pectoris: Secondary | ICD-10-CM | POA: Diagnosis present

## 2020-04-09 DIAGNOSIS — Z951 Presence of aortocoronary bypass graft: Secondary | ICD-10-CM

## 2020-04-09 DIAGNOSIS — E860 Dehydration: Secondary | ICD-10-CM | POA: Diagnosis present

## 2020-04-09 DIAGNOSIS — Z8616 Personal history of COVID-19: Secondary | ICD-10-CM | POA: Diagnosis present

## 2020-04-09 DIAGNOSIS — E875 Hyperkalemia: Secondary | ICD-10-CM | POA: Diagnosis present

## 2020-04-09 DIAGNOSIS — J439 Emphysema, unspecified: Secondary | ICD-10-CM | POA: Diagnosis present

## 2020-04-09 DIAGNOSIS — E861 Hypovolemia: Secondary | ICD-10-CM | POA: Diagnosis present

## 2020-04-09 DIAGNOSIS — E1151 Type 2 diabetes mellitus with diabetic peripheral angiopathy without gangrene: Secondary | ICD-10-CM | POA: Diagnosis present

## 2020-04-09 DIAGNOSIS — Z6820 Body mass index (BMI) 20.0-20.9, adult: Secondary | ICD-10-CM

## 2020-04-09 DIAGNOSIS — S31000A Unspecified open wound of lower back and pelvis without penetration into retroperitoneum, initial encounter: Secondary | ICD-10-CM | POA: Diagnosis present

## 2020-04-09 DIAGNOSIS — R64 Cachexia: Secondary | ICD-10-CM | POA: Diagnosis present

## 2020-04-09 DIAGNOSIS — U071 COVID-19: Secondary | ICD-10-CM | POA: Diagnosis not present

## 2020-04-09 DIAGNOSIS — R778 Other specified abnormalities of plasma proteins: Secondary | ICD-10-CM | POA: Diagnosis present

## 2020-04-09 DIAGNOSIS — Z96641 Presence of right artificial hip joint: Secondary | ICD-10-CM | POA: Diagnosis present

## 2020-04-09 DIAGNOSIS — Z9981 Dependence on supplemental oxygen: Secondary | ICD-10-CM

## 2020-04-09 DIAGNOSIS — I10 Essential (primary) hypertension: Secondary | ICD-10-CM | POA: Diagnosis present

## 2020-04-09 DIAGNOSIS — Z8701 Personal history of pneumonia (recurrent): Secondary | ICD-10-CM

## 2020-04-09 DIAGNOSIS — Z66 Do not resuscitate: Secondary | ICD-10-CM | POA: Diagnosis present

## 2020-04-09 DIAGNOSIS — J9611 Chronic respiratory failure with hypoxia: Secondary | ICD-10-CM | POA: Diagnosis present

## 2020-04-09 DIAGNOSIS — Z87891 Personal history of nicotine dependence: Secondary | ICD-10-CM

## 2020-04-09 DIAGNOSIS — Z8249 Family history of ischemic heart disease and other diseases of the circulatory system: Secondary | ICD-10-CM

## 2020-04-09 DIAGNOSIS — Z833 Family history of diabetes mellitus: Secondary | ICD-10-CM

## 2020-04-09 DIAGNOSIS — E1169 Type 2 diabetes mellitus with other specified complication: Secondary | ICD-10-CM | POA: Diagnosis present

## 2020-04-09 DIAGNOSIS — G9341 Metabolic encephalopathy: Secondary | ICD-10-CM | POA: Diagnosis present

## 2020-04-09 DIAGNOSIS — I252 Old myocardial infarction: Secondary | ICD-10-CM

## 2020-04-09 LAB — CBC WITH DIFFERENTIAL/PLATELET
Abs Immature Granulocytes: 0.06 10*3/uL (ref 0.00–0.07)
Basophils Absolute: 0 10*3/uL (ref 0.0–0.1)
Basophils Relative: 0 %
Eosinophils Absolute: 0 10*3/uL (ref 0.0–0.5)
Eosinophils Relative: 0 %
HCT: 34.2 % — ABNORMAL LOW (ref 39.0–52.0)
Hemoglobin: 10.4 g/dL — ABNORMAL LOW (ref 13.0–17.0)
Immature Granulocytes: 0 %
Lymphocytes Relative: 4 %
Lymphs Abs: 0.6 10*3/uL — ABNORMAL LOW (ref 0.7–4.0)
MCH: 29.4 pg (ref 26.0–34.0)
MCHC: 30.4 g/dL (ref 30.0–36.0)
MCV: 96.6 fL (ref 80.0–100.0)
Monocytes Absolute: 0.8 10*3/uL (ref 0.1–1.0)
Monocytes Relative: 5 %
Neutro Abs: 13.3 10*3/uL — ABNORMAL HIGH (ref 1.7–7.7)
Neutrophils Relative %: 91 %
Platelets: 570 10*3/uL — ABNORMAL HIGH (ref 150–400)
RBC: 3.54 MIL/uL — ABNORMAL LOW (ref 4.22–5.81)
RDW: 16.2 % — ABNORMAL HIGH (ref 11.5–15.5)
WBC: 14.7 10*3/uL — ABNORMAL HIGH (ref 4.0–10.5)
nRBC: 0 % (ref 0.0–0.2)

## 2020-04-09 LAB — BASIC METABOLIC PANEL
Anion gap: 18 — ABNORMAL HIGH (ref 5–15)
BUN: 54 mg/dL — ABNORMAL HIGH (ref 8–23)
CO2: 11 mmol/L — ABNORMAL LOW (ref 22–32)
Calcium: 8.3 mg/dL — ABNORMAL LOW (ref 8.9–10.3)
Chloride: 106 mmol/L (ref 98–111)
Creatinine, Ser: 1.29 mg/dL — ABNORMAL HIGH (ref 0.61–1.24)
GFR, Estimated: 56 mL/min — ABNORMAL LOW (ref >60)
Glucose, Bld: 150 mg/dL — ABNORMAL HIGH (ref 70–99)
Potassium: 6.2 mmol/L — ABNORMAL HIGH (ref 3.5–5.1)
Sodium: 135 mmol/L (ref 135–145)

## 2020-04-09 LAB — TROPONIN I (HIGH SENSITIVITY): Troponin I (High Sensitivity): 23 ng/L — ABNORMAL HIGH (ref <18)

## 2020-04-09 MED ORDER — SODIUM CHLORIDE 0.9 % IV BOLUS
1000.0000 mL | Freq: Once | INTRAVENOUS | Status: AC
  Administered 2020-04-10: 1000 mL via INTRAVENOUS

## 2020-04-09 MED ORDER — SODIUM ZIRCONIUM CYCLOSILICATE 10 G PO PACK
10.0000 g | PACK | Freq: Once | ORAL | Status: AC
  Administered 2020-04-10: 10 g via ORAL

## 2020-04-09 NOTE — ED Triage Notes (Signed)
Pt to ED by EMS from [facility]. EMS was called for pt having covid symptoms exacerbation. Pt was diagnosed positive approx 2 weeks ago. Upon EMS arrival lungs are clear, o2 is 100% and pt denies any complaints besides a sacral wound due to bed sore. Arrives A+O to baseline, VSS, NADN.

## 2020-04-09 NOTE — ED Provider Notes (Signed)
[facility]-EMERGENCY DEPT Provider Note   CSN: [id number] Arrival date & time: 04/09/20  2115     History Chief Complaint  Patient presents with  . Sore    Daniel Tyler is a 80 y.o. male presenting from [facility] with concern for hypoxia.  The patient has hx of behavioral issues, possible confusion or dementia, and presents from [facility] with staff concern for hypoxia to "70%" per their report to me on the phone.  He wears 3L [place] at baseline and was diagnosed with covid approx 2 weeks ago.  EMS reports on their arrival today the patient was 100% on 3L [place] with clear breath sounds, speaking in full sentences.  On arrival the patient cannot provide any further history, other than to ask me repeatedly for water.  Medical record review does show a hx of hyponatremia    HPI     Past Medical History:  Diagnosis Date  . Anemia   . Carotid stenosis   . Coronary artery disease   . Depression    bc of foot pain but no meds required  . Diabetes mellitus    takes Glipizide and Metformin daily;Lantus nightly  . Enlarged heart   . Hyperlipidemia   . Myocardial infarction (HCC) 2013  . Pneumonia 2013   hx of  . Skin spots-aging   . Weakness of right leg     Patient Active Problem List   Diagnosis Date Noted  . Hip fracture (HCC) 02/29/2020  . Closed right hip fracture, initial encounter (HCC) 02/29/2020  . Anxiety 12/08/2017  . Hyperlipidemia 12/08/2017  . Malnutrition of moderate degree 10/18/2017  . Gangrene of toe of right foot (HCC) 10/16/2017  . Cellulitis of great toe of right foot 10/16/2017  . Osteomyelitis of great toe of right foot (HCC) 10/16/2017  . Uncontrolled type 2 diabetes mellitus with peripheral neuropathy (HCC) 06/10/2016  . Skin lesion 11/30/2015  . Anemia 09/15/2015  . Right foot ulcer (HCC) 09/15/2015  . Renal insufficiency 09/15/2015  . Syncope 07/20/2015  . Aftercare following surgery of the circulatory system, NEC 09/09/2012   . Carotid artery stenosis 09/09/2012  . Peripheral vascular disease, unspecified (HCC) 07/29/2012  . Atherosclerosis of native arteries of the extremities with ulceration (HCC) 07/08/2012  . Type II or unspecified type diabetes mellitus with peripheral circulatory disorders, uncontrolled(250.72) 07/08/2012  . Diabetic foot ulcer (HCC) 06/26/2012  . Occlusion and stenosis of carotid artery without mention of cerebral infarction 07/24/2011  . Hypertension   . COPD (chronic obstructive pulmonary disease) (HCC)   . Anemia due to blood loss, acute   . S/P CABG (coronary artery bypass graft) 07/01/2011  . Carotid artery disease (HCC) 07/01/2011  . Disorder of arteries and arterioles (HCC) 07/01/2011  . Depression 06/07/2011  . Dyslipidemia   . CAD (coronary artery disease) 04/24/2011  . Insulin dependent type 2 diabetes mellitus, controlled (HCC)     Past Surgical History:  Procedure Laterality Date  . ABDOMINAL AORTAGRAM    . ABDOMINAL AORTAGRAM N/A 07/09/2012   Procedure: ABDOMINAL Ronny Flurry;  Surgeon: Sherren Kerns, MD;  Location: [facility] CATH LAB;  Service: Cardiovascular;  Laterality: N/A;  . ABDOMINAL AORTOGRAM W/LOWER EXTREMITY N/A 10/19/2017   Procedure: ABDOMINAL AORTOGRAM W/LOWER EXTREMITY;  Surgeon: Cephus Shelling, MD;  Location: [facility] INVASIVE CV LAB;  Service: Cardiovascular;  Laterality: N/A;  . AMPUTATION Right 07/14/2012   Procedure: AMPUTATION DIGIT;  Surgeon: Sherren Kerns, MD;  Location: [facility] OR;  Service: Vascular;  Laterality:  Right;  Amputation Right Fourth Toe  . AMPUTATION Right 10/20/2017   Procedure: AMPUTATION DIGIT;  Surgeon: Maeola Harman, MD;  Location: [facility] OR;  Service: Vascular;  Laterality: Right;  . CARDIAC CATHETERIZATION  2013  . CAROTID ENDARTERECTOMY  07/01/11   Right CEA  . COLONOSCOPY    . CORONARY ARTERY BYPASS GRAFT  07/01/2011   Procedure: CORONARY ARTERY BYPASS GRAFTING (CABG);  Surgeon: Loreli Slot, MD;  Location: [facility] OR;   Service: Open Heart Surgery;  Laterality: N/A;  . ENDARTERECTOMY  07/01/2011   Procedure: ENDARTERECTOMY CAROTID;  Surgeon: Sherren Kerns, MD;  Location: [facility] OR;  Service: Vascular;  Laterality: Right;  . FEMORAL-POPLITEAL BYPASS GRAFT Right 07/14/2012   Procedure: BYPASS GRAFT FEMORAL-POPLITEAL ARTERY;  Surgeon: Sherren Kerns, MD;  Location: [facility] OR;  Service: Vascular;  Laterality: Right;  Right Femoral Popliteal Bypass with Left Leg Nonreversed Greater Saphenous Vein  . INTRAOPERATIVE ARTERIOGRAM Right 07/14/2012   Procedure: INTRA OPERATIVE ARTERIOGRAM;  Surgeon: Sherren Kerns, MD;  Location: [facility] OR;  Service: Vascular;  Laterality: Right;  . LEFT HEART CATHETERIZATION WITH CORONARY ANGIOGRAM N/A 04/24/2011   Procedure: LEFT HEART CATHETERIZATION WITH CORONARY ANGIOGRAM;  Surgeon: Othella Boyer, MD;  Location: [facility] CATH LAB;  Service: Cardiovascular;  Laterality: N/A;  . PERIPHERAL VASCULAR BALLOON ANGIOPLASTY Right 10/19/2017   Procedure: PERIPHERAL VASCULAR BALLOON ANGIOPLASTY;  Surgeon: Cephus Shelling, MD;  Location: [facility] INVASIVE CV LAB;  Service: Cardiovascular;  Laterality: Right;  peroneal  . PERIPHERAL VASCULAR INTERVENTION Right 10/19/2017   Procedure: PERIPHERAL VASCULAR INTERVENTION;  Surgeon: Cephus Shelling, MD;  Location: [facility] INVASIVE CV LAB;  Service: Cardiovascular;  Laterality: Right;  common iliac  . TOTAL HIP ARTHROPLASTY Right 03/01/2020   Procedure: RIGHT TOTAL HIP ARTHROPLASTY ANTERIOR APPROACH;  Surgeon: Samson Frederic, MD;  Location: [facility] OR;  Service: Orthopedics;  Laterality: Right;       Family History  Problem Relation Age of Onset  . Heart disease Father   . Cancer Brother   . Diabetes Brother   . Diabetes Daughter   . Anesthesia problems Neg Hx   . Hypotension Neg Hx   . Malignant hyperthermia Neg Hx   . Pseudochol deficiency Neg Hx     Social History   Tobacco Use  . Smoking status: Former Smoker    Packs/day: 1.00    Years: 20.00    Pack  years: 20.00    Types: Cigarettes    Quit date: 02/24/1978    Years since quitting: 42.1  . Smokeless tobacco: Never Used  Vaping Use  . Vaping Use: Never used  Substance Use Topics  . Alcohol use: No  . Drug use: No    Home Medications Prior to Admission medications   Medication Sig Start Date End Date Taking? Authorizing Provider  acetaminophen (TYLENOL) 500 MG tablet Take 1,000 mg by mouth 3 (three) times daily.    [provider]  aspirin 81 MG chewable tablet Chew 1 tablet (81 mg total) by mouth 2 (two) times daily with a meal. Patient taking differently: Chew 81 mg by mouth in the morning and at bedtime. 03/02/20 04/16/20  [name], PA  clopidogrel (PLAVIX) 75 MG tablet Take 1 tablet by mouth once daily Patient taking differently: Take 75 mg by mouth daily. 11/08/19   Maeola Harman, MD  docusate sodium (COLACE) 100 MG capsule Take 100 mg by mouth daily.    [provider]  fenofibrate 160 MG tablet Take 160 mg  by mouth daily. 06/23/15   [provider]  ferrous gluconate (FERGON) 324 MG tablet Take 324 mg by mouth daily.    [provider]  glipiZIDE (GLUCOTROL XL) 10 MG 24 hr tablet Take 10 mg by mouth daily.    [provider]  HYDROcodone-acetaminophen (NORCO) 7.5-325 MG tablet Take 1-2 tablets by mouth every 6 (six) hours as needed for severe pain (pain score 7-10). 03/02/20   Barrie Dunker B, PA  Lidocaine 4 % PTCH Apply 1 patch topically in the morning and at bedtime.    [provider]  magnesium oxide (MAG-OX) 400 (241.3 Mg) MG tablet Take 1 tablet (400 mg total) by mouth 2 (two) times daily. 07/21/15   Dorothea Ogle, MD  menthol-cetylpyridinium (CEPACOL) 3 MG lozenge Take 1 lozenge by mouth 2 (two) times daily.    [provider]  metFORMIN (GLUCOPHAGE) 1000 MG tablet TAKE ONE TABLET BY MOUTH TWICE DAILY FOR  DIABETES Patient taking differently: Take 1,000 mg by mouth 2 (two) times daily with a  meal. 10/21/12   Reed, Tiffany L, DO  metoprolol tartrate (LOPRESSOR) 25 MG tablet Take 12.5 mg by mouth 2 (two) times daily. 03/06/15   [provider]  nystatin (MYCOSTATIN/NYSTOP) powder Apply 1 application topically daily. Apply to groin rash    [provider]  simvastatin (ZOCOR) 20 MG tablet Take 20 mg by mouth at bedtime. 02/20/20   [provider]  simvastatin (ZOCOR) 40 MG tablet Take 1 tablet (40 mg total) by mouth at bedtime. Patient not taking: No sig reported 10/23/17   Burnadette Pop, MD  traMADol (ULTRAM) 50 MG tablet Take 25 mg by mouth 2 (two) times daily.    [provider]  Zinc Oxide 22 % CREA Apply 1 application topically See admin instructions. Apply to Diaper/groin area once daily And as needed    [provider]    Allergies    Patient has no known allergies.  Review of Systems   Review of Systems  Constitutional: Negative for chills and fever.  Respiratory: Negative for cough and shortness of breath.   Cardiovascular: Negative for chest pain and palpitations.  Gastrointestinal: Negative for abdominal pain and vomiting.  Musculoskeletal: Positive for arthralgias and myalgias.  Skin: Negative for color change and rash.  Neurological: Negative for syncope and headaches.  All other systems reviewed and are negative.   Physical Exam Updated Vital Signs BP 103/89 (BP Location: Left Arm)   Pulse 100   Temp 97.7 F (36.5 C) (Oral)   Resp 20   Ht 5\' 9"  (1.753 m)   Wt 63.5 kg   SpO2 97%   BMI 20.67 kg/m   Physical Exam Constitutional:      General: He is not in acute distress.    Comments: Chronically ill appearing  HENT:     Head: Normocephalic and atraumatic.     Mouth/Throat:     Comments: Dry mucous membranes Eyes:     Conjunctiva/sclera: Conjunctivae normal.     Pupils: Pupils are equal, round, and reactive to light.  Cardiovascular:     Rate and Rhythm: Normal rate and regular rhythm.  Pulmonary:      Effort: Pulmonary effort is normal. No respiratory distress.  Skin:    General: Skin is warm and dry.     Comments: Several scabs on body Sacral decubitus ulcer  Neurological:     General: No focal deficit present.     Mental Status: He is alert. Mental  status is at baseline.     ED Results / Procedures / Treatments   Labs (all labs ordered are listed, but only abnormal results are displayed) Labs Reviewed - No data to display  EKG None  Radiology No results found.  Procedures Procedures   Medications Ordered in ED Medications - No data to display  ED Course  I have reviewed the triage vital signs and the nursing notes.  Pertinent labs & imaging results that were available during my care of the patient were reviewed by me and considered in my medical decision making (see chart for details).  80 yo male presenting from [facility] with reports of hypoxia at [facility] and poor appetite x 3 days, in setting of covid diagnosis 2 weeks ago.  He is on 3L [place] here which is his baseline at [facility].  No hypoxia.  Breathing comfortably.  Xray with some expected covid PNA type pattern.  He is already on doxycycline per medical list. I think it's appropriate to continue that.   He is not needing bipap or hiflow here.  WBC mildly elevated, as is hgb - suspect some hemoconcentration likely the cause.  He appears dehydrated on exam and per phone report has been refusing food and fluids at [facility].  Here he is asking for water.  Unfortunately there appears to be a history of behavioral noncompliance with treatment per his last visit to our ED.  For poor PO intake I've ordered electrolytes, also trop level.   Doubt ACS, PE, PTX.  Clinical Course as of 04/10/20 0937  Mon Apr 09, 2020  2353 Labs consistent with dehydration, BUN 54, Cr 1.3 (has been Cr 1.0 at recent baseline), Anion gap  18.  Trop also mildly elevated at 23 but baseline is 18.  We'll give some fluids here and recheck BMP,  if improved he can be discharged back to [facility]. [MT]  2354 Signed out to Dr Eudelia Bunchardama EDP [MT]    Clinical Course User Index [MT] Renaye Rakersrifan, Kermit BaloMatthew J, MD    Final Clinical Impression(s) / ED Diagnoses Final diagnoses:  None    Rx / DC Orders ED Discharge Orders    None       Renaye Rakersrifan, Kermit BaloMatthew J, MD 04/10/20 [phone]

## 2020-04-10 ENCOUNTER — Encounter (HOSPITAL_COMMUNITY): Payer: Self-pay | Admitting: Internal Medicine

## 2020-04-10 ENCOUNTER — Observation Stay (HOSPITAL_COMMUNITY): Payer: Medicare (Managed Care)

## 2020-04-10 DIAGNOSIS — E1169 Type 2 diabetes mellitus with other specified complication: Secondary | ICD-10-CM

## 2020-04-10 DIAGNOSIS — E861 Hypovolemia: Secondary | ICD-10-CM | POA: Diagnosis present

## 2020-04-10 DIAGNOSIS — E860 Dehydration: Secondary | ICD-10-CM | POA: Diagnosis present

## 2020-04-10 DIAGNOSIS — N179 Acute kidney failure, unspecified: Secondary | ICD-10-CM | POA: Diagnosis present

## 2020-04-10 DIAGNOSIS — E872 Acidosis, unspecified: Secondary | ICD-10-CM | POA: Diagnosis present

## 2020-04-10 DIAGNOSIS — E782 Mixed hyperlipidemia: Secondary | ICD-10-CM | POA: Diagnosis present

## 2020-04-10 DIAGNOSIS — U071 COVID-19: Secondary | ICD-10-CM | POA: Diagnosis present

## 2020-04-10 DIAGNOSIS — Z7902 Long term (current) use of antithrombotics/antiplatelets: Secondary | ICD-10-CM | POA: Diagnosis not present

## 2020-04-10 DIAGNOSIS — R54 Age-related physical debility: Secondary | ICD-10-CM | POA: Diagnosis present

## 2020-04-10 DIAGNOSIS — S31000A Unspecified open wound of lower back and pelvis without penetration into retroperitoneum, initial encounter: Secondary | ICD-10-CM | POA: Diagnosis present

## 2020-04-10 DIAGNOSIS — E875 Hyperkalemia: Secondary | ICD-10-CM | POA: Diagnosis present

## 2020-04-10 DIAGNOSIS — J439 Emphysema, unspecified: Secondary | ICD-10-CM

## 2020-04-10 DIAGNOSIS — Z515 Encounter for palliative care: Secondary | ICD-10-CM | POA: Diagnosis not present

## 2020-04-10 DIAGNOSIS — R64 Cachexia: Secondary | ICD-10-CM | POA: Diagnosis present

## 2020-04-10 DIAGNOSIS — Z9981 Dependence on supplemental oxygen: Secondary | ICD-10-CM | POA: Diagnosis not present

## 2020-04-10 DIAGNOSIS — I2510 Atherosclerotic heart disease of native coronary artery without angina pectoris: Secondary | ICD-10-CM

## 2020-04-10 DIAGNOSIS — Z8616 Personal history of COVID-19: Secondary | ICD-10-CM | POA: Diagnosis present

## 2020-04-10 DIAGNOSIS — I10 Essential (primary) hypertension: Secondary | ICD-10-CM

## 2020-04-10 DIAGNOSIS — Z7984 Long term (current) use of oral hypoglycemic drugs: Secondary | ICD-10-CM | POA: Diagnosis not present

## 2020-04-10 DIAGNOSIS — J9611 Chronic respiratory failure with hypoxia: Secondary | ICD-10-CM | POA: Diagnosis present

## 2020-04-10 DIAGNOSIS — E1151 Type 2 diabetes mellitus with diabetic peripheral angiopathy without gangrene: Secondary | ICD-10-CM | POA: Diagnosis present

## 2020-04-10 DIAGNOSIS — Z96641 Presence of right artificial hip joint: Secondary | ICD-10-CM | POA: Diagnosis present

## 2020-04-10 DIAGNOSIS — G9341 Metabolic encephalopathy: Secondary | ICD-10-CM

## 2020-04-10 DIAGNOSIS — Z951 Presence of aortocoronary bypass graft: Secondary | ICD-10-CM | POA: Diagnosis not present

## 2020-04-10 DIAGNOSIS — Z66 Do not resuscitate: Secondary | ICD-10-CM | POA: Diagnosis present

## 2020-04-10 DIAGNOSIS — Z7982 Long term (current) use of aspirin: Secondary | ICD-10-CM | POA: Diagnosis not present

## 2020-04-10 DIAGNOSIS — Z79899 Other long term (current) drug therapy: Secondary | ICD-10-CM | POA: Diagnosis not present

## 2020-04-10 DIAGNOSIS — E118 Type 2 diabetes mellitus with unspecified complications: Secondary | ICD-10-CM

## 2020-04-10 LAB — CBG MONITORING, ED
Glucose-Capillary: 139 mg/dL — ABNORMAL HIGH (ref 70–99)
Glucose-Capillary: 126 mg/dL — ABNORMAL HIGH (ref 70–99)
Glucose-Capillary: 115 mg/dL — ABNORMAL HIGH (ref 70–99)

## 2020-04-10 LAB — TSH: TSH: 0.729 u[IU]/mL (ref 0.350–4.500)

## 2020-04-10 LAB — BASIC METABOLIC PANEL
Anion gap: 15 (ref 5–15)
Anion gap: 11 (ref 5–15)
BUN: 55 mg/dL — ABNORMAL HIGH (ref 8–23)
BUN: 54 mg/dL — ABNORMAL HIGH (ref 8–23)
CO2: 15 mmol/L — ABNORMAL LOW (ref 22–32)
CO2: 17 mmol/L — ABNORMAL LOW (ref 22–32)
Calcium: 8 mg/dL — ABNORMAL LOW (ref 8.9–10.3)
Calcium: 7.7 mg/dL — ABNORMAL LOW (ref 8.9–10.3)
Chloride: 108 mmol/L (ref 98–111)
Chloride: 115 mmol/L — ABNORMAL HIGH (ref 98–111)
Creatinine, Ser: 1.32 mg/dL — ABNORMAL HIGH (ref 0.61–1.24)
Creatinine, Ser: 1.06 mg/dL (ref 0.61–1.24)
GFR, Estimated: 55 mL/min — ABNORMAL LOW (ref >60)
GFR, Estimated: >60 mL/min (ref >60)
Glucose, Bld: 151 mg/dL — ABNORMAL HIGH (ref 70–99)
Glucose, Bld: 141 mg/dL — ABNORMAL HIGH (ref 70–99)
Potassium: 5.9 mmol/L — ABNORMAL HIGH (ref 3.5–5.1)
Potassium: 4.9 mmol/L (ref 3.5–5.1)
Sodium: 138 mmol/L (ref 135–145)
Sodium: 143 mmol/L (ref 135–145)

## 2020-04-10 LAB — LACTIC ACID, PLASMA
Lactic Acid, Venous: 2.4 mmol/L (ref 0.5–1.9)
Lactic Acid, Venous: 1.4 mmol/L (ref 0.5–1.9)

## 2020-04-10 LAB — RESP PANEL BY RT-PCR (FLU A&B, COVID) ARPGX2
Influenza A by PCR: NEGATIVE
Influenza B by PCR: NEGATIVE
SARS Coronavirus 2 by RT PCR: POSITIVE — AB

## 2020-04-10 LAB — BLOOD GAS, VENOUS
Acid-base deficit: 9.3 mmol/L — ABNORMAL HIGH (ref 0.0–2.0)
Bicarbonate: 17.1 mmol/L — ABNORMAL LOW (ref 20.0–28.0)
FIO2: 21
O2 Saturation: 16.6 %
Patient temperature: 98.6
pCO2, Ven: 42.4 mmHg — ABNORMAL LOW (ref 44.0–60.0)
pH, Ven: 7.231 — ABNORMAL LOW (ref 7.250–7.430)
pO2, Ven: <31 mmHg — CL (ref 32.0–45.0)

## 2020-04-10 LAB — VITAMIN B12: Vitamin B-12: 1275 pg/mL — ABNORMAL HIGH (ref 180–914)

## 2020-04-10 LAB — TROPONIN I (HIGH SENSITIVITY): Troponin I (High Sensitivity): 25 ng/L — ABNORMAL HIGH (ref <18)

## 2020-04-10 LAB — SEDIMENTATION RATE: Sed Rate: 67 mm/hr — ABNORMAL HIGH (ref 0–16)

## 2020-04-10 LAB — FOLATE: Folate: 5.6 ng/mL — ABNORMAL LOW (ref >5.9)

## 2020-04-10 LAB — GLUCOSE, CAPILLARY: Glucose-Capillary: 157 mg/dL — ABNORMAL HIGH (ref 70–99)

## 2020-04-10 LAB — AMMONIA: Ammonia: 19 umol/L (ref 9–35)

## 2020-04-10 MED ORDER — DOXYCYCLINE HYCLATE 100 MG PO TABS
100.0000 mg | ORAL_TABLET | Freq: Two times a day (BID) | ORAL | Status: AC
  Administered 2020-04-10 – 2020-04-11 (×3): 100 mg via ORAL
  Filled 2020-04-10 (×5): qty 1

## 2020-04-10 MED ORDER — IPRATROPIUM-ALBUTEROL 0.5-2.5 (3) MG/3ML IN SOLN: 3.0000 mL | RESPIRATORY_TRACT | Status: DC | PRN

## 2020-04-10 MED ORDER — SODIUM CHLORIDE 0.9 % IV SOLN: INTRAVENOUS | Status: DC

## 2020-04-10 MED ORDER — INSULIN ASPART 100 UNIT/ML ~~LOC~~ SOLN
0.0000 [IU] | Freq: Three times a day (TID) | SUBCUTANEOUS | Status: DC
  Administered 2020-04-10: 2 [IU] via SUBCUTANEOUS
  Administered 2020-04-11: 3 [IU] via SUBCUTANEOUS
  Filled 2020-04-10: qty 0.15

## 2020-04-10 MED ORDER — ENOXAPARIN SODIUM 40 MG/0.4ML ~~LOC~~ SOLN
40.0000 mg | SUBCUTANEOUS | Status: DC
  Administered 2020-04-10 – 2020-04-11 (×2): 40 mg via SUBCUTANEOUS
  Filled 2020-04-10 (×2): qty 0.4

## 2020-04-10 MED ORDER — DIVALPROEX SODIUM 125 MG PO CSDR
125.0000 mg | DELAYED_RELEASE_CAPSULE | Freq: Two times a day (BID) | ORAL | Status: DC
  Administered 2020-04-10 (×2): 125 mg via ORAL
  Filled 2020-04-10 (×7): qty 1

## 2020-04-10 MED ORDER — CLOPIDOGREL BISULFATE 75 MG PO TABS
75.0000 mg | ORAL_TABLET | Freq: Every day | ORAL | Status: DC
  Administered 2020-04-10 – 2020-04-11 (×2): 75 mg via ORAL
  Filled 2020-04-10 (×3): qty 1

## 2020-04-10 MED ORDER — SODIUM CHLORIDE 0.9 % IV BOLUS
1000.0000 mL | Freq: Once | INTRAVENOUS | Status: AC
  Administered 2020-04-10: 1000 mL via INTRAVENOUS

## 2020-04-10 MED ORDER — METOPROLOL TARTRATE 12.5 MG HALF TABLET
12.5000 mg | ORAL_TABLET | Freq: Two times a day (BID) | ORAL | Status: DC
  Administered 2020-04-10 – 2020-04-11 (×4): 12.5 mg via ORAL
  Filled 2020-04-10 (×4): qty 1

## 2020-04-10 MED ORDER — POLYETHYLENE GLYCOL 3350 17 G PO PACK: 17.0000 g | PACK | Freq: Every day | ORAL | Status: DC | PRN

## 2020-04-10 MED ORDER — FERROUS GLUCONATE 324 (38 FE) MG PO TABS
324.0000 mg | ORAL_TABLET | Freq: Every day | ORAL | Status: DC
  Filled 2020-04-10 (×3): qty 1

## 2020-04-10 MED ORDER — ACETAMINOPHEN 650 MG RE SUPP: 650.0000 mg | Freq: Four times a day (QID) | RECTAL | Status: DC | PRN

## 2020-04-10 MED ORDER — ACETAMINOPHEN 325 MG PO TABS
650.0000 mg | ORAL_TABLET | Freq: Four times a day (QID) | ORAL | Status: DC | PRN
  Administered 2020-04-11: 650 mg via ORAL
  Filled 2020-04-10 (×3): qty 2

## 2020-04-10 MED ORDER — PSYLLIUM 95 % PO PACK
1.0000 | PACK | Freq: Two times a day (BID) | ORAL | Status: DC
  Administered 2020-04-10 (×2): 1 via ORAL
  Filled 2020-04-10 (×4): qty 1

## 2020-04-10 MED ORDER — SODIUM ZIRCONIUM CYCLOSILICATE 10 G PO PACK
10.0000 g | PACK | Freq: Once | ORAL | Status: AC
  Administered 2020-04-10: 10 g via ORAL
  Filled 2020-04-10: qty 1

## 2020-04-10 MED ORDER — SODIUM CHLORIDE 0.9 % IV BOLUS
250.0000 mL | Freq: Once | INTRAVENOUS | Status: AC
  Administered 2020-04-10: 250 mL via INTRAVENOUS

## 2020-04-10 MED ORDER — ONDANSETRON HCL 4 MG PO TABS: 4.0000 mg | ORAL_TABLET | Freq: Four times a day (QID) | ORAL | Status: DC | PRN

## 2020-04-10 MED ORDER — TRAZODONE HCL 50 MG PO TABS
50.0000 mg | ORAL_TABLET | Freq: Every day | ORAL | Status: DC
  Administered 2020-04-10 – 2020-04-11 (×2): 50 mg via ORAL
  Filled 2020-04-10 (×2): qty 1

## 2020-04-10 MED ORDER — HYDRALAZINE HCL 10 MG PO TABS
10.0000 mg | ORAL_TABLET | Freq: Four times a day (QID) | ORAL | Status: DC | PRN
Start: 2020-04-10 — End: 2020-04-13

## 2020-04-10 MED ORDER — POLYETHYLENE GLYCOL 3350 17 G PO PACK
17.0000 g | PACK | Freq: Every day | ORAL | Status: DC
  Administered 2020-04-10: 17 g via ORAL
  Filled 2020-04-10 (×2): qty 1

## 2020-04-10 MED ORDER — ONDANSETRON HCL 4 MG/2ML IJ SOLN: 4.0000 mg | Freq: Four times a day (QID) | INTRAMUSCULAR | Status: DC | PRN

## 2020-04-10 MED ORDER — ASPIRIN 81 MG PO CHEW
81.0000 mg | CHEWABLE_TABLET | Freq: Two times a day (BID) | ORAL | Status: DC
  Administered 2020-04-10: 81 mg via ORAL
  Filled 2020-04-10: qty 1

## 2020-04-10 MED ORDER — COLLAGENASE 250 UNIT/GM EX OINT
TOPICAL_OINTMENT | Freq: Every day | CUTANEOUS | Status: DC
  Filled 2020-04-10: qty 30

## 2020-04-10 MED ORDER — SACCHAROMYCES BOULARDII 250 MG PO CAPS
500.0000 mg | ORAL_CAPSULE | Freq: Two times a day (BID) | ORAL | Status: DC
  Administered 2020-04-10 – 2020-04-11 (×3): 500 mg via ORAL
  Filled 2020-04-10 (×4): qty 2

## 2020-04-10 MED ORDER — SIMVASTATIN 20 MG PO TABS
20.0000 mg | ORAL_TABLET | Freq: Every day | ORAL | Status: DC
  Administered 2020-04-10 – 2020-04-11 (×2): 20 mg via ORAL
  Filled 2020-04-10 (×2): qty 1

## 2020-04-10 MED ORDER — MELATONIN 3 MG PO TABS
6.0000 mg | ORAL_TABLET | Freq: Every day | ORAL | Status: DC
  Administered 2020-04-10 – 2020-04-11 (×2): 6 mg via ORAL
  Filled 2020-04-10 (×3): qty 2

## 2020-04-10 MED ORDER — FENOFIBRATE 160 MG PO TABS
160.0000 mg | ORAL_TABLET | Freq: Every day | ORAL | Status: DC
  Administered 2020-04-10 – 2020-04-11 (×2): 160 mg via ORAL
  Filled 2020-04-10 (×4): qty 1

## 2020-04-10 MED ORDER — SODIUM CHLORIDE 0.9 % IV SOLN: INTRAVENOUS | Status: AC

## 2020-04-10 NOTE — ED Notes (Signed)
Date and time results received: 04/10/20 12:32 PM  Test: pO2 and lactic acid Critical Value: pO2<31.0, lactic 2.4  Name of Provider Notified: Hong[place]gi  Orders Received? Or Actions Taken?: provider notified

## 2020-04-10 NOTE — Progress Notes (Addendum)
Progress Note  Patient was evaluated in detail by Dr. Cyd Silence at around 8 AM today.  Kindly refer to his H&P for details.  In summary 80 year old male, resident of SNF currently, multiple medical problems including chronic respiratory failure with hypoxia on 3 L/min [place] oxygen secondary to COPD, CAD s/p CABG, PAD, DM 2, recent right hip arthroplasty, hyperlipidemia, recently diagnosed Covid on 2/1 at SNF (mostly verbal reports and unable to reach SNF, ED RN confirmed with patient's healthcare power of attorney), presented to ED due to AMS/bouts of agitation, variable oxygen saturations.  Admitted for acute metabolic encephalopathy, likely multifactorial due to dehydration, acute kidney injury with associated hyperkalemia and metabolic acidosis in the context of recent COVID-19 infection.  Physical exam:  Vitals:   04/10/20 0952 04/10/20 1006  BP: (!) 132/116 (!) 132/116  Pulse: (!) 101 (!) 108  Resp:  19  Temp:    SpO2:  97%     Patient seen in his room in the ED.  Elderly male, moderately built, thinly nourished, chronically ill looking, lying in right lateral position, fidgety but in no obvious distress.  Oral mucosa-difficult to examine due to lack of cooperation but appears dry.  Reduce skin turgor.  RS with slightly diminished breath sounds in the bases but otherwise clear to auscultation without wheezing or rhonchi.  No increased work of breathing.  CVS: S1 and S2 heard, RRR.  No JVD or murmurs.  No pedal edema.  Telemetry personally reviewed: Mostly tremor artifacts but baseline SR and occasional mild ST.  Abdomen: Nondistended, soft and nontender.  Patient keeps pushing away examiner's hand.  No organomegaly or masses appreciated.  Normal bowel sounds heard.  CNS: Confused, not oriented and not following instructions.  Extremities: Moving all extremities well.  Skin exam: Unable to perform due to lack of cooperation but Arboriculturist input appreciated.  Updated labs:  VBG: pH 7.231, PCO2 42,  PO2 <31.  Bicarbonate 17.1.  Patient saturating in the mid to high 90s on 3 L/min [place] oxygen per ED RN. Potassium 5.9, bicarbonate 15, glucose 151, BUN 55, creatinine 1.32. Folate 5.6, B12: 1275, ESR 67.  TSH normal/0.729. Lactate 2.4. Troponin 23 >25 SARS coronavirus 2 RT-PCR positive. CT head: Limited exam due to patient motion.  No gross acute intracranial abnormalities identified  Assessment and plan:  1. Acute metabolic encephalopathy: Multifactorial due to dehydration, AKI, metabolic/lactic acidosis complicating recent CHENI-77 infection.  Although reported to be baseline oriented x3, unclear as to his baseline mental status if he has underlying cognitive impairment.  Treat underlying causes and monitor closely.  Delirium precautions.  Minimize opioids/sedative use.  CT head, B12, folate, TSH unremarkable.  Check ammonia level during next lab draw.  Continue Depakote sprinkles from prior home meds. 2. Acute kidney injury: Creatinine 0.85 on 1/21, presented with creatinine of 1.29 which is gone up to 1.32.  Likely related to dehydration.  IV fluids and follow BMP. 3. Hyperkalemia: Presented with potassium of 6.2, down to 5.9 after Lokelma.  Due to AKI.  Repeated Lokelma around noon per RN, follow BMP later this evening.  Renal diet.  IVF. 4. Anion gap metabolic acidosis: Presented with bicarb of 11, anion gap of 18.  Bicarbonate has improved to 15, anion gap 15.  Likely due to AKI and lactic acidosis.  IV fluids.  Follow BMP.  Hold Metformin. 5. Lactic acidosis: Suspect due to poor tissue perfusion from dehydration.  IVF and follow lactate.  Hold Metformin. 6. Dehydration: Suspect due to mental  status changes and poor oral intake.  IVF. 7. CAD s/p CABG, elevated troponin: Minimally elevated troponin with flat trends, no angina reported.  Not consistent with ACS.  Continue prior home dose of aspirin, Plavix, metoprolol and statins. 8. DM 2: Holding oral hypoglycemics (glipizide and Metformin).   Recent A1c 6.7 suggesting reasonable control.  SSI. 9. HTN: Uncontrolled.  Continue beta-blockers.  Added as needed oral hydralazine. 10. Chronic respiratory failure with hypoxia/COPD: No clinical bronchospasm.  Back on home level of oxygen.  No CO2 retention by VBG. 11. Recent COVID-19: As per Dr. [name] discussion with SNF, diagnosed at facility on 2/1.  However no documentation from SNF to suggest the same.  ED RN unable to reach SNF for clarification.  No respiratory symptoms.  Not hypoxic on prior home level of oxygen.  Initially not placed on airborne isolation.  SARS coronavirus RT-PCR 2/15: Positive.  I discussed with infectious disease MD, Dr. Baxter Flattery who recommended isolation, placed on airborne/contact isolation. 12. Hyperlipidemia: Statins and fenofibrate. 13. Unstageable sacral pressure ulcer: Evaluation and management as per WOCN consult note from 2/15.  Added air overflow mattress.  I discussed in detail with patient's healthcare power of attorney Mr. Melynda Keller, updated care and answered all questions.  Repeat labs were not back, requested patient's floor RN to have them drawn.  Vernell Leep, MD, [place], [facility]. [facility]  To contact the attending provider between 7A-7P or the covering provider during after hours 7P-7A, please log into the web site [email or web address] and access using universal [place] password for that web site. If you do not have the password, please call the hospital operator.

## 2020-04-10 NOTE — ED Notes (Signed)
Report called and given to nurse.  

## 2020-04-10 NOTE — Consult Note (Signed)
WOC Nurse Consult Note: Reason for Consult: Consult requested for sacrum.  Pt is emaciated with protruding sacrum bone. Wound type: Unstageable pressure injury to the sacrum; approx 1.5X1.5cm in 2 separate areas seperated by a narrow bridge of skin; painful to touch.  Pt is frequently incontinent and it is difficult to keep the location from becoming soiled.   100% yellow slough, surrounded by erythremia to 3 cm surrounding the location.  Pressure Injury POA: Yes Dressing procedure/placement/frequency: Topical treatment orders provided for bedside nurses to perform to assist with enzymatic debridement of nonviable tissue as follows: Apply Santyl to sacrum wounds Q day, then cover with moist 2X2 and foam dressing.  (Change foam dressing Q 3 days or PRN soiling.) Please re-consult if further assistance is needed.  Thank-you,  Cammie Mcgee MSN, RN, CWOCN, [place], CNS [phone]

## 2020-04-10 NOTE — ED Notes (Signed)
Attempted to call [facility] to obtain documentation of pt's covid result. Placed on hold and then line hung up.

## 2020-04-10 NOTE — ED Notes (Signed)
Patient transported to CT 

## 2020-04-10 NOTE — ED Notes (Signed)
Attempted to call and give report, will try again.

## 2020-04-10 NOTE — H&P (Addendum)
History and Physical    Daniel Tyler ZOX:[id number] DOB: [birth year] DOA: 04/09/2020  PCP: Dois Davenport, MD  Patient coming from: [facility] via EMS   Chief Complaint:  Confusion  HPI:    80 year old male with past medical history of chronic respiratory failure (on 3lpm [place]) secondary to COPD with emphysema, coronary artery disease (S/P CABG), peripheral vascular disease (S/P femoral-popliteal bypass and carotid endarterectomy), diabetes mellitus type 2, recent right hip arthroplasty 1/6, hyperlipidemia and recent diagnosis of COVID on 2/1 at patient's skilled nursing facility who presents to [facility] emergency department brought in by EMS due to bouts of agitation, not following commands and variable oxygen saturations.  Patient is a poor historian due to agitation and bouts of infusion and therefore the majority of the history is been obtained per my discussion with skilled nursing facility staff, review of EMS notes and discussion with emergency room staff.  According to skilled nurse facility staff, patient was recently diagnosed with COVID-19 on 2/1.  Since then the patient has been cared for on the Covid unit.  Patient is currently being treated with a course of doxycycline.  Nursing at the SNF reports that in the past 2 days patient has been exhibiting intermittent bouts of hypoxia with oxygen saturation slows the 70s.  Additionally, the same span of time patient has become increasingly agitated and not following commands.  EMS was eventually contacted who promptly came to evaluate the patient.  Upon EMS arrival in assessment, patient exhibited no evidence of hypoxia (was 100% on baseline 3 liters) and did not appear to be in respiratory distress.  Patient was then brought into [facility] emergency department for evaluation.  Upon evaluation in the emergency department patient was found to exhibit a leukocytosis of 14.7, metabolic acidosis,  hyperkalemia with potassium 6.2 without EKG changes and clinical evidence of dehydration.  Patient was hydrated for several hours with intravenous fluids and provided a dose of Lokelma.  Despite this patient still exhibited a persisting mild hyperkalemia and ongoing clinical evidence of dehydration.  Due to perceived need for continued intravenous volume resuscitation and hospitalized setting the hospitalist group was then called to assess the patient for admission to the hospital.  Review of Systems:   Review of Systems  Unable to perform ROS: Mental status change    Past Medical History:  Diagnosis Date  . Anemia   . Carotid stenosis   . Coronary artery disease   . Depression    bc of foot pain but no meds required  . Diabetes mellitus    takes Glipizide and Metformin daily;Lantus nightly  . Enlarged heart   . Hyperlipidemia   . Myocardial infarction (HCC) 2013  . Pneumonia 2013   hx of  . Skin spots-aging   . Weakness of right leg     Past Surgical History:  Procedure Laterality Date  . ABDOMINAL AORTAGRAM    . ABDOMINAL AORTAGRAM N/A 07/09/2012   Procedure: ABDOMINAL Ronny Flurry;  Surgeon: Sherren Kerns, MD;  Location: [facility] CATH LAB;  Service: Cardiovascular;  Laterality: N/A;  . ABDOMINAL AORTOGRAM W/LOWER EXTREMITY N/A 10/19/2017   Procedure: ABDOMINAL AORTOGRAM W/LOWER EXTREMITY;  Surgeon: Cephus Shelling, MD;  Location: [facility] INVASIVE CV LAB;  Service: Cardiovascular;  Laterality: N/A;  . AMPUTATION Right 07/14/2012   Procedure: AMPUTATION DIGIT;  Surgeon: Sherren Kerns, MD;  Location: [facility] OR;  Service: Vascular;  Laterality: Right;  Amputation Right Fourth Toe  . AMPUTATION Right 10/20/2017  Procedure: AMPUTATION DIGIT;  Surgeon: Maeola Harman, MD;  Location: [facility] OR;  Service: Vascular;  Laterality: Right;  . CARDIAC CATHETERIZATION  2013  . CAROTID ENDARTERECTOMY  07/01/11   Right CEA  . COLONOSCOPY    . CORONARY ARTERY BYPASS GRAFT  07/01/2011   Procedure:  CORONARY ARTERY BYPASS GRAFTING (CABG);  Surgeon: Loreli Slot, MD;  Location: [facility] OR;  Service: Open Heart Surgery;  Laterality: N/A;  . ENDARTERECTOMY  07/01/2011   Procedure: ENDARTERECTOMY CAROTID;  Surgeon: Sherren Kerns, MD;  Location: [facility] OR;  Service: Vascular;  Laterality: Right;  . FEMORAL-POPLITEAL BYPASS GRAFT Right 07/14/2012   Procedure: BYPASS GRAFT FEMORAL-POPLITEAL ARTERY;  Surgeon: Sherren Kerns, MD;  Location: [facility] OR;  Service: Vascular;  Laterality: Right;  Right Femoral Popliteal Bypass with Left Leg Nonreversed Greater Saphenous Vein  . INTRAOPERATIVE ARTERIOGRAM Right 07/14/2012   Procedure: INTRA OPERATIVE ARTERIOGRAM;  Surgeon: Sherren Kerns, MD;  Location: [facility] OR;  Service: Vascular;  Laterality: Right;  . LEFT HEART CATHETERIZATION WITH CORONARY ANGIOGRAM N/A 04/24/2011   Procedure: LEFT HEART CATHETERIZATION WITH CORONARY ANGIOGRAM;  Surgeon: Othella Boyer, MD;  Location: [facility] CATH LAB;  Service: Cardiovascular;  Laterality: N/A;  . PERIPHERAL VASCULAR BALLOON ANGIOPLASTY Right 10/19/2017   Procedure: PERIPHERAL VASCULAR BALLOON ANGIOPLASTY;  Surgeon: Cephus Shelling, MD;  Location: [facility] INVASIVE CV LAB;  Service: Cardiovascular;  Laterality: Right;  peroneal  . PERIPHERAL VASCULAR INTERVENTION Right 10/19/2017   Procedure: PERIPHERAL VASCULAR INTERVENTION;  Surgeon: Cephus Shelling, MD;  Location: [facility] INVASIVE CV LAB;  Service: Cardiovascular;  Laterality: Right;  common iliac  . TOTAL HIP ARTHROPLASTY Right 03/01/2020   Procedure: RIGHT TOTAL HIP ARTHROPLASTY ANTERIOR APPROACH;  Surgeon: Samson Frederic, MD;  Location: [facility] OR;  Service: Orthopedics;  Laterality: Right;     reports that he quit smoking about 42 years ago. His smoking use included cigarettes. He has a 20.00 pack-year smoking history. He has never used smokeless tobacco. He reports that he does not drink alcohol and does not use drugs.  No Known Allergies  Family History  Problem Relation  Age of Onset  . Heart disease Father   . Cancer Brother   . Diabetes Brother   . Diabetes Daughter   . Anesthesia problems Neg Hx   . Hypotension Neg Hx   . Malignant hyperthermia Neg Hx   . Pseudochol deficiency Neg Hx      Prior to Admission medications   Medication Sig Start Date End Date Taking? Authorizing Provider  albuterol (VENTOLIN HFA) 108 (90 Base) MCG/ACT inhaler Inhale 2 puffs into the lungs every 6 (six) hours as needed for wheezing or shortness of breath.   Yes [provider]  Amino Acids-Protein Hydrolys (FEEDING SUPPLEMENT, PRO-STAT SUGAR FREE 64,) LIQD Take 30 mLs by mouth in the morning and at bedtime.   Yes [provider]  aspirin 81 MG chewable tablet Chew 1 tablet (81 mg total) by mouth 2 (two) times daily with a meal. Patient taking differently: Chew 81 mg by mouth in the morning and at bedtime. 03/02/20 04/16/20 Yes Barrie Dunker B, PA  cholecalciferol (VITAMIN D3) 25 MCG (1000 UNIT) tablet Take 1,000 Units by mouth daily.   Yes [provider]  clopidogrel (PLAVIX) 75 MG tablet Take 1 tablet by mouth once daily Patient taking differently: Take 75 mg by mouth daily. 11/08/19  Yes Maeola Harman, MD  divalproex (DEPAKOTE SPRINKLE) 125 MG capsule Take 125 mg by mouth 2 (two) times  daily.   Yes [provider]  doxycycline (ADOXA) 100 MG tablet Take 100 mg by mouth 2 (two) times daily.   Yes [provider]  fenofibrate 160 MG tablet Take 160 mg by mouth daily. 06/23/15  Yes [provider]  ferrous gluconate (FERGON) 324 MG tablet Take 324 mg by mouth daily.   Yes [provider]  glipiZIDE (GLUCOTROL XL) 10 MG 24 hr tablet Take 10 mg by mouth daily.   Yes [provider]  magnesium oxide (MAG-OX) 400 (241.3 Mg) MG tablet Take 1 tablet (400 mg total) by mouth 2 (two) times daily. 07/21/15  Yes Dorothea Ogle, MD  melatonin 3 MG TABS tablet Take 6 mg by mouth at bedtime.   Yes [provider]  metFORMIN (GLUCOPHAGE) 1000 MG tablet TAKE ONE TABLET BY MOUTH TWICE DAILY FOR  DIABETES Patient taking differently: Take 1,000 mg by mouth 2 (two) times daily with a meal. 10/21/12  Yes Reed, Tiffany L, DO  metoprolol tartrate (LOPRESSOR) 25 MG tablet Take 12.5 mg by mouth 2 (two) times daily. 03/06/15  Yes [provider]  polyethylene glycol (MIRALAX / GLYCOLAX) 17 g packet Take 17 g by mouth daily.   Yes [provider]  psyllium (REGULOID) 0.52 g capsule Take 1.04 g by mouth in the morning and at bedtime.   Yes [provider]  saccharomyces boulardii (FLORASTOR) 250 MG capsule Take 500 mg by mouth 2 (two) times daily.   Yes [provider]  simvastatin (ZOCOR) 20 MG tablet Take 20 mg by mouth at bedtime. 02/20/20  Yes [provider]  traZODone (DESYREL) 50 MG tablet Take 50 mg by mouth at bedtime.   Yes [provider]  vitamin C (ASCORBIC ACID) 500 MG tablet Take 1,000 mg by mouth daily.   Yes [provider]  Zinc Oxide 22 % CREA Apply 1 application topically See admin instructions. Apply to Diaper/groin area once daily And as needed   Yes [provider]  zinc sulfate 220 (50 Zn) MG capsule Take 220 mg by mouth daily.   Yes [provider]  acetaminophen (TYLENOL) 500 MG tablet Take 1,000 mg by mouth every 4 (four) hours as needed for mild pain.    [provider]  HYDROcodone-acetaminophen (NORCO) 7.5-325 MG tablet Take 1-2 tablets by mouth every 6 (six) hours as needed for severe pain (pain score 7-10). Patient not taking: Reported on 04/09/2020 03/02/20   [name], [place]    Physical Exam: Vitals:   04/10/20 0045 04/10/20 0152 04/10/20 0500 04/10/20 0630  BP: 100/83 128/84 (!) 174/61 (!) 116/97  Pulse: (!) 52 82 87 100  Resp: (!) 26 (!) 22 (!) 31 20  Temp:      TempSrc:      SpO2: 93% 100% 100% 100%  Weight:      Height:        Constitutional: Acute alert and oriented  x2, no associated distress.   Skin: extremely poor skin turgor noted.  Multiple skin tears noted of the bilateral upper extremities with dressings already clean dry and intact.  Additionally, large sacral wound noted that I am unable to assess due to pack dressing already being in place.  Dressing is clean dry and intact.    Eyes: Pupils are equally reactive to light.  No evidence of scleral icterus or conjunctival pallor.  ENMT: Extremely dry mucous membranes noted.  Posterior pharynx clear of any exudate or lesions.  Neck: normal,  supple, no masses, no thyromegaly.  No evidence of jugular venous distension.   Respiratory: Faint bibasilar rales without evidence of wheezing.  No evidence of respiratory distress.  No evidence of accessory muscle use. Cardiovascular: 3-6 systolic murmur noted.  Tachycardic rate with regular rhythm, no murmurs / rubs / gallops. No extremity edema. 2+ pedal pulses. No carotid bruits.  Chest:   Nontender without crepitus or deformity.   Back:   Nontender without crepitus or deformity. Abdomen: Abdomen is soft and nontender.  No evidence of intra-abdominal masses.  Positive bowel sounds noted in all quadrants.   Musculoskeletal: No joint deformity upper and lower extremities. Good ROM, no contractures. Normal muscle tone.  Neurologic: Patient is moving all 4 extremities spontaneously.  Patient is intermittently following commands.  Patient is awake alert and oriented x2.  Sensation is grossly intact.  Patient is responsive to verbal and painful stimuli. Psychiatric: Patient exhibits anxious mood with labile affect.  Patient currently does not seem to possess insight as to his current situation.   Labs on Admission: I have personally reviewed following labs and imaging studies -   CBC: Recent Labs  Lab 04/09/20 2229  WBC 14.7*  NEUTROABS 13.3*  HGB 10.4*  HCT 34.2*  MCV 96.6  PLT 570*   Basic Metabolic Panel: Recent Labs  Lab 04/09/20 2229 04/10/20 0433  NA  135 138  K 6.2* 5.9*  CL 106 108  CO2 11* 15*  GLUCOSE 150* 151*  BUN 54* 55*  CREATININE 1.29* 1.32*  CALCIUM 8.3* 8.0*   GFR: Estimated Creatinine Clearance: 40.8 mL/min (A) (by C-G formula based on SCr of 1.32 mg/dL (H)). Liver Function Tests: No results for input(s): AST, ALT, ALKPHOS, BILITOT, PROT, ALBUMIN in the last 168 hours. No results for input(s): LIPASE, AMYLASE in the last 168 hours. No results for input(s): AMMONIA in the last 168 hours. Coagulation Profile: No results for input(s): INR, PROTIME in the last 168 hours. Cardiac Enzymes: No results for input(s): CKTOTAL, CKMB, CKMBINDEX, TROPONINI in the last 168 hours. BNP (last 3 results) No results for input(s): PROBNP in the last 8760 hours. HbA1C: No results for input(s): HGBA1C in the last 72 hours. CBG: No results for input(s): GLUCAP in the last 168 hours. Lipid Profile: No results for input(s): CHOL, HDL, LDLCALC, TRIG, CHOLHDL, LDLDIRECT in the last 72 hours. Thyroid Function Tests: No results for input(s): TSH, T4TOTAL, FREET4, T3FREE, THYROIDAB in the last 72 hours. Anemia Panel: No results for input(s): VITAMINB12, FOLATE, FERRITIN, TIBC, IRON, RETICCTPCT in the last 72 hours. Urine analysis:    Component Value Date/Time   COLORURINE YELLOW 07/20/2015 2230   APPEARANCEUR CLEAR 07/20/2015 2230   LABSPEC 1.012 07/20/2015 2230   PHURINE 5.0 07/20/2015 2230   GLUCOSEU NEGATIVE 07/20/2015 2230   HGBUR NEGATIVE 07/20/2015 2230   BILIRUBINUR NEGATIVE 07/20/2015 2230   KETONESUR NEGATIVE 07/20/2015 2230   PROTEINUR NEGATIVE 07/20/2015 2230   UROBILINOGEN 1.0 07/13/2012 1445   NITRITE NEGATIVE 07/20/2015 2230   LEUKOCYTESUR NEGATIVE 07/20/2015 2230    Radiological Exams on Admission - Personally Reviewed: DG Chest Portable 1 View  Result Date: 04/09/2020 CLINICAL DATA:  Shortness of breath EXAM: PORTABLE CHEST 1 VIEW COMPARISON:  03/16/2020, 07/13/2012 FINDINGS: Hyperinflation with emphysematous  disease. Chronic bronchitic changes at the bases. Post sternotomy changes. No consolidation or pleural effusion. Minimal ground-glass opacity at the bases. Stable cardiomediastinal silhouette with aortic atherosclerosis. No pneumothorax IMPRESSION: Hyperinflation with emphysematous disease and chronic bronchitic changes at the bases. Minimal ground-glass opacity  at the bases, possible early pneumonia. Electronically Signed   By: Jasmine PangKim  Fujinaga [name]   On: 04/09/2020 22:55    EKG: Personally reviewed.  Rhythm is sinus tachycardia with heart rate of 100 bpm.  Evidence of right bundle branch block.  Evidence of T wave inversions in the inferior leads.    Assessment/Plan Principal Problem:   Acute metabolic encephalopathy   Details as to patient's clinical course in the past several days are limited however it seems the patient has been exhibiting worsening agitation over the past few days compared to his baseline  Per my discussion with the POA, patient is awake alert and oriented x3 and acts appropriately at baseline  In the emergency department patient is visibly agitated and inconsistently following commands as well as being disoriented  Is likely suffering from a degree of acute metabolic encephalopathy secondary to volume depletion from recent COVID infection  Will  hydrate patient with intravenous isotonic fluids in the hopes that patient's agitation and confusion will resolve  Will obtain TSH, folate, vitamin B12, urinalysis and CT imaging of the head to evaluate for other potential causes of confusion  Active Problems:    Hyperkalemia   Patient presenting with potassium of 6.2 without EKG changes  This is in the setting of substantial volume depletion and slightly diminished renal function -likely exacerbated by metabolic acidosis  Patient is already been provided some IV fluids in the emergency department and a dose of Lokelma with persisting mild hyperkalemia  We will continue  patient on intravenous isotonic fluids  Will provide patient an additional dose of Lokelma  We will continue to monitor patient on telemetry  Anticipate complete resolution of hyperkalemia with these measures  Metabolic acidosis   Etiology not completely clear  I suspect this is multifactorial.  Patient is likely suffering from some degree of lactic acidosis from volume depletion in the setting of mild renal insufficiency.    Obtaining VBG and lactic acid level  Monitoring with serial chemistries.    Hypovolemia due to dehydration   Markedly poor skin turgor, dry mucous membranes, mild renal insufficiency with markedly elevated BUN and evidence of hemoconcentration of patient's various labs are all concerning for substantial hypovolemia/dehydration  This likely developed in the setting of Covid infection in the past 2 weeks  Hydrating patient with intravenous isotonic fluids  Monitoring electrolytes with serial chemistries and monitoring for clinical improvement  Encouraging p.o. intake    Coronary artery disease involving native heart without angina pectoris   Patient is currently chest pain-free  Continue home regimen of antiplatelet therapy  Continue home regimen statin therapy  Continue home regimen of beta-blocker therapy  Monitoring patient on telemetry    Type 2 diabetes mellitus with complication, without long-term current use of insulin (HCC)    Holding home regimen of oral hypoglycemics  Accu-Cheks before every meal and nightly with sliding scale insulin  Most recent hemoglobin A1c in January was 6.7% suggestive of reasonable control    Essential hypertension   Continue home regimen of beta-blocker therapy  Monitor for episodic hypotension considering concurrent hypokalemia    COPD (chronic obstructive pulmonary disease) with emphysema (HCC)   No clinical evidence of COPD exacerbation or hypoxia  As needed bronchodilator therapy for bouts  of shortness of breath and wheezing.    History of COVID-19   Recent diagnosis of COVID-19 outpatient skilled nursing facility on 2/1 per my discussion with nursing on the phone  Strangely now, review of skilled nursing facility  documentation that came with the patient states that patient was tested for Covid on 1/17 and 1/28 both of which were negative but there is no mention of the 2/1 test.  Regardless, patient is currently not exhibiting any respiratory symptoms and saturating well on my assessment.  Patient is well past the 10-day window of isolation and I do not believe a 21-day window of isolation is indicated here.  That being said, patient's presentation with substantial volume depletion is likely secondary to his battle with COVID-19 over the past several weeks.    Mixed hyperlipidemia due to type 2 diabetes mellitus (HCC)   Continue home regimen of statin therapy    Sacral wound   Dressing is clean dry and intact on my exam  Will place order for wound care consultation    Chronic respiratory failure with hypoxia (HCC)    Longstanding known history of chronic respiratory failure on 3 L of oxygen at baseline  Chronic respiratory failure secondary to underlying COPD and emphysema.  Patient is currently saturating well on 3 L of oxygen   Code Status:  DNR Family Communication: Discussed patient's care with Venia Carbon patient's POA  Status is: Observation  The patient remains OBS appropriate and will d/c before 2 midnights.  Dispo: The patient is from: SNF              Anticipated d/c is to: SNF              Anticipated d/c date is: 2 days              Patient currently is not medically stable to d/c.   Difficult to place patient No        Marinda Elk MD [facility] Pager [phone]  If 7PM-7AM, please contact night-coverage [email or web address] Use universal [place] password for that web site. If you do not have the password, please call  the hospital operator.  04/10/2020, 7:49 AM

## 2020-04-10 NOTE — ED Provider Notes (Signed)
I assumed care of this patient.  Please see previous provider note for further details of Hx, PE.  Briefly patient is a 80 y.o. male who presented for reported hypoxia at his facility but found to be satting well on his normal 3 L nasal cannula.  Screening labs were obtained due to decreased p.o. intake and prior history of hyponatremia.  .  Initial labs showed leukocytosis which was contributed to hemoconcentration given the rest of the CBC findings.  Metabolic panel notable for mild AKI with hyperkalemia.  Troponin was also slightly bumped. plan was to hydrate patient with IV fluids and lokelma and recheck labs.  After 1 L of IV fluids and lokelma, troponins stable and flat.  Metabolic panel showed mildly improved hyperkalemia but stable renal insufficiency. Will need additional IVF. CXR did reveal bibasilar opacities. Patient already on Doxy.  Will discuss with medicine for admission.    Nira Conn, MD 04/10/20 [phone]

## 2020-04-11 LAB — COMPREHENSIVE METABOLIC PANEL
ALT: 14 U/L (ref 0–44)
AST: 31 U/L (ref 15–41)
Albumin: 2.6 g/dL — ABNORMAL LOW (ref 3.5–5.0)
Alkaline Phosphatase: 43 U/L (ref 38–126)
Anion gap: 11 (ref 5–15)
BUN: 52 mg/dL — ABNORMAL HIGH (ref 8–23)
CO2: 17 mmol/L — ABNORMAL LOW (ref 22–32)
Calcium: 8 mg/dL — ABNORMAL LOW (ref 8.9–10.3)
Chloride: 116 mmol/L — ABNORMAL HIGH (ref 98–111)
Creatinine, Ser: 0.86 mg/dL (ref 0.61–1.24)
GFR, Estimated: >60 mL/min (ref >60)
Glucose, Bld: 180 mg/dL — ABNORMAL HIGH (ref 70–99)
Potassium: 4.5 mmol/L (ref 3.5–5.1)
Sodium: 144 mmol/L (ref 135–145)
Total Bilirubin: 1 mg/dL (ref 0.3–1.2)
Total Protein: 5.9 g/dL — ABNORMAL LOW (ref 6.5–8.1)

## 2020-04-11 LAB — CBC WITH DIFFERENTIAL/PLATELET
Abs Immature Granulocytes: 0.08 10*3/uL — ABNORMAL HIGH (ref 0.00–0.07)
Basophils Absolute: 0 10*3/uL (ref 0.0–0.1)
Basophils Relative: 0 %
Eosinophils Absolute: 0 10*3/uL (ref 0.0–0.5)
Eosinophils Relative: 0 %
HCT: 26.9 % — ABNORMAL LOW (ref 39.0–52.0)
Hemoglobin: 8.1 g/dL — ABNORMAL LOW (ref 13.0–17.0)
Immature Granulocytes: 1 %
Lymphocytes Relative: 4 %
Lymphs Abs: 0.6 10*3/uL — ABNORMAL LOW (ref 0.7–4.0)
MCH: 29.3 pg (ref 26.0–34.0)
MCHC: 30.1 g/dL (ref 30.0–36.0)
MCV: 97.5 fL (ref 80.0–100.0)
Monocytes Absolute: 0.7 10*3/uL (ref 0.1–1.0)
Monocytes Relative: 5 %
Neutro Abs: 12.1 10*3/uL — ABNORMAL HIGH (ref 1.7–7.7)
Neutrophils Relative %: 90 %
Platelets: 431 10*3/uL — ABNORMAL HIGH (ref 150–400)
RBC: 2.76 MIL/uL — ABNORMAL LOW (ref 4.22–5.81)
RDW: 16.5 % — ABNORMAL HIGH (ref 11.5–15.5)
WBC: 13.5 10*3/uL — ABNORMAL HIGH (ref 4.0–10.5)
nRBC: 0 % (ref 0.0–0.2)

## 2020-04-11 LAB — GLUCOSE, CAPILLARY
Glucose-Capillary: 155 mg/dL — ABNORMAL HIGH (ref 70–99)
Glucose-Capillary: 169 mg/dL — ABNORMAL HIGH (ref 70–99)
Glucose-Capillary: 184 mg/dL — ABNORMAL HIGH (ref 70–99)
Glucose-Capillary: 143 mg/dL — ABNORMAL HIGH (ref 70–99)

## 2020-04-11 LAB — MAGNESIUM: Magnesium: 2.7 mg/dL — ABNORMAL HIGH (ref 1.7–2.4)

## 2020-04-11 MED ORDER — SODIUM CHLORIDE 0.9 % IV SOLN: INTRAVENOUS | Status: DC

## 2020-04-11 NOTE — Evaluation (Signed)
Physical Therapy Evaluation Patient Details Name: Daniel Tyler MRN: [id number] DOB: [birth year] Today's Date: 04/11/2020   History of Present Illness  80 year old male with past medical history of chronic respiratory failure (on 3lpm [place]) secondary to COPD with emphysema, coronary artery disease (S/P CABG), peripheral vascular disease (S/P femoral-popliteal bypass and carotid endarterectomy), diabetes mellitus type 2, recent right hip arthroplasty 1/6, hyperlipidemia and recent diagnosis of COVID on 2/1 at patient's skilled nursing facility who presents to [facility] emergency department brought in by EMS due to bouts of agitation, not following commands and variable oxygen saturations.  Clinical Impression  Patient is very restless, asking for water> Unable to safely attempt sitting on bed edge as patient  Resists, holds rails, having to pry hands  From rail in order to roll.   Noted patient at SNF since hip fracture in January 2022. Unsure if ambulating at SNF. Noted with sacral redness and sore. Pt admitted with above diagnosis.  Pt currently with functional limitations due to the deficits listed below (see PT Problem List). Pt will benefit from skilled PT to increase their independence and safety with mobility to allow discharge to the venue listed below.     Follow Up Recommendations SNF    Equipment Recommendations  None recommended by PT    Recommendations for Other Services       Precautions / Restrictions Precautions Precautions: Fall      Mobility  Bed Mobility Overal bed mobility: Needs Assistance Bed Mobility: Rolling Rolling: Total assist;+2 for physical assistance;+2 for safety/equipment         General bed mobility comments: difficult to roll, patient gripping bed rails, having to pry hands off to successfully roll.    Transfers                 General transfer comment: NT  Ambulation/Gait                Stairs             Wheelchair Mobility    Modified Rankin (Stroke Patients Only)       Balance                                             Pertinent Vitals/Pain Pain Assessment: Faces Faces Pain Scale: Hurts little more Pain Location: when rolling and repositioning, moans Pain Descriptors / Indicators: Moaning Pain Intervention(s): Monitored during session;Limited activity within patient's tolerance    Home Living Family/patient expects to be discharged to:: Skilled nursing facility                      Prior Function           Comments: unsure, has redness on buttocks  and sacral sore     Hand Dominance        Extremity/Trunk Assessment   Upper Extremity Assessment Upper Extremity Assessment: Generalized weakness    Lower Extremity Assessment Lower Extremity Assessment: Difficult to assess due to impaired cognition (holds legs rigid when rolling.)    Cervical / Trunk Assessment Cervical / Trunk Assessment: Other exceptions Cervical / Trunk Exceptions: NT, unable to sit up, IV RN in , pt. too restless  Communication   Communication:  (rambling speech, at times can express need)  Cognition Arousal/Alertness: Awake/alert Behavior During Therapy: Restless Overall Cognitive Status: No family/caregiver present to determine  baseline cognitive functioning                                 General Comments: very restless, asking for water with no ice, constantly moving in bed.      General Comments General comments (skin integrity, edema, etc.): TBA    Exercises     Assessment/Plan    PT Assessment Patient needs continued PT services  PT Problem List Decreased strength;Decreased mobility;Decreased safety awareness;Decreased activity tolerance;Decreased cognition       PT Treatment Interventions Gait training;DME instruction;Functional mobility training;Therapeutic activities;Cognitive remediation;Patient/family education    PT  Goals (Current goals can be found in the Care Plan section)  Acute Rehab PT Goals PT Goal Formulation: Patient unable to participate in goal setting Time For Goal Achievement: 04/25/20 Potential to Achieve Goals: Poor    Frequency Min 2X/week (trial)   Barriers to discharge        Co-evaluation               AM-PAC PT "6 Clicks" Mobility  Outcome Measure Help needed turning from your back to your side while in a flat bed without using bedrails?: Total Help needed moving from lying on your back to sitting on the side of a flat bed without using bedrails?: Total Help needed moving to and from a bed to a chair (including a wheelchair)?: Total Help needed standing up from a chair using your arms (e.g., wheelchair or bedside chair)?: Total Help needed to walk in hospital room?: Total Help needed climbing 3-5 steps with a railing? : Total 6 Click Score: 6    End of Session Equipment Utilized During Treatment: Oxygen Activity Tolerance: Treatment limited secondary to agitation Patient left: in bed;with nursing/sitter in room Nurse Communication: Mobility status PT Visit Diagnosis: Repeated falls (R29.6)    Time: 0277-4128 PT Time Calculation (min) (ACUTE ONLY): 32 min   Charges:   PT Evaluation $PT Eval Low Complexity: 1 Low PT Treatments $Therapeutic Activity: 8-22 mins        Blanchard Kelch PT Acute Rehabilitation Services Pager [phone] Office [phone]   Rada Hay 04/11/2020, 10:05 AM

## 2020-04-11 NOTE — Significant Event (Signed)
Discussed with patient's friend and power of attorney Mr. Venia Carbon this afternoon. He knows Mr. Lund for several years.  He has seen progressive decline over the months and especially in the last 2 months since the fracture and Covid infection. He is not excited about the care at [facility] and does not see a hope with the return to the facility.  He inquired about residential hospice. Based on patient's current frailty and poor prognosis, I agree with seeking hospice care. Social work consulted for residential hospice placement.

## 2020-04-11 NOTE — Progress Notes (Signed)
PROGRESS NOTE  Daniel Tyler  DOB: 03-22-1940  PCP: Dois Davenport, MD FBP:[id number]  DOA: 04/09/2020  LOS: 1 day   Chief Complaint  Patient presents with  . Sore    Brief narrative: Daniel Tyler is a 80 y.o. male with PMH significant for DM2, HLD, CAD s/p CABG, COPD on 3 L oxygen, peripheral artery disease s/p femoral-popliteal bypass, carotid artery stenosis s/p CEA, recent right hip arthroplasty 1/6, who was recently diagnosed with COVID on 2/1 at the SNF, [facility].  He was started on doxycycline. 2/14, EMS was called with complaint of intermittent bouts of agitation, unable to follow commands and  variable oxygen saturations.  EMS did not find any evidence of hypoxia on his baseline 3 L and he did not appear in respiratory distress either. But he was brought to the ED for other nonspecific complaints.  In the ED, patient was afebrile, tachycardic to 105, blood pressure in low 100s O2 sat 93% on 3 L. Labs showed WBC count elevated to 14.7, hemoglobin low at 10.4, potassium elevated to 6.2, metabolic acidosis with serum bicarb low at 11, BUN/creatinine 54/1.29, lactic acid was elevated to 2.4 EKG without hyperkalemic changes Patient was started on IV hydration, given Lokelma. In excess of hours, blood gas showed acidotic pH at 7.23 with PCO2 normal at 42 and bicarbonate level low at 17 Admitted to hospitalist service for further evaluation management.   Subjective: Patient was seen and examined this morning.  Frail chronically sick looking elderly Caucasian male.  Lying in bed.  Mumbling.  Knows he is in the hospital.  Unable to have a meaningful conversation otherwise Chart reviewed. Labs from this morning shows improvement in creatinine, lactic acid and potassium level, WBC count trending down as well.  Assessment/Plan: Acute metabolic encephalopathy -Sent from SNF for intermittent bouts of confusion, agitation.   -Likely secondary to dehydration, metabolic  on top of Covid infection.   -TSH normal -CT head did not show any acute intracranial abnormality but showed small vessel chronic ischemic changes.  AKI Metabolic acidosis -Likely due to poor oral intake.  Encourage oral intake.   -Lab trend as below.  Both creatinine and serum bicarb levels show improvement with hydration. -Continue IV hydration with normal saline at 75 mill per hour Recent Labs    03/03/20 0448 03/04/20 0801 03/05/20 1306 03/06/20 1213 03/15/20 1406 03/16/20 1430 04/09/20 2229 04/10/20 0433 04/10/20 1816 04/11/20 0355  BUN 41* 31* 28* 21 26* 19 54* 55* 54* 52*  CREATININE 2.09* 1.50* 1.21 1.07 1.03 0.85 1.29* 1.32* 1.06 0.86  CO2 21* 20* 21* 20* 23 17* 11* 15* 17* 17*   Hyperkalemia -Potassium level is elevated to 6.2 without EKG changes.  Improved with Lokelma. Recent Labs  Lab 04/09/20 2229 04/10/20 0433 04/10/20 1816 04/11/20 0355  K 6.2* 5.9* 4.9 4.5  MG  --   --   --  2.7*   Recent Covid infection COPD chronic respiratory failure with hypoxia -First diagnosed on 2/1.  Remains Covid positive on 2/15 -Chest x-ray on admission showed minimal groundglass opacities at the bases and emphysematous changes. -Currently on 3 L oxygen which is his baseline. -Continue bronchodilators. -Continue to monitor. Recent Labs  Lab 04/09/20 2229 04/10/20 0528 04/10/20 1151 04/10/20 2036 04/11/20 0355  SARSCOV2NAA  --  POSITIVE*  --   --   --   WBC 14.7*  --   --   --  13.5*  LATICACIDVEN  --   --  2.4* 1.4  --   ALT  --   --   --   --  14   Type 2 diabetes mellitus -A1c 6.7 on 02/29/20 -Home meds include glipizide 10 mg daily, Metformin 1000 mg twice daily -Keep oral meds on hold.  Continue sliding scale insulin with Accu-Cheks. Recent Labs  Lab 04/10/20 1156 04/10/20 1706 04/10/20 2151 04/11/20 0825 04/11/20 1124  GLUCAP 126* 115* 157* 155* 169*   CAD s/p CABG Peripheral artery disease s/p femoral-popliteal bypass Carotid artery stenosis s/p  CEA Hyperlipidemia -Home meds include aspirin 81 mg daily, Plavix 75 mg daily,, simvastatin 20 mg daily, Lopressor 12.5 mg twice daily -Continue all.  Sacral wound -POA -Wound care consulted. -Patient is unstageable pressure injury to the sacrum, painful to touch.  Is frequently incontinent.  Local wound care recommended.  Mobility: PT eval Code Status:   Code Status: DNR  Nutritional status: Body mass index is 20.67 kg/m.     Diet Order            DIET - DYS 1 Room service appropriate? No; Fluid consistency: Thin  Diet effective now                 DVT prophylaxis: enoxaparin (LOVENOX) injection 40 mg Start: 04/10/20 1000   Antimicrobials:  None Fluid: Normal saline at 75 mill per hour. Consultants: None Family Communication:  Called and left a message to contact information listed in the chart for Mr. Venia Carbon  Status is: Inpatient  Remains inpatient appropriate because: Needs further IV hydration, reorientation, long-term plan identified  Dispo: The patient is from: SNF              Anticipated d/c is to: SNF              Anticipated d/c date is: 2 days              Patient currently is not medically stable to d/c.   Difficult to place patient No       Infusions:  . sodium chloride 75 mL/hr at 04/11/20 1007    Scheduled Meds: . aspirin  81 mg Oral BID WC  . clopidogrel  75 mg Oral Daily  . collagenase   Topical Daily  . divalproex  125 mg Oral BID  . doxycycline  100 mg Oral BID  . enoxaparin (LOVENOX) injection  40 mg Subcutaneous Q24H  . fenofibrate  160 mg Oral Daily  . ferrous gluconate  324 mg Oral QAC breakfast  . insulin aspart  0-15 Units Subcutaneous TID AC & HS  . melatonin  6 mg Oral QHS  . metoprolol tartrate  12.5 mg Oral BID  . polyethylene glycol  17 g Oral Daily  . psyllium  1 packet Oral BID  . saccharomyces boulardii  500 mg Oral BID  . simvastatin  20 mg Oral QHS  . traZODone  50 mg Oral QHS     Antimicrobials: Anti-infectives (From admission, onward)   Start     Dose/Rate Route Frequency Ordered Stop   04/10/20 1000  doxycycline (VIBRA-TABS) tablet 100 mg        100 mg Oral 2 times daily 04/10/20 0742 04/12/20 0959      PRN meds: acetaminophen **OR** acetaminophen, hydrALAZINE, ipratropium-albuterol, ondansetron **OR** ondansetron (ZOFRAN) IV, polyethylene glycol   Objective: Vitals:   04/11/20 0200 04/11/20 0608  BP: (!) 133/59 (!) 134/56  Pulse: 90 83  Resp: 20 20  Temp: 97.7 F (36.5 C) 97.7  F (36.5 C)  SpO2: 91% 100%    Intake/Output Summary (Last 24 hours) at 04/11/2020 1359 Last data filed at 04/11/2020 0600 Gross per 24 hour  Intake 270 ml  Output 450 ml  Net -180 ml   Filed Weights   04/09/20 2126  Weight: 63.5 kg   Weight change:  Body mass index is 20.67 kg/m.   Physical Exam: General exam: Pleasant elderly Caucasian male.  Not in distress, mumbling Skin: No rashes, lesions or ulcers.  Multiple areas of petechiae and bruises HEENT: Atraumatic, normocephalic, no obvious bleeding Lungs: Clear to auscultation bilaterally CVS: Regular rate and rhythm, no murmur GI/Abd soft, nontender, nondistended, bowel sound present CNS: Alert, awake, oriented to place Psychiatry: Depressed look Extremities: No pedal edema, no calf tenderness  Data Review: I have personally reviewed the laboratory data and studies available.  Recent Labs  Lab 04/09/20 2229 04/11/20 0355  WBC 14.7* 13.5*  NEUTROABS 13.3* 12.1*  HGB 10.4* 8.1*  HCT 34.2* 26.9*  MCV 96.6 97.5  PLT 570* 431*   Recent Labs  Lab 04/09/20 2229 04/10/20 0433 04/10/20 1816 04/11/20 0355  NA 135 138 143 144  K 6.2* 5.9* 4.9 4.5  CL 106 108 115* 116*  CO2 11* 15* 17* 17*  GLUCOSE 150* 151* 141* 180*  BUN 54* 55* 54* 52*  CREATININE 1.29* 1.32* 1.06 0.86  CALCIUM 8.3* 8.0* 7.7* 8.0*  MG  --   --   --  2.7*    F/u labs ordered Unresulted Labs (From admission, onward)           Start     Ordered   04/12/20 0500  CBC  Daily,   R      04/11/20 1359   04/12/20 0500  Basic metabolic panel  Daily,   R      04/11/20 1359   04/10/20 0759  Urinalysis, Complete w Microscopic  Once,   STAT        04/10/20 0758          Signed, Lorin Glass, MD [facility] 04/11/2020

## 2020-04-11 NOTE — TOC Initial Note (Signed)
Transition of Care [facility]) - Initial/Assessment Note    Patient Details  Name: Daniel Tyler MRN: [id number] Date of Birth: [birth year]  Transition of [facility] CM/SW Contact:    Ida Rogue, LCSW Phone Number: 04/11/2020, 3:38 PM  Clinical Narrative:  Son called in follow up to PT recommendation of SNF.  Daniel Tyler states he has been unhappy with care at [facility], is hoping his father could go to a different facility.  I let him know the challenge of COVID beds, and he voiced understanding, but asked that I try anyway.  Received message from MD stating he talked with Daniel Tyler about residential hospice referral for patient, and tthey are in agreement.  Called Cheri with [facility] to make referral for COVID positive patient. TOC will continue to follow during the course of hospitalization.                  Expected Discharge Plan: Hospice Medical Facility Barriers to Discharge: Other (comment) (Covid hospice bed)   Patient Goals and CMS Choice     Choice offered to / list presented to : Adult Children  Expected Discharge Plan and Services Expected Discharge Plan: Hospice Medical Facility     Post Acute Care Choice: Hospice Living arrangements for the past 2 months: Skilled Nursing Facility                                      Prior Living Arrangements/Services Living arrangements for the past 2 months: Skilled Nursing Facility Lives with:: Facility Resident                   Activities of Daily Living Home Assistive Devices/Equipment: Blood pressure cuff,CBG Meter,Hospital bed,Grab bars around toilet,Grab bars in shower,Hand-held shower hose,Walker (specify type),Wheelchair,Scales (front wheeled walker) ADL Screening (condition at time of admission) Patient's cognitive ability adequate to safely complete daily activities?: No Is the patient deaf or have difficulty hearing?: No Does the patient have difficulty seeing, even when wearing  glasses/contacts?: No Does the patient have difficulty concentrating, remembering, or making decisions?: Yes Patient able to express need for assistance with ADLs?: Yes Does the patient have difficulty dressing or bathing?: Yes Independently performs ADLs?: No Communication: Independent Dressing (OT): Needs assistance Is this a change from baseline?: Pre-admission baseline Grooming: Needs assistance Is this a change from baseline?: Pre-admission baseline Feeding: Needs assistance Is this a change from baseline?: Pre-admission baseline Bathing: Needs assistance Is this a change from baseline?: Pre-admission baseline Toileting: Needs assistance Is this a change from baseline?: Pre-admission baseline In/Out Bed: Needs assistance Is this a change from baseline?: Pre-admission baseline Walks in Home: Needs assistance Is this a change from baseline?: Pre-admission baseline Does the patient have difficulty walking or climbing stairs?: Yes (secondary to weakness) Weakness of Legs: Both Weakness of Arms/Hands: None  Permission Sought/Granted Permission sought to share information with : Family Supports Permission granted to share information with : No  Share Information with NAME: Daniel Tyler (Other)   [phone]           Emotional Assessment       Orientation: : Oriented to Self Alcohol / Substance Use: Not Applicable Psych Involvement: No (comment)  Admission diagnosis:  Hyperkalemia [E87.5] Acute metabolic encephalopathy [G93.41] Patient Active Problem List   Diagnosis Date Noted  . Hyperkalemia 04/10/2020  . History of COVID-19 04/10/2020  . Acute metabolic encephalopathy 04/10/2020  .  Mixed hyperlipidemia due to type 2 diabetes mellitus (HCC) 04/10/2020  . Hypovolemia due to dehydration 04/10/2020  . Sacral wound 04/10/2020  . Chronic respiratory failure with hypoxia (HCC) 04/10/2020  . Metabolic acidosis 04/10/2020  . Hip fracture (HCC) 02/29/2020  . Closed right  hip fracture, initial encounter (HCC) 02/29/2020  . Anxiety 12/08/2017  . Hyperlipidemia 12/08/2017  . Malnutrition of moderate degree 10/18/2017  . Gangrene of toe of right foot (HCC) 10/16/2017  . Cellulitis of great toe of right foot 10/16/2017  . Osteomyelitis of great toe of right foot (HCC) 10/16/2017  . Uncontrolled type 2 diabetes mellitus with peripheral neuropathy (HCC) 06/10/2016  . Skin lesion 11/30/2015  . Anemia 09/15/2015  . Right foot ulcer (HCC) 09/15/2015  . Renal insufficiency 09/15/2015  . Syncope 07/20/2015  . Aftercare following surgery of the circulatory system, NEC 09/09/2012  . Carotid artery stenosis 09/09/2012  . Peripheral vascular disease, unspecified (HCC) 07/29/2012  . Atherosclerosis of native arteries of the extremities with ulceration (HCC) 07/08/2012  . Type II or unspecified type diabetes mellitus with peripheral circulatory disorders, uncontrolled(250.72) 07/08/2012  . Diabetic foot ulcer (HCC) 06/26/2012  . Occlusion and stenosis of carotid artery without mention of cerebral infarction 07/24/2011  . Essential hypertension   . COPD (chronic obstructive pulmonary disease) with emphysema (HCC)   . Anemia due to blood loss, acute   . S/P CABG (coronary artery bypass graft) 07/01/2011  . Carotid artery disease (HCC) 07/01/2011  . Disorder of arteries and arterioles (HCC) 07/01/2011  . Depression 06/07/2011  . Dyslipidemia   . Coronary artery disease involving native heart without angina pectoris 04/24/2011  . Type 2 diabetes mellitus with complication, without long-term current use of insulin (HCC)    PCP:  Dois Davenport, MD Pharmacy:   [facility] [address] ([address]), [place] [address] [place] ([place]) [place] [address] Phone: [phone] Fax: [phone]  [facility] [place], [place] - [address] [place] [address] Phone: [phone] Fax: [phone]     Social  Determinants of Health (SDOH) Interventions    Readmission Risk Interventions No flowsheet data found.

## 2020-04-12 LAB — CBC
HCT: 24.1 % — ABNORMAL LOW (ref 39.0–52.0)
Hemoglobin: 7.4 g/dL — ABNORMAL LOW (ref 13.0–17.0)
MCH: 29.1 pg (ref 26.0–34.0)
MCHC: 30.7 g/dL (ref 30.0–36.0)
MCV: 94.9 fL (ref 80.0–100.0)
Platelets: 371 10*3/uL (ref 150–400)
RBC: 2.54 MIL/uL — ABNORMAL LOW (ref 4.22–5.81)
RDW: 16.5 % — ABNORMAL HIGH (ref 11.5–15.5)
WBC: 14 10*3/uL — ABNORMAL HIGH (ref 4.0–10.5)
nRBC: 0 % (ref 0.0–0.2)

## 2020-04-12 LAB — GLUCOSE, CAPILLARY
Glucose-Capillary: 150 mg/dL — ABNORMAL HIGH (ref 70–99)
Glucose-Capillary: 151 mg/dL — ABNORMAL HIGH (ref 70–99)
Glucose-Capillary: 156 mg/dL — ABNORMAL HIGH (ref 70–99)
Glucose-Capillary: 158 mg/dL — ABNORMAL HIGH (ref 70–99)

## 2020-04-12 LAB — BASIC METABOLIC PANEL
Anion gap: 11 (ref 5–15)
BUN: 40 mg/dL — ABNORMAL HIGH (ref 8–23)
CO2: 18 mmol/L — ABNORMAL LOW (ref 22–32)
Calcium: 8 mg/dL — ABNORMAL LOW (ref 8.9–10.3)
Chloride: 120 mmol/L — ABNORMAL HIGH (ref 98–111)
Creatinine, Ser: 0.79 mg/dL (ref 0.61–1.24)
GFR, Estimated: >60 mL/min (ref >60)
Glucose, Bld: 151 mg/dL — ABNORMAL HIGH (ref 70–99)
Potassium: 4.4 mmol/L (ref 3.5–5.1)
Sodium: 149 mmol/L — ABNORMAL HIGH (ref 135–145)

## 2020-04-12 MED ORDER — LIP MEDEX EX OINT
TOPICAL_OINTMENT | CUTANEOUS | Status: DC | PRN
  Filled 2020-04-12: qty 7

## 2020-04-12 MED ORDER — LORAZEPAM 2 MG/ML PO CONC
1.0000 mg | ORAL | Status: DC | PRN
  Filled 2020-04-12: qty 0.5

## 2020-04-12 MED ORDER — MORPHINE SULFATE (PF) 2 MG/ML IV SOLN
1.0000 mg | INTRAVENOUS | Status: DC | PRN
  Administered 2020-04-12 – 2020-04-13 (×2): 1 mg via INTRAVENOUS
  Filled 2020-04-12: qty 1

## 2020-04-12 MED ORDER — LORAZEPAM 1 MG PO TABS: 1.0000 mg | ORAL_TABLET | ORAL | Status: DC | PRN

## 2020-04-12 MED ORDER — MORPHINE SULFATE (PF) 2 MG/ML IV SOLN
1.0000 mg | INTRAVENOUS | Status: DC | PRN
  Filled 2020-04-12: qty 1

## 2020-04-12 MED ORDER — MORPHINE SULFATE (CONCENTRATE) 10 MG/0.5ML PO SOLN: 5.0000 mg | ORAL | Status: DC | PRN

## 2020-04-12 MED ORDER — LORAZEPAM 2 MG/ML IJ SOLN: 1.0000 mg | INTRAMUSCULAR | Status: DC | PRN

## 2020-04-12 MED ORDER — MORPHINE SULFATE (CONCENTRATE) 10 MG/0.5ML PO SOLN
5.0000 mg | ORAL | Status: DC | PRN
Start: 2020-04-12 — End: 2020-04-13

## 2020-04-12 MED ORDER — LORAZEPAM 2 MG/ML PO CONC
1.0000 mg | ORAL | Status: DC | PRN
Start: 2020-04-12 — End: 2020-04-13
  Filled 2020-04-12: qty 0.5

## 2020-04-12 MED ORDER — LORAZEPAM 2 MG/ML IJ SOLN
1.0000 mg | INTRAMUSCULAR | Status: DC | PRN
Start: 2020-04-12 — End: 2020-04-13
  Administered 2020-04-12 – 2020-04-13 (×4): 1 mg via INTRAVENOUS
  Filled 2020-04-12 (×4): qty 1

## 2020-04-12 NOTE — Consult Note (Addendum)
WOC Nurse wound follow up Initial consult performed on 2/15 for sacrum; refer to previous notes.  Requested today to assess arms, legs and buttocks.  Pt is in isolation for Covid and is immobile and very emaciated with multiple systemic factors which can impair healing.  Wound type: Left anterior/posterior shoulder with full thickness abrasion; 4X4X.2cm, yellow and moist, small amt yellow drainage Left anterior shin with partial thickness abrasion; 3X1X.1cm and 1X1X/1cm, both are red and moist without drainage Left hip with .2X.2X.1cm full thickness lesion of unknown etiology, dark brown dry wound bed without drainage.  There are 3 more lesions of this same appearance to inner buttocks; each is approx .2X.2X.1cm, dry dark brown Bilat arms and legs with dry scabbed abrasions to multiple scattered areas and multiple dry scaly lesions of unknown etiology.  Dressing procedure/placement/frequency: Topical treatment orders provided for bedside nurses to perform as follows: Foam dressing to left shoulder, left hip, left leg. Change Q 3 days or PRN soiling.  Apply Santyl to sacrum wounds and inner buttock wounds Q day, then cover with moist 2X2 and foam dressing.  (Change foam dressing Q 3 days or PRN soiling.) Please re-consult if further assistance is needed.  Thank-you,  Cammie Mcgee MSN, RN, CWOCN, [place], CNS [phone]

## 2020-04-12 NOTE — Progress Notes (Signed)
PROGRESS NOTE  Daniel Tyler  DOB: [birth year]  PCP: Daniel Davenport, MD YQM:[id number]  DOA: 04/09/2020  LOS: 2 days   Chief Complaint  Patient presents with  . Sore    Brief narrative: Daniel Tyler is a 80 y.o. male with PMH significant for DM2, HLD, CAD s/p CABG, COPD on 3 L oxygen, peripheral artery disease s/p femoral-popliteal bypass, carotid artery stenosis s/p CEA, recent right hip arthroplasty 1/6, who was recently diagnosed with COVID on 2/1 at the SNF, [facility].  He was started on doxycycline. 2/14, EMS was called with complaint of intermittent bouts of agitation, unable to follow commands and  variable oxygen saturations.  EMS did not find any evidence of hypoxia on his baseline 3 L and he did not appear in respiratory distress either. But he was brought to the ED for other nonspecific complaints.  In the ED, patient was afebrile, tachycardic to 105, blood pressure in low 100s O2 sat 93% on 3 L. Labs showed WBC count elevated to 14.7, hemoglobin low at 10.4, potassium elevated to 6.2, metabolic acidosis with serum bicarb low at 11, BUN/creatinine 54/1.29, lactic acid was elevated to 2.4 EKG without hyperkalemic changes Patient was started on IV hydration, given Lokelma. In excess of hours, blood gas showed acidotic pH at 7.23 with PCO2 normal at 42 and bicarbonate level low at 17 Admitted to hospitalist service for further evaluation management.   2/16, I discussed with patient's friend and power of attorney [name]. He knows Mr. Spagnuolo for several years. He has seen the patient progressively decline over the months and especially in the last 2 months since the hip fracture and Covid infection. He is not excited about the care at [facility] and does not see a hope with the return to the facility.  He inquired about residential hospice. Based on patient's current frailty and poor prognosis, I agree with seeking hospice care. Social work consulted for  residential hospice placement. Currently on comfort care measures.  Subjective: Patient was seen and examined this morning.  Frail, chronically sick.  Open eyes on verbal command.  Comfortable.   Assessment/Plan: Comfort care measures -Order set initiated.  On as needed IV morphine for pain, as needed IV Ativan for anxiety -Pending residential hospice care placement available -First diagnosed with COVID on 2/1.  Remains Covid positive on 2/15  Other issues: Acute metabolic encephalopathy -Sent from SNF for intermittent bouts of confusion, agitation.   -Likely secondary to dehydration, on top of Covid infection.   -TSH normal -CT head did not show any acute intracranial abnormality but showed small vessel chronic ischemic changes.  AKI Metabolic acidosis Hyperkalemia Recent Covid infection COPD Chronic respiratory failure with hypoxia Type 2 diabetes mellitus CAD s/p CABG Peripheral artery disease s/p femoral-popliteal bypass Carotid artery stenosis s/p CEA Hyperlipidemia Sacral wound -POA  Code Status:   Code Status: DNR  Nutritional status: Body mass index is 20.67 kg/m.     Diet Order            DIET - DYS 1 Room service appropriate? No; Fluid consistency: Thin  Diet effective now                 DVT prophylaxis: enoxaparin (LOVENOX) injection 40 mg Start: 04/10/20 1000   Antimicrobials:  None Fluid: Normal saline at 75 mill per hour. Consultants: None Family Communication:  d.w [name], [place] yesterday. Status is: Inpatient  Dispo: The patient is from: SNF  Anticipated d/c is to: Residential hospice              Anticipated d/c date is: Whenever bed is available      Difficult to place patient No       Infusions:    Scheduled Meds: . aspirin  81 mg Oral BID WC  . clopidogrel  75 mg Oral Daily  . collagenase   Topical Daily  . divalproex  125 mg Oral BID  . enoxaparin (LOVENOX) injection  40 mg Subcutaneous Q24H  . fenofibrate   160 mg Oral Daily  . ferrous gluconate  324 mg Oral QAC breakfast  . insulin aspart  0-15 Units Subcutaneous TID AC & HS  . melatonin  6 mg Oral QHS  . metoprolol tartrate  12.5 mg Oral BID  . polyethylene glycol  17 g Oral Daily  . psyllium  1 packet Oral BID  . saccharomyces boulardii  500 mg Oral BID  . simvastatin  20 mg Oral QHS  . traZODone  50 mg Oral QHS    Antimicrobials: Anti-infectives (From admission, onward)   Start     Dose/Rate Route Frequency Ordered Stop   04/10/20 1000  doxycycline (VIBRA-TABS) tablet 100 mg        100 mg Oral 2 times daily 04/10/20 0742 04/12/20 0959      PRN meds: acetaminophen **OR** acetaminophen, hydrALAZINE, ipratropium-albuterol, lip balm, LORazepam **OR** LORazepam **OR** LORazepam, morphine injection, morphine CONCENTRATE **OR** morphine CONCENTRATE, ondansetron **OR** ondansetron (ZOFRAN) IV, polyethylene glycol   Objective: Vitals:   04/12/20 0548 04/12/20 1345  BP: 101/66 (!) 137/57  Pulse: 82 78  Resp:  14  Temp: 97.8 F (36.6 C) 97.6 F (36.4 C)  SpO2: 90% 97%    Intake/Output Summary (Last 24 hours) at 04/12/2020 1533 Last data filed at 04/12/2020 0557 Gross per 24 hour  Intake --  Output 675 ml  Net -675 ml   Filed Weights   04/09/20 2126  Weight: 63.5 kg   Weight change:  Body mass index is 20.67 kg/m.   Physical Exam: General exam: Pleasant elderly Caucasian male.  Not in distress, mumbling Skin: No rashes, lesions or ulcers.  Multiple areas of petechiae and bruises HEENT: Atraumatic, normocephalic, no obvious bleeding Lungs: Clear to auscultation bilaterally CVS: Regular rate and rhythm, no murmur GI/Abd soft, nontender, nondistended, bowel sound present CNS: Alert, awake, oriented to place Psychiatry: Depressed look Extremities: No pedal edema, no calf tenderness  Data Review: I have personally reviewed the laboratory data and studies available.  Recent Labs  Lab 04/09/20 2229 04/11/20 0355  04/12/20 0345  WBC 14.7* 13.5* 14.0*  NEUTROABS 13.3* 12.1*  --   HGB 10.4* 8.1* 7.4*  HCT 34.2* 26.9* 24.1*  MCV 96.6 97.5 94.9  PLT 570* 431* 371   Recent Labs  Lab 04/09/20 2229 04/10/20 0433 04/10/20 1816 04/11/20 0355 04/12/20 0345  NA 135 138 143 144 149*  K 6.2* 5.9* 4.9 4.5 4.4  CL 106 108 115* 116* 120*  CO2 11* 15* 17* 17* 18*  GLUCOSE 150* 151* 141* 180* 151*  BUN 54* 55* 54* 52* 40*  CREATININE 1.29* 1.32* 1.06 0.86 0.79  CALCIUM 8.3* 8.0* 7.7* 8.0* 8.0*  MG  --   --   --  2.7*  --     F/u labs ordered Unresulted Labs (From admission, onward)         None      Signed, Lorin Glass, MD [facility] 04/12/2020

## 2020-04-12 NOTE — Progress Notes (Signed)
SLP Cancellation Note  Patient Details Name: AMAL SAIKI MRN: [id number] DOB: Feb 09, 1941   Cancelled treatment:       Reason Eval/Treat Not Completed: Patient declined, no reason specified; nursing declined; pt transitioned to comfort care; ST will s/o at this time.  Thank you for this consult.   Tressie Stalker, M.S., CCC-SLP 04/12/2020, 2:24 PM

## 2020-04-12 NOTE — Progress Notes (Signed)
   I have reached out and spoke to the POA [place] [facility] about hospice services and the pt goals. He is in agreement with Hospice services and would like to proceed with referral process. He lives in [place]. I have offer both our facilities in [facility] and [place] which have COVID beds. He prefers the Hospice home in [facility].  Unfortunately we do not have a bed to offer the pt in our COVID unit today. WE will continue to follow until bed available. Norm Parcel RN [phone]

## 2020-04-12 NOTE — Plan of Care (Signed)

## 2020-04-12 NOTE — Progress Notes (Signed)
RN had a hard time giving night time PO medications. Pills were crushed and mixed with pudding, pt initially refused to take, but somehow took atleast 50% of the entire mixture.

## 2020-04-13 LAB — GLUCOSE, CAPILLARY: Glucose-Capillary: 120 mg/dL — ABNORMAL HIGH (ref 70–99)

## 2020-04-24 NOTE — Plan of Care (Signed)
  Problem: Education: Goal: Knowledge of General Education information will improve Description: Including pain rating scale, medication(s)/side effects and non-pharmacologic comfort measures Outcome: Adequate for Discharge   Problem: Health Behavior/Discharge Planning: Goal: Ability to manage health-related needs will improve Outcome: Adequate for Discharge   Problem: Clinical Measurements: Goal: Ability to maintain clinical measurements within normal limits will improve Outcome: Adequate for Discharge Goal: Will remain free from infection Outcome: Adequate for Discharge Goal: Diagnostic test results will improve Outcome: Adequate for Discharge Goal: Respiratory complications will improve Outcome: Adequate for Discharge Goal: Cardiovascular complication will be avoided Outcome: Adequate for Discharge   Problem: Activity: Goal: Risk for activity intolerance will decrease Outcome: Adequate for Discharge   Problem: Nutrition: Goal: Adequate nutrition will be maintained Outcome: Adequate for Discharge   Problem: Coping: Goal: Level of anxiety will decrease Outcome: Adequate for Discharge   Problem: Elimination: Goal: Will not experience complications related to bowel motility Outcome: Adequate for Discharge Goal: Will not experience complications related to urinary retention Outcome: Adequate for Discharge   Problem: Pain Managment: Goal: General experience of comfort will improve Outcome: Adequate for Discharge   Problem: Safety: Goal: Ability to remain free from injury will improve Outcome: Adequate for Discharge   Problem: Skin Integrity: Goal: Risk for impaired skin integrity will decrease Outcome: Adequate for Discharge   Problem: Clinical Measurements: Goal: Ability to maintain clinical measurements within normal limits will improve Outcome: Adequate for Discharge Goal: Will remain free from infection Outcome: Adequate for Discharge Goal: Diagnostic test  results will improve Outcome: Adequate for Discharge Goal: Respiratory complications will improve Outcome: Adequate for Discharge Goal: Cardiovascular complication will be avoided Outcome: Adequate for Discharge

## 2020-04-24 NOTE — TOC Transition Note (Signed)
Transition of Care [facility]) - CM/SW Discharge Note   Patient Details  Name: Daniel Tyler MRN: [id number] Date of Birth: 1940/09/18  [facility] CM/SW Contact:  Ida Rogue, LCSW Phone Number: May 03, 2020, 11:14 AM   Clinical Narrative:  Received call from Cheri with [facility] confirming bed for patient today at [facility] facility. MD alerted.  Cheri will alert me when paperwork has been signed, and I will arrange for PTAR transportation at that point.  Nursing, please call report to [phone]. TOC sign off.     Final next level of care: Hospice Medical Facility Barriers to Discharge: Barriers Resolved   Patient Goals and CMS Choice     Choice offered to / list presented to : Adult Children  Discharge Placement                       Discharge Plan and Services     Post Acute Care Choice: Hospice                               Social Determinants of Health (SDOH) Interventions     Readmission Risk Interventions No flowsheet data found.

## 2020-04-24 NOTE — Care Management Important Message (Signed)
Important Message  Patient Details IM Letter placed in Patient's door caddy. Name: Daniel Tyler MRN: [id number] Date of Birth: 07/01/1940   Medicare Important Message Given:  Yes     Caren Macadam April 24, 2020, 10:42 AM

## 2020-04-24 NOTE — Discharge Summary (Signed)
Physician Discharge Summary  Traevon Meiring Rance YQM:[id number] DOB: 1940/09/10 DOA: 04/09/2020  PCP: Dois Davenport, MD  Admit date: 04/09/2020 Discharge date: 04/27/2020 30 Day Unplanned Readmission Risk Score   Flowsheet Row ED to Hosp-Admission (Current) from 04/09/2020 in [facility]  30 Day Unplanned Readmission Risk Score (%) 35.31 Filed at 2020/04/27 0801     This score is the patient's risk of an unplanned readmission within 30 days of being discharged (0 -100%). The score is based on dignosis, age, lab data, medications, orders, and past utilization.   Low:  0-14.9   Medium: 15-21.9   High: 22-29.9   Extreme: 30 and above         Admitted From: SNF Disposition: Hospice facility  Recommendations for Outpatient Follow-up:  1. Follow up with PCP in 1-2 weeks 2. Please obtain BMP/CBC in one week 3. Please follow up with your PCP on the following pending results: Unresulted Labs (From admission, onward)         None        Home Health: None Equipment/Devices: None  Discharge Condition: Poor CODE STATUS: DNR Diet recommendation: Regular  Subjective: Seen and examined.  Patient mumbling.  Looks comfortable.  Not able to have any conversation.  Brief/Interim Summary: WILLEM KLINGENSMITH is a 80 y.o. male with PMH significant for DM2, HLD, CAD s/p CABG, COPD on 3 L oxygen, peripheral artery disease s/p femoral-popliteal bypass, carotid artery stenosis s/p CEA, recent right hip arthroplasty 1/6, who was recently diagnosed with COVID on 2/1 at the SNF, [facility].  He was started on doxycycline. 2/14, EMS was called with complaint of intermittent bouts of agitation, unable to follow commands and  variable oxygen saturations.  EMS did not find any evidence of hypoxia on his baseline 3 L and he did not appear in respiratory distress either. But he was brought to the ED for other nonspecific complaints.  In the ED, patient was  afebrile, tachycardic to 105, blood pressure in low 100s O2 sat 93% on 3 L. Labs showed WBC count elevated to 14.7, hemoglobin low at 10.4, potassium elevated to 6.2, metabolic acidosis with serum bicarb low at 11, BUN/creatinine 54/1.29, lactic acid was elevated to 2.4 EKG without hyperkalemic changes Patient was started on IV hydration, given Lokelma. blood gas showed acidotic pH at 7.23 with PCO2 normal at 42 and bicarbonate level low at 17 Admitted to hospitalist service for further evaluation management.   2/16, my colleague Dr. Pola Corn discussed with patient's friend and power of attorney Mr. Venia Carbon. He knows Mr. Golz for several years. He has seen the patient progressively decline over the months and especially in the last 2 monthssince the hip fracture and Covid infection. He is not excited about the care at [facility] and does not see a hope with the return to the facility. He inquired about residential hospice. Based on patient's current frailty and poor prognosis, decision was made to make patient comfort care and focus on comfort only and transition to hospice facility. Social work consulted for residential hospice placement.  Finally he has a bed available and he is being discharged today to hospice facility.  Assessment/Plan:  Discharge Diagnoses:  Principal Problem:   Acute metabolic encephalopathy Active Problems:   Coronary artery disease involving native heart without angina pectoris   Type 2 diabetes mellitus with complication, without long-term current use of insulin (HCC)   Essential hypertension   COPD (chronic obstructive pulmonary disease)  with emphysema (HCC)   Hyperkalemia   History of COVID-19   Mixed hyperlipidemia due to type 2 diabetes mellitus (HCC)   Hypovolemia due to dehydration   Sacral wound   Chronic respiratory failure with hypoxia (HCC)   Metabolic acidosis    Discharge Instructions   Allergies as of Apr 25, 2020   No Known Allergies      Medication List    STOP taking these medications   doxycycline 100 MG tablet Commonly known as: ADOXA   HYDROcodone-acetaminophen 7.5-325 MG tablet Commonly known as: NORCO     TAKE these medications   acetaminophen 500 MG tablet Commonly known as: TYLENOL Take 1,000 mg by mouth every 4 (four) hours as needed for mild pain.   albuterol 108 (90 Base) MCG/ACT inhaler Commonly known as: VENTOLIN HFA Inhale 2 puffs into the lungs every 6 (six) hours as needed for wheezing or shortness of breath.   aspirin 81 MG chewable tablet Chew 1 tablet (81 mg total) by mouth 2 (two) times daily with a meal. What changed: when to take this   cholecalciferol 25 MCG (1000 UNIT) tablet Commonly known as: VITAMIN D3 Take 1,000 Units by mouth daily.   clopidogrel 75 MG tablet Commonly known as: PLAVIX Take 1 tablet by mouth once daily   divalproex 125 MG capsule Commonly known as: DEPAKOTE SPRINKLE Take 125 mg by mouth 2 (two) times daily.   feeding supplement (PRO-STAT SUGAR FREE 64) Liqd Take 30 mLs by mouth in the morning and at bedtime.   fenofibrate 160 MG tablet Take 160 mg by mouth daily.   ferrous gluconate 324 MG tablet Commonly known as: FERGON Take 324 mg by mouth daily.   glipiZIDE 10 MG 24 hr tablet Commonly known as: GLUCOTROL XL Take 10 mg by mouth daily.   magnesium oxide 400 (241.3 Mg) MG tablet Commonly known as: MAG-OX Take 1 tablet (400 mg total) by mouth 2 (two) times daily.   melatonin 3 MG Tabs tablet Take 6 mg by mouth at bedtime.   metFORMIN 1000 MG tablet Commonly known as: GLUCOPHAGE TAKE ONE TABLET BY MOUTH TWICE DAILY FOR  DIABETES What changed: See the new instructions.   metoprolol tartrate 25 MG tablet Commonly known as: LOPRESSOR Take 12.5 mg by mouth 2 (two) times daily.   polyethylene glycol 17 g packet Commonly known as: MIRALAX / GLYCOLAX Take 17 g by mouth daily.   psyllium 0.52 g capsule Commonly known as: REGULOID Take 1.04 g  by mouth in the morning and at bedtime.   saccharomyces boulardii 250 MG capsule Commonly known as: FLORASTOR Take 500 mg by mouth 2 (two) times daily.   simvastatin 20 MG tablet Commonly known as: ZOCOR Take 20 mg by mouth at bedtime.   traZODone 50 MG tablet Commonly known as: DESYREL Take 50 mg by mouth at bedtime.   vitamin C 500 MG tablet Commonly known as: ASCORBIC ACID Take 1,000 mg by mouth daily.   Zinc Oxide 22 % Crea Apply 1 application topically See admin instructions. Apply to Diaper/groin area once daily And as needed   zinc sulfate 220 (50 Zn) MG capsule Take 220 mg by mouth daily.       Follow-up Information    Dois Davenport, MD Follow up in 1 week(s).   Specialty: Family Medicine Contact information: [address] [place] [address] [phone]              No Known Allergies  Consultations: None  Procedures/Studies: DG Chest 2 View  Result Date: 03/16/2020 CLINICAL DATA:  Altered mental status evaluate for pneumonia EXAM: CHEST - 2 VIEW COMPARISON:  Chest radiograph February 29, 2020 FINDINGS: The heart size and mediastinal contours are unchanged. Prior median sternotomy and CABG. Aortic atherosclerosis. Emphysematous changes in lung apices with scarring in the left mid lung. Hazy bibasilar opacities. No pleural effusion or pneumothorax. The visualized skeletal structures are unchanged. IMPRESSION: Hazy bibasilar opacities, which may represent atelectasis or infiltrate. Electronically Signed   By: Maudry Mayhew MD   On: 03/16/2020 13:32   CT HEAD WO CONTRAST  Result Date: 04/10/2020 CLINICAL DATA:  Delirium, history coronary artery disease post MI, diabetes mellitus, hyperlipidemia, central hypertension EXAM: CT HEAD WITHOUT CONTRAST TECHNIQUE: Contiguous axial images were obtained from the base of the skull through the vertex without intravenous contrast. Sagittal and coronal MPR images reconstructed from axial  data set. COMPARISON:  03/15/2020 FINDINGS: Brain: Scattered motion artifacts despite repeating images, patient uncooperative for imaging generalized atrophy. Normal ventricular morphology. No midline shift or mass effect. Small vessel chronic ischemic changes of deep cerebral white matter. Within limitations of patient motion, no gross evidence of mass, hemorrhage, or infarction. Vascular: Extensive atherosclerotic calcifications of internal carotid arteries at skull base Skull: No gross abnormality Sinuses/Orbits: Clear Other: N/A IMPRESSION: Limited exam due to patient motion. Atrophy with small vessel chronic ischemic changes of deep cerebral white matter. No gross acute intracranial abnormalities identified. Electronically Signed   By: Ulyses Southward [name]   On: 04/10/2020 08:58   CT Head Wo Contrast  Result Date: 03/15/2020 CLINICAL DATA:  Neuro deficit, acute, stroke suspected. EXAM: CT HEAD WITHOUT CONTRAST TECHNIQUE: Contiguous axial images were obtained from the base of the skull through the vertex without intravenous contrast. COMPARISON:  Head CT 07/14/2011. FINDINGS: Brain: Mild-to-moderate cerebral atrophy. Redemonstrated chronic lacunar infarcts within the left basal ganglia. Background mild ill-defined hypoattenuation within the cerebral white matter is nonspecific, but compatible with chronic small vessel ischemic disease. Two small lacunar infarcts within the left pons new as compared to the head CT of 07/14/2011 but otherwise age indeterminate (series 2, images 10 and 11). There is no acute intracranial hemorrhage. No demarcated cortical infarct. No extra-axial fluid collection. No evidence of intracranial mass. No midline shift. Vascular: No hyperdense vessel.  Atherosclerotic calcifications. Skull: Normal. Negative for fracture or focal lesion. Sinuses/Orbits: Visualized orbits show no acute finding. Mild mucosal thickening within the right ethmoid and maxillary sinuses at the imaged levels.  Small bilateral mastoid effusions. IMPRESSION: Two small lacunar infarcts within the left pons are new as compared to the head CT of 07/14/2011 but otherwise age-indeterminate. Brain MRI may be obtained for further evaluation, if clinically warranted. Redemonstrated chronic lacunar infarcts within the left basal ganglia. Mild-to-moderate cerebral atrophy and mild cerebral white matter chronic small vessel ischemic disease. Mild paranasal sinus mucosal thickening. Small bilateral mastoid effusions. Electronically Signed   By: Jackey Loge DO   On: 03/15/2020 17:52   MR BRAIN WO CONTRAST  Result Date: 03/15/2020 CLINICAL DATA:  Initial evaluation for acute TIA, age-indeterminate infarct on prior CT. EXAM: MRI HEAD WITHOUT CONTRAST TECHNIQUE: Multiplanar, multiecho pulse sequences of the brain and surrounding structures were obtained without intravenous contrast. COMPARISON:  Prior CT from earlier the same day. FINDINGS: Brain: Diffuse prominence of the CSF containing spaces compatible with moderately advanced age-related cerebral and cerebellar atrophy. Patchy and confluent T2/FLAIR hyperintensity within the periventricular deep white matter both cerebral hemispheres most consistent with chronic small vessel  ischemic disease, mild in nature. Few scattered remote lacunar infarcts noted about the left basal ganglia and hemispheric cerebral white matter. Subcentimeter symmetric foci of encephalomalacia involving the globus pallidi noted, nonspecific, but could be related to a prior ischemic or other toxic/metabolic insult. No abnormal foci of restricted diffusion to suggest acute or subacute ischemia. Gray-white matter differentiation maintained. No evidence for acute intracranial hemorrhage. Single punctate chronic microhemorrhage noted within the left temporal occipital region, of doubtful significance in isolation. No other evidence for chronic intracranial hemorrhage. No mass lesion, midline shift or mass effect.  No hydrocephalus or extra-axial fluid collection. Pituitary gland suprasellar region within normal limits. Midline structures intact. Vascular: Major intracranial vascular flow voids are maintained. Skull and upper cervical spine: Craniocervical junction within normal limits. Bone marrow signal intensity normal. No scalp soft tissue abnormality. Sinuses/Orbits: Patient status post bilateral ocular lens replacement. Paranasal sinuses are largely clear. Small bilateral mastoid effusions noted, of doubtful significance. Visualized nasopharynx within normal limits. Other: None. IMPRESSION: 1. No acute intracranial abnormality. 2. Subcentimeter symmetric foci of encephalomalacia involving the globus pallidi bilaterally, nonspecific, but could be related to a prior ischemic or other toxic/metabolic insult. 3. Moderately advanced age-related cerebral and cerebellar atrophy, with underlying chronic small vessel ischemic disease. Electronically Signed   By: Rise Mu [name]   On: 03/15/2020 22:29   DG Chest Portable 1 View  Result Date: 04/09/2020 CLINICAL DATA:  Shortness of breath EXAM: PORTABLE CHEST 1 VIEW COMPARISON:  03/16/2020, 07/13/2012 FINDINGS: Hyperinflation with emphysematous disease. Chronic bronchitic changes at the bases. Post sternotomy changes. No consolidation or pleural effusion. Minimal ground-glass opacity at the bases. Stable cardiomediastinal silhouette with aortic atherosclerosis. No pneumothorax IMPRESSION: Hyperinflation with emphysematous disease and chronic bronchitic changes at the bases. Minimal ground-glass opacity at the bases, possible early pneumonia. Electronically Signed   By: Jasmine Pang [name]   On: 04/09/2020 22:55      Discharge Exam: Vitals:   04/12/20 1345 04/22/20 0442  BP: (!) 137/57 (!) 142/47  Pulse: 78 (!) 59  Resp: 14 18  Temp: 97.6 F (36.4 C) 97.8 F (36.6 C)  SpO2: 97% 99%   Vitals:   04/11/20 2046 04/12/20 0548 04/12/20 1345 22-Apr-2020 0442   BP: (!) 132/116 101/66 (!) 137/57 (!) 142/47  Pulse: 98 82 78 (!) 59  Resp:   14 18  Temp:  97.8 F (36.6 C) 97.6 F (36.4 C) 97.8 F (36.6 C)  TempSrc:      SpO2:  90% 97% 99%  Weight:      Height:        General: Pt is weak, lethargic and mumbling but looks comfortable. Cardiovascular: RRR, S1/S2 +, no rubs, no gallops Respiratory: CTA bilaterally, no wheezing, no rhonchi Abdominal: Soft, NT, ND, bowel sounds +    The results of significant diagnostics from this hospitalization (including imaging, microbiology, ancillary and laboratory) are listed below for reference.     Microbiology: Recent Results (from the past 240 hour(s))  Resp Panel by RT-PCR (Flu A&B, Covid) Nasopharyngeal Swab     Status: Abnormal   Collection Time: 04/10/20  5:28 AM   Specimen: Nasopharyngeal Swab; Nasopharyngeal(NP) swabs in vial transport medium  Result Value Ref Range Status   SARS Coronavirus 2 by RT PCR POSITIVE (A) NEGATIVE Final    Comment: RESULT CALLED TO, READ BACK BY AND VERIFIED WITH: BLAIR,I. RN @0821  ON 2.15.2022 BY NMCCOY (NOTE) SARS-CoV-2 target nucleic acids are DETECTED.  The SARS-CoV-2 RNA is generally detectable in upper respiratory  specimens during the acute phase of infection. Positive results are indicative of the presence of the identified virus, but do not rule out bacterial infection or co-infection with other pathogens not detected by the test. Clinical correlation with patient history and other diagnostic information is necessary to determine patient infection status. The expected result is Negative.  Fact Sheet for Patients: BloggerCourse.com  Fact Sheet for Healthcare Providers: SeriousBroker.it  This test is not yet approved or cleared by the [place] FDA and  has been authorized for detection and/or diagnosis of SARS-CoV-2 by FDA under an Emergency Use Authorization (EUA).  This EUA will remain in  effect (meaning this test  can be used) for the duration of  the COVID-19 declaration under Section 564(b)(1) of the Act, 21 U.S.C. section 360bbb-3(b)(1), unless the authorization is terminated or revoked sooner.     Influenza A by PCR NEGATIVE NEGATIVE Final   Influenza B by PCR NEGATIVE NEGATIVE Final    Comment: (NOTE) The Xpert Xpress SARS-CoV-2/FLU/RSV plus assay is intended as an aid in the diagnosis of influenza from Nasopharyngeal swab specimens and should not be used as a sole basis for treatment. Nasal washings and aspirates are unacceptable for Xpert Xpress SARS-CoV-2/FLU/RSV testing.  Fact Sheet for Patients: BloggerCourse.com  Fact Sheet for Healthcare Providers: SeriousBroker.it  This test is not yet approved or cleared by the [place] FDA and has been authorized for detection and/or diagnosis of SARS-CoV-2 by FDA under an Emergency Use Authorization (EUA). This EUA will remain in effect (meaning this test can be used) for the duration of the COVID-19 declaration under Section 564(b)(1) of the Act, 21 U.S.C. section 360bbb-3(b)(1), unless the authorization is terminated or revoked.  Performed at [facility], [address] [place], [place] [address]      Labs: BNP (last 3 results) No results for input(s): BNP in the last 8760 hours. Basic Metabolic Panel: Recent Labs  Lab 04/09/20 2229 04/10/20 0433 04/10/20 1816 04/11/20 0355 04/12/20 0345  NA 135 138 143 144 149*  K 6.2* 5.9* 4.9 4.5 4.4  CL 106 108 115* 116* 120*  CO2 11* 15* 17* 17* 18*  GLUCOSE 150* 151* 141* 180* 151*  BUN 54* 55* 54* 52* 40*  CREATININE 1.29* 1.32* 1.06 0.86 0.79  CALCIUM 8.3* 8.0* 7.7* 8.0* 8.0*  MG  --   --   --  2.7*  --    Liver Function Tests: Recent Labs  Lab 04/11/20 0355  AST 31  ALT 14  ALKPHOS 43  BILITOT 1.0  PROT 5.9*  ALBUMIN 2.6*   No results for input(s): LIPASE, AMYLASE in  the last 168 hours. Recent Labs  Lab 04/10/20 2036  AMMONIA 19   CBC: Recent Labs  Lab 04/09/20 2229 04/11/20 0355 04/12/20 0345  WBC 14.7* 13.5* 14.0*  NEUTROABS 13.3* 12.1*  --   HGB 10.4* 8.1* 7.4*  HCT 34.2* 26.9* 24.1*  MCV 96.6 97.5 94.9  PLT 570* 431* 371   Cardiac Enzymes: No results for input(s): CKTOTAL, CKMB, CKMBINDEX, TROPONINI in the last 168 hours. BNP: Invalid input(s): POCBNP CBG: Recent Labs  Lab 04/12/20 0811 04/12/20 1205 04/12/20 1702 04/12/20 2126 05-06-2020 0746  GLUCAP 150* 151* 156* 158* 120*   D-Dimer No results for input(s): DDIMER in the last 72 hours. Hgb A1c No results for input(s): HGBA1C in the last 72 hours. Lipid Profile No results for input(s): CHOL, HDL, LDLCALC, TRIG, CHOLHDL, LDLDIRECT in the last 72 hours. Thyroid function studies No results  for input(s): TSH, T4TOTAL, T3FREE, THYROIDAB in the last 72 hours.  Invalid input(s): FREET3 Anemia work up No results for input(s): VITAMINB12, FOLATE, FERRITIN, TIBC, IRON, RETICCTPCT in the last 72 hours. Urinalysis    Component Value Date/Time   COLORURINE YELLOW 07/20/2015 2230   APPEARANCEUR CLEAR 07/20/2015 2230   LABSPEC 1.012 07/20/2015 2230   PHURINE 5.0 07/20/2015 2230   GLUCOSEU NEGATIVE 07/20/2015 2230   HGBUR NEGATIVE 07/20/2015 2230   BILIRUBINUR NEGATIVE 07/20/2015 2230   KETONESUR NEGATIVE 07/20/2015 2230   PROTEINUR NEGATIVE 07/20/2015 2230   UROBILINOGEN 1.0 07/13/2012 1445   NITRITE NEGATIVE 07/20/2015 2230   LEUKOCYTESUR NEGATIVE 07/20/2015 2230   Sepsis Labs Invalid input(s): PROCALCITONIN,  WBC,  LACTICIDVEN Microbiology Recent Results (from the past 240 hour(s))  Resp Panel by RT-PCR (Flu A&B, Covid) Nasopharyngeal Swab     Status: Abnormal   Collection Time: 04/10/20  5:28 AM   Specimen: Nasopharyngeal Swab; Nasopharyngeal(NP) swabs in vial transport medium  Result Value Ref Range Status   SARS Coronavirus 2 by RT PCR POSITIVE (A) NEGATIVE Final     Comment: RESULT CALLED TO, READ BACK BY AND VERIFIED WITH: BLAIR,I. RN @0821  ON 2.15.2022 BY NMCCOY (NOTE) SARS-CoV-2 target nucleic acids are DETECTED.  The SARS-CoV-2 RNA is generally detectable in upper respiratory specimens during the acute phase of infection. Positive results are indicative of the presence of the identified virus, but do not rule out bacterial infection or co-infection with other pathogens not detected by the test. Clinical correlation with patient history and other diagnostic information is necessary to determine patient infection status. The expected result is Negative.  Fact Sheet for Patients: BloggerCourse.comhttps://www.fda.gov/media/152166/download  Fact Sheet for Healthcare Providers: SeriousBroker.ithttps://www.fda.gov/media/152162/download  This test is not yet approved or cleared by the [place] FDA and  has been authorized for detection and/or diagnosis of SARS-CoV-2 by FDA under an Emergency Use Authorization (EUA).  This EUA will remain in effect (meaning this test  can be used) for the duration of  the COVID-19 declaration under Section 564(b)(1) of the Act, 21 U.S.C. section 360bbb-3(b)(1), unless the authorization is terminated or revoked sooner.     Influenza A by PCR NEGATIVE NEGATIVE Final   Influenza B by PCR NEGATIVE NEGATIVE Final    Comment: (NOTE) The Xpert Xpress SARS-CoV-2/FLU/RSV plus assay is intended as an aid in the diagnosis of influenza from Nasopharyngeal swab specimens and should not be used as a sole basis for treatment. Nasal washings and aspirates are unacceptable for Xpert Xpress SARS-CoV-2/FLU/RSV testing.  Fact Sheet for Patients: BloggerCourse.comhttps://www.fda.gov/media/152166/download  Fact Sheet for Healthcare Providers: SeriousBroker.ithttps://www.fda.gov/media/152162/download  This test is not yet approved or cleared by the [place] FDA and has been authorized for detection and/or diagnosis of SARS-CoV-2 by FDA under an Emergency Use Authorization (EUA).  This EUA will remain in effect (meaning this test can be used) for the duration of the COVID-19 declaration under Section 564(b)(1) of the Act, 21 U.S.C. section 360bbb-3(b)(1), unless the authorization is terminated or revoked.  Performed at [facility], [address] [place], [place] [id number]      Time coordinating discharge: Over 30 minutes  SIGNED:   Hughie Clossavi Kiera Hussey, MD  [facility] 11/07/20, 10:49 AM  If 7PM-7AM, please contact night-coverage [email or web address]

## 2020-04-24 NOTE — Progress Notes (Signed)
Report given to Joy at [facility] in [facility] at this time.  She requests that patient's IV remain in place.

## 2020-04-24 DEATH — deceased
